# Patient Record
Sex: Female | Born: 1935 | Race: White | Hispanic: No | Marital: Married | Smoking: Never smoker
Health system: Southern US, Community
[De-identification: ages and names within clinical notes are randomized; demographics above are authoritative.]

## PROBLEM LIST (undated history)

## (undated) DIAGNOSIS — I340 Nonrheumatic mitral (valve) insufficiency: Secondary | ICD-10-CM

## (undated) DIAGNOSIS — I7300 Raynaud's syndrome without gangrene: Secondary | ICD-10-CM

## (undated) DIAGNOSIS — K219 Gastro-esophageal reflux disease without esophagitis: Secondary | ICD-10-CM

## (undated) DIAGNOSIS — R55 Syncope and collapse: Secondary | ICD-10-CM

## (undated) DIAGNOSIS — M8580 Other specified disorders of bone density and structure, unspecified site: Secondary | ICD-10-CM

## (undated) DIAGNOSIS — Z9289 Personal history of other medical treatment: Secondary | ICD-10-CM

## (undated) DIAGNOSIS — I4821 Permanent atrial fibrillation: Secondary | ICD-10-CM

## (undated) DIAGNOSIS — I509 Heart failure, unspecified: Secondary | ICD-10-CM

## (undated) DIAGNOSIS — F419 Anxiety disorder, unspecified: Secondary | ICD-10-CM

## (undated) DIAGNOSIS — K279 Peptic ulcer, site unspecified, unspecified as acute or chronic, without hemorrhage or perforation: Secondary | ICD-10-CM

## (undated) DIAGNOSIS — H905 Unspecified sensorineural hearing loss: Secondary | ICD-10-CM

## (undated) DIAGNOSIS — Z87440 Personal history of urinary (tract) infections: Secondary | ICD-10-CM

## (undated) DIAGNOSIS — K922 Gastrointestinal hemorrhage, unspecified: Secondary | ICD-10-CM

## (undated) DIAGNOSIS — I10 Essential (primary) hypertension: Secondary | ICD-10-CM

## (undated) DIAGNOSIS — N3941 Urge incontinence: Secondary | ICD-10-CM

## (undated) DIAGNOSIS — I2510 Atherosclerotic heart disease of native coronary artery without angina pectoris: Secondary | ICD-10-CM

## (undated) DIAGNOSIS — H9319 Tinnitus, unspecified ear: Secondary | ICD-10-CM

## (undated) DIAGNOSIS — H8109 Meniere's disease, unspecified ear: Secondary | ICD-10-CM

## (undated) DIAGNOSIS — K449 Diaphragmatic hernia without obstruction or gangrene: Secondary | ICD-10-CM

## (undated) DIAGNOSIS — E785 Hyperlipidemia, unspecified: Secondary | ICD-10-CM

## (undated) DIAGNOSIS — Z8719 Personal history of other diseases of the digestive system: Secondary | ICD-10-CM

## (undated) DIAGNOSIS — K5190 Ulcerative colitis, unspecified, without complications: Secondary | ICD-10-CM

## (undated) HISTORY — DX: Syncope and collapse: R55

## (undated) HISTORY — DX: Raynaud's syndrome without gangrene: I73.00

## (undated) HISTORY — DX: Ulcerative colitis, unspecified, without complications: K51.90

## (undated) HISTORY — DX: Gastro-esophageal reflux disease without esophagitis: K21.9

## (undated) HISTORY — DX: Tinnitus, unspecified ear: H93.19

## (undated) HISTORY — DX: Personal history of other medical treatment: Z92.89

## (undated) HISTORY — DX: Anxiety disorder, unspecified: F41.9

## (undated) HISTORY — DX: Urge incontinence: N39.41

## (undated) HISTORY — DX: Permanent atrial fibrillation: I48.21

## (undated) HISTORY — DX: Other specified disorders of bone density and structure, unspecified site: M85.80

## (undated) HISTORY — DX: Hyperlipidemia, unspecified: E78.5

## (undated) HISTORY — DX: Personal history of urinary (tract) infections: Z87.440

## (undated) HISTORY — DX: Personal history of other diseases of the digestive system: Z87.19

## (undated) HISTORY — PX: CARDIAC ELECTROPHYSIOLOGY STUDY AND ABLATION: SHX1294

## (undated) HISTORY — DX: Heart failure, unspecified: I50.9

## (undated) HISTORY — DX: Atherosclerotic heart disease of native coronary artery without angina pectoris: I25.10

## (undated) HISTORY — DX: Diaphragmatic hernia without obstruction or gangrene: K44.9

## (undated) HISTORY — DX: Unspecified sensorineural hearing loss: H90.5

## (undated) HISTORY — DX: Meniere's disease, unspecified ear: H81.09

## (undated) HISTORY — PX: MOHS SURGERY: SUR867

## (undated) HISTORY — DX: Peptic ulcer, site unspecified, unspecified as acute or chronic, without hemorrhage or perforation: K27.9

## (undated) HISTORY — DX: Essential (primary) hypertension: I10

## (undated) HISTORY — DX: Gastrointestinal hemorrhage, unspecified: K92.2

## (undated) HISTORY — PX: HEMORRHOID SURGERY: SHX153

## (undated) HISTORY — DX: Nonrheumatic mitral (valve) insufficiency: I34.0

---

## 1971-03-07 HISTORY — PX: APPENDECTOMY: SHX54

## 1971-03-07 HISTORY — PX: VAGINAL HYSTERECTOMY: SUR661

## 2008-03-06 HISTORY — PX: COLONOSCOPY: SHX174

## 2008-03-06 HISTORY — PX: CHOLECYSTECTOMY: SHX55

## 2010-03-06 DIAGNOSIS — K922 Gastrointestinal hemorrhage, unspecified: Secondary | ICD-10-CM

## 2010-03-06 HISTORY — DX: Gastrointestinal hemorrhage, unspecified: K92.2

## 2010-03-06 HISTORY — PX: UPPER GI ENDOSCOPY: SHX6162

## 2010-03-06 HISTORY — PX: LAPAROTOMY: SHX154

## 2012-03-06 HISTORY — PX: TRANSESOPHAGEAL ECHOCARDIOGRAM WITH CARDIOVERSION: SHX6140

## 2012-03-06 HISTORY — PX: CARDIAC CATHETERIZATION: SHX172

## 2012-09-28 ENCOUNTER — Emergency Department: Payer: Self-pay | Admitting: Emergency Medicine

## 2012-09-28 LAB — COMPREHENSIVE METABOLIC PANEL
Albumin: 3.8 g/dL (ref 3.4–5.0)
Alkaline Phosphatase: 71 U/L (ref 50–136)
Anion Gap: 7 (ref 7–16)
BUN: 24 mg/dL — ABNORMAL HIGH (ref 7–18)
Bilirubin,Total: 0.3 mg/dL (ref 0.2–1.0)
Calcium, Total: 8.8 mg/dL (ref 8.5–10.1)
Chloride: 109 mmol/L — ABNORMAL HIGH (ref 98–107)
Co2: 22 mmol/L (ref 21–32)
Creatinine: 1.55 mg/dL — ABNORMAL HIGH (ref 0.60–1.30)
EGFR (African American): 37 — ABNORMAL LOW
EGFR (Non-African Amer.): 32 — ABNORMAL LOW
Glucose: 132 mg/dL — ABNORMAL HIGH (ref 65–99)
Osmolality: 282 (ref 275–301)
Potassium: 4.4 mmol/L (ref 3.5–5.1)
SGOT(AST): 14 U/L — ABNORMAL LOW (ref 15–37)
SGPT (ALT): 22 U/L (ref 12–78)
Sodium: 138 mmol/L (ref 136–145)
Total Protein: 6.9 g/dL (ref 6.4–8.2)

## 2012-09-28 LAB — CBC
HCT: 33.3 % — ABNORMAL LOW (ref 35.0–47.0)
HGB: 10.6 g/dL — ABNORMAL LOW (ref 12.0–16.0)
MCH: 25.8 pg — ABNORMAL LOW (ref 26.0–34.0)
MCHC: 31.8 g/dL — ABNORMAL LOW (ref 32.0–36.0)
MCV: 81 fL (ref 80–100)
Platelet: 209 10*3/uL (ref 150–440)
RBC: 4.12 10*6/uL (ref 3.80–5.20)
RDW: 15.6 % — ABNORMAL HIGH (ref 11.5–14.5)
WBC: 7.1 10*3/uL (ref 3.6–11.0)

## 2012-09-28 LAB — PRO B NATRIURETIC PEPTIDE: B-Type Natriuretic Peptide: 4863 pg/mL — ABNORMAL HIGH (ref 0–450)

## 2012-09-28 LAB — URINALYSIS, COMPLETE
Bacteria: NONE SEEN
Bilirubin,UR: NEGATIVE
Blood: NEGATIVE
Glucose,UR: NEGATIVE mg/dL (ref 0–75)
Ketone: NEGATIVE
Leukocyte Esterase: NEGATIVE
Nitrite: NEGATIVE
Ph: 5 (ref 4.5–8.0)
Protein: NEGATIVE
RBC,UR: <1 /HPF (ref 0–5)
Specific Gravity: 1.003 (ref 1.003–1.030)
Squamous Epithelial: <1
WBC UR: NONE SEEN /HPF (ref 0–5)

## 2012-09-28 LAB — TROPONIN I: Troponin-I: <0.02 ng/mL

## 2013-07-25 ENCOUNTER — Encounter (INDEPENDENT_AMBULATORY_CARE_PROVIDER_SITE_OTHER): Payer: Self-pay

## 2013-07-25 ENCOUNTER — Encounter: Payer: Self-pay | Admitting: Cardiovascular Disease

## 2013-07-25 ENCOUNTER — Ambulatory Visit (INDEPENDENT_AMBULATORY_CARE_PROVIDER_SITE_OTHER): Payer: Medicare Other | Admitting: Cardiovascular Disease

## 2013-07-25 VITALS — BP 130/82 | HR 110 | Ht 68.5 in | Wt 148.0 lb

## 2013-07-25 DIAGNOSIS — Z9889 Other specified postprocedural states: Secondary | ICD-10-CM

## 2013-07-25 DIAGNOSIS — I509 Heart failure, unspecified: Secondary | ICD-10-CM

## 2013-07-25 DIAGNOSIS — I499 Cardiac arrhythmia, unspecified: Secondary | ICD-10-CM

## 2013-07-25 DIAGNOSIS — R0609 Other forms of dyspnea: Secondary | ICD-10-CM | POA: Insufficient documentation

## 2013-07-25 DIAGNOSIS — R0602 Shortness of breath: Secondary | ICD-10-CM

## 2013-07-25 DIAGNOSIS — Z8679 Personal history of other diseases of the circulatory system: Secondary | ICD-10-CM

## 2013-07-25 DIAGNOSIS — I498 Other specified cardiac arrhythmias: Secondary | ICD-10-CM | POA: Insufficient documentation

## 2013-07-25 DIAGNOSIS — I4891 Unspecified atrial fibrillation: Secondary | ICD-10-CM

## 2013-07-25 DIAGNOSIS — Z7901 Long term (current) use of anticoagulants: Secondary | ICD-10-CM

## 2013-07-25 DIAGNOSIS — I5032 Chronic diastolic (congestive) heart failure: Secondary | ICD-10-CM | POA: Insufficient documentation

## 2013-07-25 DIAGNOSIS — R0600 Dyspnea, unspecified: Secondary | ICD-10-CM

## 2013-07-25 MED ORDER — DILTIAZEM HCL 30 MG PO TABS: 30.0000 mg | ORAL_TABLET | Freq: Four times a day (QID) | ORAL | Status: DC | PRN

## 2013-07-25 MED ORDER — FUROSEMIDE 20 MG PO TABS: 20.0000 mg | ORAL_TABLET | Freq: Every day | ORAL | Status: DC | PRN

## 2013-07-25 NOTE — Assessment & Plan Note (Signed)
Prior ablation in October 2014

## 2013-07-25 NOTE — Progress Notes (Signed)
Patient ID: Sierra Kaiser, female    DOB: 27-Jan-1934, 80 y.o.   MRN: [id number]  HPI Comments: [name] is a 80 year old woman with a long history of atrial fibrillation, previously followed at [facility], presenting to establish care in the [place] office. She reports a long history of atrial fibrillation, trial on numerous medications. She has been on Tikosyn the past 3 years or so. Started 2012. Previous to that she was on Multaq.  She had atrial fibrillation ablation in October 2014.  Since that time, she has continued to have episodes of tachycardia arrhythmia, shortness of breath, palpitations. She takes metoprolol twice a day and her tikosyn. She was told to take extra metoprolol as needed for palpitations.  She does report remote history of syncope in 2013. Second episode March 2015 while she was out at dinner. She feels that these episodes were from low blood pressure. She has frequent episodes of shortness of breath particularly with exertion. She takes extra Lasix several days per week for shortness of breath symptoms  Lab work from [facility] 09/28/2012 showing creatinine 1.55, BUN 24 BNP 4800 EKG 09/28/2012 showing atrial fibrillation with RVR, rate 137 beats per minute followup EKG showing normal sinus rhythm with rate 82 beats per minute   Echocardiogram January 2011 was essentially normal. Repeat echocardiogram September 2014 again with normal LV function, normal study  Cardioversion may 2012 Cardioversion 12/06/2012 cardiac cath 05/17/2007 showing no significant CAD Stress test September 2014 showing no significant ischemia, normal ejection fraction EKG on today's visit showing normal sinus rhythm with frequent APCs, rare PVC    Outpatient Encounter Prescriptions as of 07/25/2013  Medication Sig  . Cyanocobalamin (VITAMIN B 12 PO) Take 1,000 mcg by mouth daily.  Marland Kitchen dofetilide (TIKOSYN) 250 MCG capsule Tae 250 mcg by mouth every 12 (twelve) hours.  . furosemide  (LASIX) 20 MG tablet Take 1 tablet (20 mg total) by mouth daily as needed.  . metoprolol succinate (TOPROL-XL) 25 MG 24 hr tablet Take 12.5 mg by mouth 2 (two) times daily.  Marland Kitchen omeprazole (PRILOSEC) 20 MG capsule Take 20 mg by mouth daily.  . Rivaroxaban (XARELTO) 15 MG TABS tablet Take 15 mg by mouth daily with supper.     Review of Systems  Constitutional: Negative.   HENT: Negative.   Eyes: Negative.   Respiratory: Positive for shortness of breath.   Cardiovascular: Positive for palpitations.  Gastrointestinal: Negative.   Endocrine: Negative.   Musculoskeletal: Negative.   Skin: Negative.   Allergic/Immunologic: Negative.   Neurological: Negative.   Hematological: Negative.   Psychiatric/Behavioral: Negative.   All other systems reviewed and are negative.   BP 130/82  Pulse 110  Ht 5' 8.5" (1.74 m)  Wt 148 lb (67.132 kg)  BMI 22.17 kg/m2  Physical Exam  Nursing note and vitals reviewed. Constitutional: She is oriented to person, place, and time. She appears well-developed and well-nourished.  HENT:  Head: Normocephalic.  Nose: Nose normal.  Mouth/Throat: Oropharynx is clear and moist.  Eyes: Conjunctivae are normal. Pupils are equal, round, and reactive to light.  Neck: Normal range of motion. Neck supple. No JVD present.  Cardiovascular: Normal rate, S1 normal, S2 normal, normal heart sounds and intact distal pulses.  An irregularly irregular rhythm present. Exam reveals no gallop and no friction rub.   No murmur heard. Pulmonary/Chest: Effort normal and breath sounds normal. No respiratory distress. She has no wheezes. She has no rales. She exhibits no tenderness.  Abdominal: Soft. Bowel sounds are  normal. She exhibits no distension. There is no tenderness.  Musculoskeletal: Normal range of motion. She exhibits no edema and no tenderness.  Lymphadenopathy:    She has no cervical adenopathy.  Neurological: She is alert and oriented to person, place, and time.  Coordination normal.  Skin: Skin is warm and dry. No rash noted. No erythema.  Psychiatric: She has a normal mood and affect. Her behavior is normal. Judgment and thought content normal.    Assessment and Plan

## 2013-07-25 NOTE — Assessment & Plan Note (Signed)
Tolerating anticoagulation with no side effects. No recent bleeding

## 2013-07-25 NOTE — Assessment & Plan Note (Signed)
Prior echocardiogram showing normal right ventricular systolic pressure. Normal ejection fraction. Suspect she could be overusing her Lasix for shortness of breath, we'll focus on heart rate control. Suggested she try to avoid Lasix for now

## 2013-07-25 NOTE — Assessment & Plan Note (Signed)
Long history of atrial fibrillation, ablation. We'll add diltiazem to her regimen. She reports that this worked well for her

## 2013-07-25 NOTE — Assessment & Plan Note (Signed)
Significant ectopy on today's visit. She is concerned about increasing her metoprolol higher given prior syncope episodes. We will start her on diltiazem 30 mg 3 times a day, continue low-dose metoprolol twice a day.

## 2013-07-25 NOTE — Assessment & Plan Note (Signed)
I suspect her shortness of breath could be from episodes of atrial fibrillation versus atrial tachycardia arrhythmia with rapid rate. Encouraged her to take extra diltiazem as needed for shortness of breath, avoid Lasix given prior syncope and renal dysfunction

## 2013-07-25 NOTE — Patient Instructions (Addendum)
Please start diltiazem three times a day COntinue metoprolol  Take extra diltiazem for tachycardia and shortness of breath  Monitor your heart rate and blood pressure  Try to avoid the lasix  Please call us if you have new issues that need to be addressed before your next appt.  Your physician wants you to follow-up in: 1 month We will set up an appt  with Dr. [name]

## 2013-07-26 ENCOUNTER — Telehealth: Payer: Self-pay | Admitting: Adult Health

## 2013-07-26 NOTE — Telephone Encounter (Signed)
Sierra Kaiser is a 80 year old female patient of Dr. Sherlean Foot seen in the [place] office. She was last seen in the office on 07/25/2013, with an elevated heart rate of 110 beats per minute. She has a history of atrial fibrillation, status post ablation of atrial fibrillation, on chronic anticoagulation, and diastolic heart failure.  On the office visit, the patient was started on diltiazem 30 mg every 6 hours as she did not wish to increase metoprolol as she had had episodes of syncope prior to this on higher doses. The patient has taken diltiazem as directed and has been experiencing itching of her hands on the palms and on the dorsal side. With what appears to be a rash. She took Benadryl, and also a anti-itch cream was applied for hands. She has continued complaints of this each time she takes diltiazem.  I asked her to stop taking the diltiazem. And increase her metoprolol to 25 mg twice a day (from 12.5 mg twice a day). She will take one dose this evening, and begin twice a day dosing tomorrow stopping diltiazem.  The patient has a followup with Dr. Sherlean Foot in one month and a previously scheduled appointment with Dr. [name] on 08/29/2013. She is to call if she has any recurrent symptoms, to include syncope which she states she experienced a beta blocker in the past. She appears to be tolerating the 12.5 mg twice a day now. She states her heart rate has been in the 90s to 100s on diltiazem .  She is to call back if she has any further problems. She verbalizes understanding.

## 2013-07-30 MED ORDER — VERAPAMIL HCL 40 MG PO TABS: 40.0000 mg | ORAL_TABLET | Freq: Three times a day (TID) | ORAL | Status: DC

## 2013-07-30 NOTE — Telephone Encounter (Signed)
Would try an alternate medication Stop the diltiazem Start verapamil 40 mg 3 times a day Continue low-dose metoprolol as she was taking previously

## 2013-07-30 NOTE — Addendum Note (Signed)
Addended by: Stana Bunting on: 07/30/2013 08:36 AM   Modules accepted: Orders, Medications

## 2013-07-30 NOTE — Telephone Encounter (Signed)
Spoke w/ pt.  Advised her of Dr. Gollan's recommendation. She is agreeable and will call w/ further questions or concerns.  

## 2013-08-29 ENCOUNTER — Encounter: Payer: Self-pay | Admitting: Cardiovascular Disease

## 2013-08-29 ENCOUNTER — Ambulatory Visit (INDEPENDENT_AMBULATORY_CARE_PROVIDER_SITE_OTHER): Payer: Medicare Other | Admitting: Cardiovascular Disease

## 2013-08-29 VITALS — BP 112/62 | HR 79 | Ht 68.0 in | Wt 148.5 lb

## 2013-08-29 DIAGNOSIS — I509 Heart failure, unspecified: Secondary | ICD-10-CM

## 2013-08-29 DIAGNOSIS — I4891 Unspecified atrial fibrillation: Secondary | ICD-10-CM

## 2013-08-29 DIAGNOSIS — I5032 Chronic diastolic (congestive) heart failure: Secondary | ICD-10-CM

## 2013-08-29 DIAGNOSIS — Z7901 Long term (current) use of anticoagulants: Secondary | ICD-10-CM

## 2013-08-29 NOTE — Assessment & Plan Note (Addendum)
Tolerating anticoagulation with no signs of bleeding

## 2013-08-29 NOTE — Assessment & Plan Note (Addendum)
Symptoms do not suggest recurrent atrial fibrillation. Long discussion about her medications. She would like to stop her Tikosyn. We have recommended if she does this that she continue on One-A-Day with slow wean down to none. Would continue on metoprolol and verapamil. Suggested she call us for recurrent atrial fibrillation

## 2013-08-29 NOTE — Progress Notes (Signed)
Patient ID: Sierra Kaiser, female    DOB: [birth year], 80 y.o.   MRN: [id number]  HPI Comments: Sierra Kaiser is a 80 year old woman with a long history of atrial fibrillation, previously followed at [facility],   long history of atrial fibrillation, trial on numerous medications. She has been on Tikosyn the past 3 years or so. Started 2012. Previous to that she was on Multaq.  She had atrial fibrillation ablation in October 2014. Previous episodes of syncope in 2013, March 2015. Possibly from low blood pressure  On her last clinic visit, verapamil 40 mg twice a day was started for tachycardia, palpitations, episodes of shortness of breath. Continued on metoprolol and tikosyn.  Since then, she reports her symptoms have significantly improved. She was unable to tolerate verapamil 3 pills per day as this dropped her blood pressure. In fact in review of her blood pressures, she has frequent systolic pressures in the 90s. Often feels lightheaded, fatigued. For the most part she is active, works in her garden. She does have poor sleep  She has frequent episodes of shortness of breath particularly with exertion. She takes extra Lasix several days per week for shortness of breath symptoms  Lab work from [facility] 09/28/2012 showing creatinine 1.55, BUN 24 BNP 4800 EKG 09/28/2012 showing atrial fibrillation with RVR, rate 137 beats per minute followup EKG showing normal sinus rhythm with rate 82 beats per minute   Echocardiogram January 2011 was essentially normal. Repeat echocardiogram September 2014 again with normal LV function, normal study  Cardioversion may 2012 Cardioversion 12/06/2012 cardiac cath 05/17/2007 showing no significant CAD Stress test September 2014 showing no significant ischemia, normal ejection fraction EKG on today's visit showing normal sinus rhythm with rate 79 beats per minute, with frequent APCs, rare PVC    Outpatient Encounter Prescriptions as of 08/29/2013  Medication  Sig  . Cyanocobalamin (VITAMIN B 12 PO) Take 1,000 mcg by mouth daily.  Marland Kitchen dofetilide (TIKOSYN) 250 MCG capsule Take 250 mcg by mouth every 12 (twelve) hours.  . furosemide (LASIX) 20 MG tablet Take 1 tablet (20 mg total) by mouth daily as needed.  . metoprolol succinate (TOPROL-XL) 25 MG 24 hr tablet Take 12.5 mg by mouth 2 (two) times daily.  Marland Kitchen omeprazole (PRILOSEC) 20 MG capsule Take 20 mg by mouth daily.  . Rivaroxaban (XARELTO) 15 MG TABS tablet Take 15 mg by mouth daily with supper.   . verapamil (CALAN) 40 MG tablet Take 40 mg by mouth 2 (two) times daily.  . [DISCONTINUED] verapamil (CALAN) 40 MG tablet Take 1 tablet (40 mg total) by mouth 3 (three) times daily.    Review of Systems  Constitutional: Negative.   HENT: Negative.   Eyes: Negative.   Respiratory: Negative.   Cardiovascular: Negative.   Gastrointestinal: Negative.   Endocrine: Negative.   Musculoskeletal: Negative.   Skin: Negative.   Allergic/Immunologic: Negative.   Neurological: Positive for light-headedness.  Hematological: Negative.   Psychiatric/Behavioral: Negative.   All other systems reviewed and are negative.   BP 112/62  Pulse 79  Ht 5\' 8"  (1.727 m)  Wt 148 lb 8 oz (67.359 kg)  BMI 22.58 kg/m2  Physical Exam  Nursing note and vitals reviewed. Constitutional: She is oriented to person, place, and time. She appears well-developed and well-nourished.  HENT:  Head: Normocephalic.  Nose: Nose normal.  Mouth/Throat: Oropharynx is clear and moist.  Eyes: Conjunctivae are normal. Pupils are equal, round, and reactive to light.  Neck: Normal range of motion.  Neck supple. No JVD present.  Cardiovascular: Normal rate, S1 normal, S2 normal, normal heart sounds and intact distal pulses.  An irregularly irregular rhythm present. Exam reveals no gallop and no friction rub.   No murmur heard. Pulmonary/Chest: Effort normal and breath sounds normal. No respiratory distress. She has no wheezes. She has no  rales. She exhibits no tenderness.  Abdominal: Soft. Bowel sounds are normal. She exhibits no distension. There is no tenderness.  Musculoskeletal: Normal range of motion. She exhibits no edema and no tenderness.  Lymphadenopathy:    She has no cervical adenopathy.  Neurological: She is alert and oriented to person, place, and time. Coordination normal.  Skin: Skin is warm and dry. No rash noted. No erythema.  Psychiatric: She has a normal mood and affect. Her behavior is normal. Judgment and thought content normal.    Assessment and Plan

## 2013-08-29 NOTE — Patient Instructions (Signed)
You are doing well.  Ok to hold tykosin if you would like (do a slow wean) Monitor your rhythm Call if you convert to atrial fibrillation  Please call us if you have new issues that need to be addressed before your next appt.  Your physician wants you to follow-up in: 3 months.  You will receive a reminder letter in the mail two months in advance. If you don't receive a letter, please call our office to schedule the follow-up appointment.

## 2013-08-29 NOTE — Assessment & Plan Note (Signed)
She appears to be relatively euvolemic, in fact with her low blood pressure encouraged her to increase her fluid intake. Frequent systolic pressures in the 90s

## 2013-09-04 ENCOUNTER — Other Ambulatory Visit: Payer: Self-pay

## 2013-09-04 MED ORDER — VERAPAMIL HCL 40 MG PO TABS: 40.0000 mg | ORAL_TABLET | Freq: Two times a day (BID) | ORAL | Status: DC

## 2013-09-12 ENCOUNTER — Telehealth: Payer: Self-pay | Admitting: *Deleted

## 2013-09-12 NOTE — Telephone Encounter (Signed)
Left message for pt to call back  °

## 2013-09-12 NOTE — Telephone Encounter (Signed)
Please call patient. Medication (derapamil)  was cut back and she is sob and out of rhythm. Does she need to add more of her medication?

## 2013-09-18 NOTE — Telephone Encounter (Signed)
Spoke w/ Sierra Kaiser.  She states that she did not get my message until Monday and has not had a chance to call me back.  She reports that she is out of rhythm all week, but her HR is wnl. She reports that she feels nervous, weak and unsure of herself.  Denies CP or SOB.  She would to know if she should have a referral to Dr. [name]. Advised her that she has an appt w/ Dr. [name] on 10/07/13.  She is agreeable and will keep this appt.  Sierra Kaiser to call w/ further questions of concerns before that time.

## 2013-10-07 ENCOUNTER — Encounter: Payer: Self-pay | Admitting: Internal Medicine

## 2013-10-07 ENCOUNTER — Ambulatory Visit (INDEPENDENT_AMBULATORY_CARE_PROVIDER_SITE_OTHER): Payer: Medicare Other | Admitting: Internal Medicine

## 2013-10-07 VITALS — BP 108/80 | HR 115 | Ht 68.0 in | Wt 147.5 lb

## 2013-10-07 DIAGNOSIS — I4891 Unspecified atrial fibrillation: Secondary | ICD-10-CM

## 2013-10-07 MED ORDER — DIGOXIN 125 MCG PO TABS: 0.1250 mg | ORAL_TABLET | Freq: Every day | ORAL | Status: DC

## 2013-10-07 NOTE — Progress Notes (Signed)
ELECTROPHYSIOLOGY CONSULT NOTE  Patient ID: Sierra Kaiser, MRN: [id number], DOB/AGE: 11-30-1933 80 y.o. Admit date: (Not on file) Date of Consult: 10/07/2013  Primary Physician: Bari Edward, MD Primary Cardiologist: *TG  Chief Complaint: ATRIAL FIBRILLATION   HPI Sierra Kaiser is a 80 y.o. female  Referred because of long-standing problem of atrial fibrillation previously followed at [facility]. She actually underwent catheter ablation October 2014. At that time LV function was mildly decreased at 50%. She had moderate MR. She had recurrent atrial fibrillation following the ablation. She is not been seen at [facility] since January 2015. She has been on dofetilide for some time both before and after ablation. She had previously failed dronaderone.  She does not recall any other antiarrhythmic therapy. Her are  She has continued to have problems with tachycardia palpitations; beta blockers were used in conjunction with her dofetilide; recent attempts to add verapamil more challenging because of hypotension. It should be noted that she's had a history of recurrent syncope attributed to low blood pressure.   She has significant problems with exercise intolerance which are variable. It is not clear to her that we associate with her episodes of detected rapid heart rate although she notes her heart rates just once a day and at least today for example she is in regular rhythm the morning in atrial fibrillation later.  She has not had peripheral edema; she does not have nocturnal dyspnea.  Thromboembolic risk factors are notable for age and gender. She is on Rivaroxaban  Apparently dosed for her renal function one year ago   ; catheterization 2009 demonstrated no significant coronary disease. Stress testing 2014 demonstrated no ischemia.  Past Medical History  Diagnosis Date  . A-fib   . Arrhythmia     A-Fib  . CHF (congestive heart failure)   . Hypertension   . Hyperlipidemia   . Raynaud's  disease   . Meniere's disease   . Diverticulitis large intestine   . GI bleeding   . Anxiety   . Urinary incontinence, urge   . Bradycardia   . Colitis   . CKD (chronic kidney disease)   . SNHL (sensorineural hearing loss)   . Tinnitus       Surgical History:  Past Surgical History  Procedure Laterality Date  . Cardiac electrophysiology study and ablation    . Transesophageal echocardiogram with cardioversion      [facility]  . Gallbladder surgery    . Vaginal hysterectomy    . Hemorrhoid surgery    . Upper gi endoscopy    . Appendectomy    . Cardiac catheterization  2014    [facility]     Home Meds: Prior to Admission medications   Medication Sig Start Date End Date Taking? Authorizing Provider  furosemide (LASIX) 20 MG tablet Take 1 tablet (20 mg total) by mouth daily as needed. 07/25/13  Yes Minna Merritts, MD  metoprolol succinate (TOPROL-XL) 25 MG 24 hr tablet Take 12.5 mg by mouth 2 (two) times daily.   Yes Historical Provider, MD  omeprazole (PRILOSEC) 20 MG capsule Take 20 mg by mouth as needed.    Yes Historical Provider, MD  Rivaroxaban (XARELTO) 15 MG TABS tablet Take 15 mg by mouth daily with supper.    Yes Historical Provider, MD  verapamil (CALAN) 40 MG tablet Take 1 tablet (40 mg total) by mouth 2 (two) times daily. 09/04/13  Yes Minna Merritts, MD  Cyanocobalamin (VITAMIN B 12 PO) Take 1,000 mcg by mouth  daily.    Historical Provider, MD    Inpatient Medications:    Allergies:  Allergies  Allergen Reactions  . Ciprofloxacin   . Ivp Dye [Iodinated Diagnostic Agents]     Hives    . Metronidazole     History   Social History  . Marital Status: Married    Spouse Name: N/A    Number of Children: N/A  . Years of Education: N/A   Occupational History  . Not on file.   Social History Main Topics  . Smoking status: Never Smoker   . Smokeless tobacco: Not on file  . Alcohol Use: 1.2 oz/week    2 Glasses of wine per week  . Drug Use: No  . Sexual Activity:  Not on file   Other Topics Concern  . Not on file   Social History Narrative  . No narrative on file     Family History  Problem Relation Age of Onset  . Arrhythmia Mother   . Hypertension Mother   . Heart failure Brother   . Heart failure Brother      ROS:  Please see the history of present illness.     All other systems reviewed and negative.    Physical Exam: Blood pressure 108/80, pulse 115, height 5\' 8"  (1.727 m), weight 147 lb 8 oz (66.906 kg). General: Well developed, well nourished female in no acute distress. Head: Normocephalic, atraumatic, sclera non-icteric, no xanthomas, nares are without discharge. EENT: normal Lymph Nodes:  none Back: without scoliosis/kyphosis , no CVA tendersness Neck: Negative for carotid bruits. JVD not elevated.  Lungs: Clear bilaterally to auscultation without wheezes, rales, or rhonchi. Breathing is unlabored. Heart:rapid and irregularly irregular rhythm without murmur , rubs, or gallops appreciated. Abdomen: Soft, non-tender, non-distended with normoactive bowel sounds. No hepatomegaly. No rebound/guarding. No obvious abdominal masses. Msk:  Strength and tone appear normal for age. Extremities: No clubbing or cyanosis. No  edema.  Distal pedal pulses are 2+ and equal bilaterally. Skin: Warm and Dry Neuro: Alert and oriented X 3. CN III-XII intact Grossly normal sensory and motor function . Psych:  Responds to questions appropriately with a normal affect.      Labs: Cardiac Enzymes No results found for this basename: CKTOTAL, CKMB, TROPONINI,  in the last 72 hours  Radiology/Studies:  No results found.  EKG: atrial fibrillation 114 Intervals-/09/32  Assessment and Plan: *  Atrial fibrillation-rapid ventricular response  HFpEF  Renal insufficiency grade 3  Mitral regurgitation-moderate  Cardiomyopathy-mild  Hypotension  The patient has persistent problems with rapid atrial fibrillation. She has some paroxysms of  sinus rhythm. She had, in the fall, evidence of some degree of cardiomyopathy; this may well have progressed and not dictate a more conclusive approach to her problem.  We spent a long time today discussing strategies of rate control versus rhythm control. We'll undertake an echo to look at left ventricular function and left atrial size. Thereafter we will gather to discuss the use of amiodarone as a rhythm controlling agent were to consider AV junction ablation and pacing or alternatively a repeat PVI  Her anticoagulant does appropriately dosed for renal function.  She was noted to have mitral regurgitation on her last echo; we will reassess this at this time   Virl Axe

## 2013-10-07 NOTE — Patient Instructions (Addendum)
Your physician has requested that you have an echocardiogram. Echocardiography is a painless test that uses sound waves to create images of your heart. It provides your doctor with information about the size and shape of your heart and how well your heart's chambers and valves are working. This procedure takes approximately one hour. There are no restrictions for this procedure.   Your physician recommends that you schedule a follow-up appointment in:  Dr. [name] on 10/21/13  Your physician has recommended you make the following change in your medication:  Start Digoxin 0.125 mg once daily  Do not adjust your Metoprolol dose   Your physician recommends that you have labs today: BMP

## 2013-10-08 LAB — BASIC METABOLIC PANEL
BUN/Creatinine Ratio: 19 (ref 11–26)
BUN: 25 mg/dL (ref 8–27)
CO2: 23 mmol/L (ref 18–29)
Calcium: 10.1 mg/dL (ref 8.7–10.3)
Chloride: 105 mmol/L (ref 97–108)
Creatinine, Ser: 1.29 mg/dL — ABNORMAL HIGH (ref 0.57–1.00)
GFR calc Af Amer: 46 mL/min/{1.73_m2} — ABNORMAL LOW (ref >59)
GFR calc non Af Amer: 40 mL/min/{1.73_m2} — ABNORMAL LOW (ref >59)
Glucose: 86 mg/dL (ref 65–99)
Potassium: 4.9 mmol/L (ref 3.5–5.2)
Sodium: 142 mmol/L (ref 134–144)

## 2013-10-14 ENCOUNTER — Other Ambulatory Visit (INDEPENDENT_AMBULATORY_CARE_PROVIDER_SITE_OTHER): Payer: Medicare Other

## 2013-10-14 ENCOUNTER — Other Ambulatory Visit: Payer: Self-pay

## 2013-10-14 DIAGNOSIS — I4891 Unspecified atrial fibrillation: Secondary | ICD-10-CM

## 2013-10-14 DIAGNOSIS — I059 Rheumatic mitral valve disease, unspecified: Secondary | ICD-10-CM

## 2013-10-17 ENCOUNTER — Telehealth: Payer: Self-pay | Admitting: *Deleted

## 2013-10-17 NOTE — Telephone Encounter (Signed)
pt notified lab results, verbal understanding. Pt then states c/o dizziness, presyncope, weakness, shakey, sob on days when she takes digoxin 0.125 mg. Pt was on digoxin daily, said Dr. [name] changed to QOD. Chest tight yesterday. Will have Triage cb pt.

## 2013-10-17 NOTE — Telephone Encounter (Signed)
pt states BP running low, 8/12 9:15 am 89/48 HR 62; 8/12 5 pm 83/53 HR 81 today 8/14 86/56 HR 56.

## 2013-10-17 NOTE — Telephone Encounter (Signed)
**Note De-Identified  Obfuscation** The pt is advised that due to her s/s, chest tightness and low BP she needs to call 911 or have someone drive her to the ER. The pt states that  she is feeling much better now since she sat down and has been resting. She has an appointment scheduled with Dr [name] on 8/18 and she wants to wait and see him. I again strongly encouraged her to call 911 but she states that if she starts to feel bad enough she will go to the ER but otherwise she will wait to see Dr [name] on Tuesday as she states she is feeling better. Will forward note to Dr [name] as Juluis Rainier.

## 2013-10-17 NOTE — Telephone Encounter (Signed)
lmom to advise pt to go to the ED due to BP too low along with other symptoms of dizziness, shakey, sob, weakness.

## 2013-10-21 ENCOUNTER — Ambulatory Visit (INDEPENDENT_AMBULATORY_CARE_PROVIDER_SITE_OTHER): Payer: Medicare Other | Admitting: Internal Medicine

## 2013-10-21 ENCOUNTER — Encounter: Payer: Self-pay | Admitting: Internal Medicine

## 2013-10-21 VITALS — BP 100/60 | HR 78 | Ht 68.0 in | Wt 149.2 lb

## 2013-10-21 DIAGNOSIS — I4891 Unspecified atrial fibrillation: Secondary | ICD-10-CM

## 2013-10-21 DIAGNOSIS — I4819 Other persistent atrial fibrillation: Secondary | ICD-10-CM

## 2013-10-21 MED ORDER — AMIODARONE HCL 200 MG PO TABS: ORAL_TABLET | ORAL | Status: DC

## 2013-10-21 NOTE — Patient Instructions (Addendum)
Your physician has recommended you make the following change in your medication:  Stop Metoprolol  Stop Verapamil  Start Amiodarone as ordered   Your physician has recommended that you have a Cardioversion (DCCV) we will call you with date and time. Electrical Cardioversion uses a jolt of electricity to your heart either through paddles or wired patches attached to your chest. This is a controlled, usually prescheduled, procedure. Defibrillation is done under light anesthesia in the hospital, and you usually go home the day of the procedure. This is done to get your heart back into a normal rhythm. You are not awake for the procedure. Please see the instruction sheet given to you today.  Your physician wants you to follow-up in: 10-12 weeks. You will receive a reminder letter in the mail two months in advance. If you don't receive a letter, please call our office to schedule the follow-up appointment.  Your physician recommends that you return for lab work and ekg in:  Week of 8/31 CMP TSH Digoxin Level  INR CBC

## 2013-10-21 NOTE — Progress Notes (Signed)
Patient Care Team: Bari Edward, MD as PCP - General (Internal Medicine)   HPI  Sierra Kaiser is a 80 y.o. female Seen in followup for atrial fibrillation. She undergone pulmonary vein ablation at [facility] 10/14. She's been treated with dofetilide. She underwent cardioversion 3/15   Rate control medications were challenge because of hypotension. She had in fact syncope attributed to low blood pressure.  We   started her on low-dose digoxin. She called with complaints Last week   with symptoms of hypotension and blood pressures in the mid 80s.   It is her impression that she feels better with higher heart rates and higher blood pressure.  Renal function GFR of about 40  She has variable exercise tolerance potentially not clearly associated with paroxysms of atrial fibrillation  An echocardiogram 8/15 demonstrated normal left ventricular functionand mild LAE  (43/2.4)  Past Medical History  Diagnosis Date  . A-fib   . Arrhythmia     A-Fib  . CHF (congestive heart failure)   . Hypertension   . Hyperlipidemia   . Raynaud's disease   . Meniere's disease   . Diverticulitis large intestine   . GI bleeding   . Anxiety   . Urinary incontinence, urge   . Bradycardia   . Colitis   . CKD (chronic kidney disease)   . SNHL (sensorineural hearing loss)   . Tinnitus     Past Surgical History  Procedure Laterality Date  . Cardiac electrophysiology study and ablation    . Transesophageal echocardiogram with cardioversion      [facility]  . Gallbladder surgery    . Vaginal hysterectomy    . Hemorrhoid surgery    . Upper gi endoscopy    . Appendectomy    . Cardiac catheterization  2014    [facility]    Current Outpatient Prescriptions  Medication Sig Dispense Refill  . Cyanocobalamin (VITAMIN B 12 PO) Take 1,000 mcg by mouth daily.      . digoxin (LANOXIN) 0.125 MG tablet Take by mouth every other day.       . furosemide (LASIX) 20 MG tablet Take 1 tablet (20 mg total) by  mouth daily as needed.  30 tablet  6  . metoprolol succinate (TOPROL-XL) 25 MG 24 hr tablet Take 12.5 mg by mouth 2 (two) times daily.      Marland Kitchen omeprazole (PRILOSEC) 20 MG capsule Take 20 mg by mouth as needed.       . Rivaroxaban (XARELTO) 15 MG TABS tablet Take 15 mg by mouth daily with supper.       . verapamil (CALAN) 40 MG tablet Take 1 tablet (40 mg total) by mouth 2 (two) times daily.  60 tablet  6   No current facility-administered medications for this visit.    Allergies  Allergen Reactions  . Ciprofloxacin   . Contrast Media [Iodinated Diagnostic Agents]     Hives    . Diltiazem Hcl   . Metronidazole     Review of Systems negative except from HPI and PMH  Physical Exam BP 100/60  Pulse 78  Ht 5\' 8"  (1.727 m)  Wt 149 lb 4 oz (67.699 kg)  BMI 22.70 kg/m2 Well developed and well nourished in no acute distress HENT normal E scleral and icterus clear Neck Supple JVP flat; carotids brisk and full Clear to ausculation Irreegular rate and rhythm, no murmurs gallops or rub Soft with active bowel sounds No clubbing cyanosis  Edema Alert and  oriented, grossly normal motor and sensory function Skin Warm and Dry  ECG demonstrates atrial fibrillation at 78   Assessment and  Plan  Atrial fibrillation-persistent  Hypotension  Iodinated contrast reaction  We will begin her on amiodarone. The literature suggests that there is no cross-reactivity between amiodarone and iodine contrast reactions. This will allow [place] to discontinue the beta blocker and verapamil as well as to address her hypotension. We will anticipate cardioversion in 2-3 weeks. Thereafter we will make a decision regarding repeat ablation for atrial fibrillation substrate.  We have reviewed amiodarone side effects. Will anticipate a digoxin level in 2 weeks time.

## 2013-10-27 ENCOUNTER — Telehealth: Payer: Self-pay | Admitting: *Deleted

## 2013-10-27 ENCOUNTER — Encounter: Payer: Self-pay | Admitting: *Deleted

## 2013-10-27 DIAGNOSIS — I4819 Other persistent atrial fibrillation: Secondary | ICD-10-CM

## 2013-10-27 DIAGNOSIS — Z01812 Encounter for preprocedural laboratory examination: Secondary | ICD-10-CM

## 2013-10-27 NOTE — Telephone Encounter (Signed)
Cardioversion scheduled for 11/12/13 at [place] instruction letter with patient  Patient verbalized understanding  Letter left at desk for patient to pick up on her lab day

## 2013-11-03 ENCOUNTER — Ambulatory Visit: Payer: Medicare Other | Admitting: *Deleted

## 2013-11-03 ENCOUNTER — Ambulatory Visit (INDEPENDENT_AMBULATORY_CARE_PROVIDER_SITE_OTHER): Payer: Medicare Other

## 2013-11-03 VITALS — BP 120/60 | HR 76 | Ht 68.0 in | Wt 148.8 lb

## 2013-11-03 DIAGNOSIS — I4891 Unspecified atrial fibrillation: Secondary | ICD-10-CM

## 2013-11-03 DIAGNOSIS — I4819 Other persistent atrial fibrillation: Secondary | ICD-10-CM

## 2013-11-03 DIAGNOSIS — Z01812 Encounter for preprocedural laboratory examination: Secondary | ICD-10-CM

## 2013-11-04 ENCOUNTER — Telehealth: Payer: Self-pay | Admitting: *Deleted

## 2013-11-04 ENCOUNTER — Emergency Department: Payer: Self-pay | Admitting: Emergency Medicine

## 2013-11-04 LAB — CBC WITH DIFFERENTIAL
Basophils Absolute: 0 10*3/uL (ref 0.0–0.2)
Basos: 1 %
Eos: 3 %
Eosinophils Absolute: 0.2 10*3/uL (ref 0.0–0.4)
HCT: 37.4 % (ref 34.0–46.6)
Hemoglobin: 12.7 g/dL (ref 11.1–15.9)
Immature Grans (Abs): 0 10*3/uL (ref 0.0–0.1)
Immature Granulocytes: 0 %
Lymphocytes Absolute: 1.8 10*3/uL (ref 0.7–3.1)
Lymphs: 30 %
MCH: 29.6 pg (ref 26.6–33.0)
MCHC: 34 g/dL (ref 31.5–35.7)
MCV: 87 fL (ref 79–97)
Monocytes Absolute: 0.4 10*3/uL (ref 0.1–0.9)
Monocytes: 7 %
Neutrophils Absolute: 3.5 10*3/uL (ref 1.4–7.0)
Neutrophils Relative %: 59 %
Platelets: 237 10*3/uL (ref 150–379)
RBC: 4.29 x10E6/uL (ref 3.77–5.28)
RDW: 14.7 % (ref 12.3–15.4)
WBC: 5.9 10*3/uL (ref 3.4–10.8)

## 2013-11-04 LAB — CBC
HCT: 39.3 % (ref 35.0–47.0)
HGB: 12.9 g/dL (ref 12.0–16.0)
MCH: 29.8 pg (ref 26.0–34.0)
MCHC: 32.9 g/dL (ref 32.0–36.0)
MCV: 91 fL (ref 80–100)
Platelet: 225 10*3/uL (ref 150–440)
RBC: 4.33 10*6/uL (ref 3.80–5.20)
RDW: 14.2 % (ref 11.5–14.5)
WBC: 7.1 10*3/uL (ref 3.6–11.0)

## 2013-11-04 LAB — BASIC METABOLIC PANEL
Anion Gap: 7 (ref 7–16)
BUN: 24 mg/dL — ABNORMAL HIGH (ref 7–18)
Calcium, Total: 9.5 mg/dL (ref 8.5–10.1)
Chloride: 112 mmol/L — ABNORMAL HIGH (ref 98–107)
Co2: 22 mmol/L (ref 21–32)
Creatinine: 1.61 mg/dL — ABNORMAL HIGH (ref 0.60–1.30)
EGFR (African American): 35 — ABNORMAL LOW
EGFR (Non-African Amer.): 30 — ABNORMAL LOW
Glucose: 114 mg/dL — ABNORMAL HIGH (ref 65–99)
Osmolality: 286 (ref 275–301)
Potassium: 4.3 mmol/L (ref 3.5–5.1)
Sodium: 141 mmol/L (ref 136–145)

## 2013-11-04 LAB — COMPREHENSIVE METABOLIC PANEL
ALT: 11 IU/L (ref 0–32)
AST: 16 IU/L (ref 0–40)
Albumin/Globulin Ratio: 2.1 (ref 1.1–2.5)
Albumin: 4.4 g/dL (ref 3.5–4.8)
Alkaline Phosphatase: 61 IU/L (ref 39–117)
BUN/Creatinine Ratio: 16 (ref 11–26)
BUN: 28 mg/dL — ABNORMAL HIGH (ref 8–27)
CO2: 20 mmol/L (ref 18–29)
Calcium: 9.5 mg/dL (ref 8.7–10.3)
Chloride: 106 mmol/L (ref 97–108)
Creatinine, Ser: 1.73 mg/dL — ABNORMAL HIGH (ref 0.57–1.00)
GFR calc Af Amer: 32 mL/min/{1.73_m2} — ABNORMAL LOW (ref >59)
GFR calc non Af Amer: 28 mL/min/{1.73_m2} — ABNORMAL LOW (ref >59)
Globulin, Total: 2.1 g/dL (ref 1.5–4.5)
Glucose: 111 mg/dL — ABNORMAL HIGH (ref 65–99)
Potassium: 6 mmol/L — ABNORMAL HIGH (ref 3.5–5.2)
Sodium: 144 mmol/L (ref 134–144)
Total Bilirubin: 0.3 mg/dL (ref 0.0–1.2)
Total Protein: 6.5 g/dL (ref 6.0–8.5)

## 2013-11-04 LAB — TSH
TSH: 7.26 u[IU]/mL — ABNORMAL HIGH (ref 0.450–4.500)
Thyroid Stimulating Horm: 4.98 u[IU]/mL — ABNORMAL HIGH

## 2013-11-04 LAB — PROTIME-INR
INR: 1.3 — ABNORMAL HIGH (ref 0.8–1.2)
Prothrombin Time: 13.9 s — ABNORMAL HIGH (ref 9.1–12.0)

## 2013-11-04 LAB — DIGOXIN LEVEL: Digoxin Level: 1.3 ng/mL (ref 0.9–2.0)

## 2013-11-04 NOTE — Telephone Encounter (Signed)
Error

## 2013-11-04 NOTE — Telephone Encounter (Signed)
Spoke with Dr. [name] via telephone  He reccommended patient go to the ED and have a BMP, free T3 and free T4   Spoke with patient she verbalized understanding and is heading to the ED now    Spoke with Marya Amsler [employer] crew in [facility] ED)  Informed him of the situation  He verbalized understanding

## 2013-11-04 NOTE — Telephone Encounter (Signed)
Message copied by Tracie Harrier on Tue Nov 04, 2013  4:58 PM ------      Message from: Stanton Kidney      Created: Tue Nov 04, 2013  1:43 PM       Per Dr. [name] - have lab redrawn.      Notified Elmyra Ricks, RN in [place] via phone ------

## 2013-11-04 NOTE — Telephone Encounter (Signed)
During lab visit yesterday patient stated she has not felt well after start Amiodarone  She stated she was nevous, itching and having sweating spells   Discussed these issues with Dr.Arida  Per Dr. Fletcher Anon symptoms could improve when Amiodarone is decreased Patient decreased dose 11/03/13   Instructed patient to call if symptoms continued or worsened Instructed her to contact EMS if she feels her situation becomes emergent  Patient verbalized understanding

## 2013-11-05 ENCOUNTER — Telehealth: Payer: Self-pay | Admitting: *Deleted

## 2013-11-05 NOTE — Telephone Encounter (Signed)
Attempted to call patient to schedule appt to follow up on labs and amiodarone side effects

## 2013-11-06 NOTE — Telephone Encounter (Signed)
Patient returned call  Appt scheduled with Dr. [name]

## 2013-11-07 ENCOUNTER — Ambulatory Visit (INDEPENDENT_AMBULATORY_CARE_PROVIDER_SITE_OTHER): Payer: Medicare Other | Admitting: Internal Medicine

## 2013-11-07 ENCOUNTER — Encounter: Payer: Self-pay | Admitting: Internal Medicine

## 2013-11-07 VITALS — BP 138/78 | HR 77 | Ht 68.0 in | Wt 149.8 lb

## 2013-11-07 DIAGNOSIS — I4891 Unspecified atrial fibrillation: Secondary | ICD-10-CM

## 2013-11-07 DIAGNOSIS — I4819 Other persistent atrial fibrillation: Secondary | ICD-10-CM

## 2013-11-07 DIAGNOSIS — T46901A Poisoning by unspecified agents primarily affecting the cardiovascular system, accidental (unintentional), initial encounter: Secondary | ICD-10-CM

## 2013-11-07 DIAGNOSIS — T462X1A Poisoning by other antidysrhythmic drugs, accidental (unintentional), initial encounter: Secondary | ICD-10-CM

## 2013-11-07 MED ORDER — AMIODARONE HCL 200 MG PO TABS: 200.0000 mg | ORAL_TABLET | Freq: Every day | ORAL | Status: DC

## 2013-11-07 NOTE — Progress Notes (Signed)
Patient Care Team: Bari Edward, MD as PCP - General (Internal Medicine)   HPI  Sierra Kaiser is a 80 y.o. female Seen in followup for atrial fibrillation. She underwent pulmonary vein ablation at [facility] 10/14. She's been treated with dofetilide. She underwent cardioversion 3/15  Rate control medications were challenge because of hypotension. She had in fact syncope attributed to low blood pressure.    At the last visit she was started on amio having previously been started on dig.  Level was 1.3  >BUNCr also >>28/1.7 GFR 28 <<40  She has been having problems with jitters and overall sense of malaise since starting the amiodarone,  This is somewhat better with the decreased dose 400 daily  DCCV  Scheduled next week  TSH just above ULN She has variable exercise tolerance potentially not clearly associated with paroxysms of atrial fibrillation  An echocardiogram 8/15 demonstrated normal left ventricular functionand mild LAE  (43/2.4)  Past Medical History  Diagnosis Date  . A-fib   . CHF (congestive heart failure)   . Hypotension   . Hyperlipidemia   . Raynaud's disease   . Meniere's disease   . Diverticulitis large intestine   . GI bleeding   . Anxiety   . Urinary incontinence, urge   . Bradycardia   . Colitis   . CKD (chronic kidney disease)   . SNHL (sensorineural hearing loss)   . Tinnitus     Past Surgical History  Procedure Laterality Date  . Cardiac electrophysiology study and ablation    . Transesophageal echocardiogram with cardioversion      [facility]  . Gallbladder surgery    . Vaginal hysterectomy    . Hemorrhoid surgery    . Upper gi endoscopy    . Appendectomy    . Cardiac catheterization  2014    [facility]    Current Outpatient Prescriptions  Medication Sig Dispense Refill  . amiodarone (PACERONE) 200 MG tablet Take 400 mg twice daily for 2 weeks. Then take 400 mg daily for 2 weeks. Then take 200 mg daily.  180 tablet  3  . BIOTIN PO  Take by mouth daily.      . Cyanocobalamin (VITAMIN B 12 PO) Take 1,000 mcg by mouth daily.      . digoxin (LANOXIN) 0.125 MG tablet Take by mouth every other day.       . furosemide (LASIX) 20 MG tablet Take 1 tablet (20 mg total) by mouth daily as needed.  30 tablet  6  . omeprazole (PRILOSEC) 20 MG capsule Take 20 mg by mouth as needed.       . Rivaroxaban (XARELTO) 15 MG TABS tablet Take 15 mg by mouth daily with supper.        No current facility-administered medications for this visit.    Allergies  Allergen Reactions  . Ciprofloxacin   . Contrast Media [Iodinated Diagnostic Agents]     Hives    . Diltiazem Hcl   . Metronidazole     Review of Systems negative except from HPI and PMH  Physical Exam Ht 5\' 8"  (1.727 m)  Wt 149 lb 12 oz (67.926 kg)  BMI 22.77 kg/m2 Well developed and well nourished in no acute distress HENT normal E scleral and icterus clear Neck Supple JVP flat; carotids brisk and full Clear to ausculation Irreegular rate and rhythm, no murmurs gallops or rub Soft with active bowel sounds No clubbing cyanosis  Edema Alert and oriented, grossly  normal motor and sensory function Skin Warm and Dry  ECG demonstrates atrial fibrillation at 78   Assessment and  Plan  Atrial fibrillation-persistent  Hypotension  Amiodarone neurotoxicity  Iodinated contrast reaction  She will undergo DCCV next week Dig levels >1 and decreased to qod rreviewed amio neuro toxicity assoc with dose and hopefully iwll improve at 200 and NSR  If not will consider repeat PVI v AV nodal ablation at next visit

## 2013-11-07 NOTE — Patient Instructions (Addendum)
Your physician has recommended you make the following change in your medication:  Decrease Amiodarone to 200 mg once daily   Your physician recommends that you schedule a follow-up appointment in:  3-4 weeks after your procedure on 11/12/13

## 2013-11-11 ENCOUNTER — Telehealth: Payer: Self-pay | Admitting: *Deleted

## 2013-11-11 NOTE — Telephone Encounter (Signed)
Cardioversion orders faxed  Juliann Pulse confirmed rect

## 2013-11-12 ENCOUNTER — Ambulatory Visit: Payer: Self-pay | Admitting: Cardiovascular Disease

## 2013-11-12 ENCOUNTER — Encounter: Payer: Self-pay | Admitting: Internal Medicine

## 2013-11-12 DIAGNOSIS — I4891 Unspecified atrial fibrillation: Secondary | ICD-10-CM

## 2013-11-28 ENCOUNTER — Encounter: Payer: Self-pay | Admitting: Cardiovascular Disease

## 2013-11-28 ENCOUNTER — Ambulatory Visit (INDEPENDENT_AMBULATORY_CARE_PROVIDER_SITE_OTHER): Payer: Medicare Other | Admitting: Cardiovascular Disease

## 2013-11-28 VITALS — BP 122/74 | HR 87 | Ht 68.0 in | Wt 149.2 lb

## 2013-11-28 DIAGNOSIS — Z7901 Long term (current) use of anticoagulants: Secondary | ICD-10-CM

## 2013-11-28 DIAGNOSIS — I4891 Unspecified atrial fibrillation: Secondary | ICD-10-CM

## 2013-11-28 DIAGNOSIS — R0602 Shortness of breath: Secondary | ICD-10-CM

## 2013-11-28 DIAGNOSIS — I5032 Chronic diastolic (congestive) heart failure: Secondary | ICD-10-CM

## 2013-11-28 DIAGNOSIS — I509 Heart failure, unspecified: Secondary | ICD-10-CM

## 2013-11-28 NOTE — Progress Notes (Signed)
Patient ID: Sierra Kaiser, female    DOB: Aug 12, 1933, 80 y.o.   MRN: [id number]  HPI Comments: [name] is a 80 year old woman with a long history of atrial fibrillation, previously followed at [facility],   long history of atrial fibrillation, trial on numerous medications. She has been on Tikosyn the past 3 years or so. Started 2012. Previous to that she was on Multaq.  She had atrial fibrillation ablation in October 2014. Previous episodes of syncope in 2013, March 2015. Possibly from low blood pressure She has had trouble tolerating verapamil in the past secondary to hypotension Chronic renal insufficiency  Previously on metoprolol and tikosyn. Changed to amiodarone in preparation for cardioversion She underwent cardioversion 11/12/2013 which was successful but she reports going back to atrial fibrillation the next day despite continuing on her amiodarone In followup today, she reports that she feels very jittery inside secondary to the amiodarone. She has never felt this way in the past 5 years on all of the other antiarrhythmic medications. At this time she's not interested in further ablation. She does have shortness of breath. She's not taking her metoprolol. She denies any significant lower extremity edema. She's not measuring her heart rate. She does continue to take Lasix periodically for leg edema  Lab work from [facility] 09/28/2012 showing creatinine 1.55, BUN 24 BNP 4800 EKG 09/28/2012 showing atrial fibrillation with RVR, rate 137 beats per minute followup EKG showing normal sinus rhythm with rate 82 beats per minute   Echocardiogram January 2011 was essentially normal. Repeat echocardiogram September 2014 again with normal LV function, normal study  Cardioversion may 2012 Cardioversion 12/06/2012 cardiac cath 05/17/2007 showing no significant CAD Stress test September 2014 showing no significant ischemia, normal ejection fraction  EKG on today's visit showing atrial  fibrillation with rate 87 beats per minute    Outpatient Encounter Prescriptions as of 11/28/2013  Medication Sig  . amiodarone (PACERONE) 200 MG tablet Take 1 tablet (200 mg total) by mouth daily.  Marland Kitchen BIOTIN PO Take by mouth daily.  . Cyanocobalamin (VITAMIN B 12 PO) Take 1,000 mcg by mouth daily.  . digoxin (LANOXIN) 0.125 MG tablet Take by mouth every other day.   . furosemide (LASIX) 20 MG tablet Take 1 tablet (20 mg total) by mouth daily as needed.  Marland Kitchen omeprazole (PRILOSEC) 20 MG capsule Take 20 mg by mouth as needed.   . Rivaroxaban (XARELTO) 15 MG TABS tablet Take 15 mg by mouth daily with supper.     Review of Systems  Constitutional: Negative.   HENT: Negative.   Eyes: Negative.   Respiratory: Positive for shortness of breath.   Cardiovascular: Negative.   Gastrointestinal: Negative.   Endocrine: Negative.   Musculoskeletal: Negative.   Skin: Negative.   Allergic/Immunologic: Negative.   Neurological: Negative.        Feels jittery inside  Hematological: Negative.   Psychiatric/Behavioral: Negative.   All other systems reviewed and are negative.   BP 122/74  Pulse 87  Ht 5\' 8"  (1.727 m)  Wt 149 lb 4 oz (67.699 kg)  BMI 22.70 kg/m2  Physical Exam  Nursing note and vitals reviewed. Constitutional: She is oriented to person, place, and time. She appears well-developed and well-nourished.  HENT:  Head: Normocephalic.  Nose: Nose normal.  Mouth/Throat: Oropharynx is clear and moist.  Eyes: Conjunctivae are normal. Pupils are equal, round, and reactive to light.  Neck: Normal range of motion. Neck supple. No JVD present.  Cardiovascular: Normal rate, S1  normal, S2 normal, normal heart sounds and intact distal pulses.  An irregularly irregular rhythm present. Exam reveals no gallop and no friction rub.   No murmur heard. Pulmonary/Chest: Effort normal and breath sounds normal. No respiratory distress. She has no wheezes. She has no rales. She exhibits no tenderness.   Abdominal: Soft. Bowel sounds are normal. She exhibits no distension. There is no tenderness.  Musculoskeletal: Normal range of motion. She exhibits no edema and no tenderness.  Lymphadenopathy:    She has no cervical adenopathy.  Neurological: She is alert and oriented to person, place, and time. Coordination normal.  Skin: Skin is warm and dry. No rash noted. No erythema.  Psychiatric: She has a normal mood and affect. Her behavior is normal. Judgment and thought content normal.    Assessment and Plan

## 2013-11-28 NOTE — Assessment & Plan Note (Signed)
We had a long discussion about her shortness of breath symptoms. Suspect this could come secondary to poor heart rate control. Recommended she restart her metoprolol twice a day

## 2013-11-28 NOTE — Assessment & Plan Note (Signed)
She is tolerating anticoagulation with no symptoms. No recent falls

## 2013-11-28 NOTE — Assessment & Plan Note (Signed)
She's not tolerating amiodarone. She did not hold normal sinus rhythm. She would like to stop the amiodarone and continue on metoprolol. We have suggested she take metoprolol one half up to one full pill twice a day, likely 25 mg. She will continue on digoxin every other day, takes Lasix as needed for shortness of breath or leg edema

## 2013-11-28 NOTE — Patient Instructions (Addendum)
Ok to hold the amiodarone Monitor your heart rate  Take metoprolol twice a day as needed for heart rate control  Please call us if you have new issues that need to be addressed before your next appt.  Your physician wants you to follow-up in: 6 months.  You will receive a reminder letter in the mail two months in advance. If you don't receive a letter, please call our office to schedule the follow-up appointment.

## 2013-11-28 NOTE — Assessment & Plan Note (Signed)
Recommended she take Lasix as needed for leg edema or shortness of breath.

## 2013-12-09 ENCOUNTER — Encounter: Payer: Self-pay | Admitting: Internal Medicine

## 2013-12-09 ENCOUNTER — Ambulatory Visit (INDEPENDENT_AMBULATORY_CARE_PROVIDER_SITE_OTHER): Payer: Medicare Other | Admitting: Internal Medicine

## 2013-12-09 VITALS — BP 100/60 | HR 64 | Ht 68.0 in | Wt 148.8 lb

## 2013-12-09 DIAGNOSIS — R000 Tachycardia, unspecified: Secondary | ICD-10-CM

## 2013-12-09 DIAGNOSIS — I481 Persistent atrial fibrillation: Secondary | ICD-10-CM

## 2013-12-09 DIAGNOSIS — I509 Heart failure, unspecified: Secondary | ICD-10-CM

## 2013-12-09 DIAGNOSIS — Z79899 Other long term (current) drug therapy: Secondary | ICD-10-CM

## 2013-12-09 DIAGNOSIS — T462X5A Adverse effect of other antidysrhythmic drugs, initial encounter: Secondary | ICD-10-CM

## 2013-12-09 DIAGNOSIS — I340 Nonrheumatic mitral (valve) insufficiency: Secondary | ICD-10-CM

## 2013-12-09 DIAGNOSIS — I4819 Other persistent atrial fibrillation: Secondary | ICD-10-CM

## 2013-12-09 DIAGNOSIS — G620 Drug-induced polyneuropathy: Secondary | ICD-10-CM

## 2013-12-09 DIAGNOSIS — I5030 Unspecified diastolic (congestive) heart failure: Secondary | ICD-10-CM

## 2013-12-09 NOTE — Progress Notes (Signed)
Patient Care Team: Bari Edward, MD as PCP - General (Internal Medicine)   HPI  Sierra Kaiser is a 80 y.o. female Seen in followup for atrial fibrillation. She underwent pulmonary vein ablation at [facility] 10/14. She's been treated with dofetilide. She underwent cardioversion 3/15  Rate control medications were challenge because of hypotension. She had in fact syncope attributed to low blood pressure.    At the last visit she was started on amio having previously been started on dig.  Level was 1.3  >BUNCr also >>28/1.7 GFR 28 <<40  She had problems with jitters and overall sense of malaise since starting the amiodarone;  This somewhat better with the decreased dose 400 which was to decrease further>>200   Stopped after below noted cardioversion and feels much better  She underwent DCCV in early September; did not hold sinus  st  TSH just above ULN    An echocardiogram 8/15 demonstrated normal left ventricular functionand mild LAE  (43/2.4)  Past Medical History  Diagnosis Date  . A-fib   . CHF (congestive heart failure)   . Hypotension   . Hyperlipidemia   . Raynaud's disease   . Meniere's disease   . Diverticulitis large intestine   . GI bleeding   . Anxiety   . Urinary incontinence, urge   . Bradycardia   . Colitis   . CKD (chronic kidney disease)   . SNHL (sensorineural hearing loss)   . Tinnitus   . 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 Cardiac electrophysiology study and ablation    . Transesophageal echocardiogram with cardioversion      [facility]  . Gallbladder surgery    . Vaginal hysterectomy    . Hemorrhoid surgery    . Upper gi endoscopy    . Appendectomy    . Cardiac catheterization  2014    [facility]    Current Outpatient Prescriptions  Medication Sig Dispense Refill  . BIOTIN PO Take by mouth daily.      . Cyanocobalamin (VITAMIN B 12 PO) Take 1,000 mcg by mouth daily.      . digoxin  (LANOXIN) 0.125 MG tablet Take by mouth every other day.       . furosemide (LASIX) 20 MG tablet Take 1 tablet (20 mg total) by mouth daily as needed.  30 tablet  6  . metoprolol tartrate (LOPRESSOR) 25 MG tablet Take 1 tablet (25 mg total) by mouth 2 (two) times daily.  180 tablet  3  . omeprazole (PRILOSEC) 20 MG capsule Take 20 mg by mouth as needed.       . Rivaroxaban (XARELTO) 15 MG TABS tablet Take 15 mg by mouth daily with supper.        No current facility-administered medications for this visit.    Allergies  Allergen Reactions  . Ciprofloxacin   . Contrast Media [Iodinated Diagnostic Agents]     Hives    . Diltiazem Hcl   . Metronidazole     Review of Systems negative except from HPI and PMH  Physical Exam BP 100/60  Pulse 64  Ht 5\' 8"  (1.727 m)  Wt 148 lb 12 oz (67.473 kg)  BMI 22.62 kg/m2 Well developed and well nourished in no acute distress HENT normal E scleral and icterus clear Neck Supple JVP flat; carotids brisk and full Clear to ausculation Irregular rate and rhythm, no murmurs gallops or rub Soft with active bowel  sounds No clubbing cyanosis  Edema Alert and oriented, grossly normal motor and sensory function Skin Warm and Dry  ECG demonstrates atrial fibrillation at 78   Assessment and  Plan  Atrial fibrillation-persistent  Hypotension  Amiodarone neurotoxicity  improved  MR  Iodinated contrast reaction  She did not sustain sinus  She is not interested in another ablation  Her HR today climbing stiars was only 75 so will not pursue AV ablation/PPM  Her TTE at [facility] suggested mild MR but TEE >> moderate MR  Will repeat echo  Check dig level and bmet

## 2013-12-09 NOTE — Patient Instructions (Addendum)
Your physician has requested that you have an echocardiogram. Echocardiography is a painless test that uses sound waves to create images of your heart. It provides your doctor with information about the size and shape of your heart and how well your heart's chambers and valves are working. This procedure takes approximately one hour. There are no restrictions for this procedure.  Your physician recommends that you have labs today:  Digoxin level   Your physician recommends that you schedule a follow-up appointment in:  3 months

## 2013-12-10 ENCOUNTER — Telehealth: Payer: Self-pay | Admitting: *Deleted

## 2013-12-10 DIAGNOSIS — Z79899 Other long term (current) drug therapy: Secondary | ICD-10-CM

## 2013-12-10 LAB — DIGOXIN LEVEL: Digoxin Level: 1.8 ng/mL (ref 0.9–2.0)

## 2013-12-10 MED ORDER — DIGOXIN 125 MCG PO TABS: 0.0625 mg | ORAL_TABLET | ORAL | Status: DC

## 2013-12-10 NOTE — Telephone Encounter (Signed)
Message copied by Tracie Harrier on Wed Dec 10, 2013  1:30 PM ------      Message from: [name]      Created: Wed Dec 10, 2013  8:43 AM       Dig level too high  Please clarify how she is taking it  And then cut in half and rechekc in two weeks plz ------

## 2013-12-23 ENCOUNTER — Other Ambulatory Visit: Payer: Medicare Other

## 2013-12-30 ENCOUNTER — Encounter: Payer: Medicare Other | Admitting: Internal Medicine

## 2014-01-06 ENCOUNTER — Encounter: Payer: Medicare Other | Admitting: Physician Assistant

## 2014-01-06 ENCOUNTER — Other Ambulatory Visit: Payer: Self-pay

## 2014-01-06 ENCOUNTER — Other Ambulatory Visit (INDEPENDENT_AMBULATORY_CARE_PROVIDER_SITE_OTHER): Payer: Medicare Other

## 2014-01-06 ENCOUNTER — Other Ambulatory Visit (HOSPITAL_COMMUNITY): Payer: Self-pay | Admitting: *Deleted

## 2014-01-06 ENCOUNTER — Ambulatory Visit (INDEPENDENT_AMBULATORY_CARE_PROVIDER_SITE_OTHER): Payer: Medicare Other | Admitting: Physician Assistant

## 2014-01-06 ENCOUNTER — Other Ambulatory Visit: Payer: Medicare Other

## 2014-01-06 DIAGNOSIS — M79604 Pain in right leg: Secondary | ICD-10-CM

## 2014-01-06 DIAGNOSIS — I481 Persistent atrial fibrillation: Secondary | ICD-10-CM

## 2014-01-06 DIAGNOSIS — I482 Chronic atrial fibrillation, unspecified: Secondary | ICD-10-CM

## 2014-01-06 DIAGNOSIS — Z79899 Other long term (current) drug therapy: Secondary | ICD-10-CM

## 2014-01-06 DIAGNOSIS — I340 Nonrheumatic mitral (valve) insufficiency: Secondary | ICD-10-CM

## 2014-01-06 MED ORDER — EDOXABAN TOSYLATE 30 MG PO TABS: 30.0000 mg | ORAL_TABLET | Freq: Every day | ORAL | Status: DC

## 2014-01-06 NOTE — Progress Notes (Signed)
See prior encounter.   This encounter was created in error - please disregard.

## 2014-01-06 NOTE — Patient Instructions (Signed)
Please stop Xarelto Please start Savaysa 30 mg once daily  Please return for BMET in 1 week  Please follow up as directed with Dr. Rockey Situ and Dr. [name]

## 2014-01-06 NOTE — Progress Notes (Signed)
Patient Name: Sierra Kaiser, Sierra Kaiser [birth year], MRN [id number]  Date of Encounter: 01/06/2014  Primary Care Provider:  Bari Edward, MD Primary Cardiologist:  Dr. Rockey Situ, MD Primary Electrophysiologist: Dr. [name], MD  Patient Profile:  80 y.o. female with history below who presented to the office today for an echo 2/2 her TTE at [facility] suggested mild MR bu TEE >> moderate MR. While in the echo room she mentioned the posterior aspect of her right knee has been swollen. No known injury or trauma. She is on Xarelto 15 mg daily for a-fib (CrCl 30.5). We have added on a lower extremity venous doppler to evaluate for DVT.    Problem List:   Past Medical History  Diagnosis Date  . A-fib   . CHF (congestive heart failure)   . Hypotension   . Hyperlipidemia   . Raynaud's disease   . Meniere's disease   . Diverticulitis large intestine   . GI bleeding   . Anxiety   . Urinary incontinence, urge   . Bradycardia   . Colitis   . CKD (chronic kidney disease)   . SNHL (sensorineural hearing loss)   . Tinnitus   . 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 Cardiac electrophysiology study and ablation    . Transesophageal echocardiogram with cardioversion      [facility]  . Gallbladder surgery    . Vaginal hysterectomy    . Hemorrhoid surgery    . Upper gi endoscopy    . Appendectomy    . Cardiac catheterization  2014    [facility]     Allergies:  Allergies  Allergen Reactions  . Ciprofloxacin   . Contrast Media [Iodinated Diagnostic Agents]     Hives    . Diltiazem Hcl   . Metronidazole      HPI:  80 y.o. female with the above problem list who presented to clinic this afternoon with the above. She requests further evaluation.   Upon reviewing her chart I saw that her CrCl was 30.5 on 11/04/2013 and 28.4 on 11/03/2013. She was previously on warfarin but was changed on Xarelto 2-3 years ago 2/2 INR checks and has remained on this  since. She has not missed any doses.    Home Medications:  Prior to Admission medications   Medication Sig Start Date End Date Taking? Authorizing Provider  BIOTIN PO Take by mouth daily.    Historical Provider, MD  Cyanocobalamin (VITAMIN B 12 PO) Take 1,000 mcg by mouth daily.    Historical Provider, MD  digoxin (LANOXIN) 0.125 MG tablet Take 0.5 tablets (0.0625 mg total) by mouth every other day. 12/10/13   [name], MD  furosemide (LASIX) 20 MG tablet Take 1 tablet (20 mg total) by mouth daily as needed. 07/25/13   Minna Merritts, MD  metoprolol tartrate (LOPRESSOR) 25 MG tablet Take 1 tablet (25 mg total) by mouth 2 (two) times daily. 11/28/13   Minna Merritts, MD  omeprazole (PRILOSEC) 20 MG capsule Take 20 mg by mouth as needed.     Historical Provider, MD  Rivaroxaban (XARELTO) 15 MG TABS tablet Take 15 mg by mouth daily with supper.     Historical Provider, MD     Weights: There were no vitals filed for this visit.   Review of Systems:  All other systems reviewed and are otherwise negative except as noted above.  Physical Exam:  There were no vitals taken for this  visit.  General: Pleasant, NAD Psych: Normal affect. Neuro: Alert and oriented X 3. Moves all extremities spontaneously. HEENT: Normal.  Neck: Supple. Lungs:  Resp regular and unlabored. Heart: irregular rate and rhythm. Extremities: Right lower extremity without swelling, erythema, TTP, or warmth. No cording.    Accessory Clinical Findings:   Assessment & Plan:  1. Right knee pain: -Negative right lower extremity doppler -Heat -Rest -Follow up with PCP  2. A-fib: -Previously on warfarin, was changed to Xarelto 2-3 years ago. Currently on Xarelto 15 mg 2/2 renal function. CrCl currently 30.5 as of BMP drawn on 11/04/2013. Xarelto is contraindicated in CrCl less than 30.  -Change to Savaysa 30 mg daily  -Follow up BMP 1 week   Christell Faith, PA-C [facility] [place],  [phone] [place] 01/06/2014, 2:09 PM

## 2014-01-07 ENCOUNTER — Other Ambulatory Visit: Payer: Self-pay

## 2014-01-07 MED ORDER — RIVAROXABAN 15 MG PO TABS: 15.0000 mg | ORAL_TABLET | Freq: Every day | ORAL | Status: DC

## 2014-01-15 ENCOUNTER — Other Ambulatory Visit: Payer: Self-pay

## 2014-01-16 ENCOUNTER — Telehealth: Payer: Self-pay | Admitting: *Deleted

## 2014-01-16 NOTE — Telephone Encounter (Signed)
A representative from [employer] called and would like a call back to discuss why this patient was prescribed savaysa and also to discuss this patients kidney function. They can be reached at [phone] and provide reference number [id number]. Thanks, MI

## 2014-01-16 NOTE — Telephone Encounter (Signed)
SPOKE WITH  PHARMACISTS  QUESTIONS ALL ANSWERED .Sierra Kaiser

## 2014-02-02 ENCOUNTER — Telehealth: Payer: Self-pay

## 2014-02-02 NOTE — Telephone Encounter (Signed)
Notified pharmacy [facility] is approved from 12/30/2013 to 01/29/2015.

## 2014-03-26 LAB — CBC AND DIFFERENTIAL
Hemoglobin: 12.9 g/dL (ref 12.0–16.0)
Platelets: 234 10*3/uL (ref 150–399)
WBC: 5800 10^3/mL

## 2014-03-26 LAB — LIPID PANEL
Cholesterol: 293 mg/dL — AB (ref 0–200)
HDL: 45 mg/dL (ref 35–70)
LDL Cholesterol: 206 mg/dL
Triglycerides: 210 mg/dL — AB (ref 40–160)

## 2014-03-26 LAB — HEPATIC FUNCTION PANEL
ALT: 11 U/L (ref 7–35)
AST: 14 U/L (ref 13–35)
Alkaline Phosphatase: 57 U/L (ref 25–125)
Bilirubin, Total: 0.3 mg/dL

## 2014-03-26 LAB — BASIC METABOLIC PANEL
Creatinine: 1.3 mg/dL — AB (ref <=1.1)
Glucose: 92 mg/dL
Potassium: 4.9 mmol/L (ref 3.4–5.3)

## 2014-04-07 ENCOUNTER — Encounter: Payer: Self-pay | Admitting: Internal Medicine

## 2014-04-07 ENCOUNTER — Ambulatory Visit (INDEPENDENT_AMBULATORY_CARE_PROVIDER_SITE_OTHER): Payer: Medicare Other | Admitting: Internal Medicine

## 2014-04-07 VITALS — BP 104/59 | HR 92 | Ht 68.0 in | Wt 153.2 lb

## 2014-04-07 DIAGNOSIS — I4891 Unspecified atrial fibrillation: Secondary | ICD-10-CM

## 2014-04-07 NOTE — Patient Instructions (Addendum)
Your physician has requested that you have an exercise tolerance test. For further information please visit [email or web address]. Please also follow instruction sheet, as given. -Wear comfortable cloths and walking shoes -eat a light breakfast  -Take all of your medications    Your physician has recommended that you wear a holter monitor. Holter monitors are medical devices that record the heart's electrical activity. Doctors most often use these monitors to diagnose arrhythmias. Arrhythmias are problems with the speed or rhythm of the heartbeat. The monitor is a small, portable device. You can wear one while you do your normal daily activities. This is usually used to diagnose what is causing palpitations/syncope (passing out).

## 2014-04-07 NOTE — Progress Notes (Signed)
Patient Care Team: Bari Edward, MD as PCP - General (Internal Medicine)   HPI  Sierra Kaiser is a 81 y.o. female Seen in followup for now permanent atrial fibrillation. She underwent pulmonary vein ablation at [facility] 10/14. She's been treated with dofetilide. She underwent cardioversion 3/15  Rate control medications were challenge because of hypotension. She had in fact syncope attributed to low blood pressure.    At the last visit she was started on amio having previously been started on dig.  Level was 1.3  >BUNCr also >>28/1.7 GFR 28 <<40  repeat blood work not in our system was reviewed. Renal function was much improved and 1.3 range and potassium was less than 5. \ Major complaint remains dyspnea on exertion. This is variable.   An echocardiogram 11/15 demonstrated normal left ventricular functionand mild LAE  (43/2.4) with mild mod MR  Past Medical History  Diagnosis Date  . A-fib   . CHF (congestive heart failure)   . Hypotension   . Hyperlipidemia   . Raynaud's disease   . Meniere's disease   . Diverticulitis large intestine   . GI bleeding   . Anxiety   . Urinary incontinence, urge   . Bradycardia   . Colitis   . CKD (chronic kidney disease)   . SNHL (sensorineural hearing loss)   . Tinnitus   . 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 Kidney infection     Past Surgical History  Procedure Laterality Date  . Cardiac electrophysiology study and ablation    . Transesophageal echocardiogram with cardioversion      [facility]  . Gallbladder surgery    . Vaginal hysterectomy    . Hemorrhoid surgery    . Upper gi endoscopy    . Appendectomy    . Cardiac catheterization  2014    [facility]    Current Outpatient Prescriptions  Medication Sig Dispense Refill  . BIOTIN PO Take by mouth daily.    . ciprofloxacin (CIPRO) 500 MG tablet Take 500 mg by mouth 2 (two) times daily.     . Cyanocobalamin (VITAMIN B 12 PO) Take 1,000 mcg by mouth daily.    . digoxin  (LANOXIN) 0.125 MG tablet Take 0.5 tablets (0.0625 mg total) by mouth every other day.    . furosemide (LASIX) 20 MG tablet Take 1 tablet (20 mg total) by mouth daily as needed. 30 tablet 6  . metoprolol tartrate (LOPRESSOR) 25 MG tablet Take 1 tablet (25 mg total) by mouth 2 (two) times daily. 180 tablet 3  . omeprazole (PRILOSEC) 20 MG capsule Take 20 mg by mouth as needed.     . Rivaroxaban (XARELTO) 15 MG TABS tablet Take 1 tablet (15 mg total) by mouth daily with supper. 90 tablet 3   No current facility-administered medications for this visit.    Allergies  Allergen Reactions  . Ciprofloxacin   . Contrast Media [Iodinated Diagnostic Agents]     Hives    . Diltiazem Hcl   . Metronidazole     Review of Systems negative except from HPI and PMH  Physical Exam BP 104/59 mmHg  Pulse 92  Ht 5\' 8"  (1.727 m)  Wt 153 lb 4 oz (69.514 kg)  BMI 23.31 kg/m2 Well developed and well nourished in no acute distress HENT normal E scleral and icterus clear Neck Supple JVP flat; carotids brisk and full Clear to ausculation Irregular rate and rhythm, no murmurs gallops or rub Soft with active bowel sounds  No clubbing cyanosis  Edema Alert and oriented, grossly normal motor and sensory function Skin Warm and Dry  ECG demonstrates atrial fibrillation at 78   Assessment and  Plan  Atrial fibrillation-persistent  Hypotension  DOE  Amiodarone neurotoxicity  improved  MR-- mild mod  Iodinated contrast reaction    Dyspnea may be related to Afib with Rapid rate suggested by the ECG  SHe has had persistent atrial fibrillation. She failed dofetilide and has amiodarone neurotoxicity so the alternative strategies would either be repeat ablation or rate control.  To this and we have discussed the role of Holter monitoring to assess bradycardic excursion; we will then undertake treadmill testing to look at tachycardia. Options may be that rate control with or without a pacing and with  or without AV junction ablatio Another potential issue with the exercise associated mitral regurgitation and if the aforementioned evaluation is un illuminating, we will undertake stress echo  We spent more than 50% of our >40  min visit in face to face counseling regarding the above

## 2014-04-14 ENCOUNTER — Encounter: Payer: Medicare Other | Admitting: Internal Medicine

## 2014-04-14 DIAGNOSIS — I4891 Unspecified atrial fibrillation: Secondary | ICD-10-CM

## 2014-04-15 ENCOUNTER — Encounter: Payer: Self-pay | Admitting: *Deleted

## 2014-04-15 ENCOUNTER — Telehealth: Payer: Self-pay | Admitting: *Deleted

## 2014-04-15 DIAGNOSIS — Z01812 Encounter for preprocedural laboratory examination: Secondary | ICD-10-CM

## 2014-04-15 NOTE — Telephone Encounter (Signed)
Informed patient per Dr. [name] after reviewing treadmill stress test:   Your physician has recommended that you have a pacemaker inserted. A pacemaker is a small device that is placed under the skin of your chest or abdomen to help control abnormal heart rhythms. This device uses electrical pulses to prompt the heart to beat at a normal rate. Pacemakers are used to treat heart rhythms that are too slow. Wire (leads) are attached to the pacemaker that goes into the chambers of you heart. This is done in the hospital and usually requires and overnight stay. Please see the instruction sheet given to you today for more information.  Scheduled for 04/22/14 at 1:30 pm  Patient to arrive at 11:30am   Pre procedure labs scheduled for 2/11  Instruction letter left at front desk and reviewed with patient  Patient verbalized understanding

## 2014-04-16 ENCOUNTER — Other Ambulatory Visit (INDEPENDENT_AMBULATORY_CARE_PROVIDER_SITE_OTHER): Payer: Medicare Other

## 2014-04-16 DIAGNOSIS — Z01812 Encounter for preprocedural laboratory examination: Secondary | ICD-10-CM

## 2014-04-17 ENCOUNTER — Telehealth: Payer: Self-pay | Admitting: *Deleted

## 2014-04-17 LAB — BASIC METABOLIC PANEL
BUN/Creatinine Ratio: 26 (ref 11–26)
BUN: 33 mg/dL — ABNORMAL HIGH (ref 8–27)
CO2: 20 mmol/L (ref 18–29)
Calcium: 9.5 mg/dL (ref 8.7–10.3)
Chloride: 105 mmol/L (ref 97–108)
Creatinine, Ser: 1.25 mg/dL — ABNORMAL HIGH (ref 0.57–1.00)
GFR calc Af Amer: 48 mL/min/{1.73_m2} — ABNORMAL LOW (ref >59)
GFR calc non Af Amer: 41 mL/min/{1.73_m2} — ABNORMAL LOW (ref >59)
Glucose: 94 mg/dL (ref 65–99)
Potassium: 5.3 mmol/L — ABNORMAL HIGH (ref 3.5–5.2)
Sodium: 141 mmol/L (ref 134–144)

## 2014-04-17 LAB — CBC WITH DIFFERENTIAL/PLATELET
Basophils Absolute: 0.1 10*3/uL (ref 0.0–0.2)
Basos: 1 %
Eos: 3 %
Eosinophils Absolute: 0.2 10*3/uL (ref 0.0–0.4)
HCT: 37 % (ref 34.0–46.6)
Hemoglobin: 12.1 g/dL (ref 11.1–15.9)
Immature Grans (Abs): 0 10*3/uL (ref 0.0–0.1)
Immature Granulocytes: 0 %
Lymphocytes Absolute: 1.9 10*3/uL (ref 0.7–3.1)
Lymphs: 32 %
MCH: 27.3 pg (ref 26.6–33.0)
MCHC: 32.7 g/dL (ref 31.5–35.7)
MCV: 83 fL (ref 79–97)
Monocytes Absolute: 0.4 10*3/uL (ref 0.1–0.9)
Monocytes: 6 %
Neutrophils Absolute: 3.5 10*3/uL (ref 1.4–7.0)
Neutrophils Relative %: 58 %
Platelets: 239 10*3/uL (ref 150–379)
RBC: 4.44 x10E6/uL (ref 3.77–5.28)
RDW: 15.7 % — ABNORMAL HIGH (ref 12.3–15.4)
WBC: 6.1 10*3/uL (ref 3.4–10.8)

## 2014-04-17 LAB — PROTIME-INR
INR: 1.3 — ABNORMAL HIGH (ref 0.8–1.2)
Prothrombin Time: 13.1 s — ABNORMAL HIGH (ref 9.1–12.0)

## 2014-04-17 NOTE — Telephone Encounter (Signed)
Patient is having a pacemaker put in on Wed. 04/22/14 and needs a medications (not sure what the medication) [facility] [address] [address]. Please call the patient as she is confused about the medication need prior to surgery.

## 2014-04-17 NOTE — Telephone Encounter (Signed)
I returned call to patient  Patients husband stated that they received a voicemail about patients medications but it was not from [place]  They called [place] on accident

## 2014-04-22 ENCOUNTER — Ambulatory Visit (HOSPITAL_COMMUNITY)
Admission: RE | Admit: 2014-04-22 | Discharge: 2014-04-23 | Disposition: A | Discharge status: Home / Self Care | Payer: Medicare Other | Source: Ambulatory Visit | Attending: Internal Medicine | Admitting: Internal Medicine

## 2014-04-22 ENCOUNTER — Encounter (HOSPITAL_COMMUNITY): Payer: Self-pay | Admitting: *Deleted

## 2014-04-22 ENCOUNTER — Encounter (HOSPITAL_COMMUNITY)
Admission: RE | Disposition: A | Discharge status: Home / Self Care | Payer: Self-pay | Source: Ambulatory Visit | Attending: Internal Medicine

## 2014-04-22 DIAGNOSIS — I340 Nonrheumatic mitral (valve) insufficiency: Secondary | ICD-10-CM | POA: Insufficient documentation

## 2014-04-22 DIAGNOSIS — Z79899 Other long term (current) drug therapy: Secondary | ICD-10-CM | POA: Insufficient documentation

## 2014-04-22 DIAGNOSIS — T462X5A Adverse effect of other antidysrhythmic drugs, initial encounter: Secondary | ICD-10-CM | POA: Diagnosis not present

## 2014-04-22 DIAGNOSIS — I509 Heart failure, unspecified: Secondary | ICD-10-CM | POA: Diagnosis not present

## 2014-04-22 DIAGNOSIS — Z7901 Long term (current) use of anticoagulants: Secondary | ICD-10-CM | POA: Diagnosis not present

## 2014-04-22 DIAGNOSIS — I442 Atrioventricular block, complete: Secondary | ICD-10-CM | POA: Insufficient documentation

## 2014-04-22 DIAGNOSIS — E785 Hyperlipidemia, unspecified: Secondary | ICD-10-CM | POA: Diagnosis not present

## 2014-04-22 DIAGNOSIS — N189 Chronic kidney disease, unspecified: Secondary | ICD-10-CM | POA: Insufficient documentation

## 2014-04-22 DIAGNOSIS — I4891 Unspecified atrial fibrillation: Secondary | ICD-10-CM | POA: Diagnosis present

## 2014-04-22 DIAGNOSIS — I4821 Permanent atrial fibrillation: Secondary | ICD-10-CM | POA: Diagnosis present

## 2014-04-22 DIAGNOSIS — I959 Hypotension, unspecified: Secondary | ICD-10-CM | POA: Diagnosis not present

## 2014-04-22 DIAGNOSIS — Z959 Presence of cardiac and vascular implant and graft, unspecified: Secondary | ICD-10-CM

## 2014-04-22 HISTORY — PX: PERMANENT PACEMAKER INSERTION: SHX5480

## 2014-04-22 LAB — BASIC METABOLIC PANEL
Anion gap: 6 (ref 5–15)
BUN: 26 mg/dL — ABNORMAL HIGH (ref 6–23)
CO2: 24 mmol/L (ref 19–32)
Calcium: 9.3 mg/dL (ref 8.4–10.5)
Chloride: 111 mmol/L (ref 96–112)
Creatinine, Ser: 1.37 mg/dL — ABNORMAL HIGH (ref 0.50–1.10)
GFR calc Af Amer: 42 mL/min — ABNORMAL LOW (ref >90)
GFR calc non Af Amer: 36 mL/min — ABNORMAL LOW (ref >90)
Glucose, Bld: 98 mg/dL (ref 70–99)
Potassium: 4.1 mmol/L (ref 3.5–5.1)
Sodium: 141 mmol/L (ref 135–145)

## 2014-04-22 LAB — SURGICAL PCR SCREEN
MRSA, PCR: NEGATIVE
Staphylococcus aureus: NEGATIVE

## 2014-04-22 SURGERY — PERMANENT PACEMAKER INSERTION
Anesthesia: LOCAL

## 2014-04-22 MED ORDER — ACETAMINOPHEN 325 MG PO TABS: 325.0000 mg | ORAL_TABLET | ORAL | Status: DC | PRN

## 2014-04-22 MED ORDER — HEPARIN (PORCINE) IN NACL 2-0.9 UNIT/ML-% IJ SOLN
INTRAMUSCULAR | Status: AC
  Filled 2014-04-22: qty 500

## 2014-04-22 MED ORDER — MUPIROCIN 2 % EX OINT
1.0000 "application " | TOPICAL_OINTMENT | Freq: Once | CUTANEOUS | Status: AC
  Administered 2014-04-22: 12:00:00 via TOPICAL

## 2014-04-22 MED ORDER — MUPIROCIN 2 % EX OINT
TOPICAL_OINTMENT | CUTANEOUS | Status: AC
  Filled 2014-04-22: qty 22

## 2014-04-22 MED ORDER — FENTANYL CITRATE 0.05 MG/ML IJ SOLN
INTRAMUSCULAR | Status: AC
  Filled 2014-04-22: qty 2

## 2014-04-22 MED ORDER — PANTOPRAZOLE SODIUM 40 MG PO TBEC
40.0000 mg | DELAYED_RELEASE_TABLET | Freq: Every day | ORAL | Status: DC
  Administered 2014-04-22: 40 mg via ORAL
  Filled 2014-04-22: qty 1

## 2014-04-22 MED ORDER — LIDOCAINE HCL (PF) 1 % IJ SOLN
INTRAMUSCULAR | Status: AC
  Filled 2014-04-22: qty 60

## 2014-04-22 MED ORDER — GENTAMICIN SULFATE 40 MG/ML IJ SOLN
80.0000 mg | INTRAMUSCULAR | Status: DC
  Filled 2014-04-22: qty 2

## 2014-04-22 MED ORDER — CEFAZOLIN SODIUM 1-5 GM-% IV SOLN
1.0000 g | Freq: Two times a day (BID) | INTRAVENOUS | Status: AC
  Administered 2014-04-22 – 2014-04-23 (×2): 1 g via INTRAVENOUS
  Filled 2014-04-22 (×2): qty 50

## 2014-04-22 MED ORDER — ONDANSETRON HCL 4 MG/2ML IJ SOLN: 4.0000 mg | Freq: Four times a day (QID) | INTRAMUSCULAR | Status: DC | PRN

## 2014-04-22 MED ORDER — METOPROLOL TARTRATE 12.5 MG HALF TABLET
12.5000 mg | ORAL_TABLET | Freq: Two times a day (BID) | ORAL | Status: DC
  Administered 2014-04-22 – 2014-04-23 (×2): 12.5 mg via ORAL
  Filled 2014-04-22 (×2): qty 1

## 2014-04-22 MED ORDER — IBUPROFEN 200 MG PO TABS
400.0000 mg | ORAL_TABLET | Freq: Every evening | ORAL | Status: DC | PRN
Start: 2014-04-22 — End: 2014-04-23
  Administered 2014-04-22: 400 mg via ORAL
  Filled 2014-04-22: qty 2

## 2014-04-22 MED ORDER — CEFAZOLIN SODIUM 1-5 GM-% IV SOLN
1.0000 g | Freq: Four times a day (QID) | INTRAVENOUS | Status: DC
  Filled 2014-04-22: qty 50

## 2014-04-22 MED ORDER — CEFAZOLIN SODIUM-DEXTROSE 2-3 GM-% IV SOLR: 2.0000 g | INTRAVENOUS | Status: DC

## 2014-04-22 MED ORDER — SODIUM CHLORIDE 0.9 % IV SOLN
INTRAVENOUS | Status: AC
  Administered 2014-04-22: 17:00:00 via INTRAVENOUS

## 2014-04-22 MED ORDER — DIGOXIN 125 MCG PO TABS
0.0625 mg | ORAL_TABLET | ORAL | Status: DC
  Administered 2014-04-23: 0.0625 mg via ORAL
  Filled 2014-04-22: qty 1

## 2014-04-22 MED ORDER — SODIUM CHLORIDE 0.9 % IV SOLN
INTRAVENOUS | Status: DC
  Administered 2014-04-22: 12:00:00 via INTRAVENOUS

## 2014-04-22 MED ORDER — CHLORHEXIDINE GLUCONATE 4 % EX LIQD
60.0000 mL | Freq: Once | CUTANEOUS | Status: DC
  Filled 2014-04-22: qty 60

## 2014-04-22 MED ORDER — RIVAROXABAN 15 MG PO TABS
15.0000 mg | ORAL_TABLET | Freq: Every day | ORAL | Status: DC
  Administered 2014-04-22: 15 mg via ORAL
  Filled 2014-04-22: qty 1

## 2014-04-22 MED ORDER — MIDAZOLAM HCL 5 MG/5ML IJ SOLN
INTRAMUSCULAR | Status: AC
  Filled 2014-04-22: qty 5

## 2014-04-22 MED ORDER — CEFAZOLIN SODIUM-DEXTROSE 2-3 GM-% IV SOLR
INTRAVENOUS | Status: AC
  Filled 2014-04-22: qty 50

## 2014-04-22 MED ORDER — VITAMIN B-12 1000 MCG PO TABS
1000.0000 ug | ORAL_TABLET | Freq: Every day | ORAL | Status: DC
  Administered 2014-04-23: 1000 ug via ORAL
  Filled 2014-04-22: qty 1

## 2014-04-22 NOTE — CV Procedure (Signed)
Sierra Kaiser [id number]  [id number]  Preop Dx: AV ablation anticipated with then irreversible complete heart block Postop Dx same/   Cx: none apparent    Procedure single pacemaker implantation  After routine prep and drape, lidocaine was infiltrated in the prepectoral subclavicular region on the left side an incision was made and carried down to later the prepectoral fascia using electrocautery and sharp dissection a pocket was formed similarly. Hemostasis was obtained.  After this, we turned our attention to gaining accessm to the extrathoracic,left subclavian vein. This was accomplished without difficulty and without the aspiration of air or puncture of the artery. A single  Venipuncture was accomplished; guidewire was placed and a 7 French sheath placed  through which was passed an Doctor, general practice ventricular lead serial [id number]  .  The ventricular lead was manipulated to the right ventricular apex with a bipolar R wave was 24,  the pacing impedance was 680, the threshold was 0.4 @ 0.5 msec     The lead was affixed to the prepectoral fascia and attached to a  St Jude  pulse generator  Serial number [id number] .  Hemostasis was obtained. The pocket was copiously irrigated with antibiotic containing saline solution. The leads and the pulse generator were placed in the pocket and affixed to the prepectoral fascia. The wound iwas then closed in 2 layers in normal fashion. The wound was washed dried and Dermabond dressing was applied   Needle count, sponge count  and instrument counts were correct at the end of the procedure     EBL  mininmal

## 2014-04-22 NOTE — Interval H&P Note (Signed)
History and Physical Interval Note:  04/22/2014 11:48 AM  Sierra Kaiser  has presented today for surgery, with the diagnosis of complete heart block  The various methods of treatment have been discussed with the patient and family. After consideration of risks, benefits and other options for treatment, the patient has consented to  Procedure(s): PERMANENT PACEMAKER INSERTION (N/A) as a surgical intervention .  The patient's history has been reviewed, patient examined, no change in status, stable for surgery.  I have reviewed the patient's chart and labs.  Questions were answered to the patient's satisfaction.     Virl Axe  Pt has uncontrolled atrial fibrillation with anticipated AV ablation which will render her with irreversible complete heart block Hence will proceed with pacing

## 2014-04-22 NOTE — H&P (View-Only) (Signed)
Patient Care Team: Bari Edward, MD as PCP - General (Internal Medicine)   HPI  Sierra Kaiser is a 81 y.o. female Seen in followup for now permanent atrial fibrillation. She underwent pulmonary vein ablation at [facility] 10/14. She's been treated with dofetilide. She underwent cardioversion 3/15  Rate control medications were challenge because of hypotension. She had in fact syncope attributed to low blood pressure.    At the last visit she was started on amio having previously been started on dig.  Level was 1.3  >BUNCr also >>28/1.7 GFR 28 <<40  repeat blood work not in our system was reviewed. Renal function was much improved and 1.3 range and potassium was less than 5. \ Major complaint remains dyspnea on exertion. This is variable.   An echocardiogram 11/15 demonstrated normal left ventricular functionand mild LAE  (43/2.4) with mild mod MR  Past Medical History  Diagnosis Date  . A-fib   . CHF (congestive heart failure)   . Hypotension   . Hyperlipidemia   . Raynaud's disease   . Meniere's disease   . Diverticulitis large intestine   . GI bleeding   . Anxiety   . Urinary incontinence, urge   . Bradycardia   . Colitis   . CKD (chronic kidney disease)   . SNHL (sensorineural hearing loss)   . Tinnitus   . 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 Kidney infection     Past Surgical History  Procedure Laterality Date  . Cardiac electrophysiology study and ablation    . Transesophageal echocardiogram with cardioversion      [facility]  . Gallbladder surgery    . Vaginal hysterectomy    . Hemorrhoid surgery    . Upper gi endoscopy    . Appendectomy    . Cardiac catheterization  2014    [facility]    Current Outpatient Prescriptions  Medication Sig Dispense Refill  . BIOTIN PO Take by mouth daily.    . ciprofloxacin (CIPRO) 500 MG tablet Take 500 mg by mouth 2 (two) times daily.     . Cyanocobalamin (VITAMIN B 12 PO) Take 1,000 mcg by mouth daily.    . digoxin  (LANOXIN) 0.125 MG tablet Take 0.5 tablets (0.0625 mg total) by mouth every other day.    . furosemide (LASIX) 20 MG tablet Take 1 tablet (20 mg total) by mouth daily as needed. 30 tablet 6  . metoprolol tartrate (LOPRESSOR) 25 MG tablet Take 1 tablet (25 mg total) by mouth 2 (two) times daily. 180 tablet 3  . omeprazole (PRILOSEC) 20 MG capsule Take 20 mg by mouth as needed.     . Rivaroxaban (XARELTO) 15 MG TABS tablet Take 1 tablet (15 mg total) by mouth daily with supper. 90 tablet 3   No current facility-administered medications for this visit.    Allergies  Allergen Reactions  . Ciprofloxacin   . Contrast Media [Iodinated Diagnostic Agents]     Hives    . Diltiazem Hcl   . Metronidazole     Review of Systems negative except from HPI and PMH  Physical Exam BP 104/59 mmHg  Pulse 92  Ht 5\' 8"  (1.727 m)  Wt 153 lb 4 oz (69.514 kg)  BMI 23.31 kg/m2 Well developed and well nourished in no acute distress HENT normal E scleral and icterus clear Neck Supple JVP flat; carotids brisk and full Clear to ausculation Irregular rate and rhythm, no murmurs gallops or rub Soft with active bowel sounds  No clubbing cyanosis  Edema Alert and oriented, grossly normal motor and sensory function Skin Warm and Dry  ECG demonstrates atrial fibrillation at 78   Assessment and  Plan  Atrial fibrillation-persistent  Hypotension  DOE  Amiodarone neurotoxicity  improved  MR-- mild mod  Iodinated contrast reaction    Dyspnea may be related to Afib with Rapid rate suggested by the ECG  SHe has had persistent atrial fibrillation. She failed dofetilide and has amiodarone neurotoxicity so the alternative strategies would either be repeat ablation or rate control.  To this and we have discussed the role of Holter monitoring to assess bradycardic excursion; we will then undertake treadmill testing to look at tachycardia. Options may be that rate control with or without a pacing and with  or without AV junction ablatio Another potential issue with the exercise associated mitral regurgitation and if the aforementioned evaluation is un illuminating, we will undertake stress echo  We spent more than 50% of our >40  min visit in face to face counseling regarding the above

## 2014-04-22 NOTE — Progress Notes (Signed)
Pt Leaves ha in stable condition. Lt shoulder site is clean, and unremarkable. Pt family is updated.

## 2014-04-22 NOTE — Interval H&P Note (Signed)
History and Physical Interval Note:  04/22/2014 11:50 AM  Sierra Kaiser  has presented today for surgery, with the diagnosis of complete heart block  The various methods of treatment have been discussed with the patient and family. After consideration of risks, benefits and other options for treatment, the patient has consented to  Procedure(s): PERMANENT PACEMAKER INSERTION (N/A) as a surgical intervention .  The patient's history has been reviewed, patient examined, no change in status, stable for surgery.  I have reviewed the patient's chart and labs.  Questions were answered to the patient's satisfaction.     Virl Axe  Last labs notable for elevated K and Bun/C ratio  Will repeat

## 2014-04-23 ENCOUNTER — Encounter (HOSPITAL_COMMUNITY): Payer: Self-pay | Admitting: *Deleted

## 2014-04-23 ENCOUNTER — Ambulatory Visit (HOSPITAL_COMMUNITY): Payer: Medicare Other

## 2014-04-23 DIAGNOSIS — I442 Atrioventricular block, complete: Secondary | ICD-10-CM | POA: Diagnosis not present

## 2014-04-23 DIAGNOSIS — I4891 Unspecified atrial fibrillation: Secondary | ICD-10-CM

## 2014-04-23 DIAGNOSIS — Z792 Long term (current) use of antibiotics: Secondary | ICD-10-CM | POA: Diagnosis not present

## 2014-04-23 DIAGNOSIS — I482 Chronic atrial fibrillation: Secondary | ICD-10-CM | POA: Diagnosis not present

## 2014-04-23 DIAGNOSIS — I340 Nonrheumatic mitral (valve) insufficiency: Secondary | ICD-10-CM | POA: Diagnosis not present

## 2014-04-23 DIAGNOSIS — F419 Anxiety disorder, unspecified: Secondary | ICD-10-CM | POA: Diagnosis not present

## 2014-04-23 DIAGNOSIS — E785 Hyperlipidemia, unspecified: Secondary | ICD-10-CM | POA: Diagnosis not present

## 2014-04-23 DIAGNOSIS — I509 Heart failure, unspecified: Secondary | ICD-10-CM | POA: Diagnosis not present

## 2014-04-23 DIAGNOSIS — R0609 Other forms of dyspnea: Secondary | ICD-10-CM | POA: Diagnosis not present

## 2014-04-23 DIAGNOSIS — I959 Hypotension, unspecified: Secondary | ICD-10-CM | POA: Diagnosis not present

## 2014-04-23 DIAGNOSIS — N189 Chronic kidney disease, unspecified: Secondary | ICD-10-CM | POA: Diagnosis not present

## 2014-04-23 DIAGNOSIS — Z950 Presence of cardiac pacemaker: Secondary | ICD-10-CM | POA: Diagnosis not present

## 2014-04-23 DIAGNOSIS — Z7901 Long term (current) use of anticoagulants: Secondary | ICD-10-CM | POA: Diagnosis not present

## 2014-04-23 LAB — CBC
HCT: 32.2 % — ABNORMAL LOW (ref 36.0–46.0)
Hemoglobin: 10.4 g/dL — ABNORMAL LOW (ref 12.0–15.0)
MCH: 27.7 pg (ref 26.0–34.0)
MCHC: 32.3 g/dL (ref 30.0–36.0)
MCV: 85.9 fL (ref 78.0–100.0)
Platelets: 175 10*3/uL (ref 150–400)
RBC: 3.75 MIL/uL — ABNORMAL LOW (ref 3.87–5.11)
RDW: 14.5 % (ref 11.5–15.5)
WBC: 4.7 10*3/uL (ref 4.0–10.5)

## 2014-04-23 MED ORDER — OFF THE BEAT BOOK
Freq: Once | Status: AC
  Administered 2014-04-23: 10:00:00
  Filled 2014-04-23: qty 1

## 2014-04-23 MED ORDER — YOU HAVE A PACEMAKER BOOK
Freq: Once | Status: AC
  Administered 2014-04-23: 10:00:00
  Filled 2014-04-23: qty 1

## 2014-04-23 NOTE — Discharge Summary (Signed)
ELECTROPHYSIOLOGY PROCEDURE DISCHARGE SUMMARY    Patient ID: Sierra Kaiser,  MRN: [id number], DOB/AGE: [id number] 79 y.o.  Admit date: 04/22/2014 Discharge date: 04/23/2014  Primary Care Physician: Bari Edward, MD Primary Cardiologist: Rockey Situ Electrophysiologist: [name]  Primary Discharge Diagnosis:  Atrial fibrillation with RVR and anticipated AV node ablation status post pacemaker implantation this admission  Secondary Discharge Diagnosis:  1.  Moderate MR 2.  Amiodarone neurotoxicity 3.  Hypotension 4.  CKD  Allergies  Allergen Reactions  . Ciprofloxacin Nausea And Vomiting  . Contrast Media [Iodinated Diagnostic Agents] Hives       . Metronidazole Other (See Comments)    Pt does not recall this reaction  . Diltiazem Hcl Itching and Rash     Procedures This Admission:  1.  Implantation of a STJ single chamber PPM on 04-22-14 by Dr [name].  The patient received a STJ PPM with model number 7622 right ventricular lead. There were no immediate post procedure complications. 2.  CXR on 04-23-14 demonstrated no pneumothorax status post device implantation.   Brief HPI: Sierra Kaiser is a 81 y.o. female with a past medical history as outlined above.  She has failed prior PVI done at [facility] and subsequently amiodarone and Tikosyn for rhythm control of her atrial fibrillation.  Rates during AF are fast and therapy has been limited by hypotension.  Treatment options discussed with patient with plan for PPM implantation followed by AV node ablation.  Risks, benefits, and alternatives to PPM implantation were reviewed with the patient who wished to proceed.   Hospital Course:  The patient was admitted and underwent implantation of a STJ single chamber pacemaker with details as outlined above.  She  was monitored on telemetry overnight which demonstrated atrial fibrillation.  Left chest was without hematoma or ecchymosis.  The device was interrogated and found to be functioning  normally.  CXR was obtained and demonstrated no pneumothorax status post device implantation.  Wound care, arm mobility, and restrictions were reviewed with the patient.  Dr [name] examined the patient and considered them stable for discharge to home.   AV node ablation is scheduled for 05/06/14 at 10:30Am.  Pt to arrive at short stay at 9AM day of procedure.  NPO after midnight.     Discharge Vitals: Blood pressure 118/71, pulse 94, temperature 98.2 F (36.8 C), temperature source Oral, resp. rate 12, height 5\' 8"  (1.727 m), weight 156 lb 12.8 oz (71.124 kg), SpO2 98 %.  Well developed and nourished in no acute distress HENT normal Neck supple cleara Device pocket without infection Regular rate and rhythm, no murmurs or gallops Abd-soft with active BS No Clubbing cyanosis edema Skin-warm and dry A & Oriented  Grossly normal sensory and motor function   Labs:   Lab Results  Component Value Date   WBC 4.7 04/23/2014   HGB 10.4* 04/23/2014   HCT 32.2* 04/23/2014   MCV 85.9 04/23/2014   PLT 175 04/23/2014     Recent Labs Lab 04/22/14 1154  NA 141  K 4.1  CL 111  CO2 24  BUN 26*  CREATININE 1.37*  CALCIUM 9.3  GLUCOSE 98    Discharge Medications:    Medication List    TAKE these medications        digoxin 0.125 MG tablet  Commonly known as:  LANOXIN  Take 0.5 tablets (0.0625 mg total) by mouth every other day.     diphenhydrAMINE-zinc acetate cream  Commonly known as:  BENADRYL  Apply 1 application topically 2 (two) times daily as needed for itching.     furosemide 20 MG tablet  Commonly known as:  LASIX  Take 1 tablet (20 mg total) by mouth daily as needed.     HAIR/SKIN/NAILS/BIOTIN Tabs  Take 1 tablet by mouth daily at 2 PM daily at 2 PM.     ibuprofen 200 MG tablet  Commonly known as:  ADVIL,MOTRIN  Take 400 mg by mouth at bedtime as needed (sleep).     metoprolol tartrate 25 MG tablet  Commonly known as:  LOPRESSOR  Take 12.5 mg by mouth 2 (two)  times daily.     omeprazole 20 MG capsule  Commonly known as:  PRILOSEC  Take 20 mg by mouth daily.     Rivaroxaban 15 MG Tabs tablet  Commonly known as:  XARELTO  Take 1 tablet (15 mg total) by mouth daily with supper.     VITAMIN B 12 PO  Take 1,000 mcg by mouth daily.     vitamin B-12 1000 MCG tablet  Commonly known as:  CYANOCOBALAMIN  Take 1,000 mcg by mouth daily.        Disposition:   Follow-up Information    Follow up with [facility] On 05/06/2014.   Specialty:  Cardiology   Why:  10:00am   Contact information:   [address] [place] [address] [phone]      Duration of Discharge Encounter: Greater than 30 minutes including physician time.  Signed,    Discharge with anticipated readmission for AV ablation in two weeks

## 2014-04-23 NOTE — Discharge Instructions (Signed)
° ° °  Supplemental Discharge Instructions for  Pacemaker/Defibrillator Patients  Activity No heavy lifting or vigorous activity with your left/right arm for 6 to 8 weeks.  Do not raise your left/right arm above your head for one week.  Gradually raise your affected arm as drawn below.           __2/20                       2/21                          2/22                      2/23  NO DRIVING for  48 hours   ; you may begin driving on  5/85/2778   .  WOUND CARE - Keep the wound area clean and dry.  Do not get this area wet for one week. No showers for one week; you may shower on 04/30/2014    . - The tape/steri-strips on your wound will fall off; do not pull them off.  No bandage is needed on the site.  DO  NOT apply any creams, oils, or ointments to the wound area. - If you notice any drainage or discharge from the wound, any swelling or bruising at the site, or you develop a fever > 101? F after you are discharged home, call the office at once.  Special Instructions - You are still able to use cellular telephones; use the ear opposite the side where you have your pacemaker/defibrillator.  Avoid carrying your cellular phone near your device. - When traveling through airports, show security personnel your identification card to avoid being screened in the metal detectors.  Ask the security personnel to use the hand wand. - Avoid arc welding equipment, MRI testing (magnetic resonance imaging), TENS units (transcutaneous nerve stimulators).  Call the office for questions about other devices. - Avoid electrical appliances that are in poor condition or are not properly grounded. - Microwave ovens are safe to be near or to operate.  Additional information for defibrillator patients should your device go off: - If your device goes off ONCE and you feel fine afterward, notify the device clinic nurses. - If your device goes off ONCE and you do not feel well afterward, call 911. - If your device  goes off TWICE, call 911. - If your device goes off THREE times in one day, call 911.  DO NOT DRIVE YOURSELF OR A FAMILY MEMBER WITH A DEFIBRILLATOR TO THE HOSPITAL--CALL 911.   ++Please come back to Short Stay on 3/2 at 9:30am for AV nodal ablation, nothing by mouth after midnight prior to procedure. You can stay on Xarelto

## 2014-05-05 MED ORDER — SODIUM CHLORIDE 0.9 % IV SOLN: INTRAVENOUS | Status: DC

## 2014-05-06 ENCOUNTER — Ambulatory Visit: Payer: Medicare Other

## 2014-05-06 ENCOUNTER — Encounter (HOSPITAL_COMMUNITY): Payer: Self-pay

## 2014-05-06 ENCOUNTER — Encounter (HOSPITAL_COMMUNITY)
Admission: RE | Disposition: A | Discharge status: Home / Self Care | Payer: Self-pay | Source: Ambulatory Visit | Attending: Internal Medicine

## 2014-05-06 ENCOUNTER — Ambulatory Visit (HOSPITAL_COMMUNITY)
Admission: RE | Admit: 2014-05-06 | Discharge: 2014-05-07 | Disposition: A | Discharge status: Home / Self Care | Payer: Medicare Other | Source: Ambulatory Visit | Attending: Internal Medicine | Admitting: Internal Medicine

## 2014-05-06 DIAGNOSIS — N189 Chronic kidney disease, unspecified: Secondary | ICD-10-CM | POA: Insufficient documentation

## 2014-05-06 DIAGNOSIS — I4821 Permanent atrial fibrillation: Secondary | ICD-10-CM | POA: Diagnosis present

## 2014-05-06 DIAGNOSIS — I509 Heart failure, unspecified: Secondary | ICD-10-CM | POA: Insufficient documentation

## 2014-05-06 DIAGNOSIS — Z950 Presence of cardiac pacemaker: Secondary | ICD-10-CM | POA: Insufficient documentation

## 2014-05-06 DIAGNOSIS — I340 Nonrheumatic mitral (valve) insufficiency: Secondary | ICD-10-CM | POA: Insufficient documentation

## 2014-05-06 DIAGNOSIS — I498 Other specified cardiac arrhythmias: Secondary | ICD-10-CM | POA: Diagnosis present

## 2014-05-06 DIAGNOSIS — I959 Hypotension, unspecified: Secondary | ICD-10-CM | POA: Insufficient documentation

## 2014-05-06 DIAGNOSIS — I4891 Unspecified atrial fibrillation: Secondary | ICD-10-CM

## 2014-05-06 DIAGNOSIS — I442 Atrioventricular block, complete: Secondary | ICD-10-CM

## 2014-05-06 DIAGNOSIS — Z792 Long term (current) use of antibiotics: Secondary | ICD-10-CM | POA: Insufficient documentation

## 2014-05-06 DIAGNOSIS — R0609 Other forms of dyspnea: Secondary | ICD-10-CM | POA: Insufficient documentation

## 2014-05-06 DIAGNOSIS — Z8679 Personal history of other diseases of the circulatory system: Secondary | ICD-10-CM

## 2014-05-06 DIAGNOSIS — I482 Chronic atrial fibrillation: Secondary | ICD-10-CM | POA: Insufficient documentation

## 2014-05-06 DIAGNOSIS — F419 Anxiety disorder, unspecified: Secondary | ICD-10-CM | POA: Insufficient documentation

## 2014-05-06 DIAGNOSIS — Z7901 Long term (current) use of anticoagulants: Secondary | ICD-10-CM | POA: Insufficient documentation

## 2014-05-06 DIAGNOSIS — E785 Hyperlipidemia, unspecified: Secondary | ICD-10-CM | POA: Insufficient documentation

## 2014-05-06 DIAGNOSIS — Z9889 Other specified postprocedural states: Secondary | ICD-10-CM

## 2014-05-06 HISTORY — PX: AV NODE ABLATION: SHX5458

## 2014-05-06 SURGERY — AV NODE ABLATION

## 2014-05-06 MED ORDER — ONDANSETRON HCL 4 MG/2ML IJ SOLN: 4.0000 mg | Freq: Four times a day (QID) | INTRAMUSCULAR | Status: DC | PRN

## 2014-05-06 MED ORDER — PANTOPRAZOLE SODIUM 40 MG PO TBEC
40.0000 mg | DELAYED_RELEASE_TABLET | Freq: Every day | ORAL | Status: DC
  Administered 2014-05-06 – 2014-05-07 (×2): 40 mg via ORAL
  Filled 2014-05-06 (×2): qty 1

## 2014-05-06 MED ORDER — SODIUM CHLORIDE 0.9 % IJ SOLN: 3.0000 mL | INTRAMUSCULAR | Status: DC | PRN

## 2014-05-06 MED ORDER — RIVAROXABAN 15 MG PO TABS
15.0000 mg | ORAL_TABLET | Freq: Every day | ORAL | Status: DC
  Administered 2014-05-06: 15 mg via ORAL
  Filled 2014-05-06: qty 1

## 2014-05-06 MED ORDER — BUPIVACAINE HCL (PF) 0.25 % IJ SOLN
INTRAMUSCULAR | Status: AC
  Filled 2014-05-06: qty 60

## 2014-05-06 MED ORDER — SODIUM CHLORIDE 0.9 % IV SOLN: 250.0000 mL | INTRAVENOUS | Status: AC | PRN

## 2014-05-06 MED ORDER — ACETAMINOPHEN 325 MG PO TABS
650.0000 mg | ORAL_TABLET | ORAL | Status: DC | PRN
  Administered 2014-05-06: 650 mg via ORAL
  Filled 2014-05-06: qty 2

## 2014-05-06 MED ORDER — SODIUM CHLORIDE 0.9 % IJ SOLN
3.0000 mL | Freq: Two times a day (BID) | INTRAMUSCULAR | Status: DC
  Administered 2014-05-06: 3 mL via INTRAVENOUS

## 2014-05-06 MED ORDER — MIDAZOLAM HCL 5 MG/5ML IJ SOLN
INTRAMUSCULAR | Status: AC
  Filled 2014-05-06: qty 5

## 2014-05-06 MED ORDER — FENTANYL CITRATE 0.05 MG/ML IJ SOLN
INTRAMUSCULAR | Status: AC
  Filled 2014-05-06: qty 2

## 2014-05-06 NOTE — H&P (View-Only) (Signed)
Patient Care Team: Bari Edward, MD as PCP - General (Internal Medicine)   HPI  Sierra Kaiser is a 81 y.o. female Seen in followup for now permanent atrial fibrillation. She underwent pulmonary vein ablation at [facility] 10/14. She's been treated with dofetilide. She underwent cardioversion 3/15  Rate control medications were challenge because of hypotension. She had in fact syncope attributed to low blood pressure.    At the last visit she was started on amio having previously been started on dig.  Level was 1.3  >BUNCr also >>28/1.7 GFR 28 <<40  repeat blood work not in our system was reviewed. Renal function was much improved and 1.3 range and potassium was less than 5. \ Major complaint remains dyspnea on exertion. This is variable.   An echocardiogram 11/15 demonstrated normal left ventricular functionand mild LAE  (43/2.4) with mild mod MR  Past Medical History  Diagnosis Date  . A-fib   . CHF (congestive heart failure)   . Hypotension   . Hyperlipidemia   . Raynaud's disease   . Meniere's disease   . Diverticulitis large intestine   . GI bleeding   . Anxiety   . Urinary incontinence, urge   . Bradycardia   . Colitis   . CKD (chronic kidney disease)   . SNHL (sensorineural hearing loss)   . Tinnitus   . 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 Kidney infection     Past Surgical History  Procedure Laterality Date  . Cardiac electrophysiology study and ablation    . Transesophageal echocardiogram with cardioversion      [facility]  . Gallbladder surgery    . Vaginal hysterectomy    . Hemorrhoid surgery    . Upper gi endoscopy    . Appendectomy    . Cardiac catheterization  2014    [facility]    Current Outpatient Prescriptions  Medication Sig Dispense Refill  . BIOTIN PO Take by mouth daily.    . ciprofloxacin (CIPRO) 500 MG tablet Take 500 mg by mouth 2 (two) times daily.     . Cyanocobalamin (VITAMIN B 12 PO) Take 1,000 mcg by mouth daily.    . digoxin  (LANOXIN) 0.125 MG tablet Take 0.5 tablets (0.0625 mg total) by mouth every other day.    . furosemide (LASIX) 20 MG tablet Take 1 tablet (20 mg total) by mouth daily as needed. 30 tablet 6  . metoprolol tartrate (LOPRESSOR) 25 MG tablet Take 1 tablet (25 mg total) by mouth 2 (two) times daily. 180 tablet 3  . omeprazole (PRILOSEC) 20 MG capsule Take 20 mg by mouth as needed.     . Rivaroxaban (XARELTO) 15 MG TABS tablet Take 1 tablet (15 mg total) by mouth daily with supper. 90 tablet 3   No current facility-administered medications for this visit.    Allergies  Allergen Reactions  . Ciprofloxacin   . Contrast Media [Iodinated Diagnostic Agents]     Hives    . Diltiazem Hcl   . Metronidazole     Review of Systems negative except from HPI and PMH  Physical Exam BP 104/59 mmHg  Pulse 92  Ht 5\' 8"  (1.727 m)  Wt 153 lb 4 oz (69.514 kg)  BMI 23.31 kg/m2 Well developed and well nourished in no acute distress HENT normal E scleral and icterus clear Neck Supple JVP flat; carotids brisk and full Clear to ausculation Irregular rate and rhythm, no murmurs gallops or rub Soft with active bowel sounds  No clubbing cyanosis  Edema Alert and oriented, grossly normal motor and sensory function Skin Warm and Dry  ECG demonstrates atrial fibrillation at 78   Assessment and  Plan  Atrial fibrillation-persistent  Hypotension  DOE  Amiodarone neurotoxicity  improved  MR-- mild mod  Iodinated contrast reaction    Dyspnea may be related to Afib with Rapid rate suggested by the ECG  SHe has had persistent atrial fibrillation. She failed dofetilide and has amiodarone neurotoxicity so the alternative strategies would either be repeat ablation or rate control.  To this and we have discussed the role of Holter monitoring to assess bradycardic excursion; we will then undertake treadmill testing to look at tachycardia. Options may be that rate control with or without a pacing and with  or without AV junction ablatio Another potential issue with the exercise associated mitral regurgitation and if the aforementioned evaluation is un illuminating, we will undertake stress echo  We spent more than 50% of our >40  min visit in face to face counseling regarding the above

## 2014-05-06 NOTE — Progress Notes (Signed)
UR Completed Trezure Cronk Graves-Bigelow, RN,BSN [phone]  

## 2014-05-06 NOTE — CV Procedure (Signed)
Sierra Kaiser [id number]  [id number]  Preop Dx: afib uncontrolled ventricular response and BP limitations of med Postop Dx same/   Procedure:His recording AV ablation  After routine prep and drape, catheterization viaRF vein with 72F sheath.  4 MM tip catheter to AV node HV 46 msec  2'12" RF applied with complete hearet block and junctional escape evident at 33 BPM  Long sheath removed and transferred to holding for sheath removal  Cx: None  EBL: Minimal     Virl Axe, MD 05/06/2014 11:42 AM

## 2014-05-06 NOTE — Discharge Summary (Signed)
ELECTROPHYSIOLOGY PROCEDURE DISCHARGE SUMMARY    Patient ID: Sierra Kaiser,  MRN: [id number], DOB/AGE: [birth year] 81 y.o.  Admit date: 05/06/2014 Discharge date: 05/07/2014  Primary Care Physician: Bari Edward, MD Primary Cardiologist: Rockey Situ Electrophysiologist: [name]  Primary Discharge Diagnosis:  Atrial fibrillation with RVR s/p AVN ablation this admission  Secondary Discharge Diagnosis:  1.  Moderate MR 2.  Amiodarone neurotoxicity 3.  Hypotension 4.  CKD  Allergies  Allergen Reactions  . Contrast Media [Iodinated Diagnostic Agents] Hives       . Metronidazole Itching and Other (See Comments)    Hands really red  . Diltiazem Hcl Itching and Rash     Procedures This Admission:  1.  AV node ablation on 05/06/14 by Dr [name].  This demonstrated successful AV node ablation with no early apparent complications.   Brief HPI: Sierra Kaiser is a 81 y.o. female with a past medical history as outlined above. She has failed prior PVI done at [facility] and subsequently amiodarone and Tikosyn for rhythm control of her atrial fibrillation. Rates during AF are fast and therapy has been limited by hypotension. Treatment options discussed with patient with plan for PPM implantation followed by AV node ablation. She underwent pacemaker implantation 04/22/14 and returned this admission for planned AV node ablation.  Risks, benefits, and alternatives were discussed with the patient who wished to proceed.    Hospital Course:  The patient was admitted and underwent AV node ablation with details as outlined above. She was monitored on telemetry overnight which demonstrated AF with V pacing at 90.  Her groin was without complication.  She was evaluated and considered stable for discharge to home.  Her pacemaker site was without hematoma/ecchymosis.  Device was interrogated and found to be functioning normally.  She will be seen in 2 weeks in the device clinic as part of AVN ablation  protocol.   Physical Exam: Filed Vitals:   05/06/14 1400 05/06/14 1430 05/06/14 2000 05/07/14 0500  BP: 95/67 111/64 113/69 113/76  Pulse: 90 90 90 90  Temp:   98 F (36.7 C) 97.4 F (36.3 C)  TempSrc:   Oral Oral  Resp: 17 16    Height:    5\' 8"  (1.727 m)  Weight:    153 lb 6.4 oz (69.582 kg)  SpO2: 99% 98% 96% 96%    GEN- The patient is well appearing, alert and oriented x 3 today.   HEENT: normocephalic, atraumatic; sclera clear, conjunctiva pink; hearing intact; oropharynx clear; neck supple, no JVP Lymph- no cervical lymphadenopathy Lungs- Clear to ausculation bilaterally, normal work of breathing.  No wheezes, rales, rhonchi Heart- Regular rate and rhythm, no murmurs, rubs or gallops  GI- soft, non-tender, non-distended, bowel sounds present  Extremities- no clubbing, cyanosis, or edema; DP/PT/radial pulses 2+ bilaterally, groin was without hematoma/bruit MS- no significant deformity or atrophy Skin- warm and dry, no rash or lesion Psych- euthymic mood, full affect Neuro- strength and sensation are intact    Labs:   Lab Results  Component Value Date   WBC 4.7 04/23/2014   HGB 10.4* 04/23/2014   HCT 32.2* 04/23/2014   MCV 85.9 04/23/2014   PLT 175 04/23/2014   No results for input(s): NA, K, CL, CO2, BUN, CREATININE, CALCIUM, PROT, BILITOT, ALKPHOS, ALT, AST, GLUCOSE in the last 168 hours.  Invalid input(s): LABALBU   Discharge Medications:    Medication List    STOP taking these medications        digoxin  0.125 MG tablet  Commonly known as:  LANOXIN      TAKE these medications        diphenhydrAMINE-zinc acetate cream  Commonly known as:  BENADRYL  Apply 1 application topically 2 (two) times daily as needed for itching.     furosemide 20 MG tablet  Commonly known as:  LASIX  Take 1 tablet (20 mg total) by mouth daily as needed.     HAIR/SKIN/NAILS/BIOTIN Tabs  Take 1 tablet by mouth daily at 2 PM daily at 2 PM.     ibuprofen 200 MG tablet   Commonly known as:  ADVIL,MOTRIN  Take 400 mg by mouth 2 (two) times daily as needed (pain).     metoprolol tartrate 25 MG tablet  Commonly known as:  LOPRESSOR  Take 12.5 mg by mouth 2 (two) times daily.     miconazole 2 % vaginal cream  Commonly known as:  MONISTAT 7  Place 1 Applicatorful vaginally at bedtime.     omeprazole 20 MG capsule  Commonly known as:  PRILOSEC  Take 20 mg by mouth every morning.     Rivaroxaban 15 MG Tabs tablet  Commonly known as:  XARELTO  Take 1 tablet (15 mg total) by mouth daily with supper.     SYSTANE OP  Place 1 drop into both eyes daily as needed (dry eyes).     VITAMIN B 12 PO  Take 1,000 mcg by mouth daily.     vitamin B-12 1000 MCG tablet  Commonly known as:  CYANOCOBALAMIN  Take 1,000 mcg by mouth every morning.        Disposition:   Follow-up Information    Follow up with [facility] On 05/19/2014.   Why:  at [place] with the device clinic   Contact information:   [place] [id number]       Follow up with Virl Axe, MD On 08/11/2014.   Specialty:  Cardiology   Why:  at [facility] information:   [place] [id number] [phone]       Follow up with [facility] On 06/02/2014.   Why:  at 9:15 with device clinic   Contact information:   [place] [address] [place] [address]       Duration of Discharge Encounter: Greater than 30 minutes including physician time.  Signed, Chanetta Marshall, NP 05/07/2014 7:37 AM   REVIEWED   DISCHARGE

## 2014-05-06 NOTE — Interval H&P Note (Signed)
History and Physical Interval Note:  05/06/2014 10:11 AM  Sierra Kaiser  has presented today for surgery, with the diagnosis of afib  The various methods of treatment have been discussed with the patient and family. After consideration of risks, benefits and other options for treatment, the patient has consented to  Procedure(s): AV NODE ABLATION (N/A) as a surgical intervention .  The patient's history has been reviewed, patient examined, no change in status, stable for surgery.  I have reviewed the patient's chart and labs.  Questions were answered to the patient's satisfaction.     Virl Axe

## 2014-05-07 DIAGNOSIS — I442 Atrioventricular block, complete: Secondary | ICD-10-CM

## 2014-05-07 DIAGNOSIS — I482 Chronic atrial fibrillation: Secondary | ICD-10-CM | POA: Diagnosis not present

## 2014-05-12 ENCOUNTER — Telehealth: Payer: Self-pay | Admitting: *Deleted

## 2014-05-12 NOTE — Telephone Encounter (Signed)
Discussed patient complaint with Dr. [name]  Informed Dr. [name] that patient stated her SOB and flutter feeling in her chest have "wosened a lot since the ablation  Patient states she did not feel she is in an emergent situation   Dr. [name] advised patient be scheduled to see him tomorrow  Patient verbalized understanding  Appt scheduled for tomorrow   Patient instructed to contact EMS if her symptoms worsen

## 2014-05-12 NOTE — Telephone Encounter (Signed)
Pt c/o Shortness Of Breath: STAT if SOB developed within the last 24 hours or pt is noticeably SOB on the phone  1. Are you currently SOB (can you hear that pt is SOB on the phone)? Can't really hear it but you can tell she is   2. How long have you been experiencing SOB? Since the ablation   3. Are you SOB when sitting or when up moving around? When moving around only. Slept well. But   4. Are you currently experiencing any other symptoms? Yesterday metoprolol thinking it might help.

## 2014-05-13 ENCOUNTER — Ambulatory Visit (INDEPENDENT_AMBULATORY_CARE_PROVIDER_SITE_OTHER): Payer: Medicare Other | Admitting: Internal Medicine

## 2014-05-13 VITALS — BP 122/78 | HR 98 | Ht 68.0 in | Wt 153.0 lb

## 2014-05-13 DIAGNOSIS — I481 Persistent atrial fibrillation: Secondary | ICD-10-CM

## 2014-05-13 DIAGNOSIS — Z45018 Encounter for adjustment and management of other part of cardiac pacemaker: Secondary | ICD-10-CM | POA: Diagnosis not present

## 2014-05-13 DIAGNOSIS — I4819 Other persistent atrial fibrillation: Secondary | ICD-10-CM

## 2014-05-13 DIAGNOSIS — I495 Sick sinus syndrome: Secondary | ICD-10-CM

## 2014-05-13 LAB — MDC_IDC_ENUM_SESS_TYPE_INCLINIC
Brady Statistic RV Percent Paced: 99 %
Implantable Pulse Generator Model: 2240
Implantable Pulse Generator Serial Number: 3050195
Lead Channel Impedance Value: 630 Ohm
Lead Channel Pacing Threshold Amplitude: 0.625 V
Lead Channel Pacing Threshold Pulse Width: 0.4 ms
Lead Channel Sensing Intrinsic Amplitude: 12 mV
Lead Channel Setting Pacing Amplitude: 0.875
Lead Channel Setting Pacing Pulse Width: 0.4 ms

## 2014-05-13 MED ORDER — FUROSEMIDE 20 MG PO TABS: 20.0000 mg | ORAL_TABLET | Freq: Every day | ORAL | Status: DC

## 2014-05-13 MED ORDER — FUROSEMIDE 20 MG PO TABS: 20.0000 mg | ORAL_TABLET | Freq: Every day | ORAL | Status: DC | PRN

## 2014-05-13 NOTE — Progress Notes (Signed)
Patient Care Team: Bari Edward, MD as PCP - General (Internal Medicine)   HPI  Sierra Kaiser is a 81 y.o. female Is seen following AV junction ablation was accomplished a couple of weeks ago because of poorly controlled atrial fibrillation. She had been feeling some better and we went after some deliberation forward.  Since she went home she has been feeling terrible with increasing dyspnea on exertion and some worsening of peripheral edema.  She comes in today with as an add-on with the aforementioned complaints  Echocardiogram 11/15 demonstrated normal LV function and mild-moderate MR  Past Medical History  Diagnosis Date  . A-fib     a. permanent b. s/p PVI at [facility] 10/14 c. failed amio (neuro toxicity) and Tikosyn d. 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 Hypotension   . Hyperlipidemia   . Raynaud's disease   . Meniere's disease   . Diverticulitis large intestine   . GI bleeding   . Anxiety   . Urinary incontinence, urge   . Colitis   . CKD (chronic kidney disease)   . SNHL (sensorineural hearing loss)   . Tinnitus   . Kidney infection     Past Surgical History  Procedure Laterality Date  . Cardiac electrophysiology study and ablation    . Transesophageal echocardiogram with cardioversion      [facility]  . Gallbladder surgery    . Vaginal hysterectomy    . Hemorrhoid surgery    . Upper gi endoscopy    . Appendectomy    . Cardiac catheterization  2014    [facility]  . Permanent pacemaker insertion N/A 04/22/2014    STJ single chamber pacemaker implanted by Dr [name]  . Av node ablation N/A 05/06/2014    Procedure: AV NODE ABLATION;  Surgeon: [name], MD;  Location: [facility] CATH LAB;  Service: Cardiovascular;  Laterality: N/A;    Current Outpatient Prescriptions  Medication Sig Dispense Refill  . Cyanocobalamin (VITAMIN B 12 PO) Take 1,000 mcg by mouth daily.    . furosemide (LASIX) 20 MG tablet Take 1 tablet (20 mg  total) by mouth daily as needed. (Patient taking differently: Take 20 mg by mouth daily as needed for fluid or edema. ) 30 tablet 6  . metoprolol tartrate (LOPRESSOR) 25 MG tablet Take 12.5 mg by mouth 2 (two) times daily.  180 tablet 3  . Multiple Vitamins-Minerals (HAIR/SKIN/NAILS/BIOTIN) TABS Take 1 tablet by mouth daily at 2 PM daily at 2 PM.    . omeprazole (PRILOSEC) 20 MG capsule Take 20 mg by mouth every morning.     [name] Glycol-Propyl Glycol (SYSTANE OP) Place 1 drop into both eyes daily as needed (dry eyes).    . Rivaroxaban (XARELTO) 15 MG TABS tablet Take 1 tablet (15 mg total) by mouth daily with supper. 90 tablet 3  . vitamin B-12 (CYANOCOBALAMIN) 1000 MCG tablet Take 1,000 mcg by mouth every morning.     . diphenhydrAMINE-zinc acetate (BENADRYL) cream Apply 1 application topically 2 (two) times daily as needed for itching.    Marland Kitchen ibuprofen (ADVIL,MOTRIN) 200 MG tablet Take 400 mg by mouth 2 (two) times daily as needed (pain).     . miconazole (MONISTAT 7) 2 % vaginal cream Place 1 Applicatorful vaginally at bedtime.     No current facility-administered medications for this visit.    Allergies  Allergen Reactions  . Contrast Media [Iodinated Diagnostic Agents] Hives       .  Metronidazole Itching and Other (See Comments)    Hands really red  . Diltiazem Hcl Itching and Rash    Review of Systems negative except from HPI and PMH  Physical Exam BP 122/78 mmHg  Pulse 98  Ht 5\' 8"  (1.727 m)  Wt 153 lb (69.4 kg)  BMI 23.27 kg/m2 Well developed and well nourished in no acute distress HENT normal E scleral and icterus clear Neck Supple JVP 8-9 carotids brisk and full Clear to ausculation  Regular rate and rhythm, no murmurs gallops or rub Soft with active bowel sounds No clubbing cyanosis 1+ Edema Alert and oriented, grossly normal motor and sensory function Skin Warm and Dry    Assessment and  Plan  Atrial fibrillation-permanent  Complete heart block  status post AV junction ablation  Pacemaker-[employer] Jude  Dyspnea on exertion/HFpEF  It is not clear why it is that her heart failure is worse; she is modestly volume overloaded. We will resume her diuretics meter take Lasix 5 days in a row at 20 mg and then start every other day.  We will repeat the ultrasound to look at mitral regurgitation as now asynchronous contraction could have aggravated her MR. In the event that this is so we will need to consider CRT upgrade to see if we can't improve valvular function  We will decrease her pacer rate today from 90-80.

## 2014-05-13 NOTE — Patient Instructions (Addendum)
Your physician has recommended you make the following change in your medication:  1) Furosemide - Take 20 mg daily for 5 days -- then take 20 mg every other day until follow up with Dr. [name] and end of the month  Keep follow up with Dr. [name] on March 29th in [place].  Your physician has requested that you have an echocardiogram in [facility] before you see Dr. [name] on 06/02/14. Echocardiography is a painless test that uses sound waves to create images of your heart. It provides your doctor with information about the size and shape of your heart and how well your heart's chambers and valves are working. This procedure takes approximately one hour. There are no restrictions for this procedure.

## 2014-05-19 ENCOUNTER — Encounter: Payer: Self-pay | Admitting: Internal Medicine

## 2014-05-19 ENCOUNTER — Ambulatory Visit (INDEPENDENT_AMBULATORY_CARE_PROVIDER_SITE_OTHER): Payer: Medicare Other | Admitting: Internal Medicine

## 2014-05-19 VITALS — BP 112/74 | HR 81 | Ht 68.0 in | Wt 152.5 lb

## 2014-05-19 DIAGNOSIS — I482 Chronic atrial fibrillation: Secondary | ICD-10-CM

## 2014-05-19 DIAGNOSIS — I4821 Permanent atrial fibrillation: Secondary | ICD-10-CM

## 2014-05-19 LAB — MDC_IDC_ENUM_SESS_TYPE_INCLINIC
Battery Remaining Longevity: 124 mo
Implantable Pulse Generator Model: 2240
Implantable Pulse Generator Serial Number: 3050195
Lead Channel Impedance Value: 610 Ohm
Lead Channel Pacing Threshold Amplitude: 0.625 V
Lead Channel Pacing Threshold Pulse Width: 0.4 ms
Lead Channel Sensing Intrinsic Amplitude: 12 mV
Lead Channel Setting Pacing Amplitude: 0.875
Lead Channel Setting Pacing Pulse Width: 0.4 ms

## 2014-05-25 ENCOUNTER — Other Ambulatory Visit: Payer: Self-pay

## 2014-05-25 ENCOUNTER — Other Ambulatory Visit (INDEPENDENT_AMBULATORY_CARE_PROVIDER_SITE_OTHER): Payer: Medicare Other

## 2014-05-25 DIAGNOSIS — I481 Persistent atrial fibrillation: Secondary | ICD-10-CM

## 2014-05-25 DIAGNOSIS — I495 Sick sinus syndrome: Secondary | ICD-10-CM

## 2014-05-25 DIAGNOSIS — I4819 Other persistent atrial fibrillation: Secondary | ICD-10-CM

## 2014-05-25 NOTE — Progress Notes (Signed)
Patient Care Team: Bari Edward, MD as PCP - General (Internal Medicine)   HPI  Sierra Kaiser is a 81 y.o. female Seen in follow-up for AV ablation for permanent atrial fibrillation with poorly controlled ventricular response. Initially she was very short of breath and the device was reprogrammed last week. She returns today with less shortness of breath and considerably more energy.  Past Medical History  Diagnosis Date  . A-fib     a. permanent b. s/p PVI at [facility] 10/14 c. failed amio (neuro toxicity) and Tikosyn d. 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 Hypotension   . Hyperlipidemia   . Raynaud's disease   . Meniere's disease   . Diverticulitis large intestine   . GI bleeding   . Anxiety   . Urinary incontinence, urge   . Colitis   . CKD (chronic kidney disease)   . SNHL (sensorineural hearing loss)   . Tinnitus   . Kidney infection     Past Surgical History  Procedure Laterality Date  . Cardiac electrophysiology study and ablation    . Transesophageal echocardiogram with cardioversion      [facility]  . Gallbladder surgery    . Vaginal hysterectomy    . Hemorrhoid surgery    . Upper gi endoscopy    . Appendectomy    . Cardiac catheterization  2014    [facility]  . Permanent pacemaker insertion N/A 04/22/2014    STJ single chamber pacemaker implanted by Dr [name]  . Av node ablation N/A 05/06/2014    Procedure: AV NODE ABLATION;  Surgeon: [name], MD;  Location: [facility] CATH LAB;  Service: Cardiovascular;  Laterality: N/A;    Current Outpatient Prescriptions  Medication Sig Dispense Refill  . Cyanocobalamin (VITAMIN B 12 PO) Take 1,000 mcg by mouth daily.    . diphenhydrAMINE-zinc acetate (BENADRYL) cream Apply 1 application topically 2 (two) times daily as needed for itching.    . furosemide (LASIX) 20 MG tablet Take 1 tablet (20 mg total) by mouth daily as needed for fluid or edema. 30 tablet 6  . ibuprofen  (ADVIL,MOTRIN) 200 MG tablet Take 400 mg by mouth 2 (two) times daily as needed (pain).     . Multiple Vitamins-Minerals (HAIR/SKIN/NAILS/BIOTIN) TABS Take 1 tablet by mouth daily at 2 PM daily at 2 PM.    . omeprazole (PRILOSEC) 20 MG capsule Take 20 mg by mouth every morning.     [name] Glycol-Propyl Glycol (SYSTANE OP) Place 1 drop into both eyes daily as needed (dry eyes).    . Rivaroxaban (XARELTO) 15 MG TABS tablet Take 1 tablet (15 mg total) by mouth daily with supper. 90 tablet 3  . vitamin B-12 (CYANOCOBALAMIN) 1000 MCG tablet Take 1,000 mcg by mouth every morning.      No current facility-administered medications for this visit.    Allergies  Allergen Reactions  . Contrast Media [Iodinated Diagnostic Agents] Hives       . Metronidazole Itching and Other (See Comments)    Hands really red  . Diltiazem Hcl Itching and Rash    Review of Systems negative except from HPI and PMH  Physical Exam BP 112/74 mmHg  Pulse 81  Ht 5\' 8"  (1.727 m)  Wt 152 lb 8 oz (69.174 kg)  BMI 23.19 kg/m2 Well developed and well nourished in no acute distress HENT normal E scleral and icterus clear Neck Supple JVP flat; carotids  brisk and full Clear to ausculation  Regular rate and rhythm, no murmurs gallops or rub Soft with active bowel sounds No clubbing cyanosis  Edema Alert and oriented, grossly normal motor and sensory function Skin Warm and Dry    Assessment and  Plan  Atrial fibrillation  Complete heart block status post AV ablation  Dyspnea on exertion  She's feeling somewhat improved with the decreased heart rate to 80. We will reassess in a couple of weeks

## 2014-05-27 ENCOUNTER — Ambulatory Visit: Payer: Medicare Other | Admitting: Cardiovascular Disease

## 2014-06-02 ENCOUNTER — Ambulatory Visit (INDEPENDENT_AMBULATORY_CARE_PROVIDER_SITE_OTHER): Payer: Medicare Other | Admitting: Internal Medicine

## 2014-06-02 ENCOUNTER — Encounter: Payer: Self-pay | Admitting: Internal Medicine

## 2014-06-02 VITALS — BP 130/88 | HR 72 | Ht 68.0 in | Wt 156.0 lb

## 2014-06-02 DIAGNOSIS — I4821 Permanent atrial fibrillation: Secondary | ICD-10-CM

## 2014-06-02 DIAGNOSIS — I482 Chronic atrial fibrillation: Secondary | ICD-10-CM

## 2014-06-02 LAB — MDC_IDC_ENUM_SESS_TYPE_INCLINIC
Battery Remaining Longevity: 128.4 mo
Battery Voltage: 2.98 V
Brady Statistic RA Percent Paced: 0 %
Brady Statistic RV Percent Paced: 99.62 %
Date Time Interrogation Session: 20160329114429
Implantable Pulse Generator Model: 2240
Implantable Pulse Generator Serial Number: 3050195
Lead Channel Impedance Value: 625 Ohm
Lead Channel Pacing Threshold Amplitude: 0.75 V
Lead Channel Pacing Threshold Amplitude: 0.75 V
Lead Channel Pacing Threshold Pulse Width: 0.4 ms
Lead Channel Pacing Threshold Pulse Width: 0.4 ms
Lead Channel Sensing Intrinsic Amplitude: 12 mV
Lead Channel Setting Pacing Amplitude: 0.875
Lead Channel Setting Pacing Pulse Width: 0.4 ms
Lead Channel Setting Sensing Sensitivity: 4 mV

## 2014-06-02 MED ORDER — FUROSEMIDE 40 MG PO TABS: 40.0000 mg | ORAL_TABLET | ORAL | Status: DC

## 2014-06-02 NOTE — Patient Instructions (Signed)
Your physician has recommended you make the following change in your medication:  Change Lasix to 40 mg every other day   Your physician recommends that you schedule a follow-up appointment in:  8 weeks

## 2014-06-02 NOTE — Progress Notes (Signed)
Patient Care Team: Bari Edward, MD as PCP - General (Internal Medicine)   HPI  Sierra Kaiser is a 81 y.o. female Seen in follow-up for AV ablation for permanent atrial fibrillation with poorly controlled ventricular response. Initially she was very short of breath and the device was reprogrammed last week. She returns today with less shortness of breath and considerably more energy.  Echo 3/16 demonstrated normal LV function  with moderate MR. Echocardiogram 8/15 described Mild-moderate MR  She is also has complaints of orthopnea and scant edema. Her response to furosemide 20 is modest    Past Medical History  Diagnosis Date  . A-fib     a. permanent b. s/p PVI at [facility] 10/14 c. failed amio (neuro toxicity) and Tikosyn d. 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 Hypotension   . Hyperlipidemia   . Raynaud's disease   . Meniere's disease   . Diverticulitis large intestine   . GI bleeding   . Anxiety   . Urinary incontinence, urge   . Colitis   . CKD (chronic kidney disease)   . SNHL (sensorineural hearing loss)   . Tinnitus   . Kidney infection     Past Surgical History  Procedure Laterality Date  . Cardiac electrophysiology study and ablation    . Transesophageal echocardiogram with cardioversion      [facility]  . Gallbladder surgery    . Vaginal hysterectomy    . Hemorrhoid surgery    . Upper gi endoscopy    . Appendectomy    . Cardiac catheterization  2014    [facility]  . Permanent pacemaker insertion N/A 04/22/2014    STJ single chamber pacemaker implanted by Dr [name]  . Av node ablation N/A 05/06/2014    Procedure: AV NODE ABLATION;  Surgeon: [name], MD;  Location: [facility] CATH LAB;  Service: Cardiovascular;  Laterality: N/A;    Current Outpatient Prescriptions  Medication Sig Dispense Refill  . diphenhydrAMINE-zinc acetate (BENADRYL) cream Apply 1 application topically 2 (two) times daily as needed for itching.    .  furosemide (LASIX) 20 MG tablet Take 1 tablet (20 mg total) by mouth daily as needed for fluid or edema. 30 tablet 6  . ibuprofen (ADVIL,MOTRIN) 200 MG tablet Take 400 mg by mouth 2 (two) times daily as needed (pain).     . Multiple Vitamins-Minerals (HAIR/SKIN/NAILS/BIOTIN) TABS Take 1 tablet by mouth daily at 2 PM daily at 2 PM.    . omeprazole (PRILOSEC) 20 MG capsule Take 20 mg by mouth every morning.     [name] Glycol-Propyl Glycol (SYSTANE OP) Place 1 drop into both eyes daily as needed (dry eyes).    . Rivaroxaban (XARELTO) 15 MG TABS tablet Take 1 tablet (15 mg total) by mouth daily with supper. 90 tablet 3  . vitamin B-12 (CYANOCOBALAMIN) 1000 MCG tablet Take 1,000 mcg by mouth every morning.      No current facility-administered medications for this visit.    Allergies  Allergen Reactions  . Contrast Media [Iodinated Diagnostic Agents] Hives       . Metronidazole Itching and Other (See Comments)    Hands really red  . Diltiazem Hcl Itching and Rash    Review of Systems negative except from HPI and PMH  Physical Exam BP 130/88 mmHg  Pulse 72  Ht 5\' 8"  (1.727 m)  Wt 156 lb (70.761 kg)  BMI 23.73 kg/m2 Well developed  and well nourished in no acute distress HENT normal E scleral and icterus clear Neck Supple JVP flat; carotids brisk and full Clear to ausculation  Regular rate and rhythm, no murmurs gallops or rub Soft with active bowel sounds No clubbing cyanosis  Edema Alert and oriented, grossly normal motor and sensory function Skin Warm and Dry   ECG  oorered with afib  V pacing  Assessment and  Plan  Atrial fibrillation  Complete heart block status post AV ablation  Dyspnea on exertion and orthopnea  HFpEF stable but still problematic  she is somewhat volume overloaded  Normal LV function   MR mod  Will begin every other day diuretics for HFpEF. The issue of her MR is something we will have to keep track of.  Overall she is  improved.  Blood pressure reasonably controlled

## 2014-06-05 ENCOUNTER — Telehealth: Payer: Self-pay | Admitting: Internal Medicine

## 2014-06-05 NOTE — Telephone Encounter (Signed)
Notified of echo results.

## 2014-06-05 NOTE — Telephone Encounter (Signed)
Follow Up  Pt returning call. States that they are going out of town. Please call cell phone if it is an emergency.

## 2014-07-28 ENCOUNTER — Ambulatory Visit (INDEPENDENT_AMBULATORY_CARE_PROVIDER_SITE_OTHER): Payer: Medicare Other | Admitting: Internal Medicine

## 2014-07-28 ENCOUNTER — Encounter: Payer: Self-pay | Admitting: Internal Medicine

## 2014-07-28 VITALS — BP 110/60 | HR 70 | Ht 68.0 in | Wt 152.0 lb

## 2014-07-28 DIAGNOSIS — I482 Chronic atrial fibrillation: Secondary | ICD-10-CM

## 2014-07-28 DIAGNOSIS — I4821 Permanent atrial fibrillation: Secondary | ICD-10-CM

## 2014-07-28 LAB — CUP PACEART INCLINIC DEVICE CHECK
Battery Remaining Longevity: 129.6 mo
Battery Voltage: 3.01 V
Brady Statistic RA Percent Paced: 0 %
Brady Statistic RV Percent Paced: 99.92 %
Date Time Interrogation Session: 20160524162013
Lead Channel Impedance Value: 662.5 Ohm
Lead Channel Pacing Threshold Amplitude: 0.625 V
Lead Channel Pacing Threshold Pulse Width: 0.4 ms
Lead Channel Sensing Intrinsic Amplitude: 12 mV
Lead Channel Setting Pacing Amplitude: 0.875
Lead Channel Setting Pacing Pulse Width: 0.4 ms
Lead Channel Setting Sensing Sensitivity: 4 mV
Pulse Gen Model: 2240
Pulse Gen Serial Number: 3050195

## 2014-07-28 NOTE — Progress Notes (Signed)
Patient Care Team: Bari Edward, MD as PCP - General (Internal Medicine)   HPI  Sierra Kaiser is a 81 y.o. female Seen in follow-up for AV ablation for permanent atrial fibrillation with poorly controlled ventricular response. Initially she was very short of breath and the device was reprogrammed last week. She returns today with less shortness of breath and considerably more energy.  Echo 3/16 demonstrated normal LV function  with moderate MR. Echocardiogram 8/15 described Mild-moderate MR  Her dyspnea is significantly improved. She still has some shortness of breath when she is able to do most of her activities of daily living as well as working in the yard  She is tolerating her Rivaroxaban without questions are without bleeding  Past Medical History  Diagnosis Date  . A-fib     a. permanent b. s/p PVI at [facility] 10/14 c. failed amio (neuro toxicity) and Tikosyn d. 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 Hypotension   . Hyperlipidemia   . Raynaud's disease   . Meniere's disease   . Diverticulitis large intestine   . GI bleeding   . Anxiety   . Urinary incontinence, urge   . Colitis   . CKD (chronic kidney disease)   . SNHL (sensorineural hearing loss)   . Tinnitus   . Kidney infection     Past Surgical History  Procedure Laterality Date  . Cardiac electrophysiology study and ablation    . Transesophageal echocardiogram with cardioversion      [facility]  . Gallbladder surgery    . Vaginal hysterectomy    . Hemorrhoid surgery    . Upper gi endoscopy    . Appendectomy    . Cardiac catheterization  2014    [facility]  . Permanent pacemaker insertion N/A 04/22/2014    STJ single chamber pacemaker implanted by Dr [name]  . Av node ablation N/A 05/06/2014    Procedure: AV NODE ABLATION;  Surgeon: [name], MD;  Location: [facility] CATH LAB;  Service: Cardiovascular;  Laterality: N/A;    Current Outpatient Prescriptions  Medication  Sig Dispense Refill  . diphenhydrAMINE-zinc acetate (BENADRYL) cream Apply 1 application topically 2 (two) times daily as needed for itching.    . furosemide (LASIX) 40 MG tablet Take 1 tablet (40 mg total) by mouth every other day. 30 tablet 6  . ibuprofen (ADVIL,MOTRIN) 200 MG tablet Take 400 mg by mouth 2 (two) times daily as needed (pain).     Marland Kitchen omeprazole (PRILOSEC) 20 MG capsule Take 20 mg by mouth as needed.     [name] Glycol-Propyl Glycol (SYSTANE OP) Place 1 drop into both eyes daily as needed (dry eyes).    . Rivaroxaban (XARELTO) 15 MG TABS tablet Take 1 tablet (15 mg total) by mouth daily with supper. 90 tablet 3   No current facility-administered medications for this visit.    Allergies  Allergen Reactions  . Contrast Media [Iodinated Diagnostic Agents] Hives       . Metronidazole Itching and Other (See Comments)    Hands really red  . Diltiazem Hcl Itching and Rash    Review of Systems negative except from HPI and PMH  Physical Exam BP 110/60 mmHg  Pulse 70  Ht 5\' 8"  (1.727 m)  Wt 152 lb (68.947 kg)  BMI 23.12 kg/m2 Well developed and well nourished in no acute distress HENT normal E scleral and icterus clear Neck Supple JVP flat; carotids  brisk and full Clear to ausculation  Regular rate and rhythm, no murmurs gallops or rub Soft with active bowel sounds No clubbing cyanosis  Edema Alert and oriented, grossly normal motor and sensory function Skin Warm and Dry   ECG  oorered with afib  V pacing  Assessment and  Plan  Atrial fibrillation  Complete heart block status post AV ablation  Dyspnea on exertion and orthopnea  HFpEF stable but still problematic  she is somewhat volume overloaded  Normal LV function   MR mod  Will continue every other day diuretics for HFpEF. The issue of her MR is something we will have to keep track of.  Overall she is much improved  Blood pressure well controlled

## 2014-07-28 NOTE — Patient Instructions (Signed)
Medication Instructions:  Your physician recommends that you continue on your current medications as directed. Please refer to the Current Medication list given to you today.   Labwork: None  Testing/Procedures: None  Follow-Up: Your physician recommends that you schedule a follow-up appointment in: FOUR MONTHS with Dr. [name]   Any Other Special Instructions Will Be Listed Below (If Applicable).

## 2014-08-04 ENCOUNTER — Encounter: Payer: Medicare Other | Admitting: Internal Medicine

## 2014-08-11 ENCOUNTER — Encounter: Payer: Medicare Other | Admitting: Internal Medicine

## 2014-08-12 ENCOUNTER — Encounter: Payer: Medicare Other | Admitting: Internal Medicine

## 2014-08-24 ENCOUNTER — Encounter: Payer: Self-pay | Admitting: Internal Medicine

## 2014-10-06 ENCOUNTER — Telehealth: Payer: Self-pay

## 2014-10-06 NOTE — Telephone Encounter (Signed)
They would likely not touch her moderate MR with a mitral valve clip Not severe enough This can be treated medically Last echo March 2016 it was moderate

## 2014-10-06 NOTE — Telephone Encounter (Signed)
Pt daughter in law, called, states she would like Dr. Rockey Situ to check into seeing if pt is a good candidate for Mitro Clip Procedure. Please call. She is not on pt DPR, but we can call pt.

## 2014-10-07 NOTE — Telephone Encounter (Signed)
Spoke w/ pt.  Advised her of Dr. Donivan Scull recommendation.  She states that her aunt had a friend who had this procedure and she was interested.  She will keep her upcoming appt w/ Dr. Rockey Situ at the end of the month.

## 2014-10-27 ENCOUNTER — Encounter: Payer: Self-pay | Admitting: Cardiovascular Disease

## 2014-10-27 ENCOUNTER — Other Ambulatory Visit
Admission: RE | Admit: 2014-10-27 | Discharge: 2014-10-27 | Disposition: A | Discharge status: Home / Self Care | Payer: Medicare Other | Source: Ambulatory Visit | Attending: Cardiovascular Disease | Admitting: Cardiovascular Disease

## 2014-10-27 ENCOUNTER — Ambulatory Visit (INDEPENDENT_AMBULATORY_CARE_PROVIDER_SITE_OTHER): Payer: Medicare Other | Admitting: Cardiovascular Disease

## 2014-10-27 VITALS — BP 116/64 | HR 70 | Ht 68.0 in | Wt 150.5 lb

## 2014-10-27 DIAGNOSIS — I5032 Chronic diastolic (congestive) heart failure: Secondary | ICD-10-CM | POA: Diagnosis not present

## 2014-10-27 DIAGNOSIS — I482 Chronic atrial fibrillation: Secondary | ICD-10-CM | POA: Diagnosis not present

## 2014-10-27 DIAGNOSIS — R0602 Shortness of breath: Secondary | ICD-10-CM | POA: Diagnosis not present

## 2014-10-27 DIAGNOSIS — I4821 Permanent atrial fibrillation: Secondary | ICD-10-CM

## 2014-10-27 LAB — BASIC METABOLIC PANEL
Anion gap: 8 (ref 5–15)
BUN: 26 mg/dL — ABNORMAL HIGH (ref 6–20)
CO2: 25 mmol/L (ref 22–32)
Calcium: 9.6 mg/dL (ref 8.9–10.3)
Chloride: 109 mmol/L (ref 101–111)
Creatinine, Ser: 1.23 mg/dL — ABNORMAL HIGH (ref 0.44–1.00)
GFR calc Af Amer: 47 mL/min — ABNORMAL LOW (ref >60)
GFR calc non Af Amer: 41 mL/min — ABNORMAL LOW (ref >60)
Glucose, Bld: 94 mg/dL (ref 65–99)
Potassium: 4.1 mmol/L (ref 3.5–5.1)
Sodium: 142 mmol/L (ref 135–145)

## 2014-10-27 LAB — BRAIN NATRIURETIC PEPTIDE: B Natriuretic Peptide: 361 pg/mL — ABNORMAL HIGH (ref 0.0–100.0)

## 2014-10-27 MED ORDER — FUROSEMIDE 40 MG PO TABS: 40.0000 mg | ORAL_TABLET | Freq: Every day | ORAL | Status: DC | PRN

## 2014-10-27 NOTE — Assessment & Plan Note (Signed)
Likely multifactorial including underlying atrial fibrillation, RV paced rhythm, Mildly elevated right heart pressures on prior echocardiogram Also with significant stress recently, selling their house, moving in the heat. Recommended we check a BMP, BNP,  Take extra Lasix for shortness of breath in addition to every other day dosing

## 2014-10-27 NOTE — Patient Instructions (Signed)
You are doing well. No medication changes were made.  We will check lab work today, BMP and BNP  Please take extra lasix as needed for shortness of breath  Please call us if you have new issues that need to be addressed before your next appt.  Your physician wants you to follow-up in: 6 months.  You will receive a reminder letter in the mail two months in advance. If you don't receive a letter, please call our office to schedule the follow-up appointment.

## 2014-10-27 NOTE — Progress Notes (Signed)
Patient ID: Sierra Kaiser, female    DOB: 21-Nov-1933, 81 y.o.   MRN: [id number]  HPI Comments: [name] is a 81 year old woman with a long history of atrial fibrillation, previously followed at [facility],   long history of atrial fibrillation, trial on numerous medications. She has been on Tikosyn  Started 2012. Previous to that she was on Multaq.  She had atrial fibrillation ablation in October 2014. Previous episodes of syncope in 2013, March 2015. Possibly from low blood pressure Chronic renal insufficiency AV node ablation, now with pacemaker February 2016  In follow-up today, she reports that her breathing has improved. It is not back to her baseline as she would like bed much better. She wonders if there is anything she can do to make her breathing even better She has been taking her Lasix every other day, denies any leg edema, PND or orthopnea  Echocardiogram March 2016 showing moderate MR, mildly elevated right heart pressures estimated 40 mmHg  EKG shows paced rhythm with rate 70 bpm.  Other past medical history Previously on metoprolol and tikosyn. Changed to amiodarone in preparation for cardioversion She underwent cardioversion 11/12/2013 which was successful but she reports going back to atrial fibrillation the next day   Lab work from [facility] 09/28/2012 showing creatinine 1.55, BUN 24 BNP 4800 EKG 09/28/2012 showing atrial fibrillation with RVR, rate 137 beats per minute followup EKG showing normal sinus rhythm with rate 82 beats per minute   Echocardiogram January 2011 was essentially normal. Repeat echocardiogram September 2014 again with normal LV function, normal study  Cardioversion may 2012 Cardioversion 12/06/2012 cardiac cath 05/17/2007 showing no significant CAD Stress test September 2014 showing no significant ischemia, normal ejection fraction    Allergies  Allergen Reactions  . Contrast Media [Iodinated Diagnostic Agents] Hives       .  Metronidazole Itching and Other (See Comments)    Hands really red  . Diltiazem Hcl Itching and Rash    Current Outpatient Prescriptions on File Prior to Visit  Medication Sig Dispense Refill  . diphenhydrAMINE-zinc acetate (BENADRYL) cream Apply 1 application topically 2 (two) times daily as needed for itching.    Marland Kitchen ibuprofen (ADVIL,MOTRIN) 200 MG tablet Take 400 mg by mouth 2 (two) times daily as needed (pain).     Marland Kitchen omeprazole (PRILOSEC) 20 MG capsule Take 20 mg by mouth as needed.     [name] Glycol-Propyl Glycol (SYSTANE OP) Place 1 drop into both eyes daily as needed (dry eyes).    . Rivaroxaban (XARELTO) 15 MG TABS tablet Take 1 tablet (15 mg total) by mouth daily with supper. 90 tablet 3   No current facility-administered medications on file prior to visit.    Past Medical History  Diagnosis Date  . A-fib     a. permanent b. s/p PVI at [facility] 10/14 c. failed amio (neuro toxicity) and Tikosyn d. 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 Hypotension   . Hyperlipidemia   . Raynaud's disease   . Meniere's disease   . Diverticulitis large intestine   . GI bleeding   . Anxiety   . Urinary incontinence, urge   . Colitis   . CKD (chronic kidney disease)   . SNHL (sensorineural hearing loss)   . Tinnitus   . Kidney infection     Past Surgical History  Procedure Laterality Date  . Cardiac electrophysiology study and ablation    . Transesophageal echocardiogram with cardioversion  [facility]  . Gallbladder surgery    . Vaginal hysterectomy    . Hemorrhoid surgery    . Upper gi endoscopy    . Appendectomy    . Cardiac catheterization  2014    [facility]  . Permanent pacemaker insertion N/A 04/22/2014    STJ single chamber pacemaker implanted by Dr [name]  . Av node ablation N/A 05/06/2014    Procedure: AV NODE ABLATION;  Surgeon: [name], MD;  Location: [facility] CATH LAB;  Service: Cardiovascular;  Laterality: N/A;    Social History  reports  that she has never smoked. She has never used smokeless tobacco. She reports that she drinks about 1.2 oz of alcohol per week. She reports that she does not use illicit drugs.  Family History family history includes Arrhythmia in her mother; Heart failure in her brother and brother; Hypertension in her mother.     Review of Systems  Constitutional: Negative.   Respiratory: Positive for shortness of breath.   Cardiovascular: Negative.   Gastrointestinal: Negative.   Musculoskeletal: Negative.   Skin: Negative.   Neurological: Negative.        Feels jittery inside  Hematological: Negative.   Psychiatric/Behavioral: Negative.   All other systems reviewed and are negative.   BP 116/64 mmHg  Pulse 70  Ht 5\' 8"  (1.727 m)  Wt 150 lb 8 oz (68.266 kg)  BMI 22.89 kg/m2  Physical Exam  Constitutional: She is oriented to person, place, and time. She appears well-developed and well-nourished.  HENT:  Head: Normocephalic.  Nose: Nose normal.  Mouth/Throat: Oropharynx is clear and moist.  Eyes: Conjunctivae are normal. Pupils are equal, round, and reactive to light.  Neck: Normal range of motion. Neck supple. No JVD present.  Cardiovascular: Normal rate, S1 normal, S2 normal, normal heart sounds and intact distal pulses.  An irregularly irregular rhythm present. Exam reveals no gallop and no friction rub.   No murmur heard. Pulmonary/Chest: Effort normal and breath sounds normal. No respiratory distress. She has no wheezes. She has no rales. She exhibits no tenderness.  Abdominal: Soft. Bowel sounds are normal. She exhibits no distension. There is no tenderness.  Musculoskeletal: Normal range of motion. She exhibits no edema or tenderness.  Lymphadenopathy:    She has no cervical adenopathy.  Neurological: She is alert and oriented to person, place, and time. Coordination normal.  Skin: Skin is warm and dry. No rash noted. No erythema.  Psychiatric: She has a normal mood and affect. Her  behavior is normal. Judgment and thought content normal.    Assessment and Plan  Nursing note and vitals reviewed.

## 2014-10-27 NOTE — Assessment & Plan Note (Signed)
Recommended Lasix every other day, extra Lasix for shortness of breath symptoms

## 2014-10-27 NOTE — Assessment & Plan Note (Signed)
Tolerating anticoagulation Underlying atrial fibrillation likely contributing to her shortness of breath

## 2014-11-08 NOTE — Progress Notes (Signed)
Patient Care Team: No Pcp Per Patient as PCP - General (General Practice)   HPI  Sierra Kaiser is a 81 y.o. female Seen in follow-up for AV ablation for permanent atrial fibrillation with poorly controlled ventricular response. Initially she was very short of breath and the device was reprogrammed  There had been gradual improvement  Echo 3/16 demonstrated normal LV function  with moderate MR.  Echocardiogram 8/15 described Mild-moderate MR  Currently,  She is continuing to do better. She worked in the chart all day yesterday with a few breaks. She asks about whether we should do anything more about her mitral valve. She had TEE at [facility] from 10/14. This report was read no description of eccentric MR was noted.  She is tolerating her Rivaroxaban without questions are without bleeding  Past Medical History  Diagnosis Date  . A-fib     a. permanent b. s/p PVI at [facility] 10/14 c. failed amio (neuro toxicity) and Tikosyn d. 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 Hypotension   . Hyperlipidemia   . Raynaud's disease   . Meniere's disease   . Diverticulitis large intestine   . GI bleeding   . Anxiety   . Urinary incontinence, urge   . Colitis   . CKD (chronic kidney disease)   . SNHL (sensorineural hearing loss)   . Tinnitus   . Kidney infection     Past Surgical History  Procedure Laterality Date  . Cardiac electrophysiology study and ablation    . Transesophageal echocardiogram with cardioversion      [facility]  . Gallbladder surgery    . Vaginal hysterectomy    . Hemorrhoid surgery    . Upper gi endoscopy    . Appendectomy    . Cardiac catheterization  2014    [facility]  . Permanent pacemaker insertion N/A 04/22/2014    STJ single chamber pacemaker implanted by Dr [name]  . Av node ablation N/A 05/06/2014    Procedure: AV NODE ABLATION;  Surgeon: [name], MD;  Location: [facility] CATH LAB;  Service: Cardiovascular;  Laterality: N/A;     Current Outpatient Prescriptions  Medication Sig Dispense Refill  . diphenhydrAMINE-zinc acetate (BENADRYL) cream Apply 1 application topically 2 (two) times daily as needed for itching.    . furosemide (LASIX) 40 MG tablet Take 1 tablet (40 mg total) by mouth daily as needed. 30 tablet 11  . ibuprofen (ADVIL,MOTRIN) 200 MG tablet Take 400 mg by mouth 2 (two) times daily as needed (pain).     Marland Kitchen omeprazole (PRILOSEC) 20 MG capsule Take 20 mg by mouth as needed.     [name] Glycol-Propyl Glycol (SYSTANE OP) Place 1 drop into both eyes daily as needed (dry eyes).    . Rivaroxaban (XARELTO) 15 MG TABS tablet Take 1 tablet (15 mg total) by mouth daily with supper. 90 tablet 3   No current facility-administered medications for this visit.    Allergies  Allergen Reactions  . Contrast Media [Iodinated Diagnostic Agents] Hives       . Metronidazole Itching and Other (See Comments)    Hands really red  . Diltiazem Hcl Itching and Rash    Review of Systems negative except from HPI and PMH  Physical Exam There were no vitals taken for this visit. Well developed and well nourished in no acute distress HENT normal E scleral and icterus clear Neck Supple JVP flat; carotids brisk and  full Clear to ausculation  Regular rate and rhythm, no murmurs gallops or rub Soft with active bowel sounds No clubbing cyanosis  Edema Alert and oriented, grossly normal motor and sensory function Skin Warm and Dry   ECG atrial fibrillation with ventricular pacing  Assessment and  Plan  Atrial fibrillation  Complete heart block status post AV ablation  HFpEF   Normal LV function   MR mod  Will continue every other day diuretics for HFpEF. The issue of her MR is something we will have to keep track of.   with her functional status continuing to improve, we will hold off on further evaluation of her mitral regurgitation.  Euvolemic continue current meds

## 2014-11-10 ENCOUNTER — Ambulatory Visit (INDEPENDENT_AMBULATORY_CARE_PROVIDER_SITE_OTHER): Payer: Medicare Other | Admitting: Internal Medicine

## 2014-11-10 ENCOUNTER — Encounter: Payer: Self-pay | Admitting: Internal Medicine

## 2014-11-10 VITALS — BP 124/62 | HR 76 | Ht 68.0 in | Wt 148.2 lb

## 2014-11-10 DIAGNOSIS — I442 Atrioventricular block, complete: Secondary | ICD-10-CM | POA: Diagnosis not present

## 2014-11-10 DIAGNOSIS — I5032 Chronic diastolic (congestive) heart failure: Secondary | ICD-10-CM | POA: Diagnosis not present

## 2014-11-10 DIAGNOSIS — I340 Nonrheumatic mitral (valve) insufficiency: Secondary | ICD-10-CM

## 2014-11-10 DIAGNOSIS — I4821 Permanent atrial fibrillation: Secondary | ICD-10-CM

## 2014-11-10 DIAGNOSIS — I159 Secondary hypertension, unspecified: Secondary | ICD-10-CM | POA: Diagnosis not present

## 2014-11-10 DIAGNOSIS — I482 Chronic atrial fibrillation: Secondary | ICD-10-CM | POA: Diagnosis not present

## 2014-11-10 NOTE — Patient Instructions (Signed)
Medication Instructions:  Your physician recommends that you continue on your current medications as directed. Please refer to the Current Medication list given to you today.   Labwork: Your physician recommends that you have labs today: BMET   Testing/Procedures: none  Follow-Up: Your physician wants you to follow-up in: six months with Dr. [name].  You will receive a reminder letter in the mail two months in advance. If you don't receive a letter, please call our office to schedule the follow-up appointment.   Any Other Special Instructions Will Be Listed Below (If Applicable).

## 2014-11-11 ENCOUNTER — Encounter: Payer: Self-pay | Admitting: Internal Medicine

## 2014-11-11 LAB — BASIC METABOLIC PANEL: CO2: 15 mmol/L — ABNORMAL LOW (ref 18–29)

## 2014-11-17 LAB — CUP PACEART INCLINIC DEVICE CHECK
Date Time Interrogation Session: 20160913164941
Pulse Gen Model: 2240
Pulse Gen Serial Number: 3050195

## 2014-11-19 NOTE — Patient Instructions (Signed)
BMET scanned from [facility] Dr. [name] notified. Awaiting response from MD

## 2014-11-22 NOTE — Progress Notes (Signed)
Labs are pretty stable.

## 2014-11-23 ENCOUNTER — Telehealth: Payer: Self-pay

## 2014-11-23 NOTE — Telephone Encounter (Signed)
S/w pt regarding lab results. Pt verbalized understanding.

## 2014-11-27 ENCOUNTER — Encounter: Payer: Self-pay | Admitting: Internal Medicine

## 2014-12-25 ENCOUNTER — Other Ambulatory Visit: Payer: Self-pay | Admitting: *Deleted

## 2014-12-25 MED ORDER — FUROSEMIDE 40 MG PO TABS: 40.0000 mg | ORAL_TABLET | Freq: Every day | ORAL | Status: DC | PRN

## 2015-04-20 ENCOUNTER — Ambulatory Visit (INDEPENDENT_AMBULATORY_CARE_PROVIDER_SITE_OTHER): Payer: Medicare Other | Admitting: Family Medicine

## 2015-04-20 ENCOUNTER — Encounter: Payer: Self-pay | Admitting: Family Medicine

## 2015-04-20 VITALS — BP 122/70 | HR 84 | Temp 97.8°F | Ht 66.5 in | Wt 150.5 lb

## 2015-04-20 DIAGNOSIS — I4821 Permanent atrial fibrillation: Secondary | ICD-10-CM

## 2015-04-20 DIAGNOSIS — I482 Chronic atrial fibrillation: Secondary | ICD-10-CM | POA: Diagnosis not present

## 2015-04-20 DIAGNOSIS — I5032 Chronic diastolic (congestive) heart failure: Secondary | ICD-10-CM

## 2015-04-20 DIAGNOSIS — N1831 Chronic kidney disease, stage 3a: Secondary | ICD-10-CM | POA: Insufficient documentation

## 2015-04-20 DIAGNOSIS — N183 Chronic kidney disease, stage 3 unspecified: Secondary | ICD-10-CM | POA: Insufficient documentation

## 2015-04-20 DIAGNOSIS — E1122 Type 2 diabetes mellitus with diabetic chronic kidney disease: Secondary | ICD-10-CM

## 2015-04-20 DIAGNOSIS — R0600 Dyspnea, unspecified: Secondary | ICD-10-CM

## 2015-04-20 DIAGNOSIS — H8109 Meniere's disease, unspecified ear: Secondary | ICD-10-CM | POA: Insufficient documentation

## 2015-04-20 DIAGNOSIS — E785 Hyperlipidemia, unspecified: Secondary | ICD-10-CM

## 2015-04-20 DIAGNOSIS — I340 Nonrheumatic mitral (valve) insufficiency: Secondary | ICD-10-CM | POA: Insufficient documentation

## 2015-04-20 DIAGNOSIS — I442 Atrioventricular block, complete: Secondary | ICD-10-CM

## 2015-04-20 NOTE — Assessment & Plan Note (Signed)
Check FLP next fasting labs. 

## 2015-04-20 NOTE — Progress Notes (Signed)
BP 122/70 mmHg  Pulse 84  Temp(Src) 97.8 F (36.6 C) (Oral)  Ht 5' 6.5" (1.689 m)  Wt 150 lb 8 oz (68.266 kg)  BMI 23.93 kg/m2   CC: new pt to establish care  Subjective:    Patient ID: Sierra Kaiser, female    DOB: October 10, 1933, 82 y.o.   MRN: [id number]  HPI: Sierra Kaiser is a 82 y.o. female presenting on 04/20/2015 for Establish Care   Prior PCP in [place]. Also saw [place] doctor.   H/o permanent afib s/p AV ablation last year and complete heart block s/p pacemaker. Known moderate mitral regurgitation and diastolic CHF. On rivaroxaban daily. Stays short winded worse with any exertion, some days does well, some days struggles. Worried about MR. Last echo 05/2014 - mild AR, mod MR, mod dilated LA, mildly increased systolic pulm artery pressures. Takes lasix 40mg  daily.   Mild chronic renal insufficiency.  Lab Results  Component Value Date   CREATININE 1.23* 10/27/2014    Intermittent skin itching. GERD (clear phlegm in throat) - controlled with omeprazole 20mg  daily.  Preventative: Due for CPE.   Relevant past medical, surgical, family and social history reviewed and updated as indicated. Interim medical history since our last visit reviewed. Allergies and medications reviewed and updated. Current Outpatient Prescriptions on File Prior to Visit  Medication Sig  . diphenhydrAMINE-zinc acetate (BENADRYL) cream Apply 1 application topically 2 (two) times daily as needed for itching.  . furosemide (LASIX) 40 MG tablet Take 1 tablet (40 mg total) by mouth daily as needed.  Marland Kitchen omeprazole (PRILOSEC) 20 MG capsule Take 20 mg by mouth as needed.   [name] Glycol-Propyl Glycol (SYSTANE OP) Place 1 drop into both eyes daily as needed (dry eyes).  . Rivaroxaban (XARELTO) 15 MG TABS tablet Take 1 tablet (15 mg total) by mouth daily with supper.   No current facility-administered medications on file prior to visit.    Review of Systems Per HPI unless specifically indicated in ROS  section     Objective:    BP 122/70 mmHg  Pulse 84  Temp(Src) 97.8 F (36.6 C) (Oral)  Ht 5' 6.5" (1.689 m)  Wt 150 lb 8 oz (68.266 kg)  BMI 23.93 kg/m2  Wt Readings from Last 3 Encounters:  04/20/15 150 lb 8 oz (68.266 kg)  11/10/14 148 lb 4 oz (67.246 kg)  10/27/14 150 lb 8 oz (68.266 kg)    Physical Exam  Constitutional: She appears well-developed and well-nourished. No distress.  HENT:  Head: Normocephalic and atraumatic.  Mouth/Throat: Oropharynx is clear and moist. No oropharyngeal exudate.  Eyes: Conjunctivae and EOM are normal. Pupils are equal, round, and reactive to light.  Neck: Normal range of motion. Neck supple. No thyromegaly present.  Cardiovascular: Normal rate, regular rhythm, normal heart sounds and intact distal pulses.   No murmur heard. Pulmonary/Chest: Effort normal and breath sounds normal. No respiratory distress. She has no wheezes. She has no rales.  Musculoskeletal: She exhibits no edema.  Lymphadenopathy:    She has no cervical adenopathy.  Skin: Skin is warm and dry. No rash noted.  Psychiatric: She has a normal mood and affect.  Nursing note and vitals reviewed.      Assessment & Plan:   Problem List Items Addressed This Visit    Mitral regurgitation    Did not appreciate murmur on exam today. Continue to monitor.      Meniere's disease   Hyperlipidemia    Check FLP next  fasting labs      Dyspnea    Longstanding issue of unclear etiology. Exam WNL today. Previously thought multifactorial. Reviewed stable CXR from 04/2014. Consider pulm eval if persists.       Complete heart block (HCC)    S/p pacer. Followed by EP      CKD stage 3 secondary to diabetes ([place])    Check Cr next labs.      Chronic diastolic CHF (congestive heart failure) (HCC)    Seems euvolemic today. Continue lasix 40mg  daily.      Atrial fibrillation, permanent ([place]) - Primary    Great control since ablation. Continue xarelto. Sounds regular today.            Follow up plan: Return in about 6 months (around 10/18/2015), or as needed, for medicare wellness visit.

## 2015-04-20 NOTE — Patient Instructions (Addendum)
Schedule physical at your convenience over next 4-6 months with fasting labs. I will review records when they arrive.  Good to see you today, call us with questions. Return as needed or for medicare wellness visit.

## 2015-04-20 NOTE — Assessment & Plan Note (Signed)
S/p pacer. Followed by EP

## 2015-04-20 NOTE — Assessment & Plan Note (Signed)
Longstanding issue of unclear etiology. Exam WNL today. Previously thought multifactorial. Reviewed stable CXR from 04/2014. Consider pulm eval if persists.

## 2015-04-20 NOTE — Progress Notes (Signed)
Pre visit review using our clinic review tool, if applicable. No additional management support is needed unless otherwise documented below in the visit note. 

## 2015-04-20 NOTE — Assessment & Plan Note (Signed)
Great control since ablation. Continue xarelto. Sounds regular today.

## 2015-04-20 NOTE — Assessment & Plan Note (Addendum)
Check Cr next labs.

## 2015-04-20 NOTE — Assessment & Plan Note (Signed)
Did not appreciate murmur on exam today. Continue to monitor.

## 2015-04-20 NOTE — Assessment & Plan Note (Signed)
Seems euvolemic today. Continue lasix 40mg  daily.

## 2015-04-23 ENCOUNTER — Telehealth: Payer: Self-pay | Admitting: Cardiovascular Disease

## 2015-04-23 NOTE — Telephone Encounter (Signed)
Dr. Rockey Situ is her "plumber" while Dr. [name] is her "electrician". At her last ov w/ Dr. Rockey Situ in August, he advised to see her again in 6 months.

## 2015-04-23 NOTE — Telephone Encounter (Signed)
Attempted to call to discuss this but patient did not answer.  Will attempt at a later date to schedule fu.

## 2015-04-23 NOTE — Telephone Encounter (Signed)
Patient wants to know if they need Gollan fu bc she is already seeing klein.

## 2015-05-13 ENCOUNTER — Telehealth: Payer: Self-pay | Admitting: Cardiovascular Disease

## 2015-05-13 NOTE — Telephone Encounter (Signed)
Pt takes Xarelto 15 mg once daily.  She would like to know if she needs to hole prior to crown removal. We have not received correspondence from dentist.

## 2015-05-13 NOTE — Telephone Encounter (Signed)
Should be fine to stay on the anticoagulation Would only stop the xarelto if she is having tooth extracted

## 2015-05-13 NOTE — Telephone Encounter (Signed)
Spoke w/ pt.  Advised her of Dr. Donivan Scull recommendation. She verbalizes understanding and will make sure that her dentist is agreeable.

## 2015-05-13 NOTE — Telephone Encounter (Signed)
Pt states she is going to the dentist next week to have a crown removed. Asks if she should stop her blood thinner. Please advise.

## 2015-05-15 ENCOUNTER — Encounter: Payer: Self-pay | Admitting: Family Medicine

## 2015-05-16 ENCOUNTER — Encounter: Payer: Self-pay | Admitting: Family Medicine

## 2015-05-25 ENCOUNTER — Other Ambulatory Visit
Admission: RE | Admit: 2015-05-25 | Discharge: 2015-05-25 | Disposition: A | Discharge status: Home / Self Care | Payer: Medicare Other | Source: Ambulatory Visit | Attending: Internal Medicine | Admitting: Internal Medicine

## 2015-05-25 ENCOUNTER — Ambulatory Visit (INDEPENDENT_AMBULATORY_CARE_PROVIDER_SITE_OTHER): Payer: Medicare Other | Admitting: Internal Medicine

## 2015-05-25 ENCOUNTER — Encounter: Payer: Self-pay | Admitting: Internal Medicine

## 2015-05-25 VITALS — BP 100/62 | HR 70 | Ht 68.0 in | Wt 150.5 lb

## 2015-05-25 DIAGNOSIS — R232 Flushing: Secondary | ICD-10-CM

## 2015-05-25 DIAGNOSIS — R6883 Chills (without fever): Secondary | ICD-10-CM | POA: Diagnosis not present

## 2015-05-25 DIAGNOSIS — N951 Menopausal and female climacteric states: Secondary | ICD-10-CM | POA: Diagnosis not present

## 2015-05-25 DIAGNOSIS — I340 Nonrheumatic mitral (valve) insufficiency: Secondary | ICD-10-CM | POA: Diagnosis not present

## 2015-05-25 DIAGNOSIS — I4821 Permanent atrial fibrillation: Secondary | ICD-10-CM

## 2015-05-25 DIAGNOSIS — I482 Chronic atrial fibrillation: Secondary | ICD-10-CM | POA: Diagnosis not present

## 2015-05-25 LAB — CBC WITH DIFFERENTIAL/PLATELET
Basophils Absolute: 0.1 10*3/uL (ref 0–0.1)
Basophils Relative: 2 %
Eosinophils Absolute: 0.1 10*3/uL (ref 0–0.7)
Eosinophils Relative: 2 %
HCT: 36.5 % (ref 35.0–47.0)
Hemoglobin: 11.7 g/dL — ABNORMAL LOW (ref 12.0–16.0)
Lymphocytes Relative: 23 %
Lymphs Abs: 1.2 10*3/uL (ref 1.0–3.6)
MCH: 25.4 pg — ABNORMAL LOW (ref 26.0–34.0)
MCHC: 32.1 g/dL (ref 32.0–36.0)
MCV: 79.1 fL — ABNORMAL LOW (ref 80.0–100.0)
Monocytes Absolute: 0.5 10*3/uL (ref 0.2–0.9)
Monocytes Relative: 9 %
Neutro Abs: 3.3 10*3/uL (ref 1.4–6.5)
Neutrophils Relative %: 64 %
Platelets: 225 10*3/uL (ref 150–440)
RBC: 4.61 MIL/uL (ref 3.80–5.20)
RDW: 17.1 % — ABNORMAL HIGH (ref 11.5–14.5)
WBC: 5.2 10*3/uL (ref 3.6–11.0)

## 2015-05-25 LAB — BASIC METABOLIC PANEL
Anion gap: 7 (ref 5–15)
BUN: 28 mg/dL — ABNORMAL HIGH (ref 6–20)
CO2: 23 mmol/L (ref 22–32)
Calcium: 9.4 mg/dL (ref 8.9–10.3)
Chloride: 106 mmol/L (ref 101–111)
Creatinine, Ser: 1.39 mg/dL — ABNORMAL HIGH (ref 0.44–1.00)
GFR calc Af Amer: 40 mL/min — ABNORMAL LOW (ref >60)
GFR calc non Af Amer: 35 mL/min — ABNORMAL LOW (ref >60)
Glucose, Bld: 89 mg/dL (ref 65–99)
Potassium: 4 mmol/L (ref 3.5–5.1)
Sodium: 136 mmol/L (ref 135–145)

## 2015-05-25 NOTE — Progress Notes (Signed)
Patient Care Team: [name], MD as PCP - General (Family Medicine)   HPI  Sierra Kaiser is a 82 y.o. female Seen in follow-up for AV ablation for permanent atrial fibrillation with poorly controlled ventricular response. Initially she was very short of breath and the device was reprogrammed  There had been gradual improvement  Echo 3/16 demonstrated normal LV function  with moderate MR.  Echocardiogram 8/15 described Mild-moderate MR  She had TEE at [facility] from 10/14. This report was read no description of eccentric MR was noted.  She has episodic shorthenss of breath not necessarily related to exertion.  She is also having episodic hot flashes. There has been no erythema or tenderness at her device site.  She is tolerating her Rivaroxaban without questions are without bleeding  Past Medical History  Diagnosis Date  . Permanent atrial fibrillation ([place])     a. permanent b. s/p PVI RFA at [facility] 10/14 c. failed amio (neuro toxicity) and Tikosyn d. 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 Hyperlipidemia   . Raynaud's disease   . Meniere's disease   . History of diverticulitis   . GI bleeding 2012    during EGD necessitating open surgery  . Anxiety   . Urinary incontinence, urge   . CKD stage 3 secondary to diabetes ([place])   . SNHL (sensorineural hearing loss)     right ear  . Tinnitus   . CAD (coronary artery disease)   . Mitral regurgitation   . HTN (hypertension)   . Benign colon polyp   . History of UTI   . History of blood transfusion   . Syncopal episodes   . Osteoporosis   . PUD (peptic ulcer disease)   . Ulcerative colitis ([place])     per prior records  . Hiatal hernia   . Esophageal reflux     Past Surgical History  Procedure Laterality Date  . Cardiac electrophysiology study and ablation    . Transesophageal echocardiogram with cardioversion  2014    [facility]  . Cholecystectomy  2010  . Vaginal hysterectomy   1973    elective; ovaries remained  . Hemorrhoid surgery    . Upper gi endoscopy  2012    with polypectomy  . Appendectomy  1973  . Cardiac catheterization  2014    [facility]  . Permanent pacemaker insertion N/A 04/22/2014    STJ single chamber pacemaker implanted by Dr [name]  . Av node ablation N/A 05/06/2014    Procedure: AV NODE ABLATION;  Surgeon: [name], MD;  Location: [facility] CATH LAB;  Service: Cardiovascular;  Laterality: N/A;  . Laparotomy  2012    EGD biopsy led to bleeding - needed laparotomy to stop bleed (Dr Jimmye Norman at [facility])  . Colonoscopy  2010    polyps    Current Outpatient Prescriptions  Medication Sig Dispense Refill  . Biotin 5000 MCG TABS Take 5,000 mcg by mouth daily.    . diphenhydrAMINE-zinc acetate (BENADRYL) cream Apply 1 application topically 2 (two) times daily as needed for itching.    . furosemide (LASIX) 40 MG tablet Take 1 tablet (40 mg total) by mouth daily as needed. 90 tablet 2  . omeprazole (PRILOSEC) 20 MG capsule Take 20 mg by mouth as needed.     [name] Glycol-Propyl Glycol (SYSTANE OP) Place 1 drop into both eyes daily as needed (dry eyes).    . Rivaroxaban (XARELTO)  15 MG TABS tablet Take 1 tablet (15 mg total) by mouth daily with supper. 90 tablet 3   No current facility-administered medications for this visit.    Allergies  Allergen Reactions  . Contrast Media [Iodinated Diagnostic Agents] Hives       . Metronidazole Itching and Other (See Comments)    Hands really red  . Diltiazem Hcl Itching and Rash    Review of Systems negative except from HPI and PMH  Physical Exam BP 100/62 mmHg  Pulse 70  Ht 5\' 8"  (1.727 m)  Wt 150 lb 8 oz (68.266 kg)  BMI 22.89 kg/m2 Well developed and well nourished in no acute distress HENT normal E scleral and icterus clear Neck Supple JVP flat; carotids brisk and full Clear to ausculation  Regular rate and rhythm, no murmurs gallops or rub Soft with active bowel sounds No  clubbing cyanosis  Edema Alert and oriented, grossly normal motor and sensory function Skin Warm and Dry   ECG atrial fibrillation with ventricular pacing  Assessment and  Plan  Atrial fibrillation  Complete heart block status post AV ablation  HFpEF   Normal LV function   MR mod  Anticoagulation   Possible devcie infection   Will continue every other day diuretics for HFpEF.   We will reassess her MR by echo  With exertional tachypalpitations and exercise intolerance I have decreased her slope from 10--8 intermaxillary sensory from 120--110. 6.7% of her beats were faster than 100 bpm  I'm concerned about her "hot flashes". There is no evidence of pocket infection but will obtain 2 blood cultures  Her last creatinine was 1.23. We will reassess to make sure her Rivaroxaban Dose is appropriate; there has been interval decrease in her hemoglobin last year from 12.9-to-10.4 we will reassess it.   No labs in careeverywhere

## 2015-05-25 NOTE — Patient Instructions (Signed)
Medication Instructions: - Your physician recommends that you continue on your current medications as directed. Please refer to the Current Medication list given to you today.  Labwork: - Your physician recommends that you have lab work: 1 blood culture drawn today and 1 blood culture drawn tomorrow.  Procedures/Testing: - Your physician has requested that you have an echocardiogram. Echocardiography is a painless test that uses sound waves to create images of your heart. It provides your doctor with information about the size and shape of your heart and how well your heart's chambers and valves are working. This procedure takes approximately one hour. There are no restrictions for this procedure.  Follow-Up: - Remote monitoring is used to monitor your Pacemaker of ICD from home. This monitoring reduces the number of office visits required to check your device to one time per year. It allows [place] to keep an eye on the functioning of your device to ensure it is working properly. You are scheduled for a device check from home on 08/24/15. You may send your transmission at any time that day. If you have a wireless device, the transmission will be sent automatically. After your physician reviews your transmission, you will receive a postcard with your next transmission date.  - Your physician wants you to follow-up in: 1 year with Dr. [name]. You will receive a reminder letter in the mail two months in advance. If you don't receive a letter, please call our office to schedule the follow-up appointment.  Any Additional Special Instructions Will Be Listed Below (If Applicable).     If you need a refill on your cardiac medications before your next appointment, please call your pharmacy.

## 2015-05-26 ENCOUNTER — Other Ambulatory Visit
Admission: RE | Admit: 2015-05-26 | Discharge: 2015-05-26 | Disposition: A | Discharge status: Home / Self Care | Payer: Medicare Other | Source: Ambulatory Visit | Attending: Internal Medicine | Admitting: Internal Medicine

## 2015-05-26 ENCOUNTER — Other Ambulatory Visit: Payer: Self-pay | Admitting: Family Medicine

## 2015-05-26 ENCOUNTER — Encounter: Payer: Self-pay | Admitting: Family Medicine

## 2015-05-26 DIAGNOSIS — D509 Iron deficiency anemia, unspecified: Secondary | ICD-10-CM

## 2015-05-26 DIAGNOSIS — R6883 Chills (without fever): Secondary | ICD-10-CM | POA: Diagnosis present

## 2015-05-26 DIAGNOSIS — E611 Iron deficiency: Secondary | ICD-10-CM | POA: Insufficient documentation

## 2015-05-26 DIAGNOSIS — T462X1D Poisoning by other antidysrhythmic drugs, accidental (unintentional), subsequent encounter: Secondary | ICD-10-CM

## 2015-05-27 ENCOUNTER — Telehealth: Payer: Self-pay | Admitting: Internal Medicine

## 2015-05-27 ENCOUNTER — Other Ambulatory Visit: Payer: Medicare Other

## 2015-05-27 NOTE — Telephone Encounter (Signed)
Notes Recorded by Ignacia Marvel, CMA on 05/27/2015 at 9:18 AM Patient notified and lab appt scheduled. She said she got her pacemaker adjusted 2 days ago and hasn't "felt right" since then. I asked if she had called her cardiologist and she said no. I advised her to call them today and see what they advised. She verbalized understanding  I had left a message for the patient earlier today to call back for lab results. The above message had been resulted on her labs when I went back to review. Will discuss changes with Device clinic and call her back.

## 2015-05-27 NOTE — Telephone Encounter (Signed)
The patient is aware of her results. She states she did not feel well the last 2 days since her pacemaker was adjusted.  She is feeling some better today. I advised her I am uncertain of the changes that were made, but will forward a message to the device clinic to see what was done and if they can check on her tomorrow to see if she is still feeling ok. She is agreeable.

## 2015-05-27 NOTE — Telephone Encounter (Signed)
Pt called, states she went to see her PCP, and states he told her her lab results and states she needs more labs. States she is confused. Please call

## 2015-05-28 LAB — CUP PACEART INCLINIC DEVICE CHECK
Date Time Interrogation Session: 20170324143033
Implantable Lead Implant Date: 20160217
Implantable Lead Location: 753860
Implantable Lead Model: 1948
Pulse Gen Model: 2240
Pulse Gen Serial Number: 3050195

## 2015-05-28 NOTE — Telephone Encounter (Signed)
Called patient.  She reports that she has felt "trembly" since last Tuesday when her PPM was adjusted.  She has noted increased SOB with exertion and she reports that she is easily fatigued.  Her vital signs are at baseline.  Patient was unable to have her root canal on Wednesday because the dentist was not comfortable with her symptoms.  Patient states she has not been back to her PCP for additional labs yet.  Advised patient to schedule lab appointment for additional blood work at [facility] office as soon as possible.  Patient states that today she has mostly been resting and she feels okay, but still not at her baseline.  She sent manual Merlin transmission for review.  No abnormalities noted.  Tentatively scheduled patient for appointment with Dr. [name] in [place] on Tuesday, 06/01/15 at 11:00.  Patient is aware that she may end up only needing a device clinic appointment and that she may not need to see Dr. [name].  Advised that I will review her symptoms and her remote transmission with Dr. [name] on Monday and call her if we need to make any changes to this plan.  Advised patient to seek medical attention over the weekend if her symptoms worsen.  Patient verbalizes understanding of all instructions and denies additional questions or concerns at this time.

## 2015-05-31 ENCOUNTER — Other Ambulatory Visit (INDEPENDENT_AMBULATORY_CARE_PROVIDER_SITE_OTHER): Payer: Medicare Other

## 2015-05-31 DIAGNOSIS — I4821 Permanent atrial fibrillation: Secondary | ICD-10-CM

## 2015-05-31 DIAGNOSIS — T462X1D Poisoning by other antidysrhythmic drugs, accidental (unintentional), subsequent encounter: Secondary | ICD-10-CM | POA: Diagnosis not present

## 2015-05-31 DIAGNOSIS — D509 Iron deficiency anemia, unspecified: Secondary | ICD-10-CM | POA: Diagnosis not present

## 2015-05-31 DIAGNOSIS — I482 Chronic atrial fibrillation: Secondary | ICD-10-CM

## 2015-05-31 LAB — LACTATE DEHYDROGENASE: LDH: 152 U/L (ref 94–250)

## 2015-05-31 LAB — IBC PANEL
Iron: 45 ug/dL (ref 42–145)
Saturation Ratios: 9.1 % — ABNORMAL LOW (ref 20.0–50.0)
Transferrin: 354 mg/dL (ref 212.0–360.0)

## 2015-05-31 LAB — FERRITIN: Ferritin: 6.7 ng/mL — ABNORMAL LOW (ref 10.0–291.0)

## 2015-05-31 LAB — T3: T3, Total: 85 ng/dL (ref 76–181)

## 2015-05-31 LAB — CULTURE, BLOOD (SINGLE): Culture: NO GROWTH

## 2015-05-31 LAB — T4, FREE: Free T4: 0.81 ng/dL (ref 0.60–1.60)

## 2015-05-31 LAB — TSH: TSH: 4.09 u[IU]/mL (ref 0.35–4.50)

## 2015-05-31 NOTE — Addendum Note (Signed)
Addended by: Daralene Milch C on: 05/31/2015 11:27 AM   Modules accepted: Orders

## 2015-06-01 ENCOUNTER — Encounter: Payer: Self-pay | Admitting: Internal Medicine

## 2015-06-01 ENCOUNTER — Ambulatory Visit (INDEPENDENT_AMBULATORY_CARE_PROVIDER_SITE_OTHER): Payer: Medicare Other | Admitting: *Deleted

## 2015-06-01 ENCOUNTER — Encounter: Payer: Medicare Other | Admitting: Internal Medicine

## 2015-06-01 DIAGNOSIS — I442 Atrioventricular block, complete: Secondary | ICD-10-CM

## 2015-06-01 LAB — CUP PACEART INCLINIC DEVICE CHECK
Battery Remaining Longevity: 133.2
Battery Voltage: 3.01 V
Brady Statistic RA Percent Paced: 0 %
Brady Statistic RV Percent Paced: 99.89 %
Date Time Interrogation Session: 20170328133029
Implantable Lead Implant Date: 20160217
Implantable Lead Location: 753860
Implantable Lead Model: 1948
Lead Channel Impedance Value: 612.5 Ohm
Lead Channel Pacing Threshold Amplitude: 0.75 V
Lead Channel Pacing Threshold Pulse Width: 0.4 ms
Lead Channel Sensing Intrinsic Amplitude: 12 mV
Lead Channel Setting Pacing Amplitude: 1 V
Lead Channel Setting Pacing Pulse Width: 0.4 ms
Lead Channel Setting Sensing Sensitivity: 2.5 mV
Pulse Gen Model: 2240
Pulse Gen Serial Number: 3050195

## 2015-06-01 NOTE — Progress Notes (Signed)
Device check in clinic for possible reprogramming per SK (see phone note from 05/27/15). Interrogation only, no reprogramming/changes made. See scanned report for details. Patient reports feeling better since max sensor rate and slope adjusted at last visit. Merlin on 08/24/15 and ROV with SK/B in 12 months.

## 2015-06-02 ENCOUNTER — Other Ambulatory Visit: Payer: Self-pay | Admitting: Family Medicine

## 2015-06-02 MED ORDER — FERROUS SULFATE 325 (65 FE) MG PO TABS: 325.0000 mg | ORAL_TABLET | Freq: Every day | ORAL | Status: DC

## 2015-06-08 ENCOUNTER — Ambulatory Visit (INDEPENDENT_AMBULATORY_CARE_PROVIDER_SITE_OTHER): Payer: Medicare Other

## 2015-06-08 ENCOUNTER — Other Ambulatory Visit: Payer: Self-pay

## 2015-06-08 DIAGNOSIS — I340 Nonrheumatic mitral (valve) insufficiency: Secondary | ICD-10-CM

## 2015-06-14 ENCOUNTER — Other Ambulatory Visit: Payer: Medicare Other

## 2015-06-14 DIAGNOSIS — D509 Iron deficiency anemia, unspecified: Secondary | ICD-10-CM

## 2015-06-14 LAB — FECAL OCCULT BLOOD, GUAIAC: Fecal Occult Blood: NEGATIVE

## 2015-06-14 LAB — FECAL OCCULT BLOOD, IMMUNOCHEMICAL: Fecal Occult Bld: NEGATIVE

## 2015-06-15 ENCOUNTER — Encounter: Payer: Self-pay | Admitting: *Deleted

## 2015-06-17 LAB — CULTURE, BLOOD (SINGLE): Culture: NO GROWTH

## 2015-08-24 ENCOUNTER — Ambulatory Visit (INDEPENDENT_AMBULATORY_CARE_PROVIDER_SITE_OTHER): Payer: Medicare Other | Admitting: *Deleted

## 2015-08-24 DIAGNOSIS — I442 Atrioventricular block, complete: Secondary | ICD-10-CM

## 2015-08-24 NOTE — Progress Notes (Signed)
Remote pacemaker transmission.   

## 2015-08-25 LAB — CUP PACEART REMOTE DEVICE CHECK
Battery Remaining Longevity: 139 mo
Battery Remaining Percentage: 95.5 %
Battery Voltage: 3.01 V
Brady Statistic RV Percent Paced: 99 %
Date Time Interrogation Session: 20170620060023
Implantable Lead Implant Date: 20160217
Implantable Lead Location: 753860
Implantable Lead Model: 1948
Lead Channel Impedance Value: 630 Ohm
Lead Channel Pacing Threshold Amplitude: 0.75 V
Lead Channel Pacing Threshold Pulse Width: 0.4 ms
Lead Channel Setting Pacing Amplitude: 1 V
Lead Channel Setting Pacing Pulse Width: 0.4 ms
Lead Channel Setting Sensing Sensitivity: 2.5 mV
Pulse Gen Model: 2240
Pulse Gen Serial Number: 3050195

## 2015-08-27 ENCOUNTER — Encounter: Payer: Self-pay | Admitting: Cardiology

## 2015-09-14 ENCOUNTER — Telehealth: Payer: Self-pay | Admitting: Cardiovascular Disease

## 2015-09-14 NOTE — Telephone Encounter (Signed)
3 attempts to schedule fu from recall list.  LMOV to schedule with Dr. Rockey Situ.  Deleting recall.

## 2015-09-29 ENCOUNTER — Telehealth: Payer: Self-pay | Admitting: Family Medicine

## 2015-09-29 ENCOUNTER — Ambulatory Visit
Admission: RE | Admit: 2015-09-29 | Discharge: 2015-09-29 | Disposition: A | Discharge status: Home / Self Care | Payer: Medicare Other | Source: Ambulatory Visit | Attending: Family Medicine | Admitting: Family Medicine

## 2015-09-29 ENCOUNTER — Ambulatory Visit (INDEPENDENT_AMBULATORY_CARE_PROVIDER_SITE_OTHER): Payer: Medicare Other | Admitting: Family Medicine

## 2015-09-29 VITALS — BP 126/60 | HR 101 | Temp 98.2°F | Wt 145.1 lb

## 2015-09-29 DIAGNOSIS — K5792 Diverticulitis of intestine, part unspecified, without perforation or abscess without bleeding: Secondary | ICD-10-CM

## 2015-09-29 DIAGNOSIS — R1032 Left lower quadrant pain: Secondary | ICD-10-CM | POA: Insufficient documentation

## 2015-09-29 DIAGNOSIS — R109 Unspecified abdominal pain: Secondary | ICD-10-CM | POA: Diagnosis not present

## 2015-09-29 LAB — POCT URINALYSIS DIPSTICK
Glucose, UA: NEGATIVE
Ketones, UA: 15
Nitrite, UA: NEGATIVE
Protein, UA: 100
Spec Grav, UA: 1.02
Urobilinogen, UA: 1
pH, UA: 5.5

## 2015-09-29 MED ORDER — AMOXICILLIN-POT CLAVULANATE 875-125 MG PO TABS: 1.0000 | ORAL_TABLET | Freq: Two times a day (BID) | ORAL | 0 refills | Status: DC

## 2015-09-29 NOTE — Assessment & Plan Note (Signed)
New problem. Patient presents with Left sided abdominal pain/flank pain. It was difficult to discern etiology based on history and exam. Labs obtained today as well as CT scan. CT revealed findings consistent with acute diverticulitis of the descending colon.  Patient started on Augmentin.

## 2015-09-29 NOTE — Telephone Encounter (Signed)
Pt has appt 09/29/15 at 4:15 with Dr Lacinda Axon.

## 2015-09-29 NOTE — Progress Notes (Signed)
Pre visit review using our clinic review tool, if applicable. No additional management support is needed unless otherwise documented below in the visit note. 

## 2015-09-29 NOTE — Progress Notes (Signed)
Subjective:  Patient ID: Sierra Kaiser, female    DOB: December 17, 1933  Age: 82 y.o. MRN: [id number]  CC: L flank pain  HPI:  82 year old female with CKD III, CHF, A fib, HLD presents with the above complaint.  Patient states yesterday she developed left flank/left sided abdominal pain. Patient states that the pain is sharp and has persisted since yesterday. 8/10 in severity. Improves with lying down. No associated nausea, vomiting, changes in BM's. She states that she has not had fever but had "sweats" and chills last night. Also, patient notes that she got up several times last night to urinate. No other complaints at this time.  Social Hx   Social History   Social History  . Marital status: Married    Spouse name: N/A  . Number of children: N/A  . Years of education: N/A   Social History Main Topics  . Smoking status: Never Smoker  . Smokeless tobacco: Never Used  . Alcohol use 1.2 oz/week    2 Glasses of wine per week     Comment: infrequent mixed drinks  . Drug use: No  . Sexual activity: No   Other Topics Concern  . Not on file   Social History Narrative   Lives with husband.    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  Gastrointestinal: Positive for abdominal pain.  Genitourinary: Positive for flank pain and frequency.   Objective:  BP 126/60 (BP Location: Left Arm, Patient Position: Sitting, Cuff Size: Normal)   Pulse (!) 101   Temp 98.2 F (36.8 C) (Oral)   Wt 145 lb 2 oz (65.8 kg)   SpO2 95%   BMI 22.07 kg/m   BP/Weight 09/29/2015 05/25/2015 7/90/2409  Systolic BP 735 329 924  Diastolic BP 60 62 70  Wt. (Lbs) 145.13 150.5 150.5  BMI 22.07 22.89 23.93   Physical Exam  Constitutional: She is oriented to person, place, and time. She appears well-developed. No distress.  Eyes: Conjunctivae are normal. No scleral icterus.  Pulmonary/Chest:  Effort normal.  Abdominal: Soft.  Tenderness to palpation in the left periumbilical region and LUQ. Questionable L CVA tenderness.  Neurological: She is alert and oriented to person, place, and time.  Psychiatric: She has a normal mood and affect.  Vitals reviewed.  Lab Results  Component Value Date   WBC 5.2 05/25/2015   HGB 11.7 (L) 05/25/2015   HCT 36.5 05/25/2015   PLT 225 05/25/2015   GLUCOSE 89 05/25/2015   CHOL 293 (A) 03/26/2014   TRIG 210 (A) 03/26/2014   HDL 45 03/26/2014   LDLCALC 206 03/26/2014   ALT 11 03/26/2014   AST 14 03/26/2014   NA 136 05/25/2015   K 4.0 05/25/2015   CL 106 05/25/2015   CREATININE 1.39 (H) 05/25/2015   BUN 28 (H) 05/25/2015   CO2 23 05/25/2015   TSH 4.09 05/31/2015   INR 1.3 (H) 04/16/2014   Ct Abdomen Pelvis Wo Contrast  Result Date: 09/29/2015 CLINICAL DATA:  Left upper quadrant/left flank abdominal pain, history of diverticulitis and colitis, prior cholecystectomy, appendectomy, and hysterectomy EXAM: CT ABDOMEN AND PELVIS WITHOUT CONTRAST TECHNIQUE: Multidetector CT imaging of the abdomen and pelvis was performed following the standard protocol without IV contrast. COMPARISON:  None. FINDINGS: Motion degraded images. Lower chest:  Lung bases are clear. Hepatobiliary: Scattered hepatic cysts measuring up to 1.7  cm in segment 6 (series 2/ image 23). Status post cholecystectomy. No intrahepatic or extrahepatic ductal dilatation. Pancreas: Within normal limits. Spleen: Within normal limits. Adrenals/Urinary Tract: Adrenal glands are within normal limits. Kidneys are within normal limits. No renal, ureteral, or bladder calculi. No hydronephrosis. Bladder is underdistended appear Stomach/Bowel: Stomach is within normal limits. No evidence of bowel obstruction. Prior appendectomy. Diverticulitis involving the mid descending colon in the left mid abdomen (series 2/ image 15). Associated pericolonic wall thickening/ inflammatory changes. No drainable  fluid collection/ abscess.  No free air. Vascular/Lymphatic: Atherosclerotic calcifications of the abdominal aorta and branch vessels. No evidence of abdominal aortic aneurysm. No suspicious abdominopelvic lymphadenopathy. Reproductive: Status post hysterectomy. Left ovary is within normal limits.  No right adnexal mass. Other: Small volume pelvic ascites. Musculoskeletal: Visualized osseous structures are within normal limits. IMPRESSION: Diverticulitis involving the mid descending colon in the left mid abdomen. No drainable fluid collection/ abscess.  No free air. These results will be called to the ordering clinician or representative by the Radiology Department at the imaging location. Electronically Signed   By: Julian Hy M.D.   On: 09/29/2015 18:06  Assessment & Plan:   Problem List Items Addressed This Visit    Acute diverticulitis - Primary    New problem. Patient presents with Left sided abdominal pain/flank pain. It was difficult to discern etiology based on history and exam. Labs obtained today as well as CT scan. CT revealed findings consistent with acute diverticulitis of the descending colon.  Patient started on Augmentin.      Relevant Orders   POCT Urinalysis Dipstick (Completed)   CBC   Comp Met (CMET)   Sed Rate (ESR)   Urine Culture   CT Abdomen Pelvis Wo Contrast (Completed)    Other Visit Diagnoses   None.     Meds ordered this encounter  Medications  . amoxicillin-clavulanate (AUGMENTIN) 875-125 MG tablet    Sig: Take 1 tablet by mouth 2 (two) times daily.    Dispense:  20 tablet    Refill:  0    Follow-up: PRN  [place]

## 2015-09-29 NOTE — Telephone Encounter (Signed)
Patient Name: Larysa Bonner DOB: 01-26-1934 Initial Comment Caller states been having pain on left side Nurse Assessment Nurse: Vallery Sa, RN, Cathy Date/Time (Eastern Time): 09/29/2015 10:32:57 AM Confirm and document reason for call. If symptomatic, describe symptoms. You must click the next button to save text entered. ---[name] states she left flank pain yesterday (rated as a 6 on the 1 to 10 scale). No fever today. No injury in the past 3 days. No blood in her urine. Has the patient traveled out of the country within the last 30 days? ---No Does the patient have any new or worsening symptoms? ---Yes Will a triage be completed? ---Yes Related visit to physician within the last 2 weeks? ---Yes Does the PT have any chronic conditions? (i.e. diabetes, asthma, etc.) ---Yes List chronic conditions. ---Pacemaker, A-fib Is this a behavioral health or substance abuse call? ---No Guidelines Guideline Title Affirmed Question Affirmed Notes Flank Pain MODERATE pain (e.g., interferes with normal activities or awakens from sleep) Final Disposition User See Physician within [place], RN, Tye [place] Comments Scheduled 4:15pm appointment today with Dr. Lacinda Axon at the [facility] office. Referrals REFERRED TO PCP OFFICE Disagree/Comply: Comply

## 2015-09-30 LAB — SEDIMENTATION RATE: Sed Rate: 19 mm/hr (ref 0–30)

## 2015-09-30 LAB — CBC
HCT: 39.4 % (ref 36.0–46.0)
Hemoglobin: 13 g/dL (ref 12.0–15.0)
MCHC: 32.9 g/dL (ref 30.0–36.0)
MCV: 85.8 fl (ref 78.0–100.0)
Platelets: 216 10*3/uL (ref 150.0–400.0)
RBC: 4.59 Mil/uL (ref 3.87–5.11)
RDW: 15.7 % — ABNORMAL HIGH (ref 11.5–15.5)
WBC: 13.6 10*3/uL — ABNORMAL HIGH (ref 4.0–10.5)

## 2015-09-30 LAB — COMPREHENSIVE METABOLIC PANEL
ALT: 13 U/L (ref 0–35)
AST: 23 U/L (ref 0–37)
Albumin: 4.4 g/dL (ref 3.5–5.2)
Alkaline Phosphatase: 81 U/L (ref 39–117)
BUN: 21 mg/dL (ref 6–23)
CO2: 25 mEq/L (ref 19–32)
Calcium: 9.8 mg/dL (ref 8.4–10.5)
Chloride: 104 mEq/L (ref 96–112)
Creatinine, Ser: 1.22 mg/dL — ABNORMAL HIGH (ref 0.40–1.20)
GFR: 45.02 mL/min — ABNORMAL LOW (ref >60.00)
Glucose, Bld: 100 mg/dL — ABNORMAL HIGH (ref 70–99)
Potassium: 4.4 mEq/L (ref 3.5–5.1)
Sodium: 138 mEq/L (ref 135–145)
Total Bilirubin: 0.8 mg/dL (ref 0.2–1.2)
Total Protein: 7.6 g/dL (ref 6.0–8.3)

## 2015-10-01 LAB — URINE CULTURE: Organism ID, Bacteria: 10000

## 2015-10-06 ENCOUNTER — Other Ambulatory Visit: Payer: Self-pay | Admitting: Family Medicine

## 2015-10-06 ENCOUNTER — Telehealth: Payer: Self-pay

## 2015-10-06 DIAGNOSIS — N183 Chronic kidney disease, stage 3 unspecified: Secondary | ICD-10-CM

## 2015-10-06 DIAGNOSIS — E785 Hyperlipidemia, unspecified: Secondary | ICD-10-CM

## 2015-10-06 DIAGNOSIS — D509 Iron deficiency anemia, unspecified: Secondary | ICD-10-CM

## 2015-10-06 NOTE — Telephone Encounter (Signed)
Ok to come in for labs despite antibiotic. Thanks.

## 2015-10-06 NOTE — Telephone Encounter (Signed)
Message left advising patient.  

## 2015-10-06 NOTE — Telephone Encounter (Signed)
Pt left /vm requesting cb; pt is scheduled for [facility] wellness labs on 10/07/15 at 10 AM; pt is presently taking abx for diverticulitis and pt wants to know if should wait for labs when finish abx. Please advise.

## 2015-10-07 ENCOUNTER — Other Ambulatory Visit (INDEPENDENT_AMBULATORY_CARE_PROVIDER_SITE_OTHER): Payer: Medicare Other

## 2015-10-07 ENCOUNTER — Ambulatory Visit (INDEPENDENT_AMBULATORY_CARE_PROVIDER_SITE_OTHER): Payer: Medicare Other

## 2015-10-07 VITALS — BP 98/60 | HR 72 | Temp 97.6°F | Ht 66.0 in | Wt 147.2 lb

## 2015-10-07 DIAGNOSIS — N183 Chronic kidney disease, stage 3 unspecified: Secondary | ICD-10-CM

## 2015-10-07 DIAGNOSIS — D509 Iron deficiency anemia, unspecified: Secondary | ICD-10-CM | POA: Diagnosis not present

## 2015-10-07 DIAGNOSIS — E2839 Other primary ovarian failure: Secondary | ICD-10-CM

## 2015-10-07 DIAGNOSIS — Z1239 Encounter for other screening for malignant neoplasm of breast: Secondary | ICD-10-CM

## 2015-10-07 DIAGNOSIS — E785 Hyperlipidemia, unspecified: Secondary | ICD-10-CM

## 2015-10-07 DIAGNOSIS — Z0000 Encounter for general adult medical examination without abnormal findings: Secondary | ICD-10-CM

## 2015-10-07 LAB — LIPID PANEL
Cholesterol: 203 mg/dL — ABNORMAL HIGH (ref 0–200)
HDL: 42.5 mg/dL (ref >39.00)
LDL Cholesterol: 136 mg/dL — ABNORMAL HIGH (ref 0–99)
NonHDL: 160.93
Total CHOL/HDL Ratio: 5
Triglycerides: 125 mg/dL (ref 0.0–149.0)
VLDL: 25 mg/dL (ref 0.0–40.0)

## 2015-10-07 LAB — CREATININE, SERUM: Creatinine, Ser: 1.11 mg/dL (ref 0.40–1.20)

## 2015-10-07 LAB — IBC PANEL
Iron: 46 ug/dL (ref 42–145)
Saturation Ratios: 11.9 % — ABNORMAL LOW (ref 20.0–50.0)
Transferrin: 277 mg/dL (ref 212.0–360.0)

## 2015-10-07 LAB — FERRITIN: Ferritin: 16.3 ng/mL (ref 10.0–291.0)

## 2015-10-07 NOTE — Progress Notes (Signed)
Pre visit review using our clinic review tool, if applicable. No additional management support is needed unless otherwise documented below in the visit note. 

## 2015-10-07 NOTE — Patient Instructions (Addendum)
Sierra Kaiser , Thank you for taking time to come for your Medicare Wellness Visit. I appreciate your ongoing commitment to your health goals. Please review the following plan we discussed and let me know if I can assist you in the future.   These are the goals we discussed: Goals    After vacation, I plan to see a vein specialist to discuss leaky valve and shortness of breath.        This is a list of the screening recommended for you and due dates:  Health Maintenance  Topic Date Due  . Hemoglobin A1C  03/05/2016  . Complete foot exam   10/18/2015  . Eye exam for diabetics  03/05/2016  . Urine Protein Check  04/[birth year]/1945  . DTaP/Tdap/Td vaccine (1 - Tdap) 10/06/2016  . Tetanus Vaccine  10/06/2016  . Shingles Vaccine  10/06/2016  . DEXA scan (bone density measurement)  03/05/2016  . Flu Shot  03/05/2016  . Pneumonia vaccines  Completed   Preventive Care for Adults  A healthy lifestyle and preventive care can promote health and wellness. Preventive health guidelines for adults include the following key practices.  . A routine yearly physical is a good way to check with your health care provider about your health and preventive screening. It is a chance to share any concerns and updates on your health and to receive a thorough exam.  . Visit your dentist for a routine exam and preventive care every 6 months. Brush your teeth twice a day and floss once a day. Good oral hygiene prevents tooth decay and gum disease.  . The frequency of eye exams is based on your age, health, family medical history, use  of contact lenses, and other factors. Follow your health care provider's ecommendations for frequency of eye exams.  . Eat a healthy diet. Foods like vegetables, fruits, whole grains, low-fat dairy products, and lean protein foods contain the nutrients you need without too many calories. Decrease your intake of foods high in solid fats, added sugars, and salt. Eat the right amount of  calories for you. Get information about a proper diet from your health care provider, if necessary.  . Regular physical exercise is one of the most important things you can do for your health. Most adults should get at least 150 minutes of moderate-intensity exercise (any activity that increases your heart rate and causes you to sweat) each week. In addition, most adults need muscle-strengthening exercises on 2 or more days a week.  Silver Sneakers may be a benefit available to you. To determine eligibility, you may visit the website: www.silversneakers.com or contact program at 507-875-5070 Mon-Fri between 8AM-8PM.   . Maintain a healthy weight. The body mass index (BMI) is a screening tool to identify possible weight problems. It provides an estimate of body fat based on height and weight. Your health care provider can find your BMI and can help you achieve or maintain a healthy weight.   For adults 20 years and older: ? A BMI below 18.5 is considered underweight. ? A BMI of 18.5 to 24.9 is normal. ? A BMI of 25 to 29.9 is considered overweight. ? A BMI of 30 and above is considered obese.   . Maintain normal blood lipids and cholesterol levels by exercising and minimizing your intake of saturated fat. Eat a balanced diet with plenty of fruit and vegetables. Blood tests for lipids and cholesterol should begin at age 36 and be repeated every 5 years. If your  lipid or cholesterol levels are high, you are over 50, or you are at high risk for heart disease, you may need your cholesterol levels checked more frequently. Ongoing high lipid and cholesterol levels should be treated with medicines if diet and exercise are not working.  . If you smoke, find out from your health care provider how to quit. If you do not use tobacco, please do not start.  . If you choose to drink alcohol, please do not consume more than 2 drinks per day. One drink is considered to be 12 ounces (355 mL) of beer, 5 ounces  (148 mL) of wine, or 1.5 ounces (44 mL) of liquor.  . If you are 30-18 years old, ask your health care provider if you should take aspirin to prevent strokes.  . Use sunscreen. Apply sunscreen liberally and repeatedly throughout the day. You should seek shade when your shadow is shorter than you. Protect yourself by wearing long sleeves, pants, a wide-brimmed hat, and sunglasses year round, whenever you are outdoors.  . Once a month, do a whole body skin exam, using a mirror to look at the skin on your back. Tell your health care provider of new moles, moles that have irregular borders, moles that are larger than a pencil eraser, or moles that have changed in shape or color.

## 2015-10-07 NOTE — Progress Notes (Signed)
PCP notes:   Health maintenance:  Flu vaccine - addressed Dexa scan - will schedule Mammogram - will schedule Shingles - postponed Tetanus - postponed   Abnormal screenings:   Hearing - failed  Patient concerns:   A. After vacation, pt wants to see a vein specialist about leaky valve. Pt encouraged to speak with PCP about concerns at CPE.  Nurse concerns:  None  Next PCP appt:   10/18/15 @ 1500

## 2015-10-07 NOTE — Progress Notes (Signed)
Subjective:   Sierra Kaiser is a 82 y.o. female who presents for an Initial Medicare Annual Wellness Visit.  Review of Systems    N/A  Cardiac Risk Factors include: advanced age (>57men, >83 women)     Objective:    Today's Vitals   10/07/15 1010  BP: 98/60  Pulse: 72  Temp: 97.6 F (36.4 C)  TempSrc: Oral  SpO2: 96%  Weight: 147 lb 4 oz (66.8 kg)  Height: 5\' 6"  (1.676 m)  PainSc: 0-No pain   Body mass index is 23.77 kg/m.   Current Medications (verified) Outpatient Encounter Prescriptions as of 10/07/2015  Medication Sig  . amoxicillin-clavulanate (AUGMENTIN) 875-125 MG tablet Take 1 tablet by mouth 2 (two) times daily.  . Biotin 5000 MCG TABS Take 5,000 mcg by mouth daily.  . diphenhydrAMINE-zinc acetate (BENADRYL) cream Apply 1 application topically 2 (two) times daily as needed for itching.  . furosemide (LASIX) 40 MG tablet Take 1 tablet (40 mg total) by mouth daily as needed.  Marland Kitchen omeprazole (PRILOSEC) 20 MG capsule Take 20 mg by mouth as needed.   [name] Glycol-Propyl Glycol (SYSTANE OP) Place 1 drop into both eyes daily as needed (dry eyes).  . Rivaroxaban (XARELTO) 15 MG TABS tablet Take 1 tablet (15 mg total) by mouth daily with supper.  . vitamin B-12 (CYANOCOBALAMIN) 1000 MCG tablet Take 1,000 mcg by mouth daily.  . [DISCONTINUED] ferrous sulfate 325 (65 FE) MG tablet Take 1 tablet (325 mg total) by mouth daily with breakfast.   No facility-administered encounter medications on file as of 10/07/2015.     Allergies (verified) Contrast media [iodinated diagnostic agents]; Metronidazole; and Diltiazem hcl   History: Past Medical History:  Diagnosis Date  . Anxiety   . Benign colon polyp   . CAD (coronary artery disease)   . CKD stage 3 secondary to diabetes ([place])   . Esophageal reflux   . GI bleeding 2012   during EGD necessitating open surgery  . Hiatal hernia   . History of blood transfusion   . History of diverticulitis   . History of UTI     . HTN (hypertension)   . Hyperlipidemia   . Meniere's disease   . Mitral regurgitation   . Osteoporosis   . Permanent atrial fibrillation ([place])    a. permanent b. s/p PVI RFA at [facility] 10/14 c. failed amio (neuro toxicity) and Tikosyn d. 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 PUD (peptic ulcer disease)   . Raynaud's disease   . SNHL (sensorineural hearing loss)    right ear  . Syncopal episodes   . Tinnitus   . Ulcerative colitis ([place])    per prior records  . Urinary incontinence, urge    Past Surgical History:  Procedure Laterality Date  . APPENDECTOMY  1973  . AV NODE ABLATION N/A 05/06/2014   Procedure: AV NODE ABLATION;  Surgeon: [name], MD;  Location: [facility] CATH LAB;  Service: Cardiovascular;  Laterality: N/A;  . CARDIAC CATHETERIZATION  2014   [facility]  . CARDIAC ELECTROPHYSIOLOGY STUDY AND ABLATION    . CHOLECYSTECTOMY  2010  . COLONOSCOPY  2010   polyps  . HEMORRHOID SURGERY    . LAPAROTOMY  2012   EGD biopsy led to bleeding - needed laparotomy to stop bleed (Dr Jimmye Norman at [facility])  . PERMANENT PACEMAKER INSERTION N/A 04/22/2014   STJ single chamber pacemaker implanted by Dr [name]  .  TRANSESOPHAGEAL ECHOCARDIOGRAM WITH CARDIOVERSION  2014   [facility]  . UPPER GI ENDOSCOPY  2012   with polypectomy  . VAGINAL HYSTERECTOMY  1973   elective; ovaries remained   Family History  Problem Relation Age of Onset  . Arrhythmia Mother   . Hypertension Mother   . Diabetes Mother   . Heart failure Brother   . Heart failure Brother   . Cancer Brother     colon (with colostomy)  . Cancer Sister     breast  . Alcohol abuse Brother   . Stroke Brother   . Diabetes Son    Social History   Occupational History  . Not on file.   Social History Main Topics  . Smoking status: Never Smoker  . Smokeless tobacco: Never Used  . Alcohol use 1.2 oz/week    2 Glasses of wine per week     Comment: infrequent mixed drinks  . Drug  use: No  . Sexual activity: No    Tobacco Counseling Counseling given: No   Activities of Daily Living In your present state of health, do you have any difficulty performing the following activities: 10/07/2015  Hearing? Y  Vision? N  Difficulty concentrating or making decisions? Y  Walking or climbing stairs? N  Dressing or bathing? N  Doing errands, shopping? N  Preparing Food and eating ? N  Using the Toilet? N  In the past six months, have you accidently leaked urine? Y  Do you have problems with loss of bowel control? N  Managing your Medications? N  Managing your Finances? N  Housekeeping or managing your Housekeeping? N  Some recent data might be hidden    Immunizations and Health Maintenance Immunization History  Administered Date(s) Administered  . Influenza-Unspecified 12/28/2014  . Pneumococcal Conjugate-13 04/03/2014  . Pneumococcal Polysaccharide-23 03/16/2010   There are no preventive care reminders to display for this patient.  Patient Care Team: [name], MD as PCP - General (Family Medicine)     Assessment:   This is a routine wellness examination for Sierra Kaiser.   Hearing/Vision screen  Hearing Screening   125Hz  250Hz  500Hz  1000Hz  2000Hz  3000Hz  4000Hz  6000Hz  8000Hz   Right ear:   0 0 0  0    Left ear:   40 40 40  0      Visual Acuity Screening   Right eye Left eye Both eyes  Without correction: 20/50 20/40 20/30   With correction:       Dietary issues and exercise activities discussed: Current Exercise Habits: The patient does not participate in regular exercise at present, Exercise limited by: None identified  Goals    . other          After vacation, I plan to see a vein specialist to discuss leaky valve and shortness of breath.      Depression Screen PHQ 2/9 Scores 10/07/2015  PHQ - 2 Score 0    Fall Risk Fall Risk  10/07/2015  Falls in the past year? No    Cognitive Function: MMSE - Mini Mental State Exam 10/07/2015    Orientation to time 5  Orientation to Place 5  Registration 3  Attention/ Calculation 0  Recall 3  Language- name 2 objects 0  Language- repeat 1  Language- follow 3 step command 3  Language- read & follow direction 0  Write a sentence 0  Copy design 0  Total score 20   PLEASE NOTE: A Mini-Cog screen was completed. Maximum score is 20.  A value of 0 denotes this part of [place] MMSE was not completed or the patient failed this part of the Mini-Cog screening.   Mini-Cog Screening Orientation to Time - Max 5 pts Orientation to Place - Max 5 pts Registration - Max 3 pts Recall - Max 3 pts Language Repeat - Max 1 pts Language Follow 3 Step Command - Max 3 pts  Screening Tests Health Maintenance  Topic Date Due  . INFLUENZA VACCINE  03/05/2016 (Originally 10/05/2015)  . DEXA SCAN  03/05/2016 (Originally 04/23/2000)  . DTaP/Tdap/Td (1 - Tdap) 10/06/2016 (Originally 04/23/1954)  . ZOSTAVAX  10/06/2016 (Originally 04/24/1995)  . TETANUS/TDAP  10/06/2016 (Originally 04/23/1954)  . PNA vac Low Risk Adult  Completed  . FOOT EXAM  Excluded  . HEMOGLOBIN A1C  Excluded  . OPHTHALMOLOGY EXAM  Excluded  . URINE MICROALBUMIN  Excluded      Plan:     I have personally reviewed and addressed the Medicare Annual Wellness questionnaire and have noted the following in the patient's chart:  A. Medical and social history B. Use of alcohol, tobacco or illicit drugs  C. Current medications and supplements D. Functional ability and status E.  Nutritional status F.  Physical activity G. Advance directives H. List of other physicians I.  Hospitalizations, surgeries, and ER visits in previous 12 months J.  [place] to include hearing, vision, cognitive, depression L. Referrals and appointments - none  In addition, I have reviewed and discussed with patient certain preventive protocols, quality metrics, and best practice recommendations. A written personalized care plan for preventive  services as well as general preventive health recommendations were provided to patient.  See attached scanned questionnaire for additional information.   Signed,   Lindell Noe, MHA, BS, LPN Health Advisor

## 2015-10-09 NOTE — Progress Notes (Signed)
I reviewed health advisor's note, was available for consultation, and agree with documentation and plan.  

## 2015-10-11 ENCOUNTER — Other Ambulatory Visit: Payer: Medicare Other

## 2015-10-18 ENCOUNTER — Encounter: Payer: Self-pay | Admitting: Family Medicine

## 2015-10-18 ENCOUNTER — Ambulatory Visit (INDEPENDENT_AMBULATORY_CARE_PROVIDER_SITE_OTHER): Payer: Medicare Other | Admitting: Family Medicine

## 2015-10-18 VITALS — BP 140/76 | HR 76 | Temp 98.0°F | Ht 66.5 in | Wt 149.0 lb

## 2015-10-18 DIAGNOSIS — B349 Viral infection, unspecified: Secondary | ICD-10-CM | POA: Insufficient documentation

## 2015-10-18 DIAGNOSIS — R0600 Dyspnea, unspecified: Secondary | ICD-10-CM

## 2015-10-18 DIAGNOSIS — H6502 Acute serous otitis media, left ear: Secondary | ICD-10-CM | POA: Diagnosis not present

## 2015-10-18 DIAGNOSIS — K219 Gastro-esophageal reflux disease without esophagitis: Secondary | ICD-10-CM | POA: Insufficient documentation

## 2015-10-18 DIAGNOSIS — K5792 Diverticulitis of intestine, part unspecified, without perforation or abscess without bleeding: Secondary | ICD-10-CM

## 2015-10-18 MED ORDER — FLUTICASONE PROPIONATE 50 MCG/ACT NA SUSP: 2.0000 | Freq: Every day | NASAL | 3 refills | Status: DC

## 2015-10-18 MED ORDER — OMEPRAZOLE 40 MG PO CPDR: 40.0000 mg | DELAYED_RELEASE_CAPSULE | Freq: Every day | ORAL | 3 refills | Status: DC

## 2015-10-18 NOTE — Assessment & Plan Note (Signed)
Chronic, ongoing. Consider pulm referral.

## 2015-10-18 NOTE — Assessment & Plan Note (Signed)
Left serous otitis after bariatric changes - anticipate ETD. Suggested start flonase (sent to pharmacy) and nasal saline.

## 2015-10-18 NOTE — Progress Notes (Signed)
BP 140/76   Pulse 76   Temp 98 F (36.7 C)   Ht 5' 6.5" (1.689 m)   Wt 149 lb (67.6 kg)   SpO2 97%   BMI 23.69 kg/m    CC: CPE - converted to acute visit Subjective:    Patient ID: Sierra Kaiser, female    DOB: 05-20-1933, 82 y.o.   MRN: [id number]  HPI: Sierra Kaiser is a 82 y.o. female presenting on 10/18/2015 for Annual Exam; Ear Fullness (left); Vomiting; Shaking; Nasal Congestion; and Cough   Patient established with me 04/2015. Seen by Katha Cabal last week for medicare wellness visit, note reviewed. Failed R ear hearing screen (chronic).   Recent trip to [facility] with sisters, arrived back from plane trip - on way back got sick with nausea with vomiting episode x3, worsened cough productive of thick clear phlegm occasionally blood tinged mucous, L ear feels completely stopped up and was painful on plane trip yesterday. Very dry heat in [facility] [phone] F). Denies tooth pain, ST, PNdrainage, headaches, abd pain or vertigo. Normal stools. Sisters didn't get sick.   Chronic dyspnea - ongoing. Followed by Dr [name] and Dr Rockey Situ.   1+ month ago had root canal s/p antibiotic.  Had episode of acute diverticulitis confirmed by CT 09/2015, treated with augmentin antibiotic course.  Never smoker. No h/o asthma.   Relevant past medical, surgical, family and social history reviewed and updated as indicated. Interim medical history since our last visit reviewed. Allergies and medications reviewed and updated. Current Outpatient Prescriptions on File Prior to Visit  Medication Sig  . Biotin 5000 MCG TABS Take 5,000 mcg by mouth daily.  . diphenhydrAMINE-zinc acetate (BENADRYL) cream Apply 1 application topically 2 (two) times daily as needed for itching.  . furosemide (LASIX) 40 MG tablet Take 1 tablet (40 mg total) by mouth daily as needed.  [name] Glycol-Propyl Glycol (SYSTANE OP) Place 1 drop into both eyes daily as needed (dry eyes).  . Rivaroxaban (XARELTO) 15 MG TABS tablet Take 1  tablet (15 mg total) by mouth daily with supper.  . vitamin B-12 (CYANOCOBALAMIN) 1000 MCG tablet Take 1,000 mcg by mouth daily.   No current facility-administered medications on file prior to visit.     Review of Systems Per HPI unless specifically indicated in ROS section     Objective:    BP 140/76   Pulse 76   Temp 98 F (36.7 C)   Ht 5' 6.5" (1.689 m)   Wt 149 lb (67.6 kg)   SpO2 97%   BMI 23.69 kg/m   Wt Readings from Last 3 Encounters:  10/18/15 149 lb (67.6 kg)  10/07/15 147 lb 4 oz (66.8 kg)  09/29/15 145 lb 2 oz (65.8 kg)    Physical Exam  Constitutional: She appears well-developed and well-nourished. No distress.  HENT:  Head: Normocephalic and atraumatic.  Right Ear: Tympanic membrane, external ear and ear canal normal. Decreased hearing (chronic) is noted.  Left Ear: External ear and ear canal normal. Decreased hearing is noted.  Nose: No mucosal edema or rhinorrhea. Right sinus exhibits no maxillary sinus tenderness and no frontal sinus tenderness. Left sinus exhibits no maxillary sinus tenderness and no frontal sinus tenderness.  Mouth/Throat: Uvula is midline, oropharynx is clear and moist and mucous membranes are normal. No oropharyngeal exudate, posterior oropharyngeal edema, posterior oropharyngeal erythema or tonsillar abscesses.  Fluid behind L TM L lateral lip with fever blister Faint vesicles posterior oropharynx  Eyes:  Conjunctivae and EOM are normal. Pupils are equal, round, and reactive to light. No scleral icterus.  Neck: Normal range of motion. Neck supple.  Cardiovascular: Normal rate, regular rhythm, normal heart sounds and intact distal pulses.   No murmur heard. Pulmonary/Chest: Effort normal and breath sounds normal. No respiratory distress. She has no wheezes. She has no rales.  Abdominal: Soft. Bowel sounds are normal. She exhibits no distension and no mass. There is no tenderness. There is no rebound and no guarding.  Musculoskeletal: She  exhibits no edema.  Lymphadenopathy:    She has no cervical adenopathy.  Skin: Skin is warm and dry. No rash noted.  Psychiatric: She has a normal mood and affect.  Nursing note and vitals reviewed.  Results for orders placed or performed in visit on 10/07/15  Lipid panel  Result Value Ref Range   Cholesterol 203 (H) 0 - 200 mg/dL   Triglycerides 125.0 0.0 - 149.0 mg/dL   HDL 42.50 >39.00 mg/dL   VLDL 25.0 0.0 - 40.0 mg/dL   LDL Cholesterol 136 (H) 0 - 99 mg/dL   Total CHOL/HDL Ratio 5    NonHDL 160.93   Creatinine, serum  Result Value Ref Range   Creatinine, Ser 1.11 0.40 - 1.20 mg/dL  Ferritin  Result Value Ref Range   Ferritin 16.3 10.0 - 291.0 ng/mL  IBC panel  Result Value Ref Range   Iron 46 42 - 145 ug/dL   Transferrin 277.0 212.0 - 360.0 mg/dL   Saturation Ratios 11.9 (L) 20.0 - 50.0 %      Assessment & Plan:   Problem List Items Addressed This Visit    Acute diverticulitis    Completed augmentin course and abd symptoms have largely resolved.      Dyspnea    Chronic, ongoing. Consider pulm referral.       GERD (gastroesophageal reflux disease)    Worsening - increase omeprazole to 40mg  daily.      Relevant Medications   omeprazole (PRILOSEC) 40 MG capsule   Left acute serous otitis media    Left serous otitis after bariatric changes - anticipate ETD. Suggested start flonase (sent to pharmacy) and nasal saline.       Viral illness - Primary    URI with enteritis - anticipate viral illness after recent trip.  Supportive care discussed.        Other Visit Diagnoses   None.      Follow up plan: Return in about 4 weeks (around 11/15/2015), or as needed, for follow up visit.  [name], MD

## 2015-10-18 NOTE — Progress Notes (Signed)
Pre visit review using our clinic review tool, if applicable. No additional management support is needed unless otherwise documented below in the visit note. 

## 2015-10-18 NOTE — Assessment & Plan Note (Signed)
Completed augmentin course and abd symptoms have largely resolved.

## 2015-10-18 NOTE — Assessment & Plan Note (Signed)
URI with enteritis - anticipate viral illness after recent trip.  Supportive care discussed.

## 2015-10-18 NOTE — Assessment & Plan Note (Signed)
Worsening - increase omeprazole to 40mg  daily.

## 2015-10-18 NOTE — Patient Instructions (Addendum)
I think you have muffled hearing of left ear from pressure changes of recent air travel. You can use flonase nasal steroid to help with this.  For worsening heartburn - increase omeprazole to 40mg  daily.  I think other symptoms are viral illness related - supportive care for now.  Push fluids and rest. Nasal saline irrigation as well.  Let me know if not improving with above.

## 2015-10-20 ENCOUNTER — Telehealth: Payer: Self-pay | Admitting: *Deleted

## 2015-10-20 NOTE — Telephone Encounter (Signed)
Mr. Dunkleberger brought in a handicap renewal form to be filled out for Mrs. Connel. Please call them when it is ready to be picked up [phone]. Form placed in prescription tower.

## 2015-10-20 NOTE — Telephone Encounter (Signed)
In your IN box for completion.  

## 2015-10-21 NOTE — Telephone Encounter (Signed)
Let's discuss this at next visit next month. I've held on to application.

## 2015-10-25 NOTE — Telephone Encounter (Signed)
Patient notified as instructed by telephone and verbalized understanding. 

## 2015-11-11 ENCOUNTER — Other Ambulatory Visit: Payer: Self-pay

## 2015-11-11 NOTE — Telephone Encounter (Signed)
Pt left v/m requesting refill xarelto to [facility] [address]. Dr Darnell Level has not prescribed before but pt is out of med. Last refilled # 90 x 3 on 01/07/14 by Christell Faith PA.

## 2015-11-11 NOTE — Telephone Encounter (Signed)
Pt called back and advised Morey Hummingbird to disregard previous message. Does not need refill.

## 2015-11-15 ENCOUNTER — Ambulatory Visit (INDEPENDENT_AMBULATORY_CARE_PROVIDER_SITE_OTHER): Payer: Medicare Other | Admitting: Family Medicine

## 2015-11-15 ENCOUNTER — Encounter: Payer: Self-pay | Admitting: Family Medicine

## 2015-11-15 VITALS — BP 130/76 | HR 76 | Temp 97.8°F | Wt 147.0 lb

## 2015-11-15 DIAGNOSIS — I482 Chronic atrial fibrillation: Secondary | ICD-10-CM | POA: Diagnosis not present

## 2015-11-15 DIAGNOSIS — K219 Gastro-esophageal reflux disease without esophagitis: Secondary | ICD-10-CM

## 2015-11-15 DIAGNOSIS — Z23 Encounter for immunization: Secondary | ICD-10-CM | POA: Diagnosis not present

## 2015-11-15 DIAGNOSIS — R0600 Dyspnea, unspecified: Secondary | ICD-10-CM | POA: Diagnosis not present

## 2015-11-15 DIAGNOSIS — I4821 Permanent atrial fibrillation: Secondary | ICD-10-CM

## 2015-11-15 NOTE — Assessment & Plan Note (Addendum)
Appreciate cards/EP care. Sounds regular. Continue xarelto.

## 2015-11-15 NOTE — Progress Notes (Signed)
BP 130/76   Pulse 76   Temp 97.8 F (36.6 C) (Oral)   Wt 147 lb (66.7 kg)   BMI 23.37 kg/m    CC: f/u visit Subjective:    Patient ID: Sierra Kaiser, female    DOB: May 21, 1933, 82 y.o.   MRN: [id number]  HPI: Sierra Kaiser is a 82 y.o. female presenting on 11/15/2015 for Follow-up   See prior note for details. Feeling better. R ear improved after 4 days.   Ongoing chronic dyspnea - followed by Dr [name] and Dr Rockey Situ. H/o atrial fibrillation and complete heart block with pacemaker in place. Actually better over last few weeks - has even been able to work in the yard. Denies productive cough, dizziness, palpitations. No wheezing. Has not needed lasix 40mg  PRN. Not around smokers. No h/o smoking. No h/o asthma.   GERD - worse after supper meals. Improved on higher omeprazole dose 40mg . Occasional esophageal dysphagia a few times a week. Mild early satiety. Denies nausea/vomiting, weight changes.   Requests handicap placard due to increased unsteadiness on feet especially across uneven ground.   Upcoming bone density/mammogram Oct 7th.   Echocardiogram 06/2015 Study Conclusions - Left ventricle: The cavity size was normal. Systolic function was   normal. The estimated ejection fraction was in the range of 50%   to 55%. Hypokinesis of the apical and apical septal myocardium.   Left ventricular diastolic function parameters were normal. - Aortic valve: There was mild regurgitation. - Mitral valve: There was mild to moderate regurgitation. - Left atrium: The atrium was moderately dilated. - Tricuspid valve: There was mild-moderate regurgitation. - Pulmonary arteries: Systolic pressure was mildly elevated. PA   peak pressure: 40 mm Hg (S).  Relevant past medical, surgical, family and social history reviewed and updated as indicated. Interim medical history since our last visit reviewed. Allergies and medications reviewed and updated. Current Outpatient Prescriptions on File Prior  to Visit  Medication Sig  . Biotin 5000 MCG TABS Take 5,000 mcg by mouth daily.  . diphenhydrAMINE-zinc acetate (BENADRYL) cream Apply 1 application topically 2 (two) times daily as needed for itching.  . furosemide (LASIX) 40 MG tablet Take 1 tablet (40 mg total) by mouth daily as needed.  Marland Kitchen omeprazole (PRILOSEC) 40 MG capsule Take 1 capsule (40 mg total) by mouth daily.  [name] Glycol-Propyl Glycol (SYSTANE OP) Place 1 drop into both eyes daily as needed (dry eyes).  . Rivaroxaban (XARELTO) 15 MG TABS tablet Take 1 tablet (15 mg total) by mouth daily with supper.  . fluticasone (FLONASE) 50 MCG/ACT nasal spray Place 2 sprays into both nostrils daily. (Patient not taking: Reported on 11/15/2015)  . vitamin B-12 (CYANOCOBALAMIN) 1000 MCG tablet Take 1,000 mcg by mouth daily.   No current facility-administered medications on file prior to visit.     Review of Systems Per HPI unless specifically indicated in ROS section     Objective:    BP 130/76   Pulse 76   Temp 97.8 F (36.6 C) (Oral)   Wt 147 lb (66.7 kg)   BMI 23.37 kg/m   Wt Readings from Last 3 Encounters:  11/15/15 147 lb (66.7 kg)  10/18/15 149 lb (67.6 kg)  10/07/15 147 lb 4 oz (66.8 kg)    Physical Exam  Constitutional: She appears well-developed and well-nourished. No distress.  HENT:  Mouth/Throat: Oropharynx is clear and moist. No oropharyngeal exudate.  Cardiovascular: Normal rate, regular rhythm, normal heart sounds and intact distal pulses.  No murmur heard. Pulmonary/Chest: Effort normal and breath sounds normal. No respiratory distress. She has no wheezes. She has no rales.  Musculoskeletal: She exhibits no edema.  Skin: Skin is warm and dry. No rash noted.  Psychiatric: She has a normal mood and affect.  Nursing note and vitals reviewed.  Results for orders placed or performed in visit on 10/07/15  Lipid panel  Result Value Ref Range   Cholesterol 203 (H) 0 - 200 mg/dL   Triglycerides 125.0 0.0  - 149.0 mg/dL   HDL 42.50 >39.00 mg/dL   VLDL 25.0 0.0 - 40.0 mg/dL   LDL Cholesterol 136 (H) 0 - 99 mg/dL   Total CHOL/HDL Ratio 5    NonHDL 160.93   Creatinine, serum  Result Value Ref Range   Creatinine, Ser 1.11 0.40 - 1.20 mg/dL  Ferritin  Result Value Ref Range   Ferritin 16.3 10.0 - 291.0 ng/mL  IBC panel  Result Value Ref Range   Iron 46 42 - 145 ug/dL   Transferrin 277.0 212.0 - 360.0 mg/dL   Saturation Ratios 11.9 (L) 20.0 - 50.0 %  No results found for: VITAMINB12     Assessment & Plan:  Discussed she currently does not qualify for handicap placard criteria.  Problem List Items Addressed This Visit    Atrial fibrillation, permanent ([place])    Appreciate cards/EP care. Sounds regular. Continue xarelto.      Dyspnea - Primary    Improved this week. Will continue to monitor. Per patient prior workup unrevealing. Ambulatory pulse ox today.       GERD (gastroesophageal reflux disease)    Doing well on omeprazole 40mg  daily.       Other Visit Diagnoses    Need for influenza vaccination       Relevant Orders   Flu Vaccine QUAD 36+ mos PF IM (Fluarix & Fluzone Quad PF)       Follow up plan: Return in about 4 months (around 03/16/2016) for follow up visit.  [name], MD

## 2015-11-15 NOTE — Assessment & Plan Note (Signed)
Doing well on omeprazole 40mg  daily.

## 2015-11-15 NOTE — Progress Notes (Signed)
Pre visit review using our clinic review tool, if applicable. No additional management support is needed unless otherwise documented below in the visit note. 

## 2015-11-15 NOTE — Assessment & Plan Note (Signed)
Improved this week. Will continue to monitor. Per patient prior workup unrevealing. Ambulatory pulse ox today.

## 2015-11-15 NOTE — Patient Instructions (Addendum)
Flu shot today. Ambulatory pulse ox today.  I'm glad you're feeling better. Return as needed or in 4-6 months for spirometry.

## 2015-11-23 ENCOUNTER — Ambulatory Visit (INDEPENDENT_AMBULATORY_CARE_PROVIDER_SITE_OTHER): Payer: Medicare Other | Admitting: *Deleted

## 2015-11-23 ENCOUNTER — Ambulatory Visit (INDEPENDENT_AMBULATORY_CARE_PROVIDER_SITE_OTHER): Payer: Medicare Other | Admitting: Cardiovascular Disease

## 2015-11-23 ENCOUNTER — Encounter: Payer: Self-pay | Admitting: Cardiovascular Disease

## 2015-11-23 VITALS — BP 132/68 | HR 71 | Ht 67.0 in | Wt 141.8 lb

## 2015-11-23 DIAGNOSIS — I482 Chronic atrial fibrillation: Secondary | ICD-10-CM | POA: Diagnosis not present

## 2015-11-23 DIAGNOSIS — I700 Atherosclerosis of aorta: Secondary | ICD-10-CM | POA: Diagnosis not present

## 2015-11-23 DIAGNOSIS — I739 Peripheral vascular disease, unspecified: Secondary | ICD-10-CM | POA: Diagnosis not present

## 2015-11-23 DIAGNOSIS — E7800 Pure hypercholesterolemia, unspecified: Secondary | ICD-10-CM | POA: Diagnosis not present

## 2015-11-23 DIAGNOSIS — I4821 Permanent atrial fibrillation: Secondary | ICD-10-CM

## 2015-11-23 DIAGNOSIS — I5032 Chronic diastolic (congestive) heart failure: Secondary | ICD-10-CM

## 2015-11-23 DIAGNOSIS — N183 Chronic kidney disease, stage 3 unspecified: Secondary | ICD-10-CM

## 2015-11-23 MED ORDER — ROSUVASTATIN CALCIUM 20 MG PO TABS
20.0000 mg | ORAL_TABLET | Freq: Every day | ORAL | 11 refills | Status: DC
Start: 2015-11-23 — End: 2015-11-23

## 2015-11-23 MED ORDER — ROSUVASTATIN CALCIUM 20 MG PO TABS: 20.0000 mg | ORAL_TABLET | Freq: Every day | ORAL | 3 refills | Status: DC

## 2015-11-23 MED ORDER — RIVAROXABAN 15 MG PO TABS: 15.0000 mg | ORAL_TABLET | Freq: Every day | ORAL | 3 refills | Status: DC

## 2015-11-23 NOTE — Patient Instructions (Addendum)
Medication Instructions:   Please start crestor 1/2 pill daily Recheck cholesterol in 3 months  Labwork:  No new labs needed  Testing/Procedures:  No further testing at this time   Follow-Up: It was a pleasure seeing you in the office today. Please call us if you have new issues that need to be addressed before your next appt.  [phone]  Your physician wants you to follow-up in: 6 months.  You will receive a reminder letter in the mail two months in advance. If you don't receive a letter, please call our office to schedule the follow-up appointment.  If you need a refill on your cardiac medications before your next appointment, please call your pharmacy.

## 2015-11-23 NOTE — Progress Notes (Signed)
Remote pacemaker transmission.   

## 2015-11-23 NOTE — Progress Notes (Signed)
Cardiology Office Note  Date:  11/23/2015   ID:  Sierra, Kaiser [birth year], MRN [id number]  PCP:  [name], MD   Chief Complaint  Patient presents with  . other    6 month follow up. Meds reviewed by the patient. Pt. c/o shortness of breath.     HPI:  [name] is a 82 year old woman with a long history of atrial fibrillation, previously followed at [facility],   long history of atrial fibrillation, trial on numerous medications. She has been on Tikosyn  Started 2012. Previous to that she was on Multaq.  She had atrial fibrillation ablation in October 2014. Previous episodes of syncope in 2013, March 2015. Possibly from low blood pressure Chronic renal insufficiency AV node ablation, now with pacemaker February 2016 She presents for routine follow-up of her atrial fibrillation, paced rhythm  Echo 06/2015 normal EF, 50 to 55% Mild to mod MR, RVSP 40  She reports having recent abdominal pain Several weeks of left upper quadrant, left lower quadrant pain Was treated with antibiotics for diverticulitis with improvement of her symptoms Recurrence of her pain 2 days ago, took Tylenol, pain better yesterday, mild pain today on the left mid  Quadrant  CT scan reviewed with her in detail 09/29/2015 There is moderate diffuse aortic and iliac artery  Atherosclerosis Unable to exclude distal RCA coronary calcification  Creatinine clearance estimated with her in the 40s despite creatinine of 1.1 She is on anticoagulation, xarelto 15 mg daily  EKG on today's visit shows paced rhythm rate 71 bpm,  Other past medical history reviewed Echocardiogram March 2016 showing moderate MR, mildly elevated right heart pressures estimated 40 mmHg  Previously on metoprolol and tikosyn. Changed to amiodarone in preparation for cardioversion She underwent cardioversion 11/12/2013 which was successful but she reports going back to atrial fibrillation the next day   Lab work from [facility]  09/28/2012 showing creatinine 1.55, BUN 24 BNP 4800 EKG 09/28/2012 showing atrial fibrillation with RVR, rate 137 beats per minute followup EKG showing normal sinus rhythm with rate 82 beats per minute   Echocardiogram January 2011 was essentially normal. Repeat echocardiogram September 2014 again with normal LV function, normal study  Cardioversion may 2012 Cardioversion 12/06/2012 cardiac cath 05/17/2007 showing no significant CAD Stress test September 2014 showing no significant ischemia, normal ejection fraction   PMH:   has a past medical history of Anxiety; Benign colon polyp; CAD (coronary artery disease); CKD stage 3 secondary to diabetes ([place]); Esophageal reflux; GI bleeding (2012); Hiatal hernia; History of blood transfusion; History of diverticulitis; History of UTI; HTN (hypertension); Hyperlipidemia; Meniere's disease; Mitral regurgitation; Osteoporosis; Permanent atrial fibrillation ([place]); PUD (peptic ulcer disease); Raynaud's disease; SNHL (sensorineural hearing loss); Syncopal episodes; Tinnitus; Ulcerative colitis ([place]); and Urinary incontinence, urge.  PSH:    Past Surgical History:  Procedure Laterality Date  . APPENDECTOMY  1973  . AV NODE ABLATION N/A 05/06/2014   Procedure: AV NODE ABLATION;  Surgeon: [name], MD;  Location: [facility] CATH LAB;  Service: Cardiovascular;  Laterality: N/A;  . CARDIAC CATHETERIZATION  2014   [facility]  . CARDIAC ELECTROPHYSIOLOGY STUDY AND ABLATION    . CHOLECYSTECTOMY  2010  . COLONOSCOPY  2010   polyps  . HEMORRHOID SURGERY    . LAPAROTOMY  2012   EGD biopsy led to bleeding - needed laparotomy to stop bleed (Dr Jimmye Norman at [facility])  . PERMANENT PACEMAKER INSERTION N/A 04/22/2014   STJ single chamber pacemaker implanted by Dr  [name]  . TRANSESOPHAGEAL ECHOCARDIOGRAM WITH CARDIOVERSION  2014   [facility]  . UPPER GI ENDOSCOPY  2012   with polypectomy  . VAGINAL HYSTERECTOMY  1973   elective; ovaries remained    Current  Outpatient Prescriptions  Medication Sig Dispense Refill  . Biotin 5000 MCG TABS Take 5,000 mcg by mouth daily.    . diphenhydrAMINE-zinc acetate (BENADRYL) cream Apply 1 application topically 2 (two) times daily as needed for itching.    . fluticasone (FLONASE) 50 MCG/ACT nasal spray Place 2 sprays into both nostrils daily. 16 g 3  . furosemide (LASIX) 40 MG tablet Take 1 tablet (40 mg total) by mouth daily as needed. 90 tablet 2  . omeprazole (PRILOSEC) 40 MG capsule Take 1 capsule (40 mg total) by mouth daily. 30 capsule 3  . Polyethyl Glycol-Propyl Glycol (SYSTANE OP) Place 1 drop into both eyes daily as needed (dry eyes).    . Rivaroxaban (XARELTO) 15 MG TABS tablet Take 1 tablet (15 mg total) by mouth daily with supper. 90 tablet 3  . vitamin B-12 (CYANOCOBALAMIN) 1000 MCG tablet Take 1,000 mcg by mouth daily.    . rosuvastatin (CRESTOR) 20 MG tablet Take 1 tablet (20 mg total) by mouth daily. 90 tablet 3   No current facility-administered medications for this visit.      Allergies:   Contrast media [iodinated diagnostic agents]; Metronidazole; and Diltiazem hcl   Social History:  The patient  reports that she has never smoked. She has never used smokeless tobacco. She reports that she drinks about 1.2 oz of alcohol per week . She reports that she does not use drugs.   Family History:   family history includes Alcohol abuse in her brother; Arrhythmia in her mother; Cancer in her brother and sister; Diabetes in her mother and son; Heart failure in her brother and brother; Hypertension in her mother; Stroke in her brother.    Review of Systems: Review of Systems  Constitutional: Negative.   Respiratory: Negative.   Cardiovascular: Negative.   Gastrointestinal: Positive for abdominal pain.  Musculoskeletal: Negative.   Neurological: Negative.   Psychiatric/Behavioral: Negative.   All other systems reviewed and are negative.    PHYSICAL EXAM: VS:  BP 132/68 (BP Location: Left  Arm, Patient Position: Sitting, Cuff Size: Normal)   Pulse 71   Ht 5\' 7"  (1.702 m)   Wt 141 lb 12 oz (64.3 kg)   BMI 22.20 kg/m  , BMI Body mass index is 22.2 kg/m. GEN: Well nourished, well developed, in no acute distress  HEENT: normal  Neck: no JVD, carotid bruits, or masses Cardiac: RRR; no murmurs, rubs, or gallops,no edema , palpable but diminished pulses in the lower extremities dorsalis pedis Respiratory:  clear to auscultation bilaterally, normal work of breathing GI: soft, nontender, nondistended, + BS MS: no deformity or atrophy  Skin: warm and dry, no rash Neuro:  Strength and sensation are intact Psych: euthymic mood, full affect    Recent Labs: 05/31/2015: TSH 4.09 09/29/2015: ALT 13; BUN 21; Hemoglobin 13.0; Platelets 216.0; Potassium 4.4; Sodium 138 10/07/2015: Creatinine, Ser 1.11    Lipid Panel Lab Results  Component Value Date   CHOL 203 (H) 10/07/2015   HDL 42.50 10/07/2015   LDLCALC 136 (H) 10/07/2015   TRIG 125.0 10/07/2015      Wt Readings from Last 3 Encounters:  11/23/15 141 lb 12 oz (64.3 kg)  11/15/15 147 lb (66.7 kg)  10/18/15 149 lb (67.6 kg)  ASSESSMENT AND PLAN:  Atrial fibrillation, permanent ([place]) - Plan: EKG 12-Lead Paced rhythm, on anticoagulation  Hypercholesteremia - Plan: Lipid Profile She will start Crestor 10 mg daily Recommended recheck lipid panel 3 months Goal LDL less than 70 given extensive aortic atherosclerosis  Aortic atherosclerosis (HCC) -   long discussion concerning her aortic atherosclerosis and PAD seen on CT scan, moderate in nature . We have recommended more aggressive lipid treatment Suggested she start Crestor 10 mg daily (cut 20 mill grams pill in half). Recheck cholesterol in 3 months time  PAD (peripheral artery disease) ([place]) - Plan: EKG 12-Lead Significant aortic, iliac, femoral artery atherosclerosis, moderate She does have palpable but diminished lower extremity arterial pulses We have  offered lower extremity arterial ultrasound. She has declined at this time  CKD (chronic kidney disease) stage 3, GFR 30-59 ml/min Creatinine down to 1.1, taking less Lasix on average  Chronic diastolic CHF (congestive heart failure) ([place]) Appears relatively euvolemic, recommended she continue to hold her Lasix, take only as needed for weight gain, ankle edema, shortness of breath By holding Lasix, her creatinine has improved now down to 1.1   Total encounter time more than 25 minutes  Greater than 50% was spent in counseling and coordination of care with the patient  Disposition:   F/U  6 months   Orders Placed This Encounter  Procedures  . Lipid Profile  . EKG 12-Lead     Signed, Esmond Plants, M.D., Ph.D. 11/23/2015  [place], [place]

## 2015-11-24 ENCOUNTER — Encounter: Payer: Self-pay | Admitting: Cardiology

## 2015-11-28 ENCOUNTER — Telehealth: Payer: Self-pay | Admitting: Family Medicine

## 2015-11-28 NOTE — Telephone Encounter (Signed)
plz call - I received notice from cards that pt had recurrent LLQ abd pain. plz ensure feeling better - if not, recommend eval in office (let me know to work in on Monday or or Thursday).

## 2015-11-29 NOTE — Telephone Encounter (Signed)
Message left for patient to return my call.  

## 2015-11-30 ENCOUNTER — Telehealth: Payer: Self-pay | Admitting: Cardiovascular Disease

## 2015-11-30 NOTE — Telephone Encounter (Signed)
Pt c/o medication issue:  1. Name of Medication: Chelestrol generic   2. How are you currently taking this medication (dosage and times per day)? Marland Kitchen Was told in office to cut the 40 mg in half so she can get more in the mail.  Was to take 20 mg a day   3. Are you having a reaction (difficulty breathing--STAT)? No   4. What is your medication issue?  She got the refill from [facility]  It came 30 pills 20 mg and that is only one month supply  Is a bit confused  Please advise.   If we can just call in a 45m supply.

## 2015-11-30 NOTE — Telephone Encounter (Signed)
Spoke with the patient and told her that Dr. Rockey Situ sent in a new Rx for Crestor 20 mg one tablet daily #90 with 3 refills to [facility] at her office visit on 11/2015.

## 2015-12-01 NOTE — Telephone Encounter (Signed)
Could place tomorrow 9:30am if needed. Thanks.

## 2015-12-06 ENCOUNTER — Ambulatory Visit
Admission: RE | Admit: 2015-12-06 | Discharge: 2015-12-06 | Disposition: A | Discharge status: Home / Self Care | Payer: Medicare Other | Source: Ambulatory Visit | Attending: Family Medicine | Admitting: Family Medicine

## 2015-12-06 DIAGNOSIS — Z1239 Encounter for other screening for malignant neoplasm of breast: Secondary | ICD-10-CM

## 2015-12-06 DIAGNOSIS — Z1231 Encounter for screening mammogram for malignant neoplasm of breast: Secondary | ICD-10-CM | POA: Diagnosis not present

## 2015-12-06 DIAGNOSIS — M8589 Other specified disorders of bone density and structure, multiple sites: Secondary | ICD-10-CM | POA: Diagnosis not present

## 2015-12-06 DIAGNOSIS — E2839 Other primary ovarian failure: Secondary | ICD-10-CM | POA: Insufficient documentation

## 2015-12-11 ENCOUNTER — Encounter: Payer: Self-pay | Admitting: Family Medicine

## 2015-12-11 DIAGNOSIS — M8580 Other specified disorders of bone density and structure, unspecified site: Secondary | ICD-10-CM

## 2015-12-11 DIAGNOSIS — M810 Age-related osteoporosis without current pathological fracture: Secondary | ICD-10-CM | POA: Insufficient documentation

## 2015-12-11 HISTORY — DX: Other specified disorders of bone density and structure, unspecified site: M85.80

## 2015-12-13 ENCOUNTER — Encounter: Payer: Self-pay | Admitting: *Deleted

## 2015-12-16 ENCOUNTER — Inpatient Hospital Stay
Admission: RE | Admit: 2015-12-16 | Discharge: 2015-12-16 | Disposition: A | Discharge status: Home / Self Care | Payer: Self-pay | Source: Ambulatory Visit | Attending: *Deleted | Admitting: *Deleted

## 2015-12-16 ENCOUNTER — Other Ambulatory Visit: Payer: Self-pay | Admitting: *Deleted

## 2015-12-16 DIAGNOSIS — Z9289 Personal history of other medical treatment: Secondary | ICD-10-CM

## 2015-12-17 LAB — HM MAMMOGRAPHY

## 2015-12-21 ENCOUNTER — Encounter: Payer: Self-pay | Admitting: *Deleted

## 2015-12-21 LAB — CUP PACEART REMOTE DEVICE CHECK
Battery Remaining Longevity: 138 mo
Battery Remaining Percentage: 95.5 %
Battery Voltage: 2.99 V
Brady Statistic RV Percent Paced: 99 %
Date Time Interrogation Session: 20170919083900
Implantable Lead Implant Date: 20160217
Implantable Lead Location: 753860
Implantable Lead Model: 1948
Lead Channel Impedance Value: 600 Ohm
Lead Channel Pacing Threshold Amplitude: 0.75 V
Lead Channel Pacing Threshold Pulse Width: 0.4 ms
Lead Channel Sensing Intrinsic Amplitude: 12 mV
Lead Channel Setting Pacing Amplitude: 1 V
Lead Channel Setting Pacing Pulse Width: 0.4 ms
Lead Channel Setting Sensing Sensitivity: 2.5 mV
Pulse Gen Model: 2240
Pulse Gen Serial Number: 3050195

## 2016-01-30 IMAGING — DX DG CHEST 2V
2 series · 2 of 2 positions shown · non-contrast
Comparison: None.

CLINICAL DATA: Postop pacer placement

EXAM:
CHEST  2 VIEW

[chest pa]
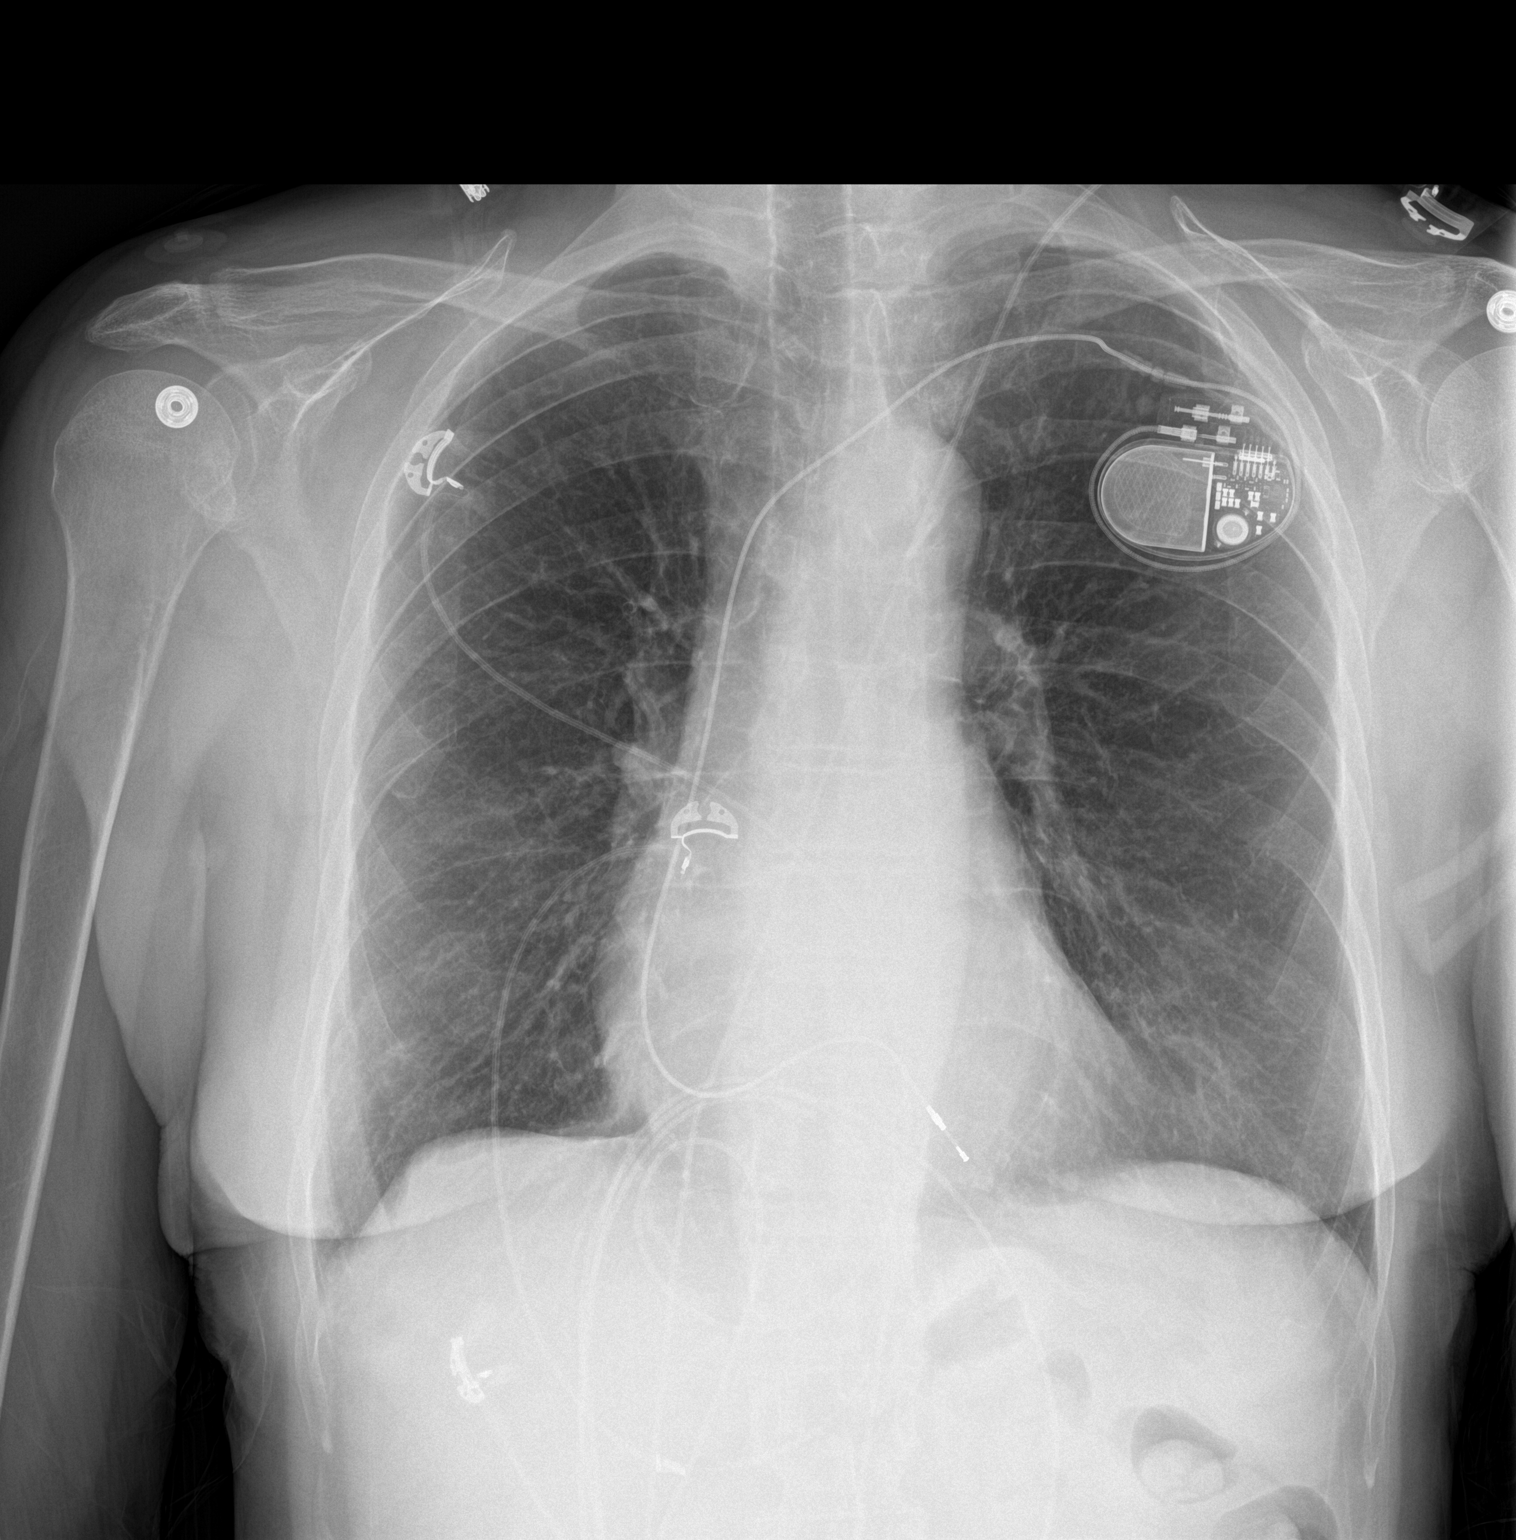

[chest lat]
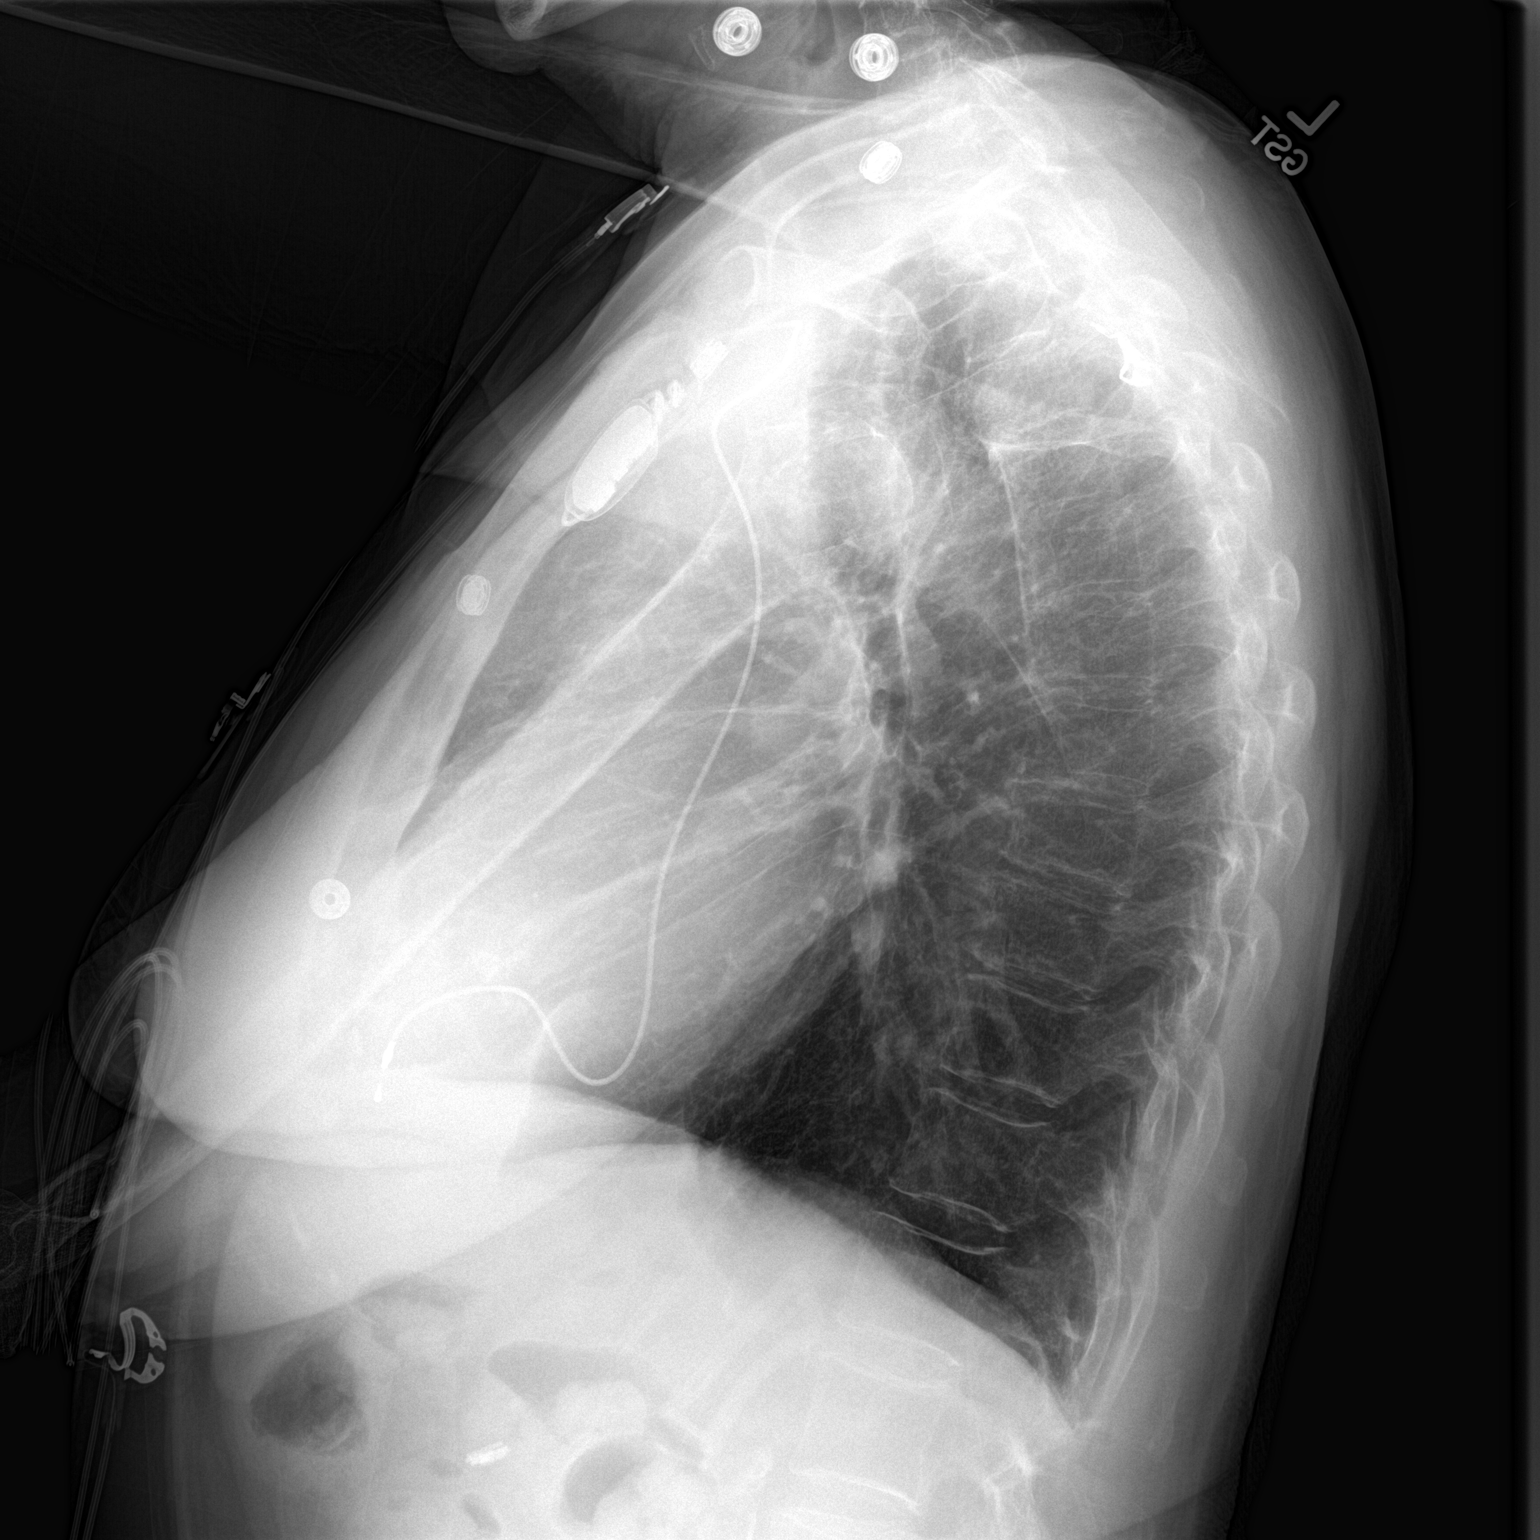

[2 of 2 positions shown; findings below may reference images not displayed]

FINDINGS: There is a transvenous pacer with lead in the expected location of
the right ventricular apex. There is no pneumothorax. The lungs are
clear. Pulmonary vasculature is normal. There are no effusions.
IMPRESSION: Transvenous pacer.  No pneumothorax.

## 2016-02-22 ENCOUNTER — Ambulatory Visit (INDEPENDENT_AMBULATORY_CARE_PROVIDER_SITE_OTHER): Payer: Medicare Other | Admitting: *Deleted

## 2016-02-22 DIAGNOSIS — I495 Sick sinus syndrome: Secondary | ICD-10-CM

## 2016-02-22 NOTE — Progress Notes (Signed)
Remote pacemaker transmission.   

## 2016-02-23 ENCOUNTER — Encounter: Payer: Self-pay | Admitting: Cardiology

## 2016-02-25 LAB — CUP PACEART REMOTE DEVICE CHECK
Battery Remaining Longevity: 138 mo
Battery Remaining Percentage: 95.5 %
Battery Voltage: 2.99 V
Brady Statistic RV Percent Paced: 99 %
Date Time Interrogation Session: 20171219111930
Implantable Lead Implant Date: 20160217
Implantable Lead Location: 753860
Implantable Lead Model: 1948
Implantable Pulse Generator Implant Date: 20160217
Lead Channel Impedance Value: 590 Ohm
Lead Channel Pacing Threshold Amplitude: 0.625 V
Lead Channel Pacing Threshold Pulse Width: 0.4 ms
Lead Channel Sensing Intrinsic Amplitude: 12 mV
Lead Channel Setting Pacing Amplitude: 0.875
Lead Channel Setting Pacing Pulse Width: 0.4 ms
Lead Channel Setting Sensing Sensitivity: 2.5 mV
Pulse Gen Model: 2240
Pulse Gen Serial Number: 3050195

## 2016-04-03 ENCOUNTER — Ambulatory Visit (INDEPENDENT_AMBULATORY_CARE_PROVIDER_SITE_OTHER): Payer: Medicare Other | Admitting: Family Medicine

## 2016-04-03 ENCOUNTER — Encounter: Payer: Self-pay | Admitting: Family Medicine

## 2016-04-03 VITALS — BP 112/74 | HR 76 | Temp 97.9°F | Wt 147.0 lb

## 2016-04-03 DIAGNOSIS — I5032 Chronic diastolic (congestive) heart failure: Secondary | ICD-10-CM

## 2016-04-03 DIAGNOSIS — I482 Chronic atrial fibrillation: Secondary | ICD-10-CM

## 2016-04-03 DIAGNOSIS — R0602 Shortness of breath: Secondary | ICD-10-CM | POA: Diagnosis not present

## 2016-04-03 DIAGNOSIS — Z639 Problem related to primary support group, unspecified: Secondary | ICD-10-CM

## 2016-04-03 DIAGNOSIS — I4821 Permanent atrial fibrillation: Secondary | ICD-10-CM

## 2016-04-03 MED ORDER — ALBUTEROL SULFATE (2.5 MG/3ML) 0.083% IN NEBU
2.5000 mg | INHALATION_SOLUTION | Freq: Once | RESPIRATORY_TRACT | Status: AC
Start: 2016-04-03 — End: 2016-04-03
  Administered 2016-04-03: 2.5 mg via RESPIRATORY_TRACT

## 2016-04-03 NOTE — Assessment & Plan Note (Signed)
Support provided - stressful few months.

## 2016-04-03 NOTE — Addendum Note (Signed)
Addended by: Royann Shivers A on: 04/03/2016 03:30 PM   Modules accepted: Orders

## 2016-04-03 NOTE — Assessment & Plan Note (Addendum)
Spirometry today - WNL. No better with bronchodilator.  Lungs reassuring. Check ambulatory pulse ox today as well.  ?deconditioning related dyspnea - suggested slowly increasing regular exercise in routine.  RTC 2 mo CPE.

## 2016-04-03 NOTE — Assessment & Plan Note (Signed)
Continue xarelto. Sounds regular today.

## 2016-04-03 NOTE — Patient Instructions (Addendum)
Spirometry today was looking normal.  Ambulatory pulse ox today.  Return in 2-3 months for physical.  ?shortness of breath from deconditioning - slowly increase activity as tolerated

## 2016-04-03 NOTE — Assessment & Plan Note (Addendum)
Seems euvolemic. Continue lasix 40mg  QD PRN

## 2016-04-03 NOTE — Progress Notes (Signed)
Pre visit review using our clinic review tool, if applicable. No additional management support is needed unless otherwise documented below in the visit note. 

## 2016-04-03 NOTE — Progress Notes (Signed)
BP 112/74   Pulse 76   Temp 97.9 F (36.6 C) (Oral)   Wt 147 lb (66.7 kg)   BMI 23.02 kg/m    CC: f/u visit Subjective:    Patient ID: Sierra Kaiser, female    DOB: [birth year], 83 y.o.   MRN: [id number]  HPI: Sierra Kaiser is a 83 y.o. female presenting on 04/03/2016 for Follow-up   Tough last few months - aunt with recent lung cancer dx, step grandson in prison, step daughter just started rehab - h/o drug use.   Chronic atrial fibrillation failed multiple medication, s/p AV ablation 12/2012 and pacemaker placement 04/2014.   Ongoing dyspnea with exertion over last 10 yrs, not at rest, notices marked fatigue with use of arms. Sitting and resting improves dyspnea. No orthopnea, sleeps on 1 pillow, no PNdyspnea. Some dizziness/lightheadedness.   Non smoker, no h/o asthma or COPD, no significant 2nd hand smoke exposure.   Pt remains frustrated at her ongoing exertional dyspnea.   Relevant past medical, surgical, family and social history reviewed and updated as indicated. Interim medical history since our last visit reviewed. Allergies and medications reviewed and updated. Current Outpatient Prescriptions on File Prior to Visit  Medication Sig  . diphenhydrAMINE-zinc acetate (BENADRYL) cream Apply 1 application topically 2 (two) times daily as needed for itching.  . fluticasone (FLONASE) 50 MCG/ACT nasal spray Place 2 sprays into both nostrils daily.  Marland Kitchen omeprazole (PRILOSEC) 40 MG capsule Take 1 capsule (40 mg total) by mouth daily.  [name] Glycol-Propyl Glycol (SYSTANE OP) Place 1 drop into both eyes daily as needed (dry eyes).  . Rivaroxaban (XARELTO) 15 MG TABS tablet Take 1 tablet (15 mg total) by mouth daily with supper.  . rosuvastatin (CRESTOR) 20 MG tablet Take 1 tablet (20 mg total) by mouth daily.  . Biotin 5000 MCG TABS Take 5,000 mcg by mouth daily.  . vitamin B-12 (CYANOCOBALAMIN) 1000 MCG tablet Take 1,000 mcg by mouth daily.   No current  facility-administered medications on file prior to visit.     Review of Systems Per HPI unless specifically indicated in ROS section     Objective:    BP 112/74   Pulse 76   Temp 97.9 F (36.6 C) (Oral)   Wt 147 lb (66.7 kg)   BMI 23.02 kg/m   Wt Readings from Last 3 Encounters:  04/03/16 147 lb (66.7 kg)  11/23/15 141 lb 12 oz (64.3 kg)  11/15/15 147 lb (66.7 kg)    Physical Exam  Constitutional: She appears well-developed and well-nourished. No distress.  HENT:  Mouth/Throat: Oropharynx is clear and moist. No oropharyngeal exudate.  Eyes: Conjunctivae are normal. Pupils are equal, round, and reactive to light.  Neck: Normal range of motion. Neck supple. No thyromegaly present.  Cardiovascular: Normal rate, regular rhythm, normal heart sounds and intact distal pulses.   No murmur heard. Pulmonary/Chest: Effort normal and breath sounds normal. No respiratory distress. She has no wheezes. She has no rales.  Musculoskeletal: She exhibits no edema.  Skin: Skin is warm and dry. No rash noted.  Psychiatric: She has a normal mood and affect.  Nursing note and vitals reviewed.     Assessment & Plan:   Problem List Items Addressed This Visit    Atrial fibrillation, permanent ([place])    Continue xarelto. Sounds regular today.       Relevant Medications   furosemide (LASIX) 40 MG tablet   Chronic diastolic CHF (congestive heart failure) ([place])  Seems euvolemic. Continue lasix 40mg  QD PRN      Relevant Medications   furosemide (LASIX) 40 MG tablet   Dyspnea - Primary    Spirometry today - WNL. No better with bronchodilator.  Lungs reassuring. Check ambulatory pulse ox today as well.  ?deconditioning related dyspnea - suggested slowly increasing regular exercise in routine.  RTC 2 mo CPE.       Relevant Orders   Spirometry with graph (Completed)       Follow up plan: Return in about 3 months (around 07/02/2016) for annual exam, prior fasting for blood work.  [name], MD

## 2016-04-14 ENCOUNTER — Telehealth: Payer: Self-pay | Admitting: Family Medicine

## 2016-04-14 MED ORDER — OMEPRAZOLE 40 MG PO CPDR
40.0000 mg | DELAYED_RELEASE_CAPSULE | Freq: Every day | ORAL | 3 refills | Status: DC
Start: 2016-04-14 — End: 2017-05-09

## 2016-04-14 NOTE — Telephone Encounter (Signed)
Pt called regarding her rx refill for a  3 month supply she would like it called into the mail order pharmacy.  Its the omeprazole 40 mg

## 2016-04-14 NOTE — Telephone Encounter (Signed)
HIPAA compliant message left with husband.

## 2016-04-14 NOTE — Telephone Encounter (Signed)
plz notify this was sent in. 

## 2016-05-04 ENCOUNTER — Telehealth: Payer: Self-pay | Admitting: Cardiovascular Disease

## 2016-05-04 NOTE — Telephone Encounter (Signed)
PA started on pt's Xarelto 15 mg through [email or web address].

## 2016-05-05 ENCOUNTER — Telehealth: Payer: Self-pay

## 2016-05-05 NOTE — Telephone Encounter (Signed)
Received denial of coverage for pt's Xarelto. Although pt has been on Xarelto 15 mg since 2012, [facility] has denied because:  "Xarelto 15 mg is denied as a benefit exclusion.  Drug coverage is limited only to those drugs covered under your medical benefit. "   We may appeal the decision by writing an appeal letter w/in 30 days to [facility] and mail to:  [facility] [employer] [facility] [place] [address] [place] [id number] [place] [address] Fax # [phone]  MD only may call for fast appeal [phone].

## 2016-05-05 NOTE — Telephone Encounter (Signed)
Placed Xarelto 15 mg tablet samples at front desk for pick up.

## 2016-05-05 NOTE — Telephone Encounter (Signed)
Pt would like some Xarleto samples. States she only has 3 left. Pt states she is going out of town today. Please call and let know if any available, if not send rx to [address]. She states her insurance has denied, and Dr. Rockey Situ is reviewing this. Please call.

## 2016-05-07 NOTE — Telephone Encounter (Signed)
Sounds like an error on their part patient can call and find which meds are covered for atrial fib eliquis?

## 2016-05-08 NOTE — Telephone Encounter (Signed)
Spoke w/ pt.  She reports that she was advised by her ins co that they would cover Xarelto, but it would cost $145 instead of $105. Advised her to call her ins co and see what other thinner they will cover and if this is more affordable.  She will call them today and call me back w/ their recommendation.

## 2016-05-08 NOTE — Telephone Encounter (Signed)
Spoke w/ pt.   She reports that she spoke w/ [facility] and they told her that Dr. Rockey Situ will need to call personally for fast MD appeal [phone]. Only MD may call w/in 15 days of denial; otherwise pt and MD will need to submit request in writing.  She states that they did not give her the name of any other meds that they will cover, just that Xarelto will be covered after they receive call from Dr. Rockey Situ.  She asks that he do this for her, as she has been on Xarelto for years and would prefer to not have to change meds.

## 2016-05-09 ENCOUNTER — Telehealth: Payer: Self-pay | Admitting: Cardiovascular Disease

## 2016-05-09 NOTE — Telephone Encounter (Signed)
Pharmacy calling stating he has patient on line and she is asking about the PA on xaretlo 15 mg  She is just asking if we and when can submit one for that  Please advise.

## 2016-05-09 NOTE — Telephone Encounter (Signed)
I spent a long time on the telephone getting bounced around from one number to another They now want somebody to call the preauthorization department [phone] Can someone call and find out why we need to preauthorize this medication She is on this for atrial fibrillation

## 2016-05-09 NOTE — Telephone Encounter (Signed)
Please see previous phone note.  We are waiting to if Dr. Rockey Situ can call MD fast appeal.

## 2016-05-10 MED ORDER — RIVAROXABAN 15 MG PO TABS: 15.0000 mg | ORAL_TABLET | Freq: Every day | ORAL | 3 refills | Status: DC

## 2016-05-10 NOTE — Telephone Encounter (Signed)
Spoke with Anderson Malta from [employer] and they said this medication was denied due to questions that were not answered. Reviewed questions with her and patient received approval. Approval/case number is [id number] and is dated from 04/10/2016 to 05/10/2017. She states that letter will be sent to the patient and another faxed to [place]. Will now call patient to make her aware.

## 2016-05-10 NOTE — Telephone Encounter (Signed)
Pt called back states her insurance HAS to talk with Dr. Rockey Situ. Pt also askls if DR. Gollan can change medication to something that is covered. Please call and advise

## 2016-05-10 NOTE — Telephone Encounter (Signed)
Spoke with patient and let her know that I spoke with [facility] and received approval for medication and sent in refills for her. Let her know if she should run low before getting medication to please give [place] a call and I will be happy to provide her with samples until she receives that medication. She verbalized understanding and had no further questions at this time. She was very appreciative for [place] calling and getting this medication approved.

## 2016-05-10 NOTE — Telephone Encounter (Signed)
See other telephone note. Spoke with patient and medication has been approved and sent in refill. She will call us if she needs samples until she receives her medications from [facility].

## 2016-05-10 NOTE — Telephone Encounter (Signed)
Pt has been approved for Xarelto 15 mg tablet 04/10/16-05/10/17.

## 2016-08-12 NOTE — Progress Notes (Signed)
Cardiology Office Note  Date:  08/15/2016   ID:  Sierra, Kaiser 1933/06/08, MRN [id number]  PCP:  Sierra Bush, MD   Chief Complaint  Patient presents with  . other    6 month f/u c/o sob, fatigue, dizziness and tingling/numbness fingers and toes. Meds reviewed verbally with pt.    HPI:  Sierra Kaiser is a 83 year old Kaiser with a long history of  atrial fibrillation, previously followed at [facility],  long history of atrial fibrillation, trial on numerous medications.  Tikosyn  Started 2012.Multaq.    atrial fibrillation ablation in October 2014. syncope in 2013, March 2015. Possibly from low blood pressure Chronic renal insufficiency AV node ablation, now with pacemaker February 2016 Aortic atherosclerosis, moderate on CT scan 09/2015 Anxiety/stress Mild to moderate mitral valve regurgitation She presents for routine follow-up of her atrial fibrillation, paced rhythm  Echo 06/2015 normal EF, 50 to 55% Mild to mod MR, RVSP 40  Increasing SOB, Went back on her lasix, took 4 days No change in breathing with Lasix Trying to walk more,  Golf cart is broken It is her impression that she should not have shortness of breath on exertion as she does currently  Tolerating crestor, started for moderate aortic 30 Lots of stress, anxiety. Lost her best friend Poor sleep. Does not have any energy   previous abdominal pain, better now Symptoms of occasional dizziness, fatigue, numbness in her hands, fingers Orthostatics done in the office today are normal despite symptoms of dizziness, feeling swimmy headed  CT scan reviewed with her in detail 09/29/2015 There is moderate diffuse aortic and iliac artery  Atherosclerosis Unable to exclude distal RCA coronary calcification  Creatinine clearance estimated with her in the 40s despite creatinine of 1.1 She is on anticoagulation, xarelto 15 mg daily  EKG on today's visit shows paced rhythm rate 71 bpm,  Other past medical  history reviewed Echocardiogram March 2016 showing moderate MR, mildly elevated right heart pressures estimated 40 mmHg  Previously on metoprolol and tikosyn. Changed to amiodarone in preparation for cardioversion She underwent cardioversion 11/12/2013 which was successful but she reports going back to atrial fibrillation the next day   Lab work from [facility] 09/28/2012 showing creatinine 1.55, BUN 24 BNP 4800 EKG 09/28/2012 showing atrial fibrillation with RVR, rate 137 beats per minute followup EKG showing normal sinus rhythm with rate 82 beats per minute   Echocardiogram January 2011 was essentially normal. Repeat echocardiogram September 2014 again with normal LV function, normal study  Cardioversion may 2012 Cardioversion 12/06/2012 cardiac cath 05/17/2007 showing no significant CAD Stress test September 2014 showing no significant ischemia, normal ejection fraction   PMH:   has a past medical history of Anxiety; CAD (coronary artery disease); CKD stage 3 secondary to diabetes ([place]); Esophageal reflux; GI bleeding (2012); Hiatal hernia; History of blood transfusion; History of diverticulitis; History of UTI; HTN (hypertension); Hyperlipidemia; Meniere's disease; Mitral regurgitation; Osteopenia (12/11/2015); Permanent atrial fibrillation ([place]); PUD (peptic ulcer disease); Raynaud's disease; SNHL (sensorineural hearing loss); Syncopal episodes; Tinnitus; Ulcerative colitis ([place]); and Urinary incontinence, urge.  PSH:    Past Surgical History:  Procedure Laterality Date  . APPENDECTOMY  1973  . AV NODE ABLATION N/A 05/06/2014   Procedure: AV NODE ABLATION;  Surgeon: [name], MD;  Location: [facility] CATH LAB;  Service: Cardiovascular;  Laterality: N/A;  . CARDIAC CATHETERIZATION  2014   [facility]  . CARDIAC ELECTROPHYSIOLOGY STUDY AND ABLATION    . CHOLECYSTECTOMY  2010  . COLONOSCOPY  2010   polyps  . HEMORRHOID SURGERY    . LAPAROTOMY  2012   EGD biopsy led to bleeding - needed  laparotomy to stop bleed (Dr [facility]e [facility] at [facility])  . PERMANENT PACEMAKER INSERTION N/A 04/22/2014   STJ single chamber pacemaker implanted by Dr [name]  . TRANSESOPHAGEAL ECHOCARDIOGRAM WITH CARDIOVERSION  2014   [facility]  . UPPER GI ENDOSCOPY  2012   with polypectomy  . VAGINAL HYSTERECTOMY  1973   elective; ovaries remained    Current Outpatient Prescriptions  Medication Sig Dispense Refill  . Biotin 5000 MCG TABS Take 5,000 mcg by mouth daily.    . diphenhydrAMINE-zinc acetate (BENADRYL) cream Apply 1 application topically 2 (two) times daily as needed for itching.    . fluticasone (FLONASE) 50 MCG/ACT nasal spray Place 2 sprays into both nostrils daily. 16 g 3  . furosemide (LASIX) 40 MG tablet Take 1 tablet (40 mg total) by mouth daily as needed.    Marland Kitchen omeprazole (PRILOSEC) 40 MG capsule Take 1 capsule (40 mg total) by mouth daily. 90 capsule 3  . Polyethyl Glycol-Propyl Glycol (SYSTANE OP) Place 1 drop into both eyes daily as needed (dry eyes).    . Rivaroxaban (XARELTO) 15 MG TABS tablet Take 1 tablet (15 mg total) by mouth daily with supper. 90 tablet 3  . rosuvastatin (CRESTOR) 20 MG tablet Take 1 tablet (20 mg total) by mouth daily. 90 tablet 3  . vitamin B-12 (CYANOCOBALAMIN) 1000 MCG tablet Take 1,000 mcg by mouth daily.     No current facility-administered medications for this visit.      Allergies:   Contrast media [iodinated diagnostic agents]; Metronidazole; and Diltiazem hcl   Social History:  The patient  reports that she has never smoked. She has never used smokeless tobacco. She reports that she drinks about 1.2 oz of alcohol per week . She reports that she does not use drugs.   Family History:   family history includes Alcohol abuse in her brother; Arrhythmia in her mother; Breast cancer (age of onset: 58) in her sister; Cancer in her brother and sister; Diabetes in her mother and son; Heart failure in her brother and brother; Hypertension in her mother;  Stroke in her brother.    Review of Systems: Review of Systems  Constitutional: Negative.   Respiratory: Positive for shortness of breath.   Cardiovascular: Negative.   Gastrointestinal: Negative.   Musculoskeletal: Negative.   Neurological: Positive for dizziness.  Psychiatric/Behavioral: Negative.   All other systems reviewed and are negative.    PHYSICAL EXAM: VS:  BP 124/70 (BP Location: Left Arm, Patient Position: Sitting, Cuff Size: Normal)   Pulse 71   Ht 5\' 8"  (1.727 m)   Wt 147 lb 4 oz (66.8 kg)   BMI 22.39 kg/m  , BMI Body mass index is 22.39 kg/m.  GEN: Well nourished, well developed, in no acute distress  HEENT: normal  Neck: no JVD, carotid bruits, or masses Cardiac: RRR; no murmurs, rubs, or gallops,no edema , palpable but diminished pulses in the lower extremities dorsalis pedis Respiratory:  clear to auscultation bilaterally, normal work of breathing GI: soft, nontender, nondistended, + BS Sierra: no deformity or atrophy  Skin: warm and dry, no rash Neuro:  Strength and sensation are intact Psych: euthymic mood, full affect    Recent Labs: 09/29/2015: ALT 13; BUN 21; Hemoglobin 13.0; Platelets 216.0; Potassium 4.4; Sodium 138 10/07/2015: Creatinine, Ser 1.11    Lipid Panel Lab Results  Component  Value Date   CHOL 203 (H) 10/07/2015   HDL 42.50 10/07/2015   LDLCALC 136 (H) 10/07/2015   TRIG 125.0 10/07/2015      Wt Readings from Last 3 Encounters:  08/15/16 147 lb 4 oz (66.8 kg)  04/03/16 147 lb (66.7 kg)  11/23/15 141 lb 12 oz (64.3 kg)       ASSESSMENT AND PLAN:  Atrial fibrillation, permanent ([place]) - Plan: EKG 12-Lead Paced rhythm, on anticoagulation Managed by Dr. [name]  Hypercholesteremia - Plan: Lipid Profile On Crestor 10 mg daily She was late for her lab work, we will check liver and lipid today  Aortic atherosclerosis ([place]) -  aortic atherosclerosis and PAD seen on CT scan, moderate in nature .  Goal LDL less than 70 Denies  claudication symptoms  PAD (peripheral artery disease) ([place]) - Plan: EKG 12-Lead Significant aortic, iliac, femoral artery atherosclerosis, moderate She does have palpable but diminished lower extremity arterial pulses Wepreviously  offered lower extremity arterial ultrasound. She has declined   CKD (chronic kidney disease) stage 3, GFR 30-59 ml/min Takes Lasix as needed  Chronic diastolic CHF (congestive heart failure) (HCC) Worsening shortness of breath on today's visit, appears relatively euvolemic No ankle swelling, no abdominal bloating weight is stable compared to January  We have offered stress testing for her shortness of breath, last 4 years ago. She has declined at this time   Total encounter time more than 25 minutes  Greater than 50% was spent in counseling and coordination of care with the patient  Disposition:   F/U  6 months   Orders Placed This Encounter  Procedures  . EKG 12-Lead     Signed, Esmond Plants, M.D., Ph.D. 08/15/2016  [place], [place]

## 2016-08-15 ENCOUNTER — Encounter: Payer: Self-pay | Admitting: Cardiovascular Disease

## 2016-08-15 ENCOUNTER — Encounter: Payer: Self-pay | Admitting: Internal Medicine

## 2016-08-15 ENCOUNTER — Ambulatory Visit (INDEPENDENT_AMBULATORY_CARE_PROVIDER_SITE_OTHER): Payer: Medicare Other | Admitting: Cardiovascular Disease

## 2016-08-15 ENCOUNTER — Ambulatory Visit (INDEPENDENT_AMBULATORY_CARE_PROVIDER_SITE_OTHER): Payer: Medicare Other | Admitting: Internal Medicine

## 2016-08-15 VITALS — BP 124/70 | HR 71 | Ht 68.0 in | Wt 147.2 lb

## 2016-08-15 DIAGNOSIS — N183 Chronic kidney disease, stage 3 unspecified: Secondary | ICD-10-CM

## 2016-08-15 DIAGNOSIS — Z950 Presence of cardiac pacemaker: Secondary | ICD-10-CM | POA: Diagnosis not present

## 2016-08-15 DIAGNOSIS — Z8679 Personal history of other diseases of the circulatory system: Secondary | ICD-10-CM

## 2016-08-15 DIAGNOSIS — I495 Sick sinus syndrome: Secondary | ICD-10-CM

## 2016-08-15 DIAGNOSIS — Z9889 Other specified postprocedural states: Secondary | ICD-10-CM | POA: Diagnosis not present

## 2016-08-15 DIAGNOSIS — I4821 Permanent atrial fibrillation: Secondary | ICD-10-CM

## 2016-08-15 DIAGNOSIS — I482 Chronic atrial fibrillation: Secondary | ICD-10-CM

## 2016-08-15 DIAGNOSIS — I442 Atrioventricular block, complete: Secondary | ICD-10-CM

## 2016-08-15 DIAGNOSIS — I5032 Chronic diastolic (congestive) heart failure: Secondary | ICD-10-CM

## 2016-08-15 NOTE — Patient Instructions (Signed)
Medication Instructions: - Your physician recommends that you continue on your current medications as directed. Please refer to the Current Medication list given to you today.  Labwork: - Your physician recommends that you have lab work today: BMP/CBC  Procedures/Testing: - Your physician has requested that you have an echocardiogram. Echocardiography is a painless test that uses sound waves to create images of your heart. It provides your doctor with information about the size and shape of your heart and how well your heart's chambers and valves are working. This procedure takes approximately one hour. There are no restrictions for this procedure.  Follow-Up: - Remote monitoring is used to monitor your Pacemaker of ICD from home. This monitoring reduces the number of office visits required to check your device to one time per year. It allows [place] to keep an eye on the functioning of your device to ensure it is working properly. You are scheduled for a device check from home on 11/14/16. You may send your transmission at any time that day. If you have a wireless device, the transmission will be sent automatically. After your physician reviews your transmission, you will receive a postcard with your next transmission date.  - Your physician wants you to follow-up in: 1 year with Dr. [name]. You will receive a reminder letter in the mail two months in advance. If you don't receive a letter, please call our office to schedule the follow-up appointment.   Any Additional Special Instructions Will Be Listed Below (If Applicable).     If you need a refill on your cardiac medications before your next appointment, please call your pharmacy.

## 2016-08-15 NOTE — Patient Instructions (Signed)
Medication Instructions:   No medication changes made  Labwork:  Liver and lipid today  Testing/Procedures:  No further testing at this time   I recommend watching educational videos on topics of interest to you at:       [email or web address].goemmi.com  Enter code: [email or web address]    Follow-Up: It was a pleasure seeing you in the office today. Please call us if you have new issues that need to be addressed before your next appt.  [phone]  Your physician wants you to follow-up in: 6 months.  You will receive a reminder letter in the mail two months in advance. If you don't receive a letter, please call our office to schedule the follow-up appointment.  If you need a refill on your cardiac medications before your next appointment, please call your pharmacy.

## 2016-08-15 NOTE — Progress Notes (Signed)
Patient Care Team: [name], MD as PCP - General (Family Medicine) Rockey Situ Kathlene November, MD as Consulting Physician (Cardiology)   HPI  Sierra Kaiser is a 83 y.o. female Seen in follow-up for AV ablation for permanent atrial fibrillation with poorly controlled ventricular response. Initially she was very short of breath and the device was reprogrammed  There had been gradual improvement She had undergone previous atrial fibrillation ablation at [facility].  Echo 3/16 demonstrated normal LV function  with moderate MR. Echocardiogram 8/15 described Mild-moderate MRShe [facility]. This report was read>> no description of eccentric MR was noted.  Echo 4/17 demonstrated normal LV function again with mild-moderate MR and mild-moderate TR.   She continues to struggle with worsening exercise tolerance and fatigue. It is relieved by rest. It is unassociated with chest discomfort. She sleeps poorly but denies nocturnal dyspnea or orthopnea.    There great deal of psychosocial stressors. Her family is thought that she needed to address this. She does not agree. Her family has gotten her appointment to see Dr. [name] at [facility]     Past Medical History:  Diagnosis Date  . Anxiety   . CAD (coronary artery disease)   . CKD stage 3 secondary to diabetes ([place])   . Esophageal reflux   . GI bleeding 2012   during EGD necessitating open surgery  . Hiatal hernia   . History of blood transfusion   . History of diverticulitis   . History of UTI   . HTN (hypertension)   . Hyperlipidemia   . Meniere's disease   . Mitral regurgitation   . Osteopenia 12/11/2015   T score -1.2 femur, -2.0 spine (12/2015)  . Permanent atrial fibrillation ([place])    a. permanent b. s/p PVI RFA at [facility] 10/14 c. failed amio (neuro toxicity) and Tikosyn d. 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 PUD (peptic ulcer disease)   . Raynaud's disease   . SNHL  (sensorineural hearing loss)    right ear  . Syncopal episodes   . Tinnitus   . Ulcerative colitis ([place])    per prior records  . Urinary incontinence, urge     Past Surgical History:  Procedure Laterality Date  . APPENDECTOMY  1973  . AV NODE ABLATION N/A 05/06/2014   Procedure: AV NODE ABLATION;  Surgeon: [name], MD;  Location: [facility] CATH LAB;  Service: Cardiovascular;  Laterality: N/A;  . CARDIAC CATHETERIZATION  2014   [facility]  . CARDIAC ELECTROPHYSIOLOGY STUDY AND ABLATION    . CHOLECYSTECTOMY  2010  . COLONOSCOPY  2010   polyps  . HEMORRHOID SURGERY    . LAPAROTOMY  2012   EGD biopsy led to bleeding - needed laparotomy to stop bleed (Dr Jimmye Norman at [facility])  . PERMANENT PACEMAKER INSERTION N/A 04/22/2014   STJ single chamber pacemaker implanted by Dr [name]  . TRANSESOPHAGEAL ECHOCARDIOGRAM WITH CARDIOVERSION  2014   [facility]  . UPPER GI ENDOSCOPY  2012   with polypectomy  . VAGINAL HYSTERECTOMY  1973   elective; ovaries remained    Current Outpatient Prescriptions  Medication Sig Dispense Refill  . Biotin 5000 MCG TABS Take 5,000 mcg by mouth daily.    . diphenhydrAMINE-zinc acetate (BENADRYL) cream Apply 1 application topically 2 (two) times daily as needed for itching.    . fluticasone (FLONASE) 50 MCG/ACT nasal spray Place 2 sprays into both nostrils daily.  16 g 3  . furosemide (LASIX) 40 MG tablet Take 1 tablet (40 mg total) by mouth daily as needed.    Marland Kitchen omeprazole (PRILOSEC) 40 MG capsule Take 1 capsule (40 mg total) by mouth daily. 90 capsule 3  . Polyethyl Glycol-Propyl Glycol (SYSTANE OP) Place 1 drop into both eyes daily as needed (dry eyes).    . Rivaroxaban (XARELTO) 15 MG TABS tablet Take 1 tablet (15 mg total) by mouth daily with supper. 90 tablet 3  . rosuvastatin (CRESTOR) 20 MG tablet Take 1 tablet (20 mg total) by mouth daily. 90 tablet 3  . vitamin B-12 (CYANOCOBALAMIN) 1000 MCG tablet Take 1,000 mcg by mouth daily.     No current  facility-administered medications for this visit.     Allergies  Allergen Reactions  . Contrast Media [Iodinated Diagnostic Agents] Hives       . Metronidazole Itching and Other (See Comments)    Hands really red  . Diltiazem Hcl Itching and Rash    Review of Systems negative except from HPI and PMH  Physical Exam BP 124/70 (BP Location: Left Arm, Patient Position: Sitting, Cuff Size: Normal)   Pulse 71   Ht 5\' 8"  (1.727 m)   Wt 147 lb 4 oz (66.8 kg)   BMI 22.39 kg/m  Well developed and nourished in no acute distress HENT normal Neck supple with JVP 8 Carotids brisk and full without bruits Clear Regular rate and rhythm, 2/6 apex Abd-soft with active BS without hepatomegaly No Clubbing cyanosis edema Skin-warm and dry A & Oriented  Grossly normal sensory and motor function    ECG atrial fibrillation with ventricular pacing rate of 71   Assessment and  Plan  Atrial fibrillation  Complete heart block status post AV ablation  HFpEF   Normal LV function   MR mod  Anticoagulation    We will reassess her MR by echo as well as look at LV function. 3% of patients per year at pacemaker associated cardiomyopathy  She is scheduled to go see Dr. [name] at [facility] for further assessment of her MR. I think this is a great idea. We have talked today about stress echo to see if there is worsening of MR with exertion or at TEE to see if there is a significant central component causing a transthoracic to underestimate the MR.  Her heart rate excursion is relatively brisk since that 10% of her beats are faster than 90.  An interval strategy would be to decrease her rate response slope and/or increase her threshold. We will wait until he sees Dr. [name]. On Anticoagulation;  No bleeding issues   We'll check her anticoagulation laboratories today.  I have discussed the above issues with Dr. Rockey Situ.  Marland Kitchen

## 2016-08-16 LAB — BASIC METABOLIC PANEL
BUN/Creatinine Ratio: 25 (ref 12–28)
BUN: 31 mg/dL — ABNORMAL HIGH (ref 8–27)
CO2: 21 mmol/L (ref 20–29)
Calcium: 9.8 mg/dL (ref 8.7–10.3)
Chloride: 102 mmol/L (ref 96–106)
Creatinine, Ser: 1.23 mg/dL — ABNORMAL HIGH (ref 0.57–1.00)
GFR calc Af Amer: 48 mL/min/{1.73_m2} — ABNORMAL LOW (ref >59)
GFR calc non Af Amer: 41 mL/min/{1.73_m2} — ABNORMAL LOW (ref >59)
Glucose: 97 mg/dL (ref 65–99)
Potassium: 4.5 mmol/L (ref 3.5–5.2)
Sodium: 140 mmol/L (ref 134–144)

## 2016-08-16 LAB — CBC WITH DIFFERENTIAL/PLATELET
Basophils Absolute: 0.1 10*3/uL (ref 0.0–0.2)
Basos: 1 %
EOS (ABSOLUTE): 0.2 10*3/uL (ref 0.0–0.4)
Eos: 3 %
Hematocrit: 39.3 % (ref 34.0–46.6)
Hemoglobin: 12.7 g/dL (ref 11.1–15.9)
Immature Grans (Abs): 0 10*3/uL (ref 0.0–0.1)
Immature Granulocytes: 0 %
Lymphocytes Absolute: 2.4 10*3/uL (ref 0.7–3.1)
Lymphs: 34 %
MCH: 27.5 pg (ref 26.6–33.0)
MCHC: 32.3 g/dL (ref 31.5–35.7)
MCV: 85 fL (ref 79–97)
Monocytes Absolute: 0.4 10*3/uL (ref 0.1–0.9)
Monocytes: 6 %
Neutrophils Absolute: 3.9 10*3/uL (ref 1.4–7.0)
Neutrophils: 56 %
Platelets: 216 10*3/uL (ref 150–379)
RBC: 4.62 x10E6/uL (ref 3.77–5.28)
RDW: 16.3 % — ABNORMAL HIGH (ref 12.3–15.4)
WBC: 7 10*3/uL (ref 3.4–10.8)

## 2016-08-16 LAB — LIPID PANEL
Chol/HDL Ratio: 2.8 ratio (ref 0.0–4.4)
Cholesterol, Total: 159 mg/dL (ref 100–199)
HDL: 57 mg/dL (ref >39)
LDL Calculated: 73 mg/dL (ref 0–99)
Triglycerides: 143 mg/dL (ref 0–149)
VLDL Cholesterol Cal: 29 mg/dL (ref 5–40)

## 2016-08-16 LAB — HEPATIC FUNCTION PANEL
ALT: 11 IU/L (ref 0–32)
AST: 12 IU/L (ref 0–40)
Albumin: 4.5 g/dL (ref 3.5–4.7)
Alkaline Phosphatase: 65 IU/L (ref 39–117)
Bilirubin Total: 0.4 mg/dL (ref 0.0–1.2)
Bilirubin, Direct: 0.09 mg/dL (ref 0.00–0.40)
Total Protein: 7.1 g/dL (ref 6.0–8.5)

## 2016-08-18 ENCOUNTER — Other Ambulatory Visit: Payer: Self-pay | Admitting: Internal Medicine

## 2016-08-23 ENCOUNTER — Ambulatory Visit (INDEPENDENT_AMBULATORY_CARE_PROVIDER_SITE_OTHER): Payer: Medicare Other

## 2016-08-23 ENCOUNTER — Other Ambulatory Visit: Payer: Self-pay

## 2016-08-23 DIAGNOSIS — I482 Chronic atrial fibrillation: Secondary | ICD-10-CM | POA: Diagnosis not present

## 2016-08-23 DIAGNOSIS — I4821 Permanent atrial fibrillation: Secondary | ICD-10-CM

## 2016-08-27 LAB — CUP PACEART INCLINIC DEVICE CHECK
Battery Remaining Longevity: 135 mo
Battery Voltage: 2.99 V
Brady Statistic RA Percent Paced: 0 %
Brady Statistic RV Percent Paced: 99.66 %
Date Time Interrogation Session: 20180612145055
Implantable Lead Implant Date: 20160217
Implantable Lead Location: 753860
Implantable Lead Model: 1948
Implantable Pulse Generator Implant Date: 20160217
Lead Channel Impedance Value: 662.5 Ohm
Lead Channel Pacing Threshold Amplitude: 0.625 V
Lead Channel Pacing Threshold Pulse Width: 0.4 ms
Lead Channel Setting Pacing Amplitude: 0.875
Lead Channel Setting Pacing Pulse Width: 0.4 ms
Lead Channel Setting Sensing Sensitivity: 2.5 mV
Pulse Gen Model: 2240
Pulse Gen Serial Number: 3050195

## 2016-08-30 ENCOUNTER — Telehealth: Payer: Self-pay | Admitting: *Deleted

## 2016-08-30 NOTE — Telephone Encounter (Signed)
Calling to troubleshoot PPM home monitor. Monitor plug was not connected properly. Transmission in process. Sierra Kaiser is appreciative of help. Direct # given to [facility] in case of further issues.

## 2016-10-06 ENCOUNTER — Other Ambulatory Visit: Payer: Self-pay | Admitting: Otolaryngology

## 2016-10-06 DIAGNOSIS — R42 Dizziness and giddiness: Secondary | ICD-10-CM

## 2016-10-09 ENCOUNTER — Encounter: Payer: Medicare Other | Attending: Internal Medicine

## 2016-10-09 VITALS — Ht 67.5 in | Wt 146.8 lb

## 2016-10-09 DIAGNOSIS — I5032 Chronic diastolic (congestive) heart failure: Secondary | ICD-10-CM

## 2016-10-09 DIAGNOSIS — I509 Heart failure, unspecified: Secondary | ICD-10-CM | POA: Insufficient documentation

## 2016-10-09 NOTE — Progress Notes (Signed)
Daily Session Note  Patient Details  Name: Sierra Kaiser MRN: [id number] Date of Birth: [birth year] Referring Provider:     Pulmonary Rehab from 10/09/2016 in [facility] Cardiac [facility] Pulmonary Rehab  Referring Provider  [name] MD      Encounter Date: 10/09/2016  Check In:     Session Check In - 10/09/16 1554      Check-In   Location [facility]-Cardiac & Pulmonary Rehab   Staff Present Alberteen Sam, MA, ACSM RCEP, Exercise Physiologist;[place] Flavia Shipper   Supervising physician immediately available to respond to emergencies [facility] immediately available ER MD   Physician(s) Dr. Kerman Passey and Jimmye Norman   Medication changes reported     No   Fall or balance concerns reported    No   Tobacco Cessation --  never used   Warm-up and Cool-down Not performed (comment)  Medical Evaluation   Resistance Training Performed No   VAD Patient? No     Pain Assessment   Currently in Pain? No/denies   Multiple Pain Sites No           Exercise Prescription Changes - 10/09/16 1500      Response to Exercise   Blood Pressure (Admit) 146/64   Blood Pressure (Exercise) 166/64   Blood Pressure (Exit) 132/66   Heart Rate (Admit) 73 bpm   Heart Rate (Exercise) 96 bpm   Heart Rate (Exit) 74 bpm   Oxygen Saturation (Admit) 96 %   Oxygen Saturation (Exercise) 96 %   Oxygen Saturation (Exit) 99 %   Rating of Perceived Exertion (Exercise) 12   Perceived Dyspnea (Exercise) 2   Symptoms SOB, Right sided chest pain 2/10   Comments walk test results      History  Smoking Status  . Never Smoker  Smokeless Tobacco  . Never Used    Goals Met:  Exercise tolerated well Personal goals reviewed Queuing for purse lip breathing  Goals Unmet:  Not Applicable  Comments: Medical evaluation Initial ITP created and sent to Dr. Emily Filbert Director of [place] for review and changes.   Dr. Emily Filbert is Medical Director for [place] and [facility]monary  [facility].

## 2016-10-09 NOTE — Progress Notes (Signed)
Pulmonary Individual Treatment Plan  Patient Details  Name: Sierra Kaiser MRN: [id number] Date of Birth: 1933-12-12 Referring Provider:     Pulmonary Rehab from 10/09/2016 in [facility] Cardiac and Pulmonary Rehab  Referring Provider  [name] MD      Initial Encounter Date:    Pulmonary Rehab from 10/09/2016 in [facility] Cardiac and Pulmonary Rehab  Date  10/09/16  Referring Provider  [name] MD      Visit Diagnosis: Chronic diastolic congestive heart failure ([place])  Patient's Home Medications on Admission:  Current Outpatient Prescriptions:  .  Biotin 5000 MCG TABS, Take 5,000 mcg by mouth daily., Disp: , Rfl:  .  diphenhydrAMINE-zinc acetate (BENADRYL) cream, Apply 1 application topically 2 (two) times daily as needed for itching., Disp: , Rfl:  .  fluticasone (FLONASE) 50 MCG/ACT nasal spray, Place 2 sprays into both nostrils daily., Disp: 16 g, Rfl: 3 .  furosemide (LASIX) 40 MG tablet, Take 1 tablet (40 mg total) by mouth daily as needed., Disp: , Rfl:  .  omeprazole (PRILOSEC) 40 MG capsule, Take 1 capsule (40 mg total) by mouth daily., Disp: 90 capsule, Rfl: 3 .  Polyethyl Glycol-Propyl Glycol (SYSTANE OP), Place 1 drop into both eyes daily as needed (dry eyes)., Disp: , Rfl:  .  Rivaroxaban (XARELTO) 15 MG TABS tablet, Take 1 tablet (15 mg total) by mouth daily with supper., Disp: 90 tablet, Rfl: 3 .  rosuvastatin (CRESTOR) 20 MG tablet, Take 1 tablet (20 mg total) by mouth daily., Disp: 90 tablet, Rfl: 3 .  vitamin B-12 (CYANOCOBALAMIN) 1000 MCG tablet, Take 1,000 mcg by mouth daily., Disp: , Rfl:   Past Medical History: Past Medical History:  Diagnosis Date  . Anxiety   . CAD (coronary artery disease)   . CKD stage 3 secondary to diabetes ([place])   . Esophageal reflux   . GI bleeding 2012   during EGD necessitating open surgery  . Hiatal hernia   . History of blood transfusion   . History of diverticulitis   . History of UTI   . HTN (hypertension)   . Hyperlipidemia    . Meniere's disease   . Mitral regurgitation   . Osteopenia 12/11/2015   T score -1.2 femur, -2.0 spine (12/2015)  . Permanent atrial fibrillation ([place])    a. permanent b. s/p PVI RFA at [facility] 10/14 c. failed amio (neuro toxicity) and Tikosyn d. 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 PUD (peptic ulcer disease)   . Raynaud's disease   . SNHL (sensorineural hearing loss)    right ear  . Syncopal episodes   . Tinnitus   . Ulcerative colitis ([place])    per prior records  . Urinary incontinence, urge     Tobacco Use: History  Smoking Status  . Never Smoker  Smokeless Tobacco  . Never Used    Labs: Recent Review Flowsheet Data    Labs for ITP Cardiac and Pulmonary Rehab Latest Ref Rng & Units 03/26/2014 10/07/2015 08/15/2016   Cholestrol 100 - 199 mg/dL 293(A) 203(H) 159   LDLCALC 0 - 99 mg/dL 206 136(H) 73   HDL >39 mg/dL 45 42.50 57   Trlycerides 0 - 149 mg/dL 210(A) 125.0 143       ADL UCSD:     Pulmonary Assessment Scores    Row Name 10/09/16 1446         ADL UCSD   ADL Phase Entry  SOB Score total 63     Rest 0     Walk 2     Stairs 4     Bath 4     Dress 3     Shop 3       CAT Score   CAT Score 22       mMRC Score   mMRC Score 2  Simultaneous filing. User may not have seen previous data.        Pulmonary Function Assessment:     Pulmonary Function Assessment - 10/09/16 1559      Breath   Bilateral Breath Sounds Clear   Shortness of Breath Fear of Shortness of Breath      Exercise Target Goals: Date: 10/09/16  Exercise Program Goal: Individual exercise prescription set with THRR, safety & activity barriers. Participant demonstrates ability to understand and report RPE using BORG scale, to self-measure pulse accurately, and to acknowledge the importance of the exercise prescription.  Exercise Prescription Goal: Starting with aerobic activity 30 plus minutes a day, 3 days per week for initial exercise  prescription. Provide home exercise prescription and guidelines that participant acknowledges understanding prior to discharge.  Activity Barriers & Risk Stratification:     Activity Barriers & Cardiac Risk Stratification - 10/09/16 1553      Activity Barriers & Cardiac Risk Stratification   Activity Barriers Deconditioning;Muscular Weakness;Shortness of Breath      6 Minute Walk:     6 Minute Walk    Row Name 10/09/16 1551         6 Minute Walk   Phase Initial     Distance 1250 feet     Walk Time 6 minutes     # of Rest Breaks 0     MPH 2.37     METS 2.72     RPE 12     Perceived Dyspnea  2     VO2 Peak 9.5     Symptoms Yes (comment)     Comments SOB,  Right sided chest pain 2/10     Resting HR 73 bpm     Resting BP 146/64     Max Ex. HR 96 bpm     Max Ex. BP 166/64     2 Minute Post BP 132/66       Interval HR   Baseline HR 73     1 Minute HR 94     2 Minute HR 96     3 Minute HR 95     4 Minute HR 94     5 Minute HR 94     6 Minute HR 94     2 Minute Post HR 74     Interval Heart Rate? Yes       Interval Oxygen   Interval Oxygen? Yes     Baseline Oxygen Saturation % 96 %     Baseline Liters of Oxygen 0 L  Room Air     1 Minute Oxygen Saturation % 97 %     1 Minute Liters of Oxygen 0 L     2 Minute Oxygen Saturation % 96 %     2 Minute Liters of Oxygen 0 L     3 Minute Oxygen Saturation % 97 %     3 Minute Liters of Oxygen 0 L     4 Minute Oxygen Saturation % 97 %     4 Minute Liters of Oxygen 0 L     5  Minute Oxygen Saturation % 97 %     5 Minute Liters of Oxygen 0 L     6 Minute Oxygen Saturation % 97 %     6 Minute Liters of Oxygen 0 L     2 Minute Post Oxygen Saturation % 99 %     2 Minute Post Liters of Oxygen 0 L       Oxygen Initial Assessment:     Oxygen Initial Assessment - 10/09/16 1450      Home Oxygen   Home Oxygen Device None   Sleep Oxygen Prescription None   Home Exercise Oxygen Prescription None   Home at Rest Exercise  Oxygen Prescription None     Intervention   Short Term Goals To learn and understand importance of monitoring SPO2 with pulse oximeter and demonstrate accurate use of the pulse oximeter.;To Learn and understand importance of maintaining oxygen saturations>88%;To learn and demonstrate proper purse lipped breathing techniques or other breathing techniques.  does not have any respiratory medications   Long  Term Goals Maintenance of O2 saturations>88%;Exhibits proper breathing techniques, such as purse lipped breathing or other method taught during program session;Verbalizes importance of monitoring SPO2 with pulse oximeter and return demonstration      Oxygen Re-Evaluation:   Oxygen Discharge (Final Oxygen Re-Evaluation):   Initial Exercise Prescription:     Initial Exercise Prescription - 10/09/16 1500      Date of Initial Exercise RX and Referring Provider   Date 10/09/16   Referring Provider [name] MD     Treadmill   MPH 2.2   Grade 0   Minutes 15   METs 2.68     NuStep   Level 2   SPM 80   Minutes 15   METs 2     REL-XR   Level 1   Speed 50   Minutes 15   METs 2     Prescription Details   Frequency (times per week) 3   Duration Progress to 45 minutes of aerobic exercise without signs/symptoms of physical distress     Intensity   THRR 40-80% of Max Heartrate 99-126   Ratings of Perceived Exertion 11-13   Perceived Dyspnea 0-4     Progression   Progression Continue to progress workloads to maintain intensity without signs/symptoms of physical distress.     Resistance Training   Training Prescription Yes   Weight 3 lbs   Reps 10-15      Perform Capillary Blood Glucose checks as needed.  Exercise Prescription Changes:     Exercise Prescription Changes    Row Name 10/09/16 1500             Response to Exercise   Blood Pressure (Admit) 146/64       Blood Pressure (Exercise) 166/64       Blood Pressure (Exit) 132/66       Heart Rate  (Admit) 73 bpm       Heart Rate (Exercise) 96 bpm       Heart Rate (Exit) 74 bpm       Oxygen Saturation (Admit) 96 %       Oxygen Saturation (Exercise) 96 %       Oxygen Saturation (Exit) 99 %       Rating of Perceived Exertion (Exercise) 12       Perceived Dyspnea (Exercise) 2       Symptoms SOB, Right sided chest pain 2/10       Comments walk test  results          Exercise Comments:   Exercise Goals and Review:     Exercise Goals    Row Name 10/09/16 1555             Exercise Goals   Increase Physical Activity Yes       Intervention Provide advice, education, support and counseling about physical activity/exercise needs.;Develop an individualized exercise prescription for aerobic and resistive training based on initial evaluation findings, risk stratification, comorbidities and participant's personal goals.       Expected Outcomes Achievement of increased cardiorespiratory fitness and enhanced flexibility, muscular endurance and strength shown through measurements of functional capacity and personal statement of participant.       Increase Strength and Stamina Yes       Intervention Provide advice, education, support and counseling about physical activity/exercise needs.;Develop an individualized exercise prescription for aerobic and resistive training based on initial evaluation findings, risk stratification, comorbidities and participant's personal goals.       Expected Outcomes Achievement of increased cardiorespiratory fitness and enhanced flexibility, muscular endurance and strength shown through measurements of functional capacity and personal statement of participant.          Exercise Goals Re-Evaluation :   Discharge Exercise Prescription (Final Exercise Prescription Changes):     Exercise Prescription Changes - 10/09/16 1500      Response to Exercise   Blood Pressure (Admit) 146/64   Blood Pressure (Exercise) 166/64   Blood Pressure (Exit) 132/66   Heart  Rate (Admit) 73 bpm   Heart Rate (Exercise) 96 bpm   Heart Rate (Exit) 74 bpm   Oxygen Saturation (Admit) 96 %   Oxygen Saturation (Exercise) 96 %   Oxygen Saturation (Exit) 99 %   Rating of Perceived Exertion (Exercise) 12   Perceived Dyspnea (Exercise) 2   Symptoms SOB, Right sided chest pain 2/10   Comments walk test results      Nutrition:  Target Goals: Understanding of nutrition guidelines, daily intake of sodium 1500mg , cholesterol 200mg , calories 30% from fat and 7% or less from saturated fats, daily to have 5 or more servings of fruits and vegetables.  Biometrics:     Pre Biometrics - 10/09/16 1556      Pre Biometrics   Height 5' 7.5" (1.715 m)   Weight 146 lb 12.8 oz (66.6 kg)   Waist Circumference 31 inches   Hip Circumference 36.5 inches   Waist to Hip Ratio 0.85 %   BMI (Calculated) 22.7       Nutrition Therapy Plan and Nutrition Goals:     Nutrition Therapy & Goals - 10/09/16 1445      Nutrition Therapy   RD appointment defered Yes     Intervention Plan   Intervention Prescribe, educate and counsel regarding individualized specific dietary modifications aiming towards targeted core components such as weight, hypertension, lipid management, diabetes, heart failure and other comorbidities.;Nutrition handout(s) given to patient.   Expected Outcomes Short Term Goal: Understand basic principles of dietary content, such as calories, fat, sodium, cholesterol and nutrients.;Short Term Goal: A plan has been developed with personal nutrition goals set during dietitian appointment.;Long Term Goal: Adherence to prescribed nutrition plan.      Nutrition Discharge: Rate Your Plate Scores:     Nutrition Assessments - 10/09/16 1547      MEDFICTS Scores   Pre Score 103      Nutrition Goals Re-Evaluation:   Nutrition Goals Discharge (Final Nutrition Goals Re-Evaluation):  Psychosocial: Target Goals: Acknowledge presence or absence of significant  depression and/or stress, maximize coping skills, provide positive support system. Participant is able to verbalize types and ability to use techniques and skills needed for reducing stress and depression.   Initial Review & Psychosocial Screening:   Quality of Life Scores:   PHQ-9: Recent Review Flowsheet Data    Depression screen [facility] 2/9 10/09/2016 10/07/2015   Decreased Interest 3 0   Down, Depressed, Hopeless 1 0   PHQ - 2 Score 4 0   Altered sleeping 1 -   Tired, decreased energy 3 -   Change in appetite 0 -   Feeling bad or failure about yourself  2 -   Trouble concentrating 1 -   Moving slowly or fidgety/restless 2 -   Suicidal thoughts 0 -   PHQ-9 Score 13 -   Difficult doing work/chores Extremely dIfficult -     Interpretation of Total Score  Total Score Depression Severity:  1-4 = Minimal depression, 5-9 = Mild depression, 10-14 = Moderate depression, 15-19 = Moderately severe depression, 20-27 = Severe depression   Psychosocial Evaluation and Intervention:   Psychosocial Re-Evaluation:   Psychosocial Discharge (Final Psychosocial Re-Evaluation):   Education: Education Goals: Education classes will be provided on a weekly basis, covering required topics. Participant will state understanding/return demonstration of topics presented.  Learning Barriers/Preferences:     Learning Barriers/Preferences - 10/09/16 1503      Learning Barriers/Preferences   Learning Barriers None   Learning Preferences None      Education Topics: Initial Evaluation Education: - Verbal, written and demonstration of respiratory meds, RPE/PD scales, oximetry and breathing techniques. Instruction on use of nebulizers and MDIs: cleaning and proper use, rinsing mouth with steroid doses and importance of monitoring MDI activations.   Pulmonary Rehab from 10/09/2016 in [facility] Cardiac and Pulmonary Rehab  Date  10/09/16  Educator  [facility]  Instruction Review Code  2- meets goals/outcomes       General Nutrition Guidelines/Fats and Fiber: -Group instruction provided by verbal, written material, models and posters to present the general guidelines for heart healthy nutrition. Gives an explanation and review of dietary fats and fiber.   Pulmonary Rehab from 10/09/2016 in [facility] Cardiac and Pulmonary Rehab  Date  10/09/16  Educator  [facility]  Instruction Review Code  2- meets goals/outcomes      Controlling Sodium/Reading Food Labels: -Group verbal and written material supporting the discussion of sodium use in heart healthy nutrition. Review and explanation with models, verbal and written materials for utilization of the food label.   Exercise Physiology & Risk Factors: - Group verbal and written instruction with models to review the exercise physiology of the cardiovascular system and associated critical values. Details cardiovascular disease risk factors and the goals associated with each risk factor.   Aerobic Exercise & Resistance Training: - Gives group verbal and written discussion on the health impact of inactivity. On the components of aerobic and resistive training programs and the benefits of this training and how to safely progress through these programs.   Flexibility, Balance, General Exercise Guidelines: - Provides group verbal and written instruction on the benefits of flexibility and balance training programs. Provides general exercise guidelines with specific guidelines to those with heart or lung disease. Demonstration and skill practice provided.   Stress Management: - Provides group verbal and written instruction about the health risks of elevated stress, cause of high stress, and healthy ways to reduce stress.   Depression: - Provides  group verbal and written instruction on the correlation between heart/lung disease and depressed mood, treatment options, and the stigmas associated with seeking treatment.   Exercise & Equipment Safety: - Individual verbal  instruction and demonstration of equipment use and safety with use of the equipment.   Pulmonary Rehab from 10/09/2016 in [facility] Cardiac and Pulmonary Rehab  Date  10/09/16  Educator  [facility]  Instruction Review Code  2- meets goals/outcomes      Infection Prevention: - Provides verbal and written material to individual with discussion of infection control including proper hand washing and proper equipment cleaning during exercise session.   Pulmonary Rehab from 10/09/2016 in [facility] Cardiac and Pulmonary Rehab  Date  10/09/16  Educator  [facility]  Instruction Review Code  2- meets goals/outcomes      Falls Prevention: - Provides verbal and written material to individual with discussion of falls prevention and safety.   Pulmonary Rehab from 10/09/2016 in [facility] Cardiac and Pulmonary Rehab  Date  10/09/16  Educator  [facility]  Instruction Review Code  2- meets goals/outcomes      Diabetes: - Individual verbal and written instruction to review signs/symptoms of diabetes, desired ranges of glucose level fasting, after meals and with exercise. Advice that pre and post exercise glucose checks will be done for 3 sessions at entry of program.   Chronic Lung Diseases: - Group verbal and written instruction to review new updates, new respiratory medications, new advancements in procedures and treatments. Provide informative websites and "800" numbers of self-education.   Lung Procedures: - Group verbal and written instruction to describe testing methods done to diagnose lung disease. Review the outcome of test results. Describe the treatment choices: Pulmonary Function Tests, ABGs and oximetry.   Energy Conservation: - Provide group verbal and written instruction for methods to conserve energy, plan and organize activities. Instruct on pacing techniques, use of adaptive equipment and posture/positioning to relieve shortness of breath.   Triggers: - Group verbal and written instruction to review types of  environmental controls: home humidity, furnaces, filters, dust mite/pet prevention, HEPA vacuums. To discuss weather changes, air quality and the benefits of nasal washing.   Exacerbations: - Group verbal and written instruction to provide: warning signs, infection symptoms, calling MD promptly, preventive modes, and value of vaccinations. Review: effective airway clearance, coughing and/or vibration techniques. Create an Sports administrator.   Oxygen: - Individual and group verbal and written instruction on oxygen therapy. Includes supplement oxygen, available portable oxygen systems, continuous and intermittent flow rates, oxygen safety, concentrators, and Medicare reimbursement for oxygen.   Respiratory Medications: - Group verbal and written instruction to review medications for lung disease. Drug class, frequency, complications, importance of spacers, rinsing mouth after steroid MDI's, and proper cleaning methods for nebulizers.   AED/CPR: - Group verbal and written instruction with the use of models to demonstrate the basic use of the AED with the basic ABC's of resuscitation.   Breathing Retraining: - Provides individuals verbal and written instruction on purpose, frequency, and proper technique of diaphragmatic breathing and pursed-lipped breathing. Applies individual practice skills.   Anatomy and Physiology of the Lungs: - Group verbal and written instruction with the use of models to provide basic lung anatomy and physiology related to function, structure and complications of lung disease.   Heart Failure: - Group verbal and written instruction on the basics of heart failure: signs/symptoms, treatments, explanation of ejection fraction, enlarged heart and cardiomyopathy.   Sleep Apnea: - Individual verbal and written instruction to review  Obstructive Sleep Apnea. Review of risk factors, methods for diagnosing and types of masks and machines for OSA.   Anxiety: - Provides group,  verbal and written instruction on the correlation between heart/lung disease and anxiety, treatment options, and management of anxiety.   Relaxation: - Provides group, verbal and written instruction about the benefits of relaxation for patients with heart/lung disease. Also provides patients with examples of relaxation techniques.   Knowledge Questionnaire Score:     Knowledge Questionnaire Score - 10/09/16 1445      Knowledge Questionnaire Score   Pre Score 5/10       Core Components/Risk Factors/Patient Goals at Admission:     Personal Goals and Risk Factors at Admission - 10/09/16 1453      Core Components/Risk Factors/Patient Goals on Admission   Improve shortness of breath with ADL's Yes   Intervention Provide education, individualized exercise plan and daily activity instruction to help decrease symptoms of SOB with activities of daily living.   Expected Outcomes Short Term: Achieves a reduction of symptoms when performing activities of daily living.   Develop more efficient breathing techniques such as purse lipped breathing and diaphragmatic breathing; and practicing self-pacing with activity Yes   Intervention Provide education, demonstration and support about specific breathing techniuqes utilized for more efficient breathing. Include techniques such as pursed lipped breathing, diaphragmatic breathing and self-pacing activity.   Expected Outcomes Short Term: Participant will be able to demonstrate and use breathing techniques as needed throughout daily activities.   Increase knowledge of respiratory medications and ability to use respiratory devices properly  Yes   Intervention Provide education and demonstration as needed of appropriate use of medications, inhalers, and oxygen therapy.   Expected Outcomes Short Term: Achieves understanding of medications use. Understands that oxygen is a medication prescribed by physician. Demonstrates appropriate use of inhaler and oxygen  therapy.   Heart Failure Yes   Intervention Provide a combined exercise and nutrition program that is supplemented with education, support and counseling about heart failure. Directed toward relieving symptoms such as shortness of breath, decreased exercise tolerance, and extremity edema.   Expected Outcomes Improve functional capacity of life;Short term: Attendance in program 2-3 days a week with increased exercise capacity. Reported lower sodium intake. Reported increased fruit and vegetable intake. Reports medication compliance.;Short term: Daily weights obtained and reported for increase. Utilizing diuretic protocols set by physician.;Long term: Adoption of self-care skills and reduction of barriers for early signs and symptoms recognition and intervention leading to self-care maintenance.   Lipids Yes   Intervention Provide education and support for participant on nutrition & aerobic/resistive exercise along with prescribed medications to achieve LDL 70mg , HDL >40mg .   Expected Outcomes Short Term: Participant states understanding of desired cholesterol values and is compliant with medications prescribed. Participant is following exercise prescription and nutrition guidelines.;Long Term: Cholesterol controlled with medications as prescribed, with individualized exercise RX and with personalized nutrition plan. Value goals: LDL < 70mg , HDL > 40 mg.   Stress Yes   Intervention Offer individual and/or small group education and counseling on adjustment to heart disease, stress management and health-related lifestyle change. Teach and support self-help strategies.;Refer participants experiencing significant psychosocial distress to appropriate mental health specialists for further evaluation and treatment. When possible, include family members and significant others in education/counseling sessions.   Expected Outcomes Short Term: Participant demonstrates changes in health-related behavior, relaxation and  other stress management skills, ability to obtain effective social support, and compliance with psychotropic medications if prescribed.;Long Term: Emotional  wellbeing is indicated by absence of clinically significant psychosocial distress or social isolation.      Core Components/Risk Factors/Patient Goals Review:    Core Components/Risk Factors/Patient Goals at Discharge (Final Review):    ITP Comments:     ITP Comments    Row Name 10/09/16 1555           ITP Comments Medical evaluation completed. Chart sent to Dr. Charm Barges director of [place] for review and changes.          Comments: Initial ITP

## 2016-10-09 NOTE — Patient Instructions (Signed)
Patient Instructions  Patient Details  Name: Sierra Kaiser MRN: [id number] Date of Birth: 12/24/1933 Referring Provider:  [name], MD  Below are the personal goals you chose as well as exercise and nutrition goals. Our goal is to help you keep on track towards obtaining and maintaining your goals. We will be discussing your progress on these goals with you throughout the program.  Initial Exercise Prescription:     Initial Exercise Prescription - 10/09/16 1500      Date of Initial Exercise RX and Referring Provider   Date 10/09/16   Referring Provider [name] MD     Treadmill   MPH 2.2   Grade 0   Minutes 15   METs 2.68     NuStep   Level 2   SPM 80   Minutes 15   METs 2     REL-XR   Level 1   Speed 50   Minutes 15   METs 2     Prescription Details   Frequency (times per week) 3   Duration Progress to 45 minutes of aerobic exercise without signs/symptoms of physical distress     Intensity   THRR 40-80% of Max Heartrate 99-126   Ratings of Perceived Exertion 11-13   Perceived Dyspnea 0-4     Progression   Progression Continue to progress workloads to maintain intensity without signs/symptoms of physical distress.     Resistance Training   Training Prescription Yes   Weight 3 lbs   Reps 10-15      Exercise Goals: Frequency: Be able to perform aerobic exercise three times per week working toward 3-5 days per week.  Intensity: Work with a perceived exertion of 11 (fairly light) - 15 (hard) as tolerated. Follow your new exercise prescription and watch for changes in prescription as you progress with the program. Changes will be reviewed with you when they are made.  Duration: You should be able to do 30 minutes of continuous aerobic exercise in addition to a 5 minute warm-up and a 5 minute cool-down routine.  Nutrition Goals: Your personal nutrition goals will be established when you do your nutrition analysis with the dietician.  The following  are nutrition guidelines to follow: Cholesterol < 200mg /day Sodium < 1500mg /day Fiber: Women over 50 yrs - 21 grams per day  Personal Goals:     Personal Goals and Risk Factors at Admission - 10/09/16 1453      Core Components/Risk Factors/Patient Goals on Admission   Improve shortness of breath with ADL's Yes   Intervention Provide education, individualized exercise plan and daily activity instruction to help decrease symptoms of SOB with activities of daily living.   Expected Outcomes Short Term: Achieves a reduction of symptoms when performing activities of daily living.   Develop more efficient breathing techniques such as purse lipped breathing and diaphragmatic breathing; and practicing self-pacing with activity Yes   Intervention Provide education, demonstration and support about specific breathing techniuqes utilized for more efficient breathing. Include techniques such as pursed lipped breathing, diaphragmatic breathing and self-pacing activity.   Expected Outcomes Short Term: Participant will be able to demonstrate and use breathing techniques as needed throughout daily activities.   Increase knowledge of respiratory medications and ability to use respiratory devices properly  Yes   Intervention Provide education and demonstration as needed of appropriate use of medications, inhalers, and oxygen therapy.   Expected Outcomes Short Term: Achieves understanding of medications use. Understands that oxygen is a medication prescribed by physician.  Demonstrates appropriate use of inhaler and oxygen therapy.   Heart Failure Yes   Intervention Provide a combined exercise and nutrition program that is supplemented with education, support and counseling about heart failure. Directed toward relieving symptoms such as shortness of breath, decreased exercise tolerance, and extremity edema.   Expected Outcomes Improve functional capacity of life;Short term: Attendance in program 2-3 days a week with  increased exercise capacity. Reported lower sodium intake. Reported increased fruit and vegetable intake. Reports medication compliance.;Short term: Daily weights obtained and reported for increase. Utilizing diuretic protocols set by physician.;Long term: Adoption of self-care skills and reduction of barriers for early signs and symptoms recognition and intervention leading to self-care maintenance.   Lipids Yes   Intervention Provide education and support for participant on nutrition & aerobic/resistive exercise along with prescribed medications to achieve LDL 70mg , HDL >40mg .   Expected Outcomes Short Term: Participant states understanding of desired cholesterol values and is compliant with medications prescribed. Participant is following exercise prescription and nutrition guidelines.;Long Term: Cholesterol controlled with medications as prescribed, with individualized exercise RX and with personalized nutrition plan. Value goals: LDL < 70mg , HDL > 40 mg.   Stress Yes   Intervention Offer individual and/or small group education and counseling on adjustment to heart disease, stress management and health-related lifestyle change. Teach and support self-help strategies.;Refer participants experiencing significant psychosocial distress to appropriate mental health specialists for further evaluation and treatment. When possible, include family members and significant others in education/counseling sessions.   Expected Outcomes Short Term: Participant demonstrates changes in health-related behavior, relaxation and other stress management skills, ability to obtain effective social support, and compliance with psychotropic medications if prescribed.;Long Term: Emotional wellbeing is indicated by absence of clinically significant psychosocial distress or social isolation.      Tobacco Use Initial Evaluation: History  Smoking Status  . Never Smoker  Smokeless Tobacco  . Never Used    Copy of goals given  to participant.

## 2016-10-11 ENCOUNTER — Encounter: Payer: Medicare Other | Admitting: *Deleted

## 2016-10-11 DIAGNOSIS — I509 Heart failure, unspecified: Secondary | ICD-10-CM | POA: Diagnosis not present

## 2016-10-11 DIAGNOSIS — I5032 Chronic diastolic (congestive) heart failure: Secondary | ICD-10-CM

## 2016-10-11 NOTE — Progress Notes (Signed)
Daily Session Note  Patient Details  Name: Sierra Kaiser MRN: [id number] Date of Birth: 1933/06/28 Referring Provider:     Pulmonary Rehab from 10/09/2016 in [facility] Cardiac and Pulmonary Rehab  Referring Provider  [name] MD      Encounter Date: 10/11/2016  Check In:     Session Check In - 10/11/16 1140      Check-In   Location [facility]-Cardiac & Pulmonary Rehab   Staff Present Heath Lark, RN, BSN, CCRP;Meredith Sherryll Burger, RN BSN;Joseph Flavia Shipper   Supervising physician immediately available to respond to emergencies [facility] immediately available ER MD   Physician(s) Dr. Jimmye Norman and Archie Balboa   Medication changes reported     No   Fall or balance concerns reported    No   Warm-up and Cool-down Performed as group-led instruction   Resistance Training Performed Yes   VAD Patient? No     Pain Assessment   Currently in Pain? No/denies         History  Smoking Status  . Never Smoker  Smokeless Tobacco  . Never Used    Goals Met:  Proper associated with RPD/PD & O2 Sat Independence with exercise equipment Using PLB without cueing & demonstrates good technique Exercise tolerated well Strength training completed today  Goals Unmet:  Not Applicable  Comments: First full day of exercise!  Patient was oriented to gym and equipment including functions, settings, policies, and procedures.  Patient's individual exercise prescription and treatment plan were reviewed.  All starting workloads were established based on the results of the 6 minute walk test done at initial orientation visit.  The plan for exercise progression was also introduced and progression will be customized based on patient's performance and goals.    Dr. Emily Filbert is Medical Director for [facility] and [facility].

## 2016-10-12 ENCOUNTER — Ambulatory Visit
Admission: RE | Admit: 2016-10-12 | Discharge: 2016-10-12 | Disposition: A | Discharge status: Home / Self Care | Payer: Medicare Other | Source: Ambulatory Visit | Attending: Otolaryngology | Admitting: Otolaryngology

## 2016-10-12 ENCOUNTER — Telehealth: Payer: Self-pay | Admitting: *Deleted

## 2016-10-12 DIAGNOSIS — R42 Dizziness and giddiness: Secondary | ICD-10-CM | POA: Diagnosis present

## 2016-10-12 DIAGNOSIS — H748X3 Other specified disorders of middle ear and mastoid, bilateral: Secondary | ICD-10-CM | POA: Diagnosis not present

## 2016-10-12 NOTE — Telephone Encounter (Signed)
-----   Message from Sierra Filbert, RN sent at 08/18/2016  9:32 AM EDT ----- Patient needs a repeat BMP in 2 months. Call to schedule

## 2016-10-12 NOTE — Telephone Encounter (Signed)
I left a message for the patient to call. 

## 2016-10-13 DIAGNOSIS — I509 Heart failure, unspecified: Secondary | ICD-10-CM | POA: Diagnosis not present

## 2016-10-13 DIAGNOSIS — I5032 Chronic diastolic (congestive) heart failure: Secondary | ICD-10-CM

## 2016-10-13 NOTE — Telephone Encounter (Signed)
I called and spoke with the patient's spouse- she is not home at this time, but will be back soon.  I advised I will call back later.

## 2016-10-13 NOTE — Telephone Encounter (Signed)
Pt called and asks can she have labwork before 2 months. Please call and advise.

## 2016-10-13 NOTE — Progress Notes (Signed)
Daily Session Note  Patient Details  Name: AMAYRA KIEDROWSKI MRN: [id number] Date of Birth: June 22, 1933 Referring Provider:     Pulmonary Rehab from 10/09/2016 in [facility] Cardiac and Pulmonary Rehab  Referring Provider  [name] MD      Encounter Date: 10/13/2016  Check In:     Session Check In - 10/13/16 1124      Check-In   Location [facility]-Cardiac & Pulmonary Rehab   Staff Present Alberteen Sam, MA, ACSM RCEP, Exercise Physiologist;Meredith Sherryll Burger, RN BSN;Zayin Valadez Flavia Shipper   Supervising physician immediately available to respond to emergencies [facility] immediately available ER MD   Physician(s) Drs. Quale and Kinner   Medication changes reported     No   Fall or balance concerns reported    No   Warm-up and Cool-down Performed as group-led Location manager Performed Yes   VAD Patient? No     Pain Assessment   Currently in Pain? No/denies   Multiple Pain Sites No         History  Smoking Status  . Never Smoker  Smokeless Tobacco  . Never Used    Goals Met:  Proper associated with RPD/PD & O2 Sat Exercise tolerated well No report of cardiac concerns or symptoms Strength training completed today  Goals Unmet:  Not Applicable  Comments: Pt able to follow exercise prescription today without complaint.  Will continue to monitor for progression.   Dr. Emily Filbert is Medical Director for [facility] and [facility].

## 2016-10-16 DIAGNOSIS — I5032 Chronic diastolic (congestive) heart failure: Secondary | ICD-10-CM

## 2016-10-16 DIAGNOSIS — I509 Heart failure, unspecified: Secondary | ICD-10-CM | POA: Diagnosis not present

## 2016-10-16 NOTE — Progress Notes (Signed)
Daily Session Note  Patient Details  Name: Sierra Kaiser MRN: [id number] Date of Birth: 04/25/1933 Referring Provider:     Pulmonary Rehab from 10/09/2016 in [facility] Cardiac and Pulmonary Rehab  Referring Provider  Wang, Andrew MD      Encounter Date: 10/16/2016  Check In:     Session Check In - 10/16/16 1207      Check-In   Location [facility]-Cardiac & Pulmonary Rehab   Staff Present Amanda Sommer, BA, ACSM CEP, Exercise Physiologist;Kelly Hayes, BS, ACSM CEP, Exercise Physiologist;  RCP,RRT,BSRT   Supervising physician immediately available to respond to emergencies [facility] immediately available ER MD   Physician(s) Dr. saideeki and Veronese   Medication changes reported     No   Fall or balance concerns reported    No   Warm-up and Cool-down Performed as group-led instruction   Resistance Training Performed Yes   VAD Patient? No     Pain Assessment   Currently in Pain? No/denies   Multiple Pain Sites No         History  Smoking Status  . Never Smoker  Smokeless Tobacco  . Never Used    Goals Met:  Proper associated with RPD/PD & O2 Sat Exercise tolerated well No report of cardiac concerns or symptoms Strength training completed today  Goals Unmet:  Not Applicable  Comments: Pt able to follow exercise prescription today without complaint.  Will continue to monitor for progression. Patient has slight episode of dizziness and has already talked to her ear doctor.   Dr. Mark Miller is Medical Director for [facility] and [facility]. 

## 2016-10-17 ENCOUNTER — Other Ambulatory Visit: Payer: Self-pay

## 2016-10-17 DIAGNOSIS — Z79899 Other long term (current) drug therapy: Secondary | ICD-10-CM

## 2016-10-17 NOTE — Telephone Encounter (Signed)
Late entry- Tay, CMA called and spoke with the patient this morning about her repeat lab. She will have this done- on 10/19/16 in the [place] office.

## 2016-10-18 ENCOUNTER — Encounter: Payer: Medicare Other | Admitting: *Deleted

## 2016-10-18 DIAGNOSIS — I509 Heart failure, unspecified: Secondary | ICD-10-CM | POA: Diagnosis not present

## 2016-10-18 DIAGNOSIS — I5032 Chronic diastolic (congestive) heart failure: Secondary | ICD-10-CM

## 2016-10-18 NOTE — Progress Notes (Signed)
Daily Session Note  Patient Details  Name: Sierra Kaiser MRN: [id number] Date of Birth: [birth year] Referring Provider:     Pulmonary Rehab from 10/09/2016 in [facility] Cardiac and Pulmonary Rehab  Referring Provider  [name] MD      Encounter Date: 10/18/2016  Check In:     Session Check In - 10/18/16 1127      Check-In   Location [facility]-Cardiac & Pulmonary Rehab   Staff Present Renita Papa, RN BSN;Joseph Darrin Nipper, [place], ACSM RCEP, Exercise Physiologist   Supervising physician immediately available to respond to emergencies [facility] immediately available ER MD   Physician(s) Short: Become more profiecient at using PLB.   Long: Become independent at using PLB.   Medication changes reported     No   Fall or balance concerns reported    No   Resistance Training Performed Yes   VAD Patient? No     Pain Assessment   Currently in Pain? No/denies           Exercise Prescription Changes - 10/17/16 1500      Response to Exercise   Blood Pressure (Admit) 102/62   Blood Pressure (Exercise) 146/50   Blood Pressure (Exit) 130/62   Heart Rate (Admit) 76 bpm   Heart Rate (Exercise) 77 bpm   Heart Rate (Exit) 74 bpm   Oxygen Saturation (Admit) 96 %   Oxygen Saturation (Exercise) 99 %   Oxygen Saturation (Exit) 99 %   Rating of Perceived Exertion (Exercise) 15   Perceived Dyspnea (Exercise) 3   Duration Continue with 45 min of aerobic exercise without signs/symptoms of physical distress.   Intensity THRR unchanged     Progression   Progression Continue to progress workloads to maintain intensity without signs/symptoms of physical distress.   Average METs 2.16     Resistance Training   Training Prescription Yes   Weight 3 lbs   Reps 10-15     Interval Training   Interval Training No     Treadmill   MPH 2.2   Grade 0   Minutes 15   METs 2.68     NuStep   Level 2   SPM 76   Minutes 15   METs 2.1     REL-XR   Level 1   Speed 50   Minutes 15   METs 1.7      History  Smoking Status  . Never Smoker  Smokeless Tobacco  . Never Used    Goals Met:  Proper associated with RPD/PD & O2 Sat Independence with exercise equipment Exercise tolerated well Strength training completed today  Goals Unmet:  Not Applicable  Comments: Pt able to follow exercise prescription today without complaint.  Will continue to monitor for progression.    Dr. Emily Filbert is Medical Director for [facility] and [facility].

## 2016-10-19 ENCOUNTER — Other Ambulatory Visit (INDEPENDENT_AMBULATORY_CARE_PROVIDER_SITE_OTHER): Payer: Medicare Other

## 2016-10-19 DIAGNOSIS — Z79899 Other long term (current) drug therapy: Secondary | ICD-10-CM | POA: Diagnosis not present

## 2016-10-20 ENCOUNTER — Encounter: Payer: Medicare Other | Admitting: *Deleted

## 2016-10-20 DIAGNOSIS — I5032 Chronic diastolic (congestive) heart failure: Secondary | ICD-10-CM

## 2016-10-20 DIAGNOSIS — I509 Heart failure, unspecified: Secondary | ICD-10-CM | POA: Diagnosis not present

## 2016-10-20 LAB — BASIC METABOLIC PANEL
BUN/Creatinine Ratio: 19 (ref 12–28)
BUN: 23 mg/dL (ref 8–27)
CO2: 24 mmol/L (ref 20–29)
Calcium: 9.7 mg/dL (ref 8.7–10.3)
Chloride: 107 mmol/L — ABNORMAL HIGH (ref 96–106)
Creatinine, Ser: 1.24 mg/dL — ABNORMAL HIGH (ref 0.57–1.00)
GFR calc Af Amer: 47 mL/min/{1.73_m2} — ABNORMAL LOW (ref >59)
GFR calc non Af Amer: 41 mL/min/{1.73_m2} — ABNORMAL LOW (ref >59)
Glucose: 107 mg/dL — ABNORMAL HIGH (ref 65–99)
Potassium: 5.4 mmol/L — ABNORMAL HIGH (ref 3.5–5.2)
Sodium: 144 mmol/L (ref 134–144)

## 2016-10-20 NOTE — Progress Notes (Signed)
Daily Session Note  Patient Details  Name: Sierra Kaiser MRN: [id number] Date of Birth: 10-08-1933 Referring Provider:     Pulmonary Rehab from 10/09/2016 in [facility] Cardiac and Pulmonary Rehab  Referring Provider  [name] MD      Encounter Date: 10/20/2016  Check In:     Session Check In - 10/20/16 1147      Check-In   Location [facility]-Cardiac & Pulmonary Rehab   Staff Present Justin Mend Naida Sleight, RN BSN;Tywanda Rice Sherryll Burger, RN BSN   Supervising physician immediately available to respond to emergencies [facility] immediately available ER MD   Physician(s) Dr. Jimmye Norman and Quentin Cornwall   Medication changes reported     No   Fall or balance concerns reported    No   Warm-up and Cool-down Performed as group-led instruction   Resistance Training Performed Yes   VAD Patient? No     Pain Assessment   Currently in Pain? No/denies         History  Smoking Status  . Never Smoker  Smokeless Tobacco  . Never Used    Goals Met:  Proper associated with RPD/PD & O2 Sat Independence with exercise equipment Using PLB without cueing & demonstrates good technique Exercise tolerated well Strength training completed today  Goals Unmet:  Not Applicable  Comments: Pt able to follow exercise prescription today without complaint.  Will continue to monitor for progression.    Dr. Emily Filbert is Medical Director for [facility] and [facility].

## 2016-10-23 DIAGNOSIS — I509 Heart failure, unspecified: Secondary | ICD-10-CM | POA: Diagnosis not present

## 2016-10-23 DIAGNOSIS — I5032 Chronic diastolic (congestive) heart failure: Secondary | ICD-10-CM

## 2016-10-23 NOTE — Progress Notes (Signed)
Daily Session Note  Patient Details  Name: EARLY ORD MRN: [id number] Date of Birth: 1933/12/08 Referring Provider:     Pulmonary Rehab from 10/09/2016 in [facility] Cardiac and Pulmonary Rehab  Referring Provider  [name] MD      Encounter Date: 10/23/2016  Check In:     Session Check In - 10/23/16 1136      Check-In   Location [facility]-Cardiac & Pulmonary Rehab   Staff Present Earlean Shawl, BS, ACSM CEP, Exercise Physiologist;Blannie Shedlock Clayborn Bigness, BS, RRT, Respiratory Therapist   Supervising physician immediately available to respond to emergencies [facility] immediately available ER MD   Physician(s) Dr. Mable Paris and Jimmye Norman   Medication changes reported     No   Fall or balance concerns reported    No   Warm-up and Cool-down Performed as group-led instruction   Resistance Training Performed Yes   VAD Patient? No     Pain Assessment   Currently in Pain? No/denies   Multiple Pain Sites No         History  Smoking Status  . Never Smoker  Smokeless Tobacco  . Never Used    Goals Met:  Proper associated with RPD/PD & O2 Sat Exercise tolerated well No report of cardiac concerns or symptoms Strength training completed today  Goals Unmet:  Not Applicable  Comments: Pt able to follow exercise prescription today without complaint.  Will continue to monitor for progression.   Dr. Emily Filbert is Medical Director for [facility] and [facility].

## 2016-10-24 ENCOUNTER — Other Ambulatory Visit: Payer: Medicare Other

## 2016-10-24 ENCOUNTER — Telehealth: Payer: Self-pay | Admitting: *Deleted

## 2016-10-24 DIAGNOSIS — I5032 Chronic diastolic (congestive) heart failure: Secondary | ICD-10-CM

## 2016-10-24 DIAGNOSIS — I4821 Permanent atrial fibrillation: Secondary | ICD-10-CM

## 2016-10-24 NOTE — Telephone Encounter (Signed)
Left voicemail message for patient to call back.

## 2016-10-24 NOTE — Telephone Encounter (Signed)
-----   Message from [name], MD sent at 10/21/2016 11:38 AM EDT ----- Please Inform Patient that labs are stable, ie renal function K is elevated not sure why  Will need recheck next week  Thanks

## 2016-10-24 NOTE — Telephone Encounter (Signed)
Please call patient to discuss if medications should be changed

## 2016-10-24 NOTE — Telephone Encounter (Signed)
Patient informed and verbalized understanding of plan. Patient was placed on spironolactone 12.5 mg daily by Dr. [name] ([place]) on 09/07/16.  Scheduled to have repeat BMET today @2 :00 pm.

## 2016-10-25 NOTE — Telephone Encounter (Signed)
Patient states that she has had numerous appointments and was confused about results. She wants to clear up a few things. She states that Dr. [name] at [facility] placed her on new medication and was going to reach out and talk with Dr. [name]. She reports that there was some discussion regarding a new lead placement and she has not heard anything regarding this and just wanted to reach out. Confirmed her upcoming repeat lab work and will route message to [facility] Dr. Olin Pia nurse regarding lead placement. She verbalized understanding of our conversation, agreement with plan, and had no further questions at this time.

## 2016-10-25 NOTE — Telephone Encounter (Signed)
I called and spoke with the patient- I advised her I was unaware of her needing a new lead, however, I do see this in Dr. Leland Her note. Per the patient, Dr. [name] and Dr. [name] were to have spoken but she was unsure if they did. I advised her I was also unsure, but will follow up with Dr. [name] regarding the lead issue when he is back in the office on 11/03/16. She is agreeable.

## 2016-10-27 DIAGNOSIS — I509 Heart failure, unspecified: Secondary | ICD-10-CM | POA: Diagnosis not present

## 2016-10-27 DIAGNOSIS — I5032 Chronic diastolic (congestive) heart failure: Secondary | ICD-10-CM

## 2016-10-27 NOTE — Progress Notes (Signed)
Daily Session Note  Patient Details  Name: LATRISE BOWLAND MRN: [id number] Date of Birth: 02-18-1934 Referring Provider:     Pulmonary Rehab from 10/09/2016 in [facility] Cardiac and Pulmonary Rehab  Referring Provider  [name] MD      Encounter Date: 10/27/2016  Check In:     Session Check In - 10/27/16 1148      Check-In   Location [facility]-Cardiac & Pulmonary Rehab   Staff Present Renita Papa, RN BSN;Tao Satz Darrin Nipper, [place], ACSM RCEP, Exercise Physiologist   Supervising physician immediately available to respond to emergencies [facility] immediately available ER MD   Physician(s) Dr. Burlene Arnt and Alfred Levins   Medication changes reported     No   Fall or balance concerns reported    No   Warm-up and Cool-down Performed as group-led instruction   Resistance Training Performed Yes   VAD Patient? No     Pain Assessment   Currently in Pain? No/denies   Multiple Pain Sites No         History  Smoking Status  . Never Smoker  Smokeless Tobacco  . Never Used    Goals Met:  Proper associated with RPD/PD & O2 Sat Independence with exercise equipment Exercise tolerated well No report of cardiac concerns or symptoms Strength training completed today  Goals Unmet:  Not Applicable  Comments: Pt able to follow exercise prescription today without complaint.  Will continue to monitor for progression. Reviewed home exercise with pt today.  Pt plans to by walking at home for exercise.  Reviewed THR, pulse, RPE, sign and symptoms, NTG use, and when to call 911 or MD.  Also discussed weather considerations and indoor options.  Pt voiced understanding.   Dr. Emily Filbert is Medical Director for [facility] and [facility].

## 2016-10-30 ENCOUNTER — Other Ambulatory Visit: Payer: Medicare Other

## 2016-10-30 DIAGNOSIS — I509 Heart failure, unspecified: Secondary | ICD-10-CM | POA: Diagnosis not present

## 2016-10-30 DIAGNOSIS — I5032 Chronic diastolic (congestive) heart failure: Secondary | ICD-10-CM

## 2016-10-30 DIAGNOSIS — I4821 Permanent atrial fibrillation: Secondary | ICD-10-CM

## 2016-10-30 NOTE — Progress Notes (Signed)
Pulmonary Individual Treatment Plan  Patient Details  Name: Sierra Kaiser MRN: [id number] Date of Birth: 06-Aug-1933 Referring Provider:     Pulmonary Rehab from 10/09/2016 in [facility] Cardiac and Pulmonary Rehab  Referring Provider  [name] MD      Initial Encounter Date:    Pulmonary Rehab from 10/09/2016 in [facility] Cardiac and Pulmonary Rehab  Date  10/09/16  Referring Provider  [name] MD      Visit Diagnosis: Chronic diastolic congestive heart failure ([place])  Patient's Home Medications on Admission:  Current Outpatient Prescriptions:  .  Biotin 5000 MCG TABS, Take 5,000 mcg by mouth daily., Disp: , Rfl:  .  diphenhydrAMINE-zinc acetate (BENADRYL) cream, Apply 1 application topically 2 (two) times daily as needed for itching., Disp: , Rfl:  .  fluticasone (FLONASE) 50 MCG/ACT nasal spray, Place 2 sprays into both nostrils daily., Disp: 16 g, Rfl: 3 .  furosemide (LASIX) 40 MG tablet, Take 1 tablet (40 mg total) by mouth daily as needed., Disp: , Rfl:  .  omeprazole (PRILOSEC) 40 MG capsule, Take 1 capsule (40 mg total) by mouth daily., Disp: 90 capsule, Rfl: 3 .  Polyethyl Glycol-Propyl Glycol (SYSTANE OP), Place 1 drop into both eyes daily as needed (dry eyes)., Disp: , Rfl:  .  Rivaroxaban (XARELTO) 15 MG TABS tablet, Take 1 tablet (15 mg total) by mouth daily with supper., Disp: 90 tablet, Rfl: 3 .  rosuvastatin (CRESTOR) 20 MG tablet, Take 1 tablet (20 mg total) by mouth daily., Disp: 90 tablet, Rfl: 3 .  vitamin B-12 (CYANOCOBALAMIN) 1000 MCG tablet, Take 1,000 mcg by mouth daily., Disp: , Rfl:   Past Medical History: Past Medical History:  Diagnosis Date  . Anxiety   . CAD (coronary artery disease)   . CKD stage 3 secondary to diabetes ([place])   . Esophageal reflux   . GI bleeding 2012   during EGD necessitating open surgery  . Hiatal hernia   . History of blood transfusion   . History of diverticulitis   . History of UTI   . HTN (hypertension)   . Hyperlipidemia    . Meniere's disease   . Mitral regurgitation   . Osteopenia 12/11/2015   T score -1.2 femur, -2.0 spine (12/2015)  . Permanent atrial fibrillation ([place])    a. permanent b. s/p PVI RFA at [facility] 10/14 c. failed amio (neuro toxicity) and Tikosyn d. 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 PUD (peptic ulcer disease)   . Raynaud's disease   . SNHL (sensorineural hearing loss)    right ear  . Syncopal episodes   . Tinnitus   . Ulcerative colitis ([place])    per prior records  . Urinary incontinence, urge     Tobacco Use: History  Smoking Status  . Never Smoker  Smokeless Tobacco  . Never Used    Labs: Recent Review Flowsheet Data    Labs for ITP Cardiac and Pulmonary Rehab Latest Ref Rng & Units 03/26/2014 10/07/2015 08/15/2016   Cholestrol 100 - 199 mg/dL 293(A) 203(H) 159   LDLCALC 0 - 99 mg/dL 206 136(H) 73   HDL >39 mg/dL 45 42.50 57   Trlycerides 0 - 149 mg/dL 210(A) 125.0 143       Pulmonary Assessment Scores:     Pulmonary Assessment Scores    Row Name 10/09/16 1446         ADL UCSD   ADL Phase Entry  SOB Score total 63     Rest 0     Walk 2     Stairs 4     Bath 4     Dress 3     Shop 3       CAT Score   CAT Score 22       mMRC Score   mMRC Score 2  Simultaneous filing. User may not have seen previous data.        Pulmonary Function Assessment:     Pulmonary Function Assessment - 10/09/16 1559      Breath   Bilateral Breath Sounds Clear   Shortness of Breath Fear of Shortness of Breath      Exercise Target Goals:    Exercise Program Goal: Individual exercise prescription set with THRR, safety & activity barriers. Participant demonstrates ability to understand and report RPE using BORG scale, to self-measure pulse accurately, and to acknowledge the importance of the exercise prescription.  Exercise Prescription Goal: Starting with aerobic activity 30 plus minutes a day, 3 days per week for initial  exercise prescription. Provide home exercise prescription and guidelines that participant acknowledges understanding prior to discharge.  Activity Barriers & Risk Stratification:     Activity Barriers & Cardiac Risk Stratification - 10/09/16 1553      Activity Barriers & Cardiac Risk Stratification   Activity Barriers Deconditioning;Muscular Weakness;Shortness of Breath      6 Minute Walk:     6 Minute Walk    Row Name 10/09/16 1551         6 Minute Walk   Phase Initial     Distance 1250 feet     Walk Time 6 minutes     # of Rest Breaks 0     MPH 2.37     METS 2.72     RPE 12     Perceived Dyspnea  2     VO2 Peak 9.5     Symptoms Yes (comment)     Comments SOB,  Right sided chest pain 2/10     Resting HR 73 bpm     Resting BP 146/64     Max Ex. HR 96 bpm     Max Ex. BP 166/64     2 Minute Post BP 132/66       Interval HR   Baseline HR (retired) 73     1 Minute HR 94     2 Minute HR 96     3 Minute HR 95     4 Minute HR 94     5 Minute HR 94     6 Minute HR 94     2 Minute Post HR 74     Interval Heart Rate? Yes       Interval Oxygen   Interval Oxygen? Yes     Baseline Oxygen Saturation % 96 %     Resting Liters of Oxygen 0 L  Room Air     1 Minute Oxygen Saturation % 97 %     1 Minute Liters of Oxygen 0 L     2 Minute Oxygen Saturation % 96 %     2 Minute Liters of Oxygen 0 L     3 Minute Oxygen Saturation % 97 %     3 Minute Liters of Oxygen 0 L     4 Minute Oxygen Saturation % 97 %     4 Minute Liters of Oxygen 0 L  5 Minute Oxygen Saturation % 97 %     5 Minute Liters of Oxygen 0 L     6 Minute Oxygen Saturation % 97 %     6 Minute Liters of Oxygen 0 L     2 Minute Post Oxygen Saturation % 99 %     2 Minute Post Liters of Oxygen 0 L       Oxygen Initial Assessment:     Oxygen Initial Assessment - 10/09/16 1450      Home Oxygen   Home Oxygen Device None   Sleep Oxygen Prescription None   Home Exercise Oxygen Prescription None   Home  at Rest Exercise Oxygen Prescription None     Intervention   Short Term Goals To learn and understand importance of monitoring SPO2 with pulse oximeter and demonstrate accurate use of the pulse oximeter.;To Learn and understand importance of maintaining oxygen saturations>88%;To learn and demonstrate proper purse lipped breathing techniques or other breathing techniques.  does not have any respiratory medications   Long  Term Goals Maintenance of O2 saturations>88%;Exhibits proper breathing techniques, such as purse lipped breathing or other method taught during program session;Verbalizes importance of monitoring SPO2 with pulse oximeter and return demonstration      Oxygen Re-Evaluation:   Oxygen Discharge (Final Oxygen Re-Evaluation):   Initial Exercise Prescription:     Initial Exercise Prescription - 10/09/16 1500      Date of Initial Exercise RX and Referring Provider   Date 10/09/16   Referring Provider [name] MD     Treadmill   MPH 2.2   Grade 0   Minutes 15   METs 2.68     NuStep   Level 2   SPM 80   Minutes 15   METs 2     REL-XR   Level 1   Speed 50   Minutes 15   METs 2     Prescription Details   Frequency (times per week) 3   Duration Progress to 45 minutes of aerobic exercise without signs/symptoms of physical distress     Intensity   THRR 40-80% of Max Heartrate 99-126   Ratings of Perceived Exertion 11-13   Perceived Dyspnea 0-4     Progression   Progression Continue to progress workloads to maintain intensity without signs/symptoms of physical distress.     Resistance Training   Training Prescription Yes   Weight 3 lbs   Reps 10-15      Perform Capillary Blood Glucose checks as needed.  Exercise Prescription Changes:     Exercise Prescription Changes    Row Name 10/09/16 1500 10/17/16 1500 10/27/16 1300         Response to Exercise   Blood Pressure (Admit) 146/64 102/62  -     Blood Pressure (Exercise) 166/64 146/50  -      Blood Pressure (Exit) 132/66 130/62  -     Heart Rate (Admit) 73 bpm 76 bpm  -     Heart Rate (Exercise) 96 bpm 77 bpm  -     Heart Rate (Exit) 74 bpm 74 bpm  -     Oxygen Saturation (Admit) 96 % 96 %  -     Oxygen Saturation (Exercise) 96 % 99 %  -     Oxygen Saturation (Exit) 99 % 99 %  -     Rating of Perceived Exertion (Exercise) 12 15  -     Perceived Dyspnea (Exercise) 2 3  -  Symptoms SOB, Right sided chest pain 2/10  -  -     Comments walk test results  -  -     Duration  - Continue with 45 min of aerobic exercise without signs/symptoms of physical distress.  -     Intensity  - THRR unchanged  -       Progression   Progression  - Continue to progress workloads to maintain intensity without signs/symptoms of physical distress.  -     Average METs  - 2.16  -       Resistance Training   Training Prescription  - Yes  -     Weight  - 3 lbs  -     Reps  - 10-15  -       Interval Training   Interval Training  - No  -       Treadmill   MPH  - 2.2  -     Grade  - 0  -     Minutes  - 15  -     METs  - 2.68  -       NuStep   Level  - 2  -     SPM  - 76  -     Minutes  - 15  -     METs  - 2.1  -       REL-XR   Level  - 1  -     Speed  - 50  -     Minutes  - 15  -     METs  - 1.7  -       Home Exercise Plan   Plans to continue exercise at  -  - Home (comment)  walking at home     Frequency  -  - Add 1 additional day to program exercise sessions.     Initial Home Exercises Provided  -  - 10/27/16        Exercise Comments:     Exercise Comments    Row Name 10/11/16 1244           Exercise Comments First full day of exercise!  Patient was oriented to gym and equipment including functions, settings, policies, and procedures.  Patient's individual exercise prescription and treatment plan were reviewed.  All starting workloads were established based on the results of the 6 minute walk test done at initial orientation visit.  The plan for exercise progression  was also introduced and progression will be customized based on patient's performance and goals.          Exercise Goals and Review:     Exercise Goals    Row Name 10/09/16 1555             Exercise Goals   Increase Physical Activity Yes       Intervention Provide advice, education, support and counseling about physical activity/exercise needs.;Develop an individualized exercise prescription for aerobic and resistive training based on initial evaluation findings, risk stratification, comorbidities and participant's personal goals.       Expected Outcomes Achievement of increased cardiorespiratory fitness and enhanced flexibility, muscular endurance and strength shown through measurements of functional capacity and personal statement of participant.       Increase Strength and Stamina Yes       Intervention Provide advice, education, support and counseling about physical activity/exercise needs.;Develop an individualized exercise prescription for aerobic and resistive training based on initial evaluation findings,  risk stratification, comorbidities and participant's personal goals.       Expected Outcomes Achievement of increased cardiorespiratory fitness and enhanced flexibility, muscular endurance and strength shown through measurements of functional capacity and personal statement of participant.          Exercise Goals Re-Evaluation :     Exercise Goals Re-Evaluation    Row Name 10/17/16 1511 10/27/16 1346           Exercise Goal Re-Evaluation   Exercise Goals Review Increase Physical Activity;Increase Strenth and Stamina Increase Physical Activity;Increase Strenth and Stamina      Comments Cleone is off to a good start in rehab.  She has already completed 3 full days of exercise!!  We will continue to monitor her progression.  Reviewed home exercise with pt today.  Pt plans to by walking at home for exercise.  Reviewed THR, pulse, RPE, sign and symptoms, NTG use, and when to  call 911 or MD.  Also discussed weather considerations and indoor options.  Pt voiced understanding.      Expected Outcomes Short: Continue to attend rehab classes.  Long: Make exercise part of daily routine.  Short: add walking to home exercise and continue exercising in class. Long: Continue walking and adding home exercise post [facility]         Discharge Exercise Prescription (Final Exercise Prescription Changes):     Exercise Prescription Changes - 10/27/16 1300      Home Exercise Plan   Plans to continue exercise at Home (comment)  walking at home   Frequency Add 1 additional day to program exercise sessions.   Initial Home Exercises Provided 10/27/16      Nutrition:  Target Goals: Understanding of nutrition guidelines, daily intake of sodium <1534m, cholesterol <2070m calories 30% from fat and 7% or less from saturated fats, daily to have 5 or more servings of fruits and vegetables.  Biometrics:     Pre Biometrics - 10/09/16 1556      Pre Biometrics   Height 5' 7.5" (1.715 m)   Weight 146 lb 12.8 oz (66.6 kg)   Waist Circumference 31 inches   Hip Circumference 36.5 inches   Waist to Hip Ratio 0.85 %   BMI (Calculated) 22.7       Nutrition Therapy Plan and Nutrition Goals:     Nutrition Therapy & Goals - 10/09/16 1445      Nutrition Therapy   RD appointment defered Yes     Intervention Plan   Intervention Prescribe, educate and counsel regarding individualized specific dietary modifications aiming towards targeted core components such as weight, hypertension, lipid management, diabetes, heart failure and other comorbidities.;Nutrition handout(s) given to patient.   Expected Outcomes Short Term Goal: Understand basic principles of dietary content, such as calories, fat, sodium, cholesterol and nutrients.;Short Term Goal: A plan has been developed with personal nutrition goals set during dietitian appointment.;Long Term Goal: Adherence to prescribed nutrition  plan.      Nutrition Discharge: Rate Your Plate Scores:     Nutrition Assessments - 10/09/16 1547      MEDFICTS Scores   Pre Score 103      Nutrition Goals Re-Evaluation:   Nutrition Goals Discharge (Final Nutrition Goals Re-Evaluation):   Psychosocial: Target Goals: Acknowledge presence or absence of significant depression and/or stress, maximize coping skills, provide positive support system. Participant is able to verbalize types and ability to use techniques and skills needed for reducing stress and depression.   Initial Review & Psychosocial Screening:  Quality of Life Scores:   PHQ-9: Recent Review Flowsheet Data    Depression screen [facility] 2/9 10/09/2016 10/07/2015   Decreased Interest 3 0   Down, Depressed, Hopeless 1 0   PHQ - 2 Score 4 0   Altered sleeping 1 -   Tired, decreased energy 3 -   Change in appetite 0 -   Feeling bad or failure about yourself  2 -   Trouble concentrating 1 -   Moving slowly or fidgety/restless 2 -   Suicidal thoughts 0 -   PHQ-9 Score 13 -   Difficult doing work/chores Extremely dIfficult -     Interpretation of Total Score  Total Score Depression Severity:  1-4 = Minimal depression, 5-9 = Mild depression, 10-14 = Moderate depression, 15-19 = Moderately severe depression, 20-27 = Severe depression   Psychosocial Evaluation and Intervention:     Psychosocial Evaluation - 10/11/16 1230      Psychosocial Evaluation & Interventions   Interventions Encouraged to exercise with the program and follow exercise prescription;Relaxation education;Stress management education   Comments Counselor met with Ms. Wasser [facility]) today for initial psychosocial evaluation.  She is an 83 year old who struggles with CHF and shortness of breath.  Amaira has several other health issues that she struggles with including more recently Meniere's Disease - for which she is seeing an ENT specialist; and she also has had heart problems with a pacemaker  inserted in February of 2017.  Talitha has a strong support system with a spouse of 60 years; a son and sister who live locally and she is also actively involved in her local church.  Erikah states she sleeps well and her appetite has "improved" recently.  She admits to a history of anxiety and taking "pills for stress" approximately 6 years ago, and realizes she has a great deal of current stress as well.  Areebah has several family members who struggle with addictions; some marital conflict; and she is the oldest of 77 children that presents some stress for her as well.  Darlyne has goals to breathe better; return to her normal activities and hopefully become better educated on her disease and how to cope/manage it.  Counselor shared with Janine that her PHQ-9 scores indicate "moderate depression" with a score of "13" and will be following with her to see if consistency in exercise improves this over the next few weeks.  Counselor mentioned Aayushi possibly seeing a therapist in the near future for help with stress and anxiety; as well as a possible medication evaluation if things don't improve soon.     Expected Outcomes Dayna will benefit from consistent exercise to achieve her stated goals.  The educational and psychoeducational components of this program will be helpful for Enrica to learn how to cope and manage her disease better.  She may benefit from a therapist to process stress and anxiety; as well as a possible medication evaluation.  Counselor will follow.   Continue Psychosocial Services  Follow up required by counselor      Psychosocial Re-Evaluation:   Psychosocial Discharge (Final Psychosocial Re-Evaluation):   Education: Education Goals: Education classes will be provided on a weekly basis, covering required topics. Participant will state understanding/return demonstration of topics presented.  Learning Barriers/Preferences:     Learning Barriers/Preferences - 10/09/16 1503       Learning Barriers/Preferences   Learning Barriers None   Learning Preferences None      Education Topics: Initial Evaluation Education: - Verbal, written and  demonstration of respiratory meds, RPE/PD scales, oximetry and breathing techniques. Instruction on use of nebulizers and MDIs: cleaning and proper use, rinsing mouth with steroid doses and importance of monitoring MDI activations.   Pulmonary Rehab from 10/23/2016 in [facility] Cardiac and Pulmonary Rehab  Date  10/09/16  Educator  [facility]  Instruction Review Code (retired)  2- meets goals/outcomes      General Nutrition Guidelines/Fats and Fiber: -Group instruction provided by verbal, written material, models and posters to present the general guidelines for heart healthy nutrition. Gives an explanation and review of dietary fats and fiber.   Pulmonary Rehab from 10/23/2016 in [facility] Cardiac and Pulmonary Rehab  Date  10/23/16  Educator  CR  Instruction Review Code (retired)  2- meets goals/outcomes      Controlling Sodium/Reading Food Labels: -Group verbal and written material supporting the discussion of sodium use in heart healthy nutrition. Review and explanation with models, verbal and written materials for utilization of the food label.   Exercise Physiology & Risk Factors: - Group verbal and written instruction with models to review the exercise physiology of the cardiovascular system and associated critical values. Details cardiovascular disease risk factors and the goals associated with each risk factor.   Aerobic Exercise & Resistance Training: - Gives group verbal and written discussion on the health impact of inactivity. On the components of aerobic and resistive training programs and the benefits of this training and how to safely progress through these programs.   Flexibility, Balance, General Exercise Guidelines: - Provides group verbal and written instruction on the benefits of flexibility and balance training programs.  Provides general exercise guidelines with specific guidelines to those with heart or lung disease. Demonstration and skill practice provided.   Pulmonary Rehab from 10/23/2016 in [facility] Cardiac and Pulmonary Rehab  Date  10/18/16  Educator  AS  Instruction Review Code (retired)  2- meets goals/outcomes      Stress Management: - Provides group verbal and written instruction about the health risks of elevated stress, cause of high stress, and healthy ways to reduce stress.   Depression: - Provides group verbal and written instruction on the correlation between heart/lung disease and depressed mood, treatment options, and the stigmas associated with seeking treatment.   Exercise & Equipment Safety: - Individual verbal instruction and demonstration of equipment use and safety with use of the equipment.   Pulmonary Rehab from 10/23/2016 in [facility] Cardiac and Pulmonary Rehab  Date  10/09/16  Educator  [facility]  Instruction Review Code (retired)  2- meets goals/outcomes      Infection Prevention: - Provides verbal and written material to individual with discussion of infection control including proper hand washing and proper equipment cleaning during exercise session.   Pulmonary Rehab from 10/23/2016 in [facility] Cardiac and Pulmonary Rehab  Date  10/09/16  Educator  [facility]  Instruction Review Code (retired)  2- meets [employer] Prevention: - Provides verbal and written material to individual with discussion of falls prevention and safety.   Pulmonary Rehab from 10/23/2016 in [facility] Cardiac and Pulmonary Rehab  Date  10/09/16  Educator  [facility]  Instruction Review Code (retired)  2- meets goals/outcomes      Diabetes: - Individual verbal and written instruction to review signs/symptoms of diabetes, desired ranges of glucose level fasting, after meals and with exercise. Advice that pre and post exercise glucose checks will be done for 3 sessions at entry of program.   Chronic Lung  Diseases: - Group verbal  and written instruction to review new updates, new respiratory medications, new advancements in procedures and treatments. Provide informative websites and "800" numbers of self-education.   Lung Procedures: - Group verbal and written instruction to describe testing methods done to diagnose lung disease. Review the outcome of test results. Describe the treatment choices: Pulmonary Function Tests, ABGs and oximetry.   Energy Conservation: - Provide group verbal and written instruction for methods to conserve energy, plan and organize activities. Instruct on pacing techniques, use of adaptive equipment and posture/positioning to relieve shortness of breath.   Triggers: - Group verbal and written instruction to review types of environmental controls: home humidity, furnaces, filters, dust mite/pet prevention, HEPA vacuums. To discuss weather changes, air quality and the benefits of nasal washing.   Exacerbations: - Group verbal and written instruction to provide: warning signs, infection symptoms, calling MD promptly, preventive modes, and value of vaccinations. Review: effective airway clearance, coughing and/or vibration techniques. Create an Sports administrator.   Oxygen: - Individual and group verbal and written instruction on oxygen therapy. Includes supplement oxygen, available portable oxygen systems, continuous and intermittent flow rates, oxygen safety, concentrators, and Medicare reimbursement for oxygen.   Respiratory Medications: - Group verbal and written instruction to review medications for lung disease. Drug class, frequency, complications, importance of spacers, rinsing mouth after steroid MDI's, and proper cleaning methods for nebulizers.   AED/CPR: - Group verbal and written instruction with the use of models to demonstrate the basic use of the AED with the basic ABC's of resuscitation.   Breathing Retraining: - Provides individuals verbal and written  instruction on purpose, frequency, and proper technique of diaphragmatic breathing and pursed-lipped breathing. Applies individual practice skills.   Anatomy and Physiology of the Lungs: - Group verbal and written instruction with the use of models to provide basic lung anatomy and physiology related to function, structure and complications of lung disease.   Anatomy & Physiology of the Heart: - Group verbal and written instruction and models provide basic cardiac anatomy and physiology, with the coronary electrical and arterial systems. Review of: AMI, Angina, Valve disease, Heart Failure, Cardiac Arrhythmia, Pacemakers, and the ICD.   Pulmonary Rehab from 10/23/2016 in [facility] Cardiac and Pulmonary Rehab  Date  10/13/16  Educator  [facility]  Instruction Review Code (retired)  2- meets goals/outcomes      Heart Failure: - Group verbal and written instruction on the basics of heart failure: signs/symptoms, treatments, explanation of ejection fraction, enlarged heart and cardiomyopathy.   Pulmonary Rehab from 10/23/2016 in [facility] Cardiac and Pulmonary Rehab  Date  10/13/16  Educator  [facility]  Instruction Review Code (retired)  2- meets goals/outcomes      Sleep Apnea: - Individual verbal and written instruction to review Obstructive Sleep Apnea. Review of risk factors, methods for diagnosing and types of masks and machines for OSA.   Anxiety: - Provides group, verbal and written instruction on the correlation between heart/lung disease and anxiety, treatment options, and management of anxiety.   Relaxation: - Provides group, verbal and written instruction about the benefits of relaxation for patients with heart/lung disease. Also provides patients with examples of relaxation techniques.   Cardiac Medications: - Group verbal and written instruction to review commonly prescribed medications for heart disease. Reviews the medication, class of the drug, and side effects.   Know Your Numbers: -Group  verbal and written instruction about important numbers in your health.  Review of Cholesterol, Blood Pressure, Diabetes, and BMI and the role they play in  your overall health.   Other: -Provides group and verbal instruction on various topics (see comments)    Knowledge Questionnaire Score:     Knowledge Questionnaire Score - 10/09/16 1445      Knowledge Questionnaire Score   Pre Score 5/10       Core Components/Risk Factors/Patient Goals at Admission:     Personal Goals and Risk Factors at Admission - 10/09/16 1453      Core Components/Risk Factors/Patient Goals on Admission    Weight Management Yes   Intervention Weight Management: Develop a combined nutrition and exercise program designed to reach desired caloric intake, while maintaining appropriate intake of nutrient and fiber, sodium and fats, and appropriate energy expenditure required for the weight goal.;Weight Management: Provide education and appropriate resources to help participant work on and attain dietary goals.;Weight Management/Obesity: Establish reasonable short term and long term weight goals.   Admit Weight 146 lb (66.2 kg)   Goal Weight: Short Term 140 lb (63.5 kg)   Goal Weight: Long Term 140 lb (63.5 kg)   Expected Outcomes Short Term: Continue to assess and modify interventions until short term weight is achieved;Long Term: Adherence to nutrition and physical activity/exercise program aimed toward attainment of established weight goal;Weight Maintenance: Understanding of the daily nutrition guidelines, which includes 25-35% calories from fat, 7% or less cal from saturated fats, less than 273m cholesterol, less than 1.5gm of sodium, & 5 or more servings of fruits and vegetables daily;Weight Loss: Understanding of general recommendations for a balanced deficit meal plan, which promotes 1-2 lb weight loss per week and includes a negative energy balance of [phone] kcal/d;Understanding recommendations for meals to  include 15-35% energy as protein, 25-35% energy from fat, 35-60% energy from carbohydrates, less than 2054mof dietary cholesterol, 20-35 gm of total fiber daily;Understanding of distribution of calorie intake throughout the day with the consumption of 4-5 meals/snacks   Improve shortness of breath with ADL's Yes   Intervention Provide education, individualized exercise plan and daily activity instruction to help decrease symptoms of SOB with activities of daily living.   Expected Outcomes Short Term: Achieves a reduction of symptoms when performing activities of daily living.   Develop more efficient breathing techniques such as purse lipped breathing and diaphragmatic breathing; and practicing self-pacing with activity Yes   Intervention Provide education, demonstration and support about specific breathing techniuqes utilized for more efficient breathing. Include techniques such as pursed lipped breathing, diaphragmatic breathing and self-pacing activity.   Expected Outcomes Short Term: Participant will be able to demonstrate and use breathing techniques as needed throughout daily activities.   Increase knowledge of respiratory medications and ability to use respiratory devices properly  Yes   Intervention Provide education and demonstration as needed of appropriate use of medications, inhalers, and oxygen therapy.   Expected Outcomes Short Term: Achieves understanding of medications use. Understands that oxygen is a medication prescribed by physician. Demonstrates appropriate use of inhaler and oxygen therapy.   Heart Failure Yes   Intervention Provide a combined exercise and nutrition program that is supplemented with education, support and counseling about heart failure. Directed toward relieving symptoms such as shortness of breath, decreased exercise tolerance, and extremity edema.   Expected Outcomes Improve functional capacity of life;Short term: Attendance in program 2-3 days a week with  increased exercise capacity. Reported lower sodium intake. Reported increased fruit and vegetable intake. Reports medication compliance.;Short term: Daily weights obtained and reported for increase. Utilizing diuretic protocols set by physician.;Long term: Adoption of self-care skills  and reduction of barriers for early signs and symptoms recognition and intervention leading to self-care maintenance.   Lipids Yes   Intervention Provide education and support for participant on nutrition & aerobic/resistive exercise along with prescribed medications to achieve LDL <25m, HDL >477m   Expected Outcomes Short Term: Participant states understanding of desired cholesterol values and is compliant with medications prescribed. Participant is following exercise prescription and nutrition guidelines.;Long Term: Cholesterol controlled with medications as prescribed, with individualized exercise RX and with personalized nutrition plan. Value goals: LDL < 7038mHDL > 40 mg.   Stress Yes   Intervention Offer individual and/or small group education and counseling on adjustment to heart disease, stress management and health-related lifestyle change. Teach and support self-help strategies.;Refer participants experiencing significant psychosocial distress to appropriate mental health specialists for further evaluation and treatment. When possible, include family members and significant others in education/counseling sessions.   Expected Outcomes Short Term: Participant demonstrates changes in health-related behavior, relaxation and other stress management skills, ability to obtain effective social support, and compliance with psychotropic medications if prescribed.;Long Term: Emotional wellbeing is indicated by absence of clinically significant psychosocial distress or social isolation.      Core Components/Risk Factors/Patient Goals Review:    Core Components/Risk Factors/Patient Goals at Discharge (Final Review):     ITP Comments:     ITP Comments    Row Name 10/09/16 1555 10/16/16 1209 10/30/16 0844       ITP Comments Medical evaluation completed. Chart sent to Dr. MarCharm Bargesrector of [facility] review and changes. Patient has slight episode of dizziness and has already talked to her ear doctor. Blood pressure increased after rest and water. 30 day review completed. ITP sent to Dr. MarEmily Filbertrector of [facility] with ITP unless changes are made by physician.          Comments: 30 day review

## 2016-10-30 NOTE — Progress Notes (Signed)
Daily Session Note  Patient Details  Name: Sierra Kaiser MRN: [id number] Date of Birth: 1934-01-30 Referring Provider:     Pulmonary Rehab from 10/09/2016 in [facility] Cardiac and Pulmonary Rehab  Referring Provider  [name] MD      Encounter Date: 10/30/2016  Check In:     Session Check In - 10/30/16 1147      Check-In   Location [facility]-Cardiac & Pulmonary Rehab   Staff Present Nada Maclachlan, BA, ACSM CEP, Exercise Physiologist;Kelly Amedeo Plenty, BS, ACSM CEP, Exercise Physiologist;Gerber Penza Flavia Shipper   Supervising physician immediately available to respond to emergencies [facility] immediately available ER MD   Physician(s) Dr. Mable Paris and Jimmye Norman   Medication changes reported     No   Fall or balance concerns reported    No   Warm-up and Cool-down Performed as group-led instruction   Resistance Training Performed Yes   VAD Patient? No     Pain Assessment   Currently in Pain? No/denies   Multiple Pain Sites No         History  Smoking Status  . Never Smoker  Smokeless Tobacco  . Never Used    Goals Met:  Proper associated with RPD/PD & O2 Sat Independence with exercise equipment Exercise tolerated well No report of cardiac concerns or symptoms Strength training completed today  Goals Unmet:  Not Applicable  Comments: Pt able to follow exercise prescription today without complaint.  Will continue to monitor for progression.   Dr. Emily Filbert is Medical Director for [place] and [facility]monary [facility].

## 2016-10-31 LAB — BASIC METABOLIC PANEL
BUN/Creatinine Ratio: 17 (ref 12–28)
BUN: 25 mg/dL (ref 8–27)
CO2: 27 mmol/L (ref 20–29)
Calcium: 9.7 mg/dL (ref 8.7–10.3)
Chloride: 107 mmol/L — ABNORMAL HIGH (ref 96–106)
Creatinine, Ser: 1.47 mg/dL — ABNORMAL HIGH (ref 0.57–1.00)
GFR calc Af Amer: 38 mL/min/{1.73_m2} — ABNORMAL LOW (ref >59)
GFR calc non Af Amer: 33 mL/min/{1.73_m2} — ABNORMAL LOW (ref >59)
Glucose: 81 mg/dL (ref 65–99)
Potassium: 5.3 mmol/L — ABNORMAL HIGH (ref 3.5–5.2)
Sodium: 148 mmol/L — ABNORMAL HIGH (ref 134–144)

## 2016-11-01 DIAGNOSIS — I509 Heart failure, unspecified: Secondary | ICD-10-CM | POA: Diagnosis not present

## 2016-11-01 DIAGNOSIS — I5032 Chronic diastolic (congestive) heart failure: Secondary | ICD-10-CM

## 2016-11-01 NOTE — Progress Notes (Signed)
Daily Session Note  Patient Details  Name: Sierra Kaiser MRN: [id number] Date of Birth: Jul 22, 1933 Referring Provider:     Pulmonary Rehab from 10/09/2016 in [facility] Cardiac and Pulmonary Rehab  Referring Provider  [name] MD      Encounter Date: 11/01/2016  Check In:     Session Check In - 11/01/16 1145      Check-In   Location [facility]-Cardiac & Pulmonary Rehab   Staff Present Alberteen Sam, MA, ACSM RCEP, Exercise Physiologist;Noriel Guthrie Flavia Shipper   Supervising physician immediately available to respond to emergencies [facility] immediately available ER MD   Physician(s) Dr. Burlene Arnt and [facility]   Medication changes reported     No   Fall or balance concerns reported    No   Warm-up and Cool-down Performed as group-led instruction   Resistance Training Performed Yes   VAD Patient? No     Pain Assessment   Currently in Pain? No/denies   Multiple Pain Sites No         History  Smoking Status  . Never Smoker  Smokeless Tobacco  . Never Used    Goals Met:  Proper associated with RPD/PD & O2 Sat Independence with exercise equipment Exercise tolerated well No report of cardiac concerns or symptoms Strength training completed today  Goals Unmet:  Not Applicable  Comments: Pt able to follow exercise prescription today without complaint.  Will continue to monitor for progression.   Dr. Emily Filbert is Medical Director for [place] and [facility].

## 2016-11-02 NOTE — Telephone Encounter (Signed)
Lets bring her in to discuss thx

## 2016-11-03 DIAGNOSIS — I5032 Chronic diastolic (congestive) heart failure: Secondary | ICD-10-CM

## 2016-11-03 DIAGNOSIS — I509 Heart failure, unspecified: Secondary | ICD-10-CM | POA: Diagnosis not present

## 2016-11-03 NOTE — Telephone Encounter (Signed)
I attempted to call the patient- she just left per her spouse, will be back around 1 pm. I advised I will call back later today.

## 2016-11-03 NOTE — Telephone Encounter (Signed)
I spoke with the patient- she will come in on 11/07/16 to see Dr. [name] to discuss device upgrade further.

## 2016-11-03 NOTE — Progress Notes (Signed)
Daily Session Note  Patient Details  Name: Sierra Kaiser MRN: [id number] Date of Birth: September 20, 1933 Referring Provider:     Pulmonary Rehab from 10/09/2016 in [facility] Cardiac and Pulmonary Rehab  Referring Provider  [name] MD      Encounter Date: 11/03/2016  Check In:     Session Check In - 11/03/16 1219      Check-In   Location [facility]-Cardiac & Pulmonary Rehab   Staff Present Nada Maclachlan, BA, ACSM CEP, Exercise Physiologist;Salik Grewell Darrin Nipper, [place], ACSM RCEP, Exercise Physiologist   Supervising physician immediately available to respond to emergencies [facility] immediately available ER MD   Physician(s) Dr. Burlene Arnt and Alfred Levins   Medication changes reported     No   Fall or balance concerns reported    No   Warm-up and Cool-down Performed as group-led instruction   Resistance Training Performed Yes   VAD Patient? No     Pain Assessment   Currently in Pain? No/denies   Multiple Pain Sites No         History  Smoking Status  . Never Smoker  Smokeless Tobacco  . Never Used    Goals Met:  Proper associated with RPD/PD & O2 Sat Independence with exercise equipment Exercise tolerated well No report of cardiac concerns or symptoms Strength training completed today  Goals Unmet:  Not Applicable  Comments: Pt able to follow exercise prescription today without complaint.  Will continue to monitor for progression. Reviewed home exercise with pt today.  Pt plans to walk at home and use weights for exercise.  Reviewed THR, pulse, RPE, sign and symptoms, and when to call 911 or MD.  Also discussed weather considerations and indoor options.  Also discussed importance of purchasing a pulse oximeter to monitor oxygen saturations at home and keep above 88%.  Pt voiced understanding.    Dr. Emily Filbert is Medical Director for [place] and [facility].

## 2016-11-06 ENCOUNTER — Other Ambulatory Visit: Payer: Self-pay | Admitting: Family Medicine

## 2016-11-07 ENCOUNTER — Encounter: Payer: Self-pay | Admitting: Internal Medicine

## 2016-11-07 ENCOUNTER — Ambulatory Visit (INDEPENDENT_AMBULATORY_CARE_PROVIDER_SITE_OTHER): Payer: Medicare Other | Admitting: Internal Medicine

## 2016-11-07 VITALS — BP 110/58 | HR 70 | Ht 68.0 in | Wt 149.0 lb

## 2016-11-07 DIAGNOSIS — Z950 Presence of cardiac pacemaker: Secondary | ICD-10-CM

## 2016-11-07 DIAGNOSIS — I4821 Permanent atrial fibrillation: Secondary | ICD-10-CM

## 2016-11-07 DIAGNOSIS — I482 Chronic atrial fibrillation: Secondary | ICD-10-CM

## 2016-11-07 DIAGNOSIS — I442 Atrioventricular block, complete: Secondary | ICD-10-CM | POA: Diagnosis not present

## 2016-11-07 DIAGNOSIS — I5030 Unspecified diastolic (congestive) heart failure: Secondary | ICD-10-CM | POA: Diagnosis not present

## 2016-11-07 LAB — CUP PACEART INCLINIC DEVICE CHECK
Battery Remaining Longevity: 134 mo
Battery Voltage: 2.98 V
Brady Statistic RA Percent Paced: 0 %
Brady Statistic RV Percent Paced: 99.43 %
Date Time Interrogation Session: 20180904135953
Implantable Lead Implant Date: 20160217
Implantable Lead Location: 753860
Implantable Lead Model: 1948
Implantable Pulse Generator Implant Date: 20160217
Lead Channel Impedance Value: 625 Ohm
Lead Channel Pacing Threshold Amplitude: 0.5 V
Lead Channel Pacing Threshold Pulse Width: 0.4 ms
Lead Channel Sensing Intrinsic Amplitude: 12 mV
Lead Channel Setting Pacing Amplitude: 0.875
Lead Channel Setting Pacing Pulse Width: 0.4 ms
Lead Channel Setting Sensing Sensitivity: 2.5 mV
Pulse Gen Model: 2240
Pulse Gen Serial Number: 3050195

## 2016-11-07 NOTE — Progress Notes (Signed)
Patient Care Team: [name], MD as PCP - General (Family Medicine) Rockey Situ Kathlene November, MD as Consulting Physician (Cardiology)   HPI  Sierra Kaiser is a 83 y.o. female Seen in follow-up for AV ablation for permanent atrial fibrillation with poorly controlled ventricular response. Initially she was very short of breath and the device was reprogrammed  There had been gradual improvement She had undergone previous atrial fibrillation ablation at [facility].  Echo 3/16 demonstrated normal LV function  with moderate MR. Echocardiogram 8/15 described Mild-moderate MRShe had TEE at [facility] from 10/14. This report was read>> no description of eccentric MR was noted.  Echo 4/17 demonstrated normal LV function again with mild-moderate MR and mild-moderate TR.  She continues to struggle with worsening exercise tolerance and fatigue. It is relieved by rest. It is unassociated with chest discomfort. She sleeps poorly but denies nocturnal dyspnea or orthopnea.    There great deal of psychosocial stressors. Her family is thought that she needed to address this. She does not agree. Her family has gotten her appointment to see Dr. [name] at [facility]  His notes were reviewed;  They were unimpressed by the MR, but w modest interval LV function worsening, normal>45-50%  Aldactone  started cx by hyperkalemia  She is disinclined at this juncture to undergo CRT upgrade     Past Medical History:  Diagnosis Date  . Anxiety   . CAD (coronary artery disease)   . CKD stage 3 secondary to diabetes ([place])   . Esophageal reflux   . GI bleeding 2012   during EGD necessitating open surgery  . Hiatal hernia   . History of blood transfusion   . History of diverticulitis   . History of UTI   . HTN (hypertension)   . Hyperlipidemia   . Meniere's disease   . Mitral regurgitation   . Osteopenia 12/11/2015   T score -1.2 femur, -2.0 spine (12/2015)  . Permanent atrial fibrillation ([place])    a. permanent b. s/p  PVI RFA at [facility] 10/14 c. failed amio (neuro toxicity) and Tikosyn d. 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 PUD (peptic ulcer disease)   . Raynaud's disease   . SNHL (sensorineural hearing loss)    right ear  . Syncopal episodes   . Tinnitus   . Ulcerative colitis ([place])    per prior records  . Urinary incontinence, urge     Past Surgical History:  Procedure Laterality Date  . APPENDECTOMY  1973  . AV NODE ABLATION N/A 05/06/2014   Procedure: AV NODE ABLATION;  Surgeon: [name], MD;  Location: [facility] CATH LAB;  Service: Cardiovascular;  Laterality: N/A;  . CARDIAC CATHETERIZATION  2014   [facility]  . CARDIAC ELECTROPHYSIOLOGY STUDY AND ABLATION    . CHOLECYSTECTOMY  2010  . COLONOSCOPY  2010   polyps  . HEMORRHOID SURGERY    . LAPAROTOMY  2012   EGD biopsy led to bleeding - needed laparotomy to stop bleed (Dr Jimmye Norman at [facility])  . PERMANENT PACEMAKER INSERTION N/A 04/22/2014   STJ single chamber pacemaker implanted by Dr [name]  . TRANSESOPHAGEAL ECHOCARDIOGRAM WITH CARDIOVERSION  2014   [facility]  . UPPER GI ENDOSCOPY  2012   with polypectomy  . VAGINAL HYSTERECTOMY  1973   elective; ovaries remained    Current Outpatient Prescriptions  Medication Sig Dispense Refill  . Biotin (BIOTIN 5000) 5 MG CAPS Take by mouth.    Marland Kitchen  diphenhydrAMINE-zinc acetate (BENADRYL) cream Apply 1 application topically 2 (two) times daily as needed for itching.    . fluticasone (FLONASE) 50 MCG/ACT nasal spray     . furosemide (LASIX) 40 MG tablet Take 40 mg by mouth daily.    Marland Kitchen omeprazole (PRILOSEC) 40 MG capsule Take 1 capsule (40 mg total) by mouth daily. 90 capsule 3  . Polyethyl Glycol-Propyl Glycol (SYSTANE OP) Place 1 drop into both eyes daily as needed (dry eyes).    . Rivaroxaban (XARELTO) 15 MG TABS tablet Take 1 tablet (15 mg total) by mouth daily with supper. 90 tablet 3  . rosuvastatin (CRESTOR) 20 MG tablet Take 1 tablet (20 mg total) by  mouth daily. 90 tablet 3   No current facility-administered medications for this visit.     Allergies  Allergen Reactions  . Contrast Media [Iodinated Diagnostic Agents] Hives       . Metronidazole Itching and Other (See Comments)    Hands really red  . Diltiazem Hcl Itching and Rash    Review of Systems negative except from HPI and PMH  Physical Exam BP (!) 110/58 (BP Location: Left Arm, Patient Position: Sitting, Cuff Size: Normal)   Pulse 70   Ht 5\' 8"  (1.727 m)   Wt 149 lb (67.6 kg)   BMI 22.66 kg/m  Well developed and nourished in no acute distress HENT normal Neck supple with JVP-7 Carotids brisk and full without bruits Clear Regular rate and rhythm, 2/6 murmurs or gallops Abd-soft with active BS without hepatomegaly No Clubbing cyanosis edema Skin-warm and dry A & Oriented  Grossly normal sensory and motor function    ECG atrial fibrillation with ventricular pacing rate of 70  Assessment and  Plan  Atrial fibrillation  Complete heart block status post AV ablation  HFpEF   Normal>> 45-50%  LV function   MR mod  Anticoagulation    MR assessed at [facility], not felt to be significant  Aldactone started but cx by hyperkalemia and will stop  Recommended consideration of CRT upgrade;  Not sure about this  Reviewing the BLOCK CHF data, the benefits are  Marginal in all ICD endpoints, and this does not include the procedural risks assoc with upgrade.  The pt would like to wait and I would concur  Will repeat echo in 3-4 months and then readdress  Euvolemic continue current meds  On Anticoagulation;  No bleeding issues   We spent more than 50% of our >25 min visit in face to face counseling regarding the above   .

## 2016-11-07 NOTE — Patient Instructions (Signed)
Medication Instructions: - Your physician has recommended you make the following change in your medication:  1) Stop aldactone (spironolactone)  Labwork: - none ordered  Procedures/Testing: - Your physician has requested that you have an echocardiogram- in December 2018. Echocardiography is a painless test that uses sound waves to create images of your heart. It provides your doctor with information about the size and shape of your heart and how well your heart's chambers and valves are working. This procedure takes approximately one hour. There are no restrictions for this procedure.  Follow-Up: - Remote monitoring is used to monitor your Pacemaker of ICD from home. This monitoring reduces the number of office visits required to check your device to one time per year. It allows [place] to keep an eye on the functioning of your device to ensure it is working properly. You are scheduled for a device check from home on 02/06/17. You may send your transmission at any time that day. If you have a wireless device, the transmission will be sent automatically. After your physician reviews your transmission, you will receive a postcard with your next transmission date.  - Your physician wants you to follow-up in: January 2019 with Dr. [name]. You will receive a reminder letter in the mail two months in advance. If you don't receive a letter, please call our office to schedule the follow-up appointment.   Any Additional Special Instructions Will Be Listed Below (If Applicable).     If you need a refill on your cardiac medications before your next appointment, please call your pharmacy.

## 2016-11-08 ENCOUNTER — Encounter: Payer: Medicare Other | Attending: Internal Medicine | Admitting: *Deleted

## 2016-11-08 DIAGNOSIS — I509 Heart failure, unspecified: Secondary | ICD-10-CM | POA: Insufficient documentation

## 2016-11-08 DIAGNOSIS — I5032 Chronic diastolic (congestive) heart failure: Secondary | ICD-10-CM

## 2016-11-08 NOTE — Progress Notes (Signed)
Daily Session Note  Patient Details  Name: Sierra Kaiser MRN: [id number] Date of Birth: [birth year] Referring Provider:     Pulmonary Rehab from 10/09/2016 in [facility] Cardiac and Pulmonary Rehab  Referring Provider  [name] MD      Encounter Date: 11/08/2016  Check In:     Session Check In - 11/08/16 1124      Check-In   Location [facility]-Cardiac & Pulmonary Rehab   Staff Present Alberteen Sam, MA, ACSM RCEP, Exercise Physiologist;Meredith Sherryll Burger, RN BSN;Joseph Flavia Shipper   Supervising physician immediately available to respond to emergencies [facility] immediately available ER MD   Physician(s) Drs. Karma Greaser and Archie Balboa   Medication changes reported     No   Fall or balance concerns reported    No   Warm-up and Cool-down Performed as group-led Location manager Performed Yes   VAD Patient? No     Pain Assessment   Currently in Pain? No/denies   Multiple Pain Sites No         History  Smoking Status  . Never Smoker  Smokeless Tobacco  . Never Used    Goals Met:  Proper associated with RPD/PD & O2 Sat Independence with exercise equipment Using PLB without cueing & demonstrates good technique Exercise tolerated well No report of cardiac concerns or symptoms Strength training completed today  Goals Unmet:  Not Applicable  Comments: Pt able to follow exercise prescription today without complaint.  Will continue to monitor for progression.    Dr. Emily Filbert is Medical Director for [place] and [facility]monary [facility].

## 2016-11-13 DIAGNOSIS — I509 Heart failure, unspecified: Secondary | ICD-10-CM | POA: Diagnosis not present

## 2016-11-13 DIAGNOSIS — I5032 Chronic diastolic (congestive) heart failure: Secondary | ICD-10-CM

## 2016-11-13 NOTE — Progress Notes (Signed)
Daily Session Note  Patient Details  Name: Sierra Kaiser MRN: [id number] Date of Birth: Jan 03, 1934 Referring Provider:     Pulmonary Rehab from 10/09/2016 in [facility] Cardiac and Pulmonary Rehab  Referring Provider  [name] MD      Encounter Date: 11/13/2016  Check In:     Session Check In - 11/13/16 1153      Check-In   Location [facility]-Cardiac & Pulmonary Rehab   Staff Present Nada Maclachlan, BA, ACSM CEP, Exercise Physiologist;Kelly Amedeo Plenty, BS, ACSM CEP, Exercise Physiologist;Kinlie Janice Flavia Shipper   Supervising physician immediately available to respond to emergencies [facility] immediately available ER MD   Physician(s) Dr. Corky Downs and Jimmye Norman   Medication changes reported     No   Fall or balance concerns reported    No   Warm-up and Cool-down Performed as group-led instruction   Resistance Training Performed Yes   VAD Patient? No     Pain Assessment   Currently in Pain? No/denies   Multiple Pain Sites No         History  Smoking Status  . Never Smoker  Smokeless Tobacco  . Never Used    Goals Met:  Independence with exercise equipment Exercise tolerated well No report of cardiac concerns or symptoms Strength training completed today  Goals Unmet:  Not Applicable  Comments: Pt able to follow exercise prescription today without complaint.  Will continue to monitor for progression.   Dr. Emily Filbert is Medical Director for [place] and [facility].

## 2016-11-15 DIAGNOSIS — I509 Heart failure, unspecified: Secondary | ICD-10-CM | POA: Diagnosis not present

## 2016-11-15 DIAGNOSIS — I5032 Chronic diastolic (congestive) heart failure: Secondary | ICD-10-CM

## 2016-11-15 NOTE — Progress Notes (Signed)
Daily Session Note  Patient Details  Name: Sierra Kaiser MRN: [id number] Date of Birth: Feb 11, 1934 Referring Provider:     Pulmonary Rehab from 10/09/2016 in [facility] Cardiac and Pulmonary Rehab  Referring Provider  [name] MD      Encounter Date: 11/15/2016  Check In:     Session Check In - 11/15/16 1132      Check-In   Location [facility]-Cardiac & Pulmonary Rehab   Staff Present Alberteen Sam, MA, ACSM RCEP, Exercise Physiologist;Meredith Sherryll Burger, RN BSN;Lorisa Scheid Flavia Shipper   Supervising physician immediately available to respond to emergencies [facility] immediately available ER MD   Physician(s) Dr. Alfred Levins and Jimmye Norman   Medication changes reported     No   Fall or balance concerns reported    No   Warm-up and Cool-down Performed as group-led instruction   Resistance Training Performed Yes   VAD Patient? No     Pain Assessment   Currently in Pain? No/denies   Multiple Pain Sites No           Exercise Prescription Changes - 11/14/16 1300      Response to Exercise   Blood Pressure (Admit) 110/62   Blood Pressure (Exit) 102/56   Heart Rate (Admit) 79 bpm   Heart Rate (Exercise) 91 bpm   Heart Rate (Exit) 68 bpm   Oxygen Saturation (Admit) 99 %   Oxygen Saturation (Exercise) 99 %   Oxygen Saturation (Exit) 97 %   Rating of Perceived Exertion (Exercise) 13   Perceived Dyspnea (Exercise) 2   Symptoms none   Duration Continue with 45 min of aerobic exercise without signs/symptoms of physical distress.   Intensity THRR unchanged     Progression   Progression Continue to progress workloads to maintain intensity without signs/symptoms of physical distress.   Average METs 2.27     Resistance Training   Training Prescription Yes   Weight 4 lbs   Reps 10-15     Interval Training   Interval Training No     Treadmill   MPH 2.2   Grade 0   Minutes 15   METs 2.68     NuStep   Level 3   SPM 88   Minutes 15   METs 2.3     REL-XR   Level 3   Speed 39    Minutes 15   METs 1.9     Home Exercise Plan   Plans to continue exercise at Home (comment)   Frequency Add 1 additional day to program exercise sessions.   Initial Home Exercises Provided 11/03/16      History  Smoking Status  . Never Smoker  Smokeless Tobacco  . Never Used    Goals Met:  Independence with exercise equipment Exercise tolerated well No report of cardiac concerns or symptoms Strength training completed today  Goals Unmet:  Not Applicable  Comments: Pt able to follow exercise prescription today without complaint.  Will continue to monitor for progression.   Dr. Emily Filbert is Medical Director for [place] and [facility].

## 2016-11-22 ENCOUNTER — Encounter: Payer: Medicare Other | Admitting: *Deleted

## 2016-11-22 DIAGNOSIS — I5032 Chronic diastolic (congestive) heart failure: Secondary | ICD-10-CM

## 2016-11-22 NOTE — Progress Notes (Signed)
Daily Session Note  Patient Details  Name: Sierra Kaiser MRN: [id number] Date of Birth: Dec 22, 1933 Referring Provider:     Pulmonary Rehab from 10/09/2016 in [facility] Cardiac and Pulmonary Rehab  Referring Provider  [name] MD      Encounter Date: 11/22/2016  Check In:     Session Check In - 11/22/16 1131      Check-In   Location [facility]-Cardiac & Pulmonary Rehab   Staff Present Renita Papa, RN BSN;Joseph Darrin Nipper, [place], ACSM RCEP, Exercise Physiologist   Supervising physician immediately available to respond to emergencies [facility] immediately available ER MD   Physician(s) Dr. Joni Fears and Jimmye Norman   Medication changes reported     No   Fall or balance concerns reported    No   Warm-up and Cool-down Performed as group-led instruction   Resistance Training Performed Yes   VAD Patient? No     Pain Assessment   Currently in Pain? No/denies         History  Smoking Status  . Never Smoker  Smokeless Tobacco  . Never Used    Goals Met:  Proper associated with RPD/PD & O2 Sat Independence with exercise equipment Using PLB without cueing & demonstrates good technique Exercise tolerated well Strength training completed today  Goals Unmet:  Not Applicable  Comments: Pt able to follow exercise prescription today without complaint.  Will continue to monitor for progression.    Dr. Emily Filbert is Medical Director for [name] and [facility].

## 2016-11-24 ENCOUNTER — Encounter: Payer: Medicare Other | Admitting: *Deleted

## 2016-11-24 DIAGNOSIS — I5032 Chronic diastolic (congestive) heart failure: Secondary | ICD-10-CM

## 2016-11-24 DIAGNOSIS — I509 Heart failure, unspecified: Secondary | ICD-10-CM | POA: Diagnosis not present

## 2016-11-24 NOTE — Progress Notes (Signed)
Daily Session Note  Patient Details  Name: Sierra Kaiser MRN: [id number] Date of Birth: 05/19/1933 Referring Provider:     Pulmonary Rehab from 10/09/2016 in [facility] Cardiac and Pulmonary Rehab  Referring Provider  [name] MD      Encounter Date: 11/24/2016  Check In:     Session Check In - 11/24/16 1135      Check-In   Location [facility]-Cardiac & Pulmonary Rehab   Staff Present Renita Papa, RN BSN;Joseph Darrin Nipper, [place], ACSM RCEP, Exercise Physiologist   Supervising physician immediately available to respond to emergencies [facility] immediately available ER MD   Physician(s) Dr. Archie Balboa and Joni Fears   Medication changes reported     No   Fall or balance concerns reported    No   Warm-up and Cool-down Performed as group-led instruction   Resistance Training Performed Yes   VAD Patient? No     Pain Assessment   Currently in Pain? No/denies         History  Smoking Status  . Never Smoker  Smokeless Tobacco  . Never Used    Goals Met:  Proper associated with RPD/PD & O2 Sat Independence with exercise equipment Using PLB without cueing & demonstrates good technique Exercise tolerated well Strength training completed today  Goals Unmet:  Not Applicable  Comments: Pt able to follow exercise prescription today without complaint.  Will continue to monitor for progression.    Dr. Emily Filbert is Medical Director for [place] and [facility].

## 2016-11-25 ENCOUNTER — Other Ambulatory Visit: Payer: Self-pay | Admitting: Cardiovascular Disease

## 2016-11-27 DIAGNOSIS — I5032 Chronic diastolic (congestive) heart failure: Secondary | ICD-10-CM

## 2016-11-27 DIAGNOSIS — I509 Heart failure, unspecified: Secondary | ICD-10-CM | POA: Diagnosis not present

## 2016-11-27 NOTE — Progress Notes (Signed)
Daily Session Note  Patient Details  Name: Sierra Kaiser MRN: [id number] Date of Birth: 06/26/1933 Referring Provider:     Pulmonary Rehab from 10/09/2016 in [facility] Cardiac and Pulmonary Rehab  Referring Provider  [name] MD      Encounter Date: 11/27/2016  Check In:     Session Check In - 11/27/16 1150      Check-In   Location [facility]-Cardiac & Pulmonary Rehab   Staff Present Nada Maclachlan, BA, ACSM CEP, Exercise Physiologist;Kelly Amedeo Plenty, BS, ACSM CEP, Exercise Physiologist;Ka Flammer Flavia Shipper   Supervising physician immediately available to respond to emergencies [facility] immediately available ER MD   Physician(s) Dr. Clearnce Hasten and [facility]   Medication changes reported     No   Fall or balance concerns reported    No   Warm-up and Cool-down Performed as group-led instruction   Resistance Training Performed Yes   VAD Patient? No     Pain Assessment   Currently in Pain? No/denies   Multiple Pain Sites No         History  Smoking Status  . Never Smoker  Smokeless Tobacco  . Never Used    Goals Met:  Independence with exercise equipment Exercise tolerated well No report of cardiac concerns or symptoms Strength training completed today  Goals Unmet:  Not Applicable  Comments: Pt able to follow exercise prescription today without complaint.  Will continue to monitor for progression.   Dr. Emily Filbert is Medical Director for [place] and [facility].

## 2016-11-27 NOTE — Progress Notes (Signed)
Pulmonary Individual Treatment Plan  Patient Details  Name: Sierra Kaiser MRN: [id number] Date of Birth: 09-11-1933 Referring Provider:     Pulmonary Rehab from 10/09/2016 in [facility] Cardiac and Pulmonary Rehab  Referring Provider  [name] MD      Initial Encounter Date:    Pulmonary Rehab from 10/09/2016 in [facility] Cardiac and Pulmonary Rehab  Date  10/09/16  Referring Provider  [name] MD      Visit Diagnosis: Chronic diastolic congestive heart failure ([place])  Patient's Home Medications on Admission:  Current Outpatient Prescriptions:  .  Biotin (BIOTIN 5000) 5 MG CAPS, Take by mouth., Disp: , Rfl:  .  diphenhydrAMINE-zinc acetate (BENADRYL) cream, Apply 1 application topically 2 (two) times daily as needed for itching., Disp: , Rfl:  .  fluticasone (FLONASE) 50 MCG/ACT nasal spray, , Disp: , Rfl:  .  furosemide (LASIX) 40 MG tablet, Take 40 mg by mouth daily., Disp: , Rfl:  .  omeprazole (PRILOSEC) 40 MG capsule, Take 1 capsule (40 mg total) by mouth daily., Disp: 90 capsule, Rfl: 3 .  Polyethyl Glycol-Propyl Glycol (SYSTANE OP), Place 1 drop into both eyes daily as needed (dry eyes)., Disp: , Rfl:  .  Rivaroxaban (XARELTO) 15 MG TABS tablet, Take 1 tablet (15 mg total) by mouth daily with supper., Disp: 90 tablet, Rfl: 3 .  rosuvastatin (CRESTOR) 20 MG tablet, Take 1 tablet (20 mg total) by mouth daily., Disp: 90 tablet, Rfl: 3  Past Medical History: Past Medical History:  Diagnosis Date  . Anxiety   . CAD (coronary artery disease)   . CKD stage 3 secondary to diabetes ([place])   . Esophageal reflux   . GI bleeding 2012   during EGD necessitating open surgery  . Hiatal hernia   . History of blood transfusion   . History of diverticulitis   . History of UTI   . HTN (hypertension)   . Hyperlipidemia   . Meniere's disease   . Mitral regurgitation   . Osteopenia 12/11/2015   T score -1.2 femur, -2.0 spine (12/2015)  . Permanent atrial fibrillation ([place])    a. permanent  b. s/p PVI RFA at [facility] 10/14 c. failed amio (neuro toxicity) and Tikosyn d. 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 PUD (peptic ulcer disease)   . Raynaud's disease   . SNHL (sensorineural hearing loss)    right ear  . Syncopal episodes   . Tinnitus   . Ulcerative colitis ([place])    per prior records  . Urinary incontinence, urge     Tobacco Use: History  Smoking Status  . Never Smoker  Smokeless Tobacco  . Never Used    Labs: Recent Review Flowsheet Data    Labs for ITP Cardiac and Pulmonary Rehab Latest Ref Rng & Units 03/26/2014 10/07/2015 08/15/2016   Cholestrol 100 - 199 mg/dL 293(A) 203(H) 159   LDLCALC 0 - 99 mg/dL 206 136(H) 73   HDL >39 mg/dL 45 42.50 57   Trlycerides 0 - 149 mg/dL 210(A) 125.0 143       Pulmonary Assessment Scores:     Pulmonary Assessment Scores    Row Name 10/09/16 1446 11/24/16 1232       ADL UCSD   ADL Phase Entry Mid    SOB Score total 63 45    Rest 0 0    Walk 2 2    Stairs 4 3    Bath 4 2  Dress 3 2    Shop 3 2      CAT Score   CAT Score 22  -      mMRC Score   mMRC Score 2  Simultaneous filing. User may not have seen previous data.  -       Pulmonary Function Assessment:     Pulmonary Function Assessment - 10/09/16 1559      Breath   Bilateral Breath Sounds Clear   Shortness of Breath Fear of Shortness of Breath      Exercise Target Goals:    Exercise Program Goal: Individual exercise prescription set with THRR, safety & activity barriers. Participant demonstrates ability to understand and report RPE using BORG scale, to self-measure pulse accurately, and to acknowledge the importance of the exercise prescription.  Exercise Prescription Goal: Starting with aerobic activity 30 plus minutes a day, 3 days per week for initial exercise prescription. Provide home exercise prescription and guidelines that participant acknowledges understanding prior to discharge.  Activity  Barriers & Risk Stratification:     Activity Barriers & Cardiac Risk Stratification - 10/09/16 1553      Activity Barriers & Cardiac Risk Stratification   Activity Barriers Deconditioning;Muscular Weakness;Shortness of Breath      6 Minute Walk:     6 Minute Walk    Row Name 10/09/16 1551         6 Minute Walk   Phase Initial     Distance 1250 feet     Walk Time 6 minutes     # of Rest Breaks 0     MPH 2.37     METS 2.72     RPE 12     Perceived Dyspnea  2     VO2 Peak 9.5     Symptoms Yes (comment)     Comments SOB,  Right sided chest pain 2/10     Resting HR 73 bpm     Resting BP 146/64     Max Ex. HR 96 bpm     Max Ex. BP 166/64     2 Minute Post BP 132/66       Interval HR   Baseline HR (retired) 73     1 Minute HR 94     2 Minute HR 96     3 Minute HR 95     4 Minute HR 94     5 Minute HR 94     6 Minute HR 94     2 Minute Post HR 74     Interval Heart Rate? Yes       Interval Oxygen   Interval Oxygen? Yes     Baseline Oxygen Saturation % 96 %     Resting Liters of Oxygen 0 L  Room Air     1 Minute Oxygen Saturation % 97 %     1 Minute Liters of Oxygen 0 L     2 Minute Oxygen Saturation % 96 %     2 Minute Liters of Oxygen 0 L     3 Minute Oxygen Saturation % 97 %     3 Minute Liters of Oxygen 0 L     4 Minute Oxygen Saturation % 97 %     4 Minute Liters of Oxygen 0 L     5 Minute Oxygen Saturation % 97 %     5 Minute Liters of Oxygen 0 L     6 Minute Oxygen Saturation % 97 %  6 Minute Liters of Oxygen 0 L     2 Minute Post Oxygen Saturation % 99 %     2 Minute Post Liters of Oxygen 0 L       Oxygen Initial Assessment:     Oxygen Initial Assessment - 10/09/16 1450      Home Oxygen   Home Oxygen Device None   Sleep Oxygen Prescription None   Home Exercise Oxygen Prescription None   Home at Rest Exercise Oxygen Prescription None     Intervention   Short Term Goals To learn and understand importance of monitoring SPO2 with pulse  oximeter and demonstrate accurate use of the pulse oximeter.;To Learn and understand importance of maintaining oxygen saturations>88%;To learn and demonstrate proper purse lipped breathing techniques or other breathing techniques.  does not have any respiratory medications   Long  Term Goals Maintenance of O2 saturations>88%;Exhibits proper breathing techniques, such as purse lipped breathing or other method taught during program session;Verbalizes importance of monitoring SPO2 with pulse oximeter and return demonstration      Oxygen Re-Evaluation:     Oxygen Re-Evaluation    Row Name 11/15/16 1210             Program Oxygen Prescription   Program Oxygen Prescription None         Home Oxygen   Home Oxygen Device None       Sleep Oxygen Prescription None       Home Exercise Oxygen Prescription None       Home at Rest Exercise Oxygen Prescription None         Goals/Expected Outcomes   Short Term Goals To learn and understand importance of monitoring SPO2 with pulse oximeter and demonstrate accurate use of the pulse oximeter.;To learn and understand importance of maintaining oxygen saturations>88%;To learn and demonstrate proper pursed lip breathing techniques or other breathing techniques.       Long  Term Goals Verbalizes importance of monitoring SPO2 with pulse oximeter and return demonstration;Maintenance of O2 saturations>88%;Exhibits proper breathing techniques, such as pursed lip breathing or other method taught during program session;Compliance with respiratory medication       Comments Sierra Kaiser like somedays she has improved and other days she feels like she has not. Her oxygen has been above 88 percent and has never dropped below 92 percent. She has practiced PLB breathing at home a little bit. Informed her to het a pulse oximeter to check her oxygen.       Goals/Expected Outcomes Short: work on PLB techniques. Obtain a pulse oximeter.  Long: Be proficient in PLB. Be  proficient in checking her oxygen independently.          Oxygen Discharge (Final Oxygen Re-Evaluation):     Oxygen Re-Evaluation - 11/15/16 1210      Program Oxygen Prescription   Program Oxygen Prescription None     Home Oxygen   Home Oxygen Device None   Sleep Oxygen Prescription None   Home Exercise Oxygen Prescription None   Home at Rest Exercise Oxygen Prescription None     Goals/Expected Outcomes   Short Term Goals To learn and understand importance of monitoring SPO2 with pulse oximeter and demonstrate accurate use of the pulse oximeter.;To learn and understand importance of maintaining oxygen saturations>88%;To learn and demonstrate proper pursed lip breathing techniques or other breathing techniques.   Long  Term Goals Verbalizes importance of monitoring SPO2 with pulse oximeter and return demonstration;Maintenance of O2 saturations>88%;Exhibits proper breathing techniques, such as pursed  lip breathing or other method taught during program session;Compliance with respiratory medication   Comments Sierra Kaiser like somedays she has improved and other days she feels like she has not. Her oxygen has been above 88 percent and has never dropped below 92 percent. She has practiced PLB breathing at home a little bit. Informed her to het a pulse oximeter to check her oxygen.   Goals/Expected Outcomes Short: work on PLB techniques. Obtain a pulse oximeter.  Long: Be proficient in PLB. Be proficient in checking her oxygen independently.      Initial Exercise Prescription:     Initial Exercise Prescription - 10/09/16 1500      Date of Initial Exercise RX and Referring Provider   Date 10/09/16   Referring Provider [name] MD     Treadmill   MPH 2.2   Grade 0   Minutes 15   METs 2.68     NuStep   Level 2   SPM 80   Minutes 15   METs 2     REL-XR   Level 1   Speed 50   Minutes 15   METs 2     Prescription Details   Frequency (times per week) 3   Duration  Progress to 45 minutes of aerobic exercise without signs/symptoms of physical distress     Intensity   THRR 40-80% of Max Heartrate 99-126   Ratings of Perceived Exertion 11-13   Perceived Dyspnea 0-4     Progression   Progression Continue to progress workloads to maintain intensity without signs/symptoms of physical distress.     Resistance Training   Training Prescription Yes   Weight 3 lbs   Reps 10-15      Perform Capillary Blood Glucose checks as needed.  Exercise Prescription Changes:     Exercise Prescription Changes    Row Name 10/09/16 1500 10/17/16 1500 10/27/16 1300 11/01/16 1500 11/03/16 1200     Response to Exercise   Blood Pressure (Admit) 146/64 102/62  - 136/70  -   Blood Pressure (Exercise) 166/64 146/50  -  -  -   Blood Pressure (Exit) 132/66 130/62  - 106/70  -   Heart Rate (Admit) 73 bpm 76 bpm  - 82 bpm  -   Heart Rate (Exercise) 96 bpm 77 bpm  - 93 bpm  -   Heart Rate (Exit) 74 bpm 74 bpm  - 51 bpm  -   Oxygen Saturation (Admit) 96 % 96 %  - 97 %  -   Oxygen Saturation (Exercise) 96 % 99 %  - 95 %  -   Oxygen Saturation (Exit) 99 % 99 %  - 96 %  -   Rating of Perceived Exertion (Exercise) 12 15  - 13  -   Perceived Dyspnea (Exercise) 2 3  - 2  -   Symptoms SOB, Right sided chest pain 2/10  -  - none  -   Comments walk test results  -  -  -  -   Duration  - Continue with 45 min of aerobic exercise without signs/symptoms of physical distress.  - Continue with 45 min of aerobic exercise without signs/symptoms of physical distress.  -   Intensity  - THRR unchanged  - THRR unchanged  -     Progression   Progression  - Continue to progress workloads to maintain intensity without signs/symptoms of physical distress.  - Continue to progress workloads to maintain intensity without signs/symptoms of physical  distress.  -   Average METs  - 2.16  - 2.33  -     Resistance Training   Training Prescription  - Yes  - Yes  -   Weight  - 3 lbs  - 3 lbs  -   Reps   - 10-15  - 10-15  -     Interval Training   Interval Training  - No  - No  -     Treadmill   MPH  - 2.2  - 2.2  -   Grade  - 0  - 0  -   Minutes  - 15  - 15  -   METs  - 2.68  - 2.68  -     NuStep   Level  - 2  - 2  -   SPM  - 76  - 77  -   Minutes  - 15  - 15  -   METs  - 2.1  - 1.98  -     REL-XR   Level  - 1  - 3  -   Speed  - 50  - 40  -   Minutes  - 15  - 15  -   METs  - 1.7  - 2.4  -     Home Exercise Plan   Plans to continue exercise at  -  - Home (comment)  walking at home Home (comment)  walking at home Home (comment)   Frequency  -  - Add 1 additional day to program exercise sessions. Add 1 additional day to program exercise sessions. Add 1 additional day to program exercise sessions.   Initial Home Exercises Provided  -  - 10/27/16 10/27/16 11/03/16   Row Name 11/14/16 1300             Response to Exercise   Blood Pressure (Admit) 110/62       Blood Pressure (Exit) 102/56       Heart Rate (Admit) 79 bpm       Heart Rate (Exercise) 91 bpm       Heart Rate (Exit) 68 bpm       Oxygen Saturation (Admit) 99 %       Oxygen Saturation (Exercise) 99 %       Oxygen Saturation (Exit) 97 %       Rating of Perceived Exertion (Exercise) 13       Perceived Dyspnea (Exercise) 2       Symptoms none       Duration Continue with 45 min of aerobic exercise without signs/symptoms of physical distress.       Intensity THRR unchanged         Progression   Progression Continue to progress workloads to maintain intensity without signs/symptoms of physical distress.       Average METs 2.27         Resistance Training   Training Prescription Yes       Weight 4 lbs       Reps 10-15         Interval Training   Interval Training No         Treadmill   MPH 2.2       Grade 0       Minutes 15       METs 2.68         NuStep   Level 3       SPM 88  Minutes 15       METs 2.3         REL-XR   Level 3       Speed 39       Minutes 15       METs 1.9          Home Exercise Plan   Plans to continue exercise at Home (comment)       Frequency Add 1 additional day to program exercise sessions.       Initial Home Exercises Provided 11/03/16          Exercise Comments:     Exercise Comments    Row Name 10/11/16 1244           Exercise Comments First full day of exercise!  Patient was oriented to gym and equipment including functions, settings, policies, and procedures.  Patient's individual exercise prescription and treatment plan were reviewed.  All starting workloads were established based on the results of the 6 minute walk test done at initial orientation visit.  The plan for exercise progression was also introduced and progression will be customized based on patient's performance and goals.          Exercise Goals and Review:     Exercise Goals    Row Name 10/09/16 1555             Exercise Goals   Increase Physical Activity Yes       Intervention Provide advice, education, support and counseling about physical activity/exercise needs.;Develop an individualized exercise prescription for aerobic and resistive training based on initial evaluation findings, risk stratification, comorbidities and participant's personal goals.       Expected Outcomes Achievement of increased cardiorespiratory fitness and enhanced flexibility, muscular endurance and strength shown through measurements of functional capacity and personal statement of participant.       Increase Strength and Stamina Yes       Intervention Provide advice, education, support and counseling about physical activity/exercise needs.;Develop an individualized exercise prescription for aerobic and resistive training based on initial evaluation findings, risk stratification, comorbidities and participant's personal goals.       Expected Outcomes Achievement of increased cardiorespiratory fitness and enhanced flexibility, muscular endurance and strength shown through measurements of  functional capacity and personal statement of participant.          Exercise Goals Re-Evaluation :     Exercise Goals Re-Evaluation    Row Name 10/17/16 1511 10/27/16 1346 11/01/16 1506 11/03/16 1230 11/14/16 1351     Exercise Goal Re-Evaluation   Exercise Goals Review Increase Physical Activity;Increase Strenth and Stamina Increase Physical Activity;Increase Strenth and Stamina Increase Physical Activity;Increase Strength and Stamina Increase Physical Activity;Increase Strength and Stamina;Knowledge and understanding of Target Heart Rate Range (THRR);Able to check pulse independently;Understanding of Exercise Prescription Increase Physical Activity;Increase Strength and Stamina   Comments Sierra Kaiser is off to a good start in rehab.  She has already completed 3 full days of exercise!!  We will continue to monitor her progression.  Reviewed home exercise with pt today.  Pt plans to by walking at home for exercise.  Reviewed THR, pulse, RPE, sign and symptoms, NTG use, and when to call 911 or MD.  Also discussed weather considerations and indoor options.  Pt voiced understanding. Sierra Kaiser continues to do well in rehab. She is now up to level 3 on the recumbent elliptical!  She continues to make improvements and wants to work hard.  We will continue to monitor her  progress.  Reviewed home exercise with pt today.  Pt plans to walk at home and use weights for exercise.  Reviewed THR, pulse, RPE, sign and symptoms, and when to call 911 or MD.  Also discussed weather considerations and indoor options.  Also discussed importance of purchasing a pulse oximeter to monitor oxygen saturations at home and keep above 88%.  Pt voiced understanding. Sierra Kaiser has been doing well in rehab.  She is now up to level 3 on the recumbent ellipitcal and NuStep.  She continues to bounce during stretching and needs constant reminding to stretch slowly.  We will continue to monitor her progress.    Expected Outcomes Short: Continue to  attend rehab classes.  Long: Make exercise part of daily routine.  Short: add walking to home exercise and continue exercising in class. Long: Continue walking and adding home exercise post [facility] Short: Continue to walk more at home.  Long: Continue to work on Scientist, research (life sciences).  Short: Add in at least one extra day at home for walking or join grandson's gym.  Long: Make exercise part of routine.  Short: Try to stretch better in class.  Long: Continue work on Education administrator.      Discharge Exercise Prescription (Final Exercise Prescription Changes):     Exercise Prescription Changes - 11/14/16 1300      Response to Exercise   Blood Pressure (Admit) 110/62   Blood Pressure (Exit) 102/56   Heart Rate (Admit) 79 bpm   Heart Rate (Exercise) 91 bpm   Heart Rate (Exit) 68 bpm   Oxygen Saturation (Admit) 99 %   Oxygen Saturation (Exercise) 99 %   Oxygen Saturation (Exit) 97 %   Rating of Perceived Exertion (Exercise) 13   Perceived Dyspnea (Exercise) 2   Symptoms none   Duration Continue with 45 min of aerobic exercise without signs/symptoms of physical distress.   Intensity THRR unchanged     Progression   Progression Continue to progress workloads to maintain intensity without signs/symptoms of physical distress.   Average METs 2.27     Resistance Training   Training Prescription Yes   Weight 4 lbs   Reps 10-15     Interval Training   Interval Training No     Treadmill   MPH 2.2   Grade 0   Minutes 15   METs 2.68     NuStep   Level 3   SPM 88   Minutes 15   METs 2.3     REL-XR   Level 3   Speed 39   Minutes 15   METs 1.9     Home Exercise Plan   Plans to continue exercise at Home (comment)   Frequency Add 1 additional day to program exercise sessions.   Initial Home Exercises Provided 11/03/16      Nutrition:  Target Goals: Understanding of nutrition guidelines, daily intake of sodium '1500mg'$ , cholesterol '200mg'$ , calories 30%  from fat and 7% or less from saturated fats, daily to have 5 or more servings of fruits and vegetables.  Biometrics:     Pre Biometrics - 10/09/16 1556      Pre Biometrics   Height 5' 7.5" (1.715 m)   Weight 146 lb 12.8 oz (66.6 kg)   Waist Circumference 31 inches   Hip Circumference 36.5 inches   Waist to Hip Ratio 0.85 %   BMI (Calculated) 22.7       Nutrition Therapy Plan and Nutrition Goals:  Nutrition Therapy & Goals - 10/09/16 1445      Nutrition Therapy   RD appointment defered Yes     Intervention Plan   Intervention Prescribe, educate and counsel regarding individualized specific dietary modifications aiming towards targeted core components such as weight, hypertension, lipid management, diabetes, heart failure and other comorbidities.;Nutrition handout(s) given to patient.   Expected Outcomes Short Term Goal: Understand basic principles of dietary content, such as calories, fat, sodium, cholesterol and nutrients.;Short Term Goal: A plan has been developed with personal nutrition goals set during dietitian appointment.;Long Term Goal: Adherence to prescribed nutrition plan.      Nutrition Discharge: Rate Your Plate Scores:     Nutrition Assessments - 10/09/16 1547      MEDFICTS Scores   Pre Score 103      Nutrition Goals Re-Evaluation:     Nutrition Goals Re-Evaluation    Row Name 11/15/16 1218             Goals   Current Weight 149 lb (67.6 kg)       Nutrition Goal Maintain current weight       Comment To maintain her weight she eats healthy. She does not eat fried foods. Sierra Kaiser eats veggitables, does not drink soda and drinks a fair amount water.       Expected Outcome Short: Maintain current weight and eat a healthy diet. Long: Increase water intake. Eat a heart healthy diet daily.          Nutrition Goals Discharge (Final Nutrition Goals Re-Evaluation):     Nutrition Goals Re-Evaluation - 11/15/16 1218      Goals   Current Weight 149  lb (67.6 kg)   Nutrition Goal Maintain current weight   Comment To maintain her weight she eats healthy. She does not eat fried foods. Sierra Kaiser eats veggitables, does not drink soda and drinks a fair amount water.   Expected Outcome Short: Maintain current weight and eat a healthy diet. Long: Increase water intake. Eat a heart healthy diet daily.      Psychosocial: Target Goals: Acknowledge presence or absence of significant depression and/or stress, maximize coping skills, provide positive support system. Participant is able to verbalize types and ability to use techniques and skills needed for reducing stress and depression.   Initial Review & Psychosocial Screening:   Quality of Life Scores:   PHQ-9: Recent Review Flowsheet Data    Depression screen [facility] 2/9 11/24/2016 10/09/2016 10/07/2015   Decreased Interest 2 3 0   Down, Depressed, Hopeless 0 1 0   PHQ - 2 Score 2 4 0   Altered sleeping 1 1 -   Tired, decreased energy 2 3 -   Change in appetite 2 0 -   Feeling bad or failure about yourself  1 2 -   Trouble concentrating 1 1 -   Moving slowly or fidgety/restless 2 2 -   Suicidal thoughts 0 0 -   PHQ-9 Score 11 13 -   Difficult doing work/chores Somewhat difficult Extremely dIfficult -     Interpretation of Total Score  Total Score Depression Severity:  1-4 = Minimal depression, 5-9 = Mild depression, 10-14 = Moderate depression, 15-19 = Moderately severe depression, 20-27 = Severe depression   Psychosocial Evaluation and Intervention:     Psychosocial Evaluation - 10/11/16 1230      Psychosocial Evaluation & Interventions   Interventions Encouraged to exercise with the program and follow exercise prescription;Relaxation education;Stress management education   Comments Counselor met  with Sierra Kaiser [facility]) today for initial psychosocial evaluation.  She is an 83 year old who struggles with CHF and shortness of breath.  Sierra Kaiser has several other health issues that she struggles  with including more recently Meniere's Disease - for which she is seeing an ENT specialist; and she also has had heart problems with a pacemaker inserted in February of 2017.  Maddilyn has a strong support system with a spouse of 13 years; a son and sister who live locally and she is also actively involved in her local church.  An states she sleeps well and her appetite has "improved" recently.  She admits to a history of anxiety and taking "pills for stress" approximately 6 years ago, and realizes she has a great deal of current stress as well.  Petrita has several family members who struggle with addictions; some marital conflict; and she is the oldest of 54 children that presents some stress for her as well.  Taite has goals to breathe better; return to her normal activities and hopefully become better educated on her disease and how to cope/manage it.  Counselor shared with Kaisyn that her PHQ-9 scores indicate "moderate depression" with a score of "13" and will be following with her to see if consistency in exercise improves this over the next few weeks.  Counselor mentioned Sherrelle possibly seeing a therapist in the near future for help with stress and anxiety; as well as a possible medication evaluation if things don't improve soon.     Expected Outcomes Sierra Kaiser will benefit from consistent exercise to achieve her stated goals.  The educational and psychoeducational components of this program will be helpful for Sierra Kaiser to learn how to cope and manage her disease better.  She may benefit from a therapist to process stress and anxiety; as well as a possible medication evaluation.  Counselor will follow.   Continue Psychosocial Services  Follow up required by counselor      Psychosocial Re-Evaluation:     Psychosocial Re-Evaluation    Sierra Kaiser Name 11/08/16 8527 11/15/16 1224           Psychosocial Re-Evaluation   Current issues with Current Stress Concerns Current Stress Concerns;Current Depression       Comments Counselor follow up with Sierra Kaiser today reporting having some health concerns with a "noise" in head that is being followed by an ENT and possibly a neurologist.  Her Dr. decreased her potassium levels yesterday and is hoping this may help as well.  Sierra Kaiser enjoys this program and "makes" herself come because she is aware of the benefits physically and emotionally.  Sierra Kaiser plans to get away with her siblings this weekend for a family gathering at her sister's in [place].  She is looking forward to this.  Counselor commended Sierra Kaiser on her commitment to this program and to consistent exercise even though she has so many other things going on at this time.   Grandson went to prison recently.  Her grandsons mother  lives on the street but  recently got a job . Great gand daughter had an [employer] scholorship dropped out after her father commited suicide.       Expected Outcomes Sierra Kaiser will continue to exercise and see her Dr. for follow up on the problems in her head and dizziness.   Short: continue to exercise in [facility] to minimize stress. Long: Find multiple ways to relieve stress and drepression other than Exercise.      Interventions Stress management education Relaxation education;Stress management  education;Encouraged to attend Pulmonary Rehabilitation for the exercise      Continue Psychosocial Services   - Follow up required by staff         Psychosocial Discharge (Final Psychosocial Re-Evaluation):     Psychosocial Re-Evaluation - 11/15/16 1224      Psychosocial Re-Evaluation   Current issues with Current Stress Concerns;Current Depression   Comments Grandson went to prison recently.  Her grandsons mother  lives on the street but  recently got a job . Great gand daughter had an [employer] scholorship dropped out after her father commited suicide.    Expected Outcomes Short: continue to exercise in [facility] to minimize stress. Long: Find multiple ways to relieve stress and drepression other than  Exercise.   Interventions Relaxation education;Stress management education;Encouraged to attend Pulmonary Rehabilitation for the exercise   Continue Psychosocial Services  Follow up required by staff      Education: Education Goals: Education classes will be provided on a weekly basis, covering required topics. Participant will state understanding/return demonstration of topics presented.  Learning Barriers/Preferences:     Learning Barriers/Preferences - 10/09/16 1503      Learning Barriers/Preferences   Learning Barriers None   Learning Preferences None      Education Topics: Initial Evaluation Education: - Verbal, written and demonstration of respiratory meds, RPE/PD scales, oximetry and breathing techniques. Instruction on use of nebulizers and MDIs: cleaning and proper use, rinsing mouth with steroid doses and importance of monitoring MDI activations.   Pulmonary Rehab from 11/24/2016 in [facility] Cardiac and Pulmonary Rehab  Date  10/09/16  Educator  [facility]  Instruction Review Code (retired)  2- meets goals/outcomes  Instruction Review Code  1- IT trainer Nutrition Guidelines/Fats and Fiber: -Group instruction provided by verbal, written material, models and posters to present the general guidelines for heart healthy nutrition. Gives an explanation and review of dietary fats and fiber.   Pulmonary Rehab from 11/24/2016 in [facility] Cardiac and Pulmonary Rehab  Date  10/23/16  Educator  CR  Instruction Review Code  1- Verbalizes Understanding      Controlling Sodium/Reading Food Labels: -Group verbal and written material supporting the discussion of sodium use in heart healthy nutrition. Review and explanation with models, verbal and written materials for utilization of the food label.   Pulmonary Rehab from 11/24/2016 in [facility] Cardiac and Pulmonary Rehab  Date  10/30/16  Educator  CR  Instruction Review Code  1- Verbalizes Understanding      Exercise  Physiology & Risk Factors: - Group verbal and written instruction with models to review the exercise physiology of the cardiovascular system and associated critical values. Details cardiovascular disease risk factors and the goals associated with each risk factor.   Aerobic Exercise & Resistance Training: - Gives group verbal and written discussion on the health impact of inactivity. On the components of aerobic and resistive training programs and the benefits of this training and how to safely progress through these programs.   Flexibility, Balance, General Exercise Guidelines: - Provides group verbal and written instruction on the benefits of flexibility and balance training programs. Provides general exercise guidelines with specific guidelines to those with heart or lung disease. Demonstration and skill practice provided.   Pulmonary Rehab from 11/24/2016 in [facility] Cardiac and Pulmonary Rehab  Date  10/18/16  Educator  AS  Instruction Review Code  1- Verbalizes Understanding      Stress Management: - Provides group verbal and written instruction about the  health risks of elevated stress, cause of high stress, and healthy ways to reduce stress.   Pulmonary Rehab from 11/24/2016 in [facility] Cardiac and Pulmonary Rehab  Date  11/22/16  Educator  [facility]  Instruction Review Code  1- Verbalizes Understanding      Depression: - Provides group verbal and written instruction on the correlation between heart/lung disease and depressed mood, treatment options, and the stigmas associated with seeking treatment.   Exercise & Equipment Safety: - Individual verbal instruction and demonstration of equipment use and safety with use of the equipment.   Pulmonary Rehab from 11/24/2016 in [facility] Cardiac and Pulmonary Rehab  Date  10/09/16  Educator  [facility]  Instruction Review Code  1- Verbalizes Understanding      Infection Prevention: - Provides verbal and written material to individual with discussion of  infection control including proper hand washing and proper equipment cleaning during exercise session.   Pulmonary Rehab from 11/24/2016 in [facility] Cardiac and Pulmonary Rehab  Date  10/09/16  Educator  [facility]  Instruction Review Code  1- Verbalizes Understanding      Falls Prevention: - Provides verbal and written material to individual with discussion of falls prevention and safety.   Pulmonary Rehab from 11/24/2016 in [facility] Cardiac and Pulmonary Rehab  Date  10/09/16  Educator  [facility]  Instruction Review Code (retired)  2- meets goals/outcomes  Instruction Review Code  1- Science writer Understanding      Diabetes: - Individual verbal and written instruction to review signs/symptoms of diabetes, desired ranges of glucose level fasting, after meals and with exercise. Advice that pre and post exercise glucose checks will be done for 3 sessions at entry of program.   Chronic Lung Diseases: - Group verbal and written instruction to review new updates, new respiratory medications, new advancements in procedures and treatments. Provide informative websites and "800" numbers of self-education.   Lung Procedures: - Group verbal and written instruction to describe testing methods done to diagnose lung disease. Review the outcome of test results. Describe the treatment choices: Pulmonary Function Tests, ABGs and oximetry.   Energy Conservation: - Provide group verbal and written instruction for methods to conserve energy, plan and organize activities. Instruct on pacing techniques, use of adaptive equipment and posture/positioning to relieve shortness of breath.   Pulmonary Rehab from 11/24/2016 in [facility] Cardiac and Pulmonary Rehab  Date  11/15/16  Educator  [facility]  Instruction Review Code  1- Verbalizes Understanding      Triggers: - Group verbal and written instruction to review types of environmental controls: home humidity, furnaces, filters, dust mite/pet prevention, HEPA vacuums. To discuss weather  changes, air quality and the benefits of nasal washing.   Exacerbations: - Group verbal and written instruction to provide: warning signs, infection symptoms, calling MD promptly, preventive modes, and value of vaccinations. Review: effective airway clearance, coughing and/or vibration techniques. Create an Sports administrator.   Oxygen: - Individual and group verbal and written instruction on oxygen therapy. Includes supplement oxygen, available portable oxygen systems, continuous and intermittent flow rates, oxygen safety, concentrators, and Medicare reimbursement for oxygen.   Respiratory Medications: - Group verbal and written instruction to review medications for lung disease. Drug class, frequency, complications, importance of spacers, rinsing mouth after steroid MDI's, and proper cleaning methods for nebulizers.   AED/CPR: - Group verbal and written instruction with the use of models to demonstrate the basic use of the AED with the basic ABC's of resuscitation.   Pulmonary Rehab from 11/24/2016 in [facility] Cardiac  and Pulmonary Rehab  Date  11/24/16  Educator  CE  Instruction Review Code  1- Verbalizes Understanding      Breathing Retraining: - Provides individuals verbal and written instruction on purpose, frequency, and proper technique of diaphragmatic breathing and pursed-lipped breathing. Applies individual practice skills.   Anatomy and Physiology of the Lungs: - Group verbal and written instruction with the use of models to provide basic lung anatomy and physiology related to function, structure and complications of lung disease.   Pulmonary Rehab from 11/24/2016 in [facility] Cardiac and Pulmonary Rehab  Date  11/08/16  Educator  [facility]  Instruction Review Code  1- Verbalizes Understanding      Anatomy & Physiology of the Heart: - Group verbal and written instruction and models provide basic cardiac anatomy and physiology, with the coronary electrical and arterial systems. Review of:  AMI, Angina, Valve disease, Heart Failure, Cardiac Arrhythmia, Pacemakers, and the ICD.   Pulmonary Rehab from 11/24/2016 in [facility] Cardiac and Pulmonary Rehab  Date  10/13/16  Educator  [facility]  Instruction Review Code  1- Verbalizes Understanding      Heart Failure: - Group verbal and written instruction on the basics of heart failure: signs/symptoms, treatments, explanation of ejection fraction, enlarged heart and cardiomyopathy.   Pulmonary Rehab from 11/24/2016 in [facility] Cardiac and Pulmonary Rehab  Date  10/13/16  Educator  [facility]  Instruction Review Code  1- Verbalizes Understanding      Sleep Apnea: - Individual verbal and written instruction to review Obstructive Sleep Apnea. Review of risk factors, methods for diagnosing and types of masks and machines for OSA.   Anxiety: - Provides group, verbal and written instruction on the correlation between heart/lung disease and anxiety, treatment options, and management of anxiety.   Pulmonary Rehab from 11/24/2016 in [facility] Cardiac and Pulmonary Rehab  Date  11/22/16  Educator  [facility]  Instruction Review Code  1- Verbalizes Understanding      Relaxation: - Provides group, verbal and written instruction about the benefits of relaxation for patients with heart/lung disease. Also provides patients with examples of relaxation techniques.   Cardiac Medications: - Group verbal and written instruction to review commonly prescribed medications for heart disease. Reviews the medication, class of the drug, and side effects.   Know Your Numbers: -Group verbal and written instruction about important numbers in your health.  Review of Cholesterol, Blood Pressure, Diabetes, and BMI and the role they play in your overall health.   Other: -Provides group and verbal instruction on various topics (see comments)    Knowledge Questionnaire Score:     Knowledge Questionnaire Score - 10/09/16 1445      Knowledge Questionnaire Score   Pre Score 5/10        Core Components/Risk Factors/Patient Goals at Admission:     Personal Goals and Risk Factors at Admission - 10/09/16 1453      Core Components/Risk Factors/Patient Goals on Admission    Weight Management Yes   Intervention Weight Management: Develop a combined nutrition and exercise program designed to reach desired caloric intake, while maintaining appropriate intake of nutrient and fiber, sodium and fats, and appropriate energy expenditure required for the weight goal.;Weight Management: Provide education and appropriate resources to help participant work on and attain dietary goals.;Weight Management/Obesity: Establish reasonable short term and long term weight goals.   Admit Weight 146 lb (66.2 kg)   Goal Weight: Short Term 140 lb (63.5 kg)   Goal Weight: Long Term 140 lb (63.5 kg)  Expected Outcomes Short Term: Continue to assess and modify interventions until short term weight is achieved;Long Term: Adherence to nutrition and physical activity/exercise program aimed toward attainment of established weight goal;Weight Maintenance: Understanding of the daily nutrition guidelines, which includes 25-35% calories from fat, 7% or less cal from saturated fats, less than '200mg'$  cholesterol, less than 1.5gm of sodium, & 5 or more servings of fruits and vegetables daily;Weight Loss: Understanding of general recommendations for a balanced deficit meal plan, which promotes 1-2 lb weight loss per week and includes a negative energy balance of [phone] kcal/d;Understanding recommendations for meals to include 15-35% energy as protein, 25-35% energy from fat, 35-60% energy from carbohydrates, less than '200mg'$  of dietary cholesterol, 20-35 gm of total fiber daily;Understanding of distribution of calorie intake throughout the day with the consumption of 4-5 meals/snacks   Improve shortness of breath with ADL's Yes   Intervention Provide education, individualized exercise plan and daily activity instruction to  help decrease symptoms of SOB with activities of daily living.   Expected Outcomes Short Term: Achieves a reduction of symptoms when performing activities of daily living.   Develop more efficient breathing techniques such as purse lipped breathing and diaphragmatic breathing; and practicing self-pacing with activity Yes   Intervention Provide education, demonstration and support about specific breathing techniuqes utilized for more efficient breathing. Include techniques such as pursed lipped breathing, diaphragmatic breathing and self-pacing activity.   Expected Outcomes Short Term: Participant will be able to demonstrate and use breathing techniques as needed throughout daily activities.   Increase knowledge of respiratory medications and ability to use respiratory devices properly  Yes   Intervention Provide education and demonstration as needed of appropriate use of medications, inhalers, and oxygen therapy.   Expected Outcomes Short Term: Achieves understanding of medications use. Understands that oxygen is a medication prescribed by physician. Demonstrates appropriate use of inhaler and oxygen therapy.   Heart Failure Yes   Intervention Provide a combined exercise and nutrition program that is supplemented with education, support and counseling about heart failure. Directed toward relieving symptoms such as shortness of breath, decreased exercise tolerance, and extremity edema.   Expected Outcomes Improve functional capacity of life;Short term: Attendance in program 2-3 days a week with increased exercise capacity. Reported lower sodium intake. Reported increased fruit and vegetable intake. Reports medication compliance.;Short term: Daily weights obtained and reported for increase. Utilizing diuretic protocols set by physician.;Long term: Adoption of self-care skills and reduction of barriers for early signs and symptoms recognition and intervention leading to self-care maintenance.   Lipids Yes    Intervention Provide education and support for participant on nutrition & aerobic/resistive exercise along with prescribed medications to achieve LDL '70mg'$ , HDL >'40mg'$ .   Expected Outcomes Short Term: Participant states understanding of desired cholesterol values and is compliant with medications prescribed. Participant is following exercise prescription and nutrition guidelines.;Long Term: Cholesterol controlled with medications as prescribed, with individualized exercise RX and with personalized nutrition plan. Value goals: LDL < '70mg'$ , HDL > 40 mg.   Stress Yes   Intervention Offer individual and/or small group education and counseling on adjustment to heart disease, stress management and health-related lifestyle change. Teach and support self-help strategies.;Refer participants experiencing significant psychosocial distress to appropriate mental health specialists for further evaluation and treatment. When possible, include family members and significant others in education/counseling sessions.   Expected Outcomes Short Term: Participant demonstrates changes in health-related behavior, relaxation and other stress management skills, ability to obtain effective social support, and compliance with psychotropic  medications if prescribed.;Long Term: Emotional wellbeing is indicated by absence of clinically significant psychosocial distress or social isolation.      Core Components/Risk Factors/Patient Goals Review:      Goals and Risk Factor Review    Row Name 11/15/16 1205             Core Components/Risk Factors/Patient Goals Review   Personal Goals Review Heart Failure;Stress;Lipids;Weight Management/Obesity;Improve shortness of breath with ADL's       Review Whitnie states the program has helped her alot since she started. Her dizziness has been better but is still lingers. She is taking her medication prescribed by the doctors with no changes currently.       Expected Outcomes Short: minimize  stress, maintain weight, SOB with ADL's. Get her lipids checked. Long: Maintain stress by contiuing to exercise after [facility].           Core Components/Risk Factors/Patient Goals at Discharge (Final Review):      Goals and Risk Factor Review - 11/15/16 1205      Core Components/Risk Factors/Patient Goals Review   Personal Goals Review Heart Failure;Stress;Lipids;Weight Management/Obesity;Improve shortness of breath with ADL's   Review Brantlee states the program has helped her alot since she started. Her dizziness has been better but is still lingers. She is taking her medication prescribed by the doctors with no changes currently.   Expected Outcomes Short: minimize stress, maintain weight, SOB with ADL's. Get her lipids checked. Long: Maintain stress by contiuing to exercise after [facility].       ITP Comments:     ITP Comments    Row Name 10/09/16 1555 10/16/16 1209 10/30/16 0844 11/27/16 0842     ITP Comments Medical evaluation completed. Chart sent to Dr. Charm Barges director of [place] for review and changes. Patient has slight episode of dizziness and has already talked to her ear doctor. Blood pressure increased after rest and water. 30 day review completed. ITP sent to Dr. Emily Filbert Director of [place]. Continue with ITP unless changes are made by physician.   30 day review completed. ITP sent to Dr. Emily Filbert Director of [place]. Continue with ITP unless changes are made by physician.         Comments: 30 day review

## 2016-11-29 DIAGNOSIS — I509 Heart failure, unspecified: Secondary | ICD-10-CM | POA: Diagnosis not present

## 2016-11-29 DIAGNOSIS — I5032 Chronic diastolic (congestive) heart failure: Secondary | ICD-10-CM

## 2016-11-29 NOTE — Progress Notes (Signed)
Daily Session Note  Patient Details  Name: Sierra Kaiser MRN: [id number] Date of Birth: 1933/05/28 Referring Provider:     Pulmonary Rehab from 10/09/2016 in [facility] Cardiac and Pulmonary Rehab  Referring Provider  [name] MD      Encounter Date: 11/29/2016  Check In:     Session Check In - 11/29/16 1139      Check-In   Location [facility]-Cardiac & Pulmonary Rehab   Staff Present Alberteen Sam, MA, ACSM RCEP, Exercise Physiologist;Meredith Sherryll Burger, RN BSN;Ardis Lawley Flavia Shipper   Supervising physician immediately available to respond to emergencies [facility] immediately available ER MD   Physician(s) Dr. Joni Fears and Burlene Arnt   Medication changes reported     No   Fall or balance concerns reported    No   Warm-up and Cool-down Performed as group-led instruction   Resistance Training Performed Yes   VAD Patient? No     Pain Assessment   Currently in Pain? No/denies   Multiple Pain Sites No           Exercise Prescription Changes - 11/28/16 1400      Response to Exercise   Blood Pressure (Admit) 120/70   Blood Pressure (Exit) 122/70   Heart Rate (Admit) 73 bpm   Heart Rate (Exercise) 97 bpm   Heart Rate (Exit) 70 bpm   Oxygen Saturation (Admit) 97 %   Oxygen Saturation (Exercise) 96 %   Oxygen Saturation (Exit) 97 %   Rating of Perceived Exertion (Exercise) 14   Perceived Dyspnea (Exercise) 3   Symptoms none   Duration Continue with 45 min of aerobic exercise without signs/symptoms of physical distress.   Intensity THRR unchanged     Progression   Progression Continue to progress workloads to maintain intensity without signs/symptoms of physical distress.   Average METs 2.33     Resistance Training   Training Prescription Yes   Weight 4 lbs   Reps 10-15     Interval Training   Interval Training No     Treadmill   MPH 2.5   Grade 0.5   Minutes 15   METs 3.09     NuStep   Level 3   SPM 70   Minutes 15   METs 2     REL-XR   Level 4   Speed 34   Minutes 15   METs 1.9     Home Exercise Plan   Plans to continue exercise at Home (comment)   Frequency Add 1 additional day to program exercise sessions.   Initial Home Exercises Provided 11/03/16      History  Smoking Status  . Never Smoker  Smokeless Tobacco  . Never Used    Goals Met:  Independence with exercise equipment Exercise tolerated well No report of cardiac concerns or symptoms Strength training completed today  Goals Unmet:  Not Applicable  Comments: Pt able to follow exercise prescription today without complaint.  Will continue to monitor for progression.   Dr. Emily Filbert is Medical Director for [place] and [facility]monary Rehabilitation.

## 2016-12-01 ENCOUNTER — Encounter: Payer: Medicare Other | Admitting: *Deleted

## 2016-12-01 DIAGNOSIS — I509 Heart failure, unspecified: Secondary | ICD-10-CM | POA: Diagnosis not present

## 2016-12-01 DIAGNOSIS — I5032 Chronic diastolic (congestive) heart failure: Secondary | ICD-10-CM

## 2016-12-01 NOTE — Progress Notes (Signed)
Daily Session Note  Patient Details  Name: Sierra Kaiser MRN: [id number] Date of Birth: 12/02/1933 Referring Provider:     Pulmonary Rehab from 10/09/2016 in [facility] Cardiac and Pulmonary Rehab  Referring Provider  [name] MD      Encounter Date: 12/01/2016  Check In:     Session Check In - 12/01/16 1151      Check-In   Location [facility]-Cardiac & Pulmonary Rehab   Staff Present Darel Hong, RN BSN;Joseph Alcus Dad, RN BSN   Supervising physician immediately available to respond to emergencies See telemetry face sheet for immediately available MD   Physician(s) Dr. Reita Cliche and [place]   Medication changes reported     No   Fall or balance concerns reported    No   Tobacco Cessation No Change   Warm-up and Cool-down Performed as group-led instruction   Resistance Training Performed Yes   VAD Patient? No     Pain Assessment   Currently in Pain? No/denies   Multiple Pain Sites No         History  Smoking Status  . Never Smoker  Smokeless Tobacco  . Never Used    Goals Met:  Proper associated with RPD/PD & O2 Sat Improved SOB with ADL's Using PLB without cueing & demonstrates good technique Exercise tolerated well Strength training completed today  Goals Unmet:  Not Applicable  Comments: Pt able to follow exercise prescription today without complaint.  Will continue to monitor for progression.    Dr. Emily Filbert is Medical Director for [place] and [facility].

## 2016-12-04 ENCOUNTER — Encounter: Payer: Medicare Other | Attending: Internal Medicine

## 2016-12-04 DIAGNOSIS — I509 Heart failure, unspecified: Secondary | ICD-10-CM | POA: Insufficient documentation

## 2016-12-04 DIAGNOSIS — I5032 Chronic diastolic (congestive) heart failure: Secondary | ICD-10-CM

## 2016-12-04 NOTE — Progress Notes (Signed)
Daily Session Note  Patient Details  Name: Sierra Kaiser MRN: [id number] Date of Birth: [birth year] Referring Provider:     Pulmonary Rehab from 10/09/2016 in [facility] Cardiac and Pulmonary Rehab  Referring Provider  [name] MD      Encounter Date: 12/04/2016  Check In:     Session Check In - 12/04/16 1154      Check-In   Location [facility]-Cardiac & Pulmonary Rehab   Staff Present Nada Maclachlan, BA, ACSM CEP, Exercise Physiologist;Kelly Amedeo Plenty, BS, ACSM CEP, Exercise Physiologist;Encarnacion Scioneaux Flavia Shipper   Supervising physician immediately available to respond to emergencies [facility] immediately available ER MD   Physician(s) Dr. Alfred Levins and Mariea Clonts   Medication changes reported     No   Fall or balance concerns reported    No   Tobacco Cessation No Change   Warm-up and Cool-down Performed as group-led instruction   Resistance Training Performed Yes   VAD Patient? No     Pain Assessment   Currently in Pain? No/denies   Multiple Pain Sites No         History  Smoking Status  . Never Smoker  Smokeless Tobacco  . Never Used    Goals Met:  Independence with exercise equipment Exercise tolerated well No report of cardiac concerns or symptoms Strength training completed today  Goals Unmet:  Not Applicable  Comments: Pt able to follow exercise prescription today without complaint.  Will continue to monitor for progression.   Dr. Emily Filbert is Medical Director for [place] and [facility].

## 2016-12-06 ENCOUNTER — Encounter: Payer: Medicare Other | Admitting: *Deleted

## 2016-12-06 DIAGNOSIS — I509 Heart failure, unspecified: Secondary | ICD-10-CM | POA: Diagnosis not present

## 2016-12-06 DIAGNOSIS — I5032 Chronic diastolic (congestive) heart failure: Secondary | ICD-10-CM

## 2016-12-06 NOTE — Progress Notes (Signed)
Daily Session Note  Patient Details  Name: Sierra Kaiser MRN: [id number] Date of Birth: [birth year] Referring Provider:     Pulmonary Rehab from 10/09/2016 in [facility] Cardiac and Pulmonary Rehab  Referring Provider  [name] MD      Encounter Date: 12/06/2016  Check In:     Session Check In - 12/06/16 1147      Check-In   Location [facility]-Cardiac & Pulmonary Rehab   Staff Present Renita Papa, RN BSN;Joseph Darrin Nipper, [place], ACSM RCEP, Exercise Physiologist   Supervising physician immediately available to respond to emergencies [facility] immediately available ER MD   Physician(s) Dr. Clearnce Hasten and Jimmye Norman   Medication changes reported     No   Fall or balance concerns reported    No   Warm-up and Cool-down Performed as group-led instruction   Resistance Training Performed Yes   VAD Patient? No     Pain Assessment   Currently in Pain? No/denies         History  Smoking Status  . Never Smoker  Smokeless Tobacco  . Never Used    Goals Met:  Proper associated with RPD/PD & O2 Sat Independence with exercise equipment Using PLB without cueing & demonstrates good technique Exercise tolerated well Strength training completed today  Goals Unmet:  Not Applicable  Comments: Pt able to follow exercise prescription today without complaint.  Will continue to monitor for progression.   Dr. Emily Filbert is Medical Director for [place] and [facility].

## 2016-12-08 ENCOUNTER — Encounter: Payer: Medicare Other | Admitting: *Deleted

## 2016-12-08 DIAGNOSIS — I5032 Chronic diastolic (congestive) heart failure: Secondary | ICD-10-CM

## 2016-12-08 DIAGNOSIS — I509 Heart failure, unspecified: Secondary | ICD-10-CM | POA: Diagnosis not present

## 2016-12-08 NOTE — Progress Notes (Signed)
Daily Session Note  Patient Details  Name: Sierra Kaiser MRN: [id number] Date of Birth: May 09, 1933 Referring Provider:     Pulmonary Rehab from 10/09/2016 in [facility] Cardiac and Pulmonary Rehab  Referring Provider  [name] MD      Encounter Date: 12/08/2016  Check In:     Session Check In - 12/08/16 1127      Check-In   Location [facility]-Cardiac & Pulmonary Rehab   Staff Present Renita Papa, RN BSN;Joseph Darrin Nipper, [place], ACSM RCEP, Exercise Physiologist   Supervising physician immediately available to respond to emergencies [facility] immediately available ER MD   Physician(s) Dr. Corky Downs and Reita Cliche   Medication changes reported     No   Fall or balance concerns reported    No   Warm-up and Cool-down Performed as group-led instruction   Resistance Training Performed Yes   VAD Patient? No     Pain Assessment   Currently in Pain? No/denies         History  Smoking Status  . Never Smoker  Smokeless Tobacco  . Never Used    Goals Met:  Proper associated with RPD/PD & O2 Sat Independence with exercise equipment Using PLB without cueing & demonstrates good technique Exercise tolerated well Strength training completed today  Goals Unmet:  Not Applicable  Comments: Pt able to follow exercise prescription today without complaint.  Will continue to monitor for progression.    Dr. Emily Filbert is Medical Director for [place] and [facility].

## 2016-12-11 ENCOUNTER — Telehealth: Payer: Self-pay

## 2016-12-11 NOTE — Telephone Encounter (Signed)
Sierra Kaiser called today to state she was not feeling well and would not be able to attend [facility] today.

## 2016-12-13 ENCOUNTER — Telehealth: Payer: Self-pay | Admitting: *Deleted

## 2016-12-13 ENCOUNTER — Encounter: Payer: Self-pay | Admitting: *Deleted

## 2016-12-13 DIAGNOSIS — I5032 Chronic diastolic (congestive) heart failure: Secondary | ICD-10-CM

## 2016-12-13 NOTE — Telephone Encounter (Signed)
Arnella called to let us know that her brother in law has passed away and that she will be out with her husband.  Her husband other brother and sister are also in the hospital and not doing well so she may out on Friday as well.  She hopes to return on Monday.

## 2016-12-18 DIAGNOSIS — I509 Heart failure, unspecified: Secondary | ICD-10-CM | POA: Diagnosis not present

## 2016-12-18 DIAGNOSIS — I5032 Chronic diastolic (congestive) heart failure: Secondary | ICD-10-CM

## 2016-12-18 NOTE — Progress Notes (Signed)
Daily Session Note  Patient Details  Name: Sierra Kaiser MRN: [id number] Date of Birth: 06-Jul-1933 Referring Provider:     Pulmonary Rehab from 10/09/2016 in [facility] Cardiac and Pulmonary Rehab  Referring Provider  [name] MD      Encounter Date: 12/18/2016  Check In:     Session Check In - 12/18/16 1123      Check-In   Location [facility]-Cardiac & Pulmonary Rehab   Staff Present Earlean Shawl, BS, ACSM CEP, Exercise Physiologist;Laureen Owens Shark, BS, RRT, Respiratory Therapist;Eria Lozoya [place] physician immediately available to respond to emergencies [facility] immediately available ER MD   Physician(s) Dr. Cherylann Banas and Corky Downs   Medication changes reported     No   Fall or balance concerns reported    No   Warm-up and Cool-down Performed as group-led instruction   Resistance Training Performed Yes   VAD Patient? No     Pain Assessment   Currently in Pain? No/denies   Multiple Pain Sites No         History  Smoking Status  . Never Smoker  Smokeless Tobacco  . Never Used    Goals Met:  Proper associated with RPD/PD & O2 Sat Independence with exercise equipment Exercise tolerated well No report of cardiac concerns or symptoms Strength training completed today  Goals Unmet:  Not Applicable  Comments: Pt able to follow exercise prescription today without complaint.  Will continue to monitor for progression.   Dr. Emily Filbert is Medical Director for [place] and [facility].

## 2016-12-20 DIAGNOSIS — I5032 Chronic diastolic (congestive) heart failure: Secondary | ICD-10-CM

## 2016-12-20 DIAGNOSIS — I509 Heart failure, unspecified: Secondary | ICD-10-CM | POA: Diagnosis not present

## 2016-12-20 NOTE — Progress Notes (Signed)
Daily Session Note  Patient Details  Name: Sierra Kaiser MRN: [id number] Date of Birth: 1933/10/19 Referring Provider:     Pulmonary Rehab from 10/09/2016 in [facility] Cardiac and Pulmonary Rehab  Referring Provider  [name] MD      Encounter Date: 12/20/2016  Check In:     Session Check In - 12/20/16 1145      Check-In   Location [facility]-Cardiac & Pulmonary Rehab   Staff Present Gerlene Burdock, RN, Geralyn Corwin, RN BSN;Kimaria Struthers Flavia Shipper   Supervising physician immediately available to respond to emergencies [facility] immediately available ER MD   Physician(s) Dr. Mariea Clonts and Joni Fears   Medication changes reported     No   Fall or balance concerns reported    No   Warm-up and Cool-down Performed as group-led instruction   Resistance Training Performed Yes   VAD Patient? No     Pain Assessment   Currently in Pain? No/denies   Multiple Pain Sites No         History  Smoking Status  . Never Smoker  Smokeless Tobacco  . Never Used    Goals Met:  Independence with exercise equipment Exercise tolerated well No report of cardiac concerns or symptoms Strength training completed today  Goals Unmet:  Not Applicable  Comments: Pt able to follow exercise prescription today without complaint.  Will continue to monitor for progression.   Dr. Emily Filbert is Medical Director for [place] and [facility].

## 2016-12-22 ENCOUNTER — Encounter: Payer: Medicare Other | Admitting: *Deleted

## 2016-12-22 DIAGNOSIS — I509 Heart failure, unspecified: Secondary | ICD-10-CM | POA: Diagnosis not present

## 2016-12-22 DIAGNOSIS — I5032 Chronic diastolic (congestive) heart failure: Secondary | ICD-10-CM

## 2016-12-22 NOTE — Progress Notes (Signed)
Daily Session Note  Patient Details  Name: Sierra Kaiser MRN: [id number] Date of Birth: 05-17-1933 Referring Provider:     Pulmonary Rehab from 10/09/2016 in [facility] Cardiac and Pulmonary Rehab  Referring Provider  [name] MD      Encounter Date: 12/22/2016  Check In:     Session Check In - 12/22/16 1142      Check-In   Location [facility]-Cardiac & Pulmonary Rehab   Staff Present Renita Papa, RN BSN;Joseph Christain [place], RN BSN   Supervising physician immediately available to respond to emergencies [facility] immediately available ER MD   Physician(s) Dr. Kerman Passey and Quentin Cornwall   Medication changes reported     No   Fall or balance concerns reported    No   Warm-up and Cool-down Performed as group-led instruction   Resistance Training Performed Yes   VAD Patient? No     Pain Assessment   Currently in Pain? No/denies         History  Smoking Status  . Never Smoker  Smokeless Tobacco  . Never Used    Goals Met:  Proper associated with RPD/PD & O2 Sat Independence with exercise equipment Using PLB without cueing & demonstrates good technique Exercise tolerated well Strength training completed today  Goals Unmet:  Not Applicable  Comments: Pt able to follow exercise prescription today without complaint.  Will continue to monitor for progression.    Dr. Emily Filbert is Medical Director for [place] and [facility].

## 2016-12-25 DIAGNOSIS — I5032 Chronic diastolic (congestive) heart failure: Secondary | ICD-10-CM

## 2016-12-25 DIAGNOSIS — I509 Heart failure, unspecified: Secondary | ICD-10-CM | POA: Diagnosis not present

## 2016-12-25 NOTE — Progress Notes (Signed)
Pulmonary Individual Treatment Plan  Patient Details  Name: ANNALYNN CENTANNI MRN: [id number] Date of Birth: [birth year] Referring Provider:     Pulmonary Rehab from 10/09/2016 in [facility] Cardiac and Pulmonary Rehab  Referring Provider  [name] MD      Initial Encounter Date:    Pulmonary Rehab from 10/09/2016 in [facility] Cardiac and Pulmonary Rehab  Date  10/09/16  Referring Provider  [name] MD      Visit Diagnosis: Chronic diastolic congestive heart failure ([place])  Patient's Home Medications on Admission:  Current Outpatient Prescriptions:  .  Biotin (BIOTIN 5000) 5 MG CAPS, Take by mouth., Disp: , Rfl:  .  diphenhydrAMINE-zinc acetate (BENADRYL) cream, Apply 1 application topically 2 (two) times daily as needed for itching., Disp: , Rfl:  .  fluticasone (FLONASE) 50 MCG/ACT nasal spray, , Disp: , Rfl:  .  furosemide (LASIX) 40 MG tablet, Take 40 mg by mouth daily., Disp: , Rfl:  .  omeprazole (PRILOSEC) 40 MG capsule, Take 1 capsule (40 mg total) by mouth daily., Disp: 90 capsule, Rfl: 3 .  Polyethyl Glycol-Propyl Glycol (SYSTANE OP), Place 1 drop into both eyes daily as needed (dry eyes)., Disp: , Rfl:  .  Rivaroxaban (XARELTO) 15 MG TABS tablet, Take 1 tablet (15 mg total) by mouth daily with supper., Disp: 90 tablet, Rfl: 3 .  rosuvastatin (CRESTOR) 20 MG tablet, TAKE 1 TABLET DAILY, Disp: 90 tablet, Rfl: 2  Past Medical History: Past Medical History:  Diagnosis Date  . Anxiety   . CAD (coronary artery disease)   . CKD stage 3 secondary to diabetes ([place])   . Esophageal reflux   . GI bleeding 2012   during EGD necessitating open surgery  . Hiatal hernia   . History of blood transfusion   . History of diverticulitis   . History of UTI   . HTN (hypertension)   . Hyperlipidemia   . Meniere's disease   . Mitral regurgitation   . Osteopenia 12/11/2015   T score -1.2 femur, -2.0 spine (12/2015)  . Permanent atrial fibrillation ([place])    a. permanent b. s/p PVI RFA at [facility]  10/14 c. failed amio (neuro toxicity) and Tikosyn d. 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 PUD (peptic ulcer disease)   . Raynaud's disease   . SNHL (sensorineural hearing loss)    right ear  . Syncopal episodes   . Tinnitus   . Ulcerative colitis ([place])    per prior records  . Urinary incontinence, urge     Tobacco Use: History  Smoking Status  . Never Smoker  Smokeless Tobacco  . Never Used    Labs: Recent Review Flowsheet Data    Labs for ITP Cardiac and Pulmonary Rehab Latest Ref Rng & Units 03/26/2014 10/07/2015 08/15/2016   Cholestrol 100 - 199 mg/dL 293(A) 203(H) 159   LDLCALC 0 - 99 mg/dL 206 136(H) 73   HDL >39 mg/dL 45 42.50 57   Trlycerides 0 - 149 mg/dL 210(A) 125.0 143       Pulmonary Assessment Scores:     Pulmonary Assessment Scores    Row Name 10/09/16 1446 11/24/16 1232       ADL UCSD   ADL Phase Entry Mid    SOB Score total 63 45    Rest 0 0    Walk 2 2    Stairs 4 3    Bath 4 2    Dress 3 2  Shop 3 2      CAT Score   CAT Score 22  -      mMRC Score   mMRC Score 2  Simultaneous filing. User may not have seen previous data.  -       Pulmonary Function Assessment:     Pulmonary Function Assessment - 10/09/16 1559      Breath   Bilateral Breath Sounds Clear   Shortness of Breath Fear of Shortness of Breath      Exercise Target Goals:    Exercise Program Goal: Individual exercise prescription set with THRR, safety & activity barriers. Participant demonstrates ability to understand and report RPE using BORG scale, to self-measure pulse accurately, and to acknowledge the importance of the exercise prescription.  Exercise Prescription Goal: Starting with aerobic activity 30 plus minutes a day, 3 days per week for initial exercise prescription. Provide home exercise prescription and guidelines that participant acknowledges understanding prior to discharge.  Activity Barriers & Risk  Stratification:     Activity Barriers & Cardiac Risk Stratification - 10/09/16 1553      Activity Barriers & Cardiac Risk Stratification   Activity Barriers Deconditioning;Muscular Weakness;Shortness of Breath      6 Minute Walk:     6 Minute Walk    Row Name 10/09/16 1551         6 Minute Walk   Phase Initial     Distance 1250 feet     Walk Time 6 minutes     # of Rest Breaks 0     MPH 2.37     METS 2.72     RPE 12     Perceived Dyspnea  2     VO2 Peak 9.5     Symptoms Yes (comment)     Comments SOB,  Right sided chest pain 2/10     Resting HR 73 bpm     Resting BP 146/64     Max Ex. HR 96 bpm     Max Ex. BP 166/64     2 Minute Post BP 132/66       Interval HR   Baseline HR (retired) 73     1 Minute HR 94     2 Minute HR 96     3 Minute HR 95     4 Minute HR 94     5 Minute HR 94     6 Minute HR 94     2 Minute Post HR 74     Interval Heart Rate? Yes       Interval Oxygen   Interval Oxygen? Yes     Baseline Oxygen Saturation % 96 %     Resting Liters of Oxygen 0 L  Room Air     1 Minute Oxygen Saturation % 97 %     1 Minute Liters of Oxygen 0 L     2 Minute Oxygen Saturation % 96 %     2 Minute Liters of Oxygen 0 L     3 Minute Oxygen Saturation % 97 %     3 Minute Liters of Oxygen 0 L     4 Minute Oxygen Saturation % 97 %     4 Minute Liters of Oxygen 0 L     5 Minute Oxygen Saturation % 97 %     5 Minute Liters of Oxygen 0 L     6 Minute Oxygen Saturation % 97 %     6 Minute Liters  of Oxygen 0 L     2 Minute Post Oxygen Saturation % 99 %     2 Minute Post Liters of Oxygen 0 L       Oxygen Initial Assessment:     Oxygen Initial Assessment - 10/09/16 1450      Home Oxygen   Home Oxygen Device None   Sleep Oxygen Prescription None   Home Exercise Oxygen Prescription None   Home at Rest Exercise Oxygen Prescription None     Intervention   Short Term Goals To learn and understand importance of monitoring SPO2 with pulse oximeter and  demonstrate accurate use of the pulse oximeter.;To Learn and understand importance of maintaining oxygen saturations>88%;To learn and demonstrate proper purse lipped breathing techniques or other breathing techniques.  does not have any respiratory medications   Long  Term Goals Maintenance of O2 saturations>88%;Exhibits proper breathing techniques, such as purse lipped breathing or other method taught during program session;Verbalizes importance of monitoring SPO2 with pulse oximeter and return demonstration      Oxygen Re-Evaluation:     Oxygen Re-Evaluation    Row Name 11/15/16 1210 12/20/16 1526           Program Oxygen Prescription   Program Oxygen Prescription None None        Home Oxygen   Home Oxygen Device None None      Sleep Oxygen Prescription None None      Home Exercise Oxygen Prescription None None      Home at Rest Exercise Oxygen Prescription None None        Goals/Expected Outcomes   Short Term Goals To learn and understand importance of monitoring SPO2 with pulse oximeter and demonstrate accurate use of the pulse oximeter.;To learn and understand importance of maintaining oxygen saturations>88%;To learn and demonstrate proper pursed lip breathing techniques or other breathing techniques. To learn and understand importance of maintaining oxygen saturations>88%;To learn and demonstrate proper pursed lip breathing techniques or other breathing techniques.;To learn and understand importance of monitoring SPO2 with pulse oximeter and demonstrate accurate use of the pulse oximeter.      Long  Term Goals Verbalizes importance of monitoring SPO2 with pulse oximeter and return demonstration;Maintenance of O2 saturations>88%;Exhibits proper breathing techniques, such as pursed lip breathing or other method taught during program session;Compliance with respiratory medication Exhibits proper breathing techniques, such as pursed lip breathing or other method taught during program  session;Verbalizes importance of monitoring SPO2 with pulse oximeter and return demonstration;Maintenance of O2 saturations>88%      Comments [employer] like somedays she has improved and other days she feels like she has not. Her oxygen has been above 88 percent and has never dropped below 92 percent. She has practiced PLB breathing at home a little bit. Informed her to het a pulse oximeter to check her oxygen. Teretha states that her breathng has improved and increased her socialization. She can now walk up the stairs at church more efficiently. Her oxygen never falls below 88 percent when exercising. She does not use PLB in class unless informed to.      Goals/Expected Outcomes Short: work on PLB techniques. Obtain a pulse oximeter.  Long: Be proficient in PLB. Be proficient in checking her oxygen independently. Short: Work on increasing exercise levels to increase ADL's. Long: Maintain exercise to increase ADL         Oxygen Discharge (Final Oxygen Re-Evaluation):     Oxygen Re-Evaluation - 12/20/16 1526      Program Oxygen  Prescription   Program Oxygen Prescription None     Home Oxygen   Home Oxygen Device None   Sleep Oxygen Prescription None   Home Exercise Oxygen Prescription None   Home at Rest Exercise Oxygen Prescription None     Goals/Expected Outcomes   Short Term Goals To learn and understand importance of maintaining oxygen saturations>88%;To learn and demonstrate proper pursed lip breathing techniques or other breathing techniques.;To learn and understand importance of monitoring SPO2 with pulse oximeter and demonstrate accurate use of the pulse oximeter.   Long  Term Goals Exhibits proper breathing techniques, such as pursed lip breathing or other method taught during program session;Verbalizes importance of monitoring SPO2 with pulse oximeter and return demonstration;Maintenance of O2 saturations>88%   Comments Ramah states that her breathng has improved and increased her  socialization. She can now walk up the stairs at church more efficiently. Her oxygen never falls below 88 percent when exercising. She does not use PLB in class unless informed to.   Goals/Expected Outcomes Short: Work on increasing exercise levels to increase ADL's. Long: Maintain exercise to increase ADL      Initial Exercise Prescription:     Initial Exercise Prescription - 10/09/16 1500      Date of Initial Exercise RX and Referring Provider   Date 10/09/16   Referring Provider [name] MD     Treadmill   MPH 2.2   Grade 0   Minutes 15   METs 2.68     NuStep   Level 2   SPM 80   Minutes 15   METs 2     REL-XR   Level 1   Speed 50   Minutes 15   METs 2     Prescription Details   Frequency (times per week) 3   Duration Progress to 45 minutes of aerobic exercise without signs/symptoms of physical distress     Intensity   THRR 40-80% of Max Heartrate 99-126   Ratings of Perceived Exertion 11-13   Perceived Dyspnea 0-4     Progression   Progression Continue to progress workloads to maintain intensity without signs/symptoms of physical distress.     Resistance Training   Training Prescription Yes   Weight 3 lbs   Reps 10-15      Perform Capillary Blood Glucose checks as needed.  Exercise Prescription Changes:     Exercise Prescription Changes    Row Name 10/09/16 1500 10/17/16 1500 10/27/16 1300 11/01/16 1500 11/03/16 1200     Response to Exercise   Blood Pressure (Admit) 146/64 102/62  - 136/70  -   Blood Pressure (Exercise) 166/64 146/50  -  -  -   Blood Pressure (Exit) 132/66 130/62  - 106/70  -   Heart Rate (Admit) 73 bpm 76 bpm  - 82 bpm  -   Heart Rate (Exercise) 96 bpm 77 bpm  - 93 bpm  -   Heart Rate (Exit) 74 bpm 74 bpm  - 51 bpm  -   Oxygen Saturation (Admit) 96 % 96 %  - 97 %  -   Oxygen Saturation (Exercise) 96 % 99 %  - 95 %  -   Oxygen Saturation (Exit) 99 % 99 %  - 96 %  -   Rating of Perceived Exertion (Exercise) 12 15  - 13  -    Perceived Dyspnea (Exercise) 2 3  - 2  -   Symptoms SOB, Right sided chest pain 2/10  -  -  none  -   Comments walk test results  -  -  -  -   Duration  - Continue with 45 min of aerobic exercise without signs/symptoms of physical distress.  - Continue with 45 min of aerobic exercise without signs/symptoms of physical distress.  -   Intensity  - THRR unchanged  - THRR unchanged  -     Progression   Progression  - Continue to progress workloads to maintain intensity without signs/symptoms of physical distress.  - Continue to progress workloads to maintain intensity without signs/symptoms of physical distress.  -   Average METs  - 2.16  - 2.33  -     Resistance Training   Training Prescription  - Yes  - Yes  -   Weight  - 3 lbs  - 3 lbs  -   Reps  - 10-15  - 10-15  -     Interval Training   Interval Training  - No  - No  -     Treadmill   MPH  - 2.2  - 2.2  -   Grade  - 0  - 0  -   Minutes  - 15  - 15  -   METs  - 2.68  - 2.68  -     NuStep   Level  - 2  - 2  -   SPM  - 76  - 77  -   Minutes  - 15  - 15  -   METs  - 2.1  - 1.98  -     REL-XR   Level  - 1  - 3  -   Speed  - 50  - 40  -   Minutes  - 15  - 15  -   METs  - 1.7  - 2.4  -     Home Exercise Plan   Plans to continue exercise at  -  - Home (comment)  walking at home Home (comment)  walking at home Home (comment)   Frequency  -  - Add 1 additional day to program exercise sessions. Add 1 additional day to program exercise sessions. Add 1 additional day to program exercise sessions.   Initial Home Exercises Provided  -  - 10/27/16 10/27/16 11/03/16   Row Name 11/14/16 1300 11/28/16 1400 12/12/16 1400         Response to Exercise   Blood Pressure (Admit) 110/62 120/70 128/64     Blood Pressure (Exit) 102/56 122/70 126/64     Heart Rate (Admit) 79 bpm 73 bpm 96 bpm     Heart Rate (Exercise) 91 bpm 97 bpm 82 bpm     Heart Rate (Exit) 68 bpm 70 bpm 82 bpm     Oxygen Saturation (Admit) 99 % 97 % 97 %     Oxygen  Saturation (Exercise) 99 % 96 % 99 %     Oxygen Saturation (Exit) 97 % 97 % 99 %     Rating of Perceived Exertion (Exercise) '13 14 12     '$ Perceived Dyspnea (Exercise) '2 3 2     '$ Symptoms none none none     Duration Continue with 45 min of aerobic exercise without signs/symptoms of physical distress. Continue with 45 min of aerobic exercise without signs/symptoms of physical distress. Continue with 45 min of aerobic exercise without signs/symptoms of physical distress.     Intensity THRR unchanged THRR unchanged THRR unchanged  Progression   Progression Continue to progress workloads to maintain intensity without signs/symptoms of physical distress. Continue to progress workloads to maintain intensity without signs/symptoms of physical distress. Continue to progress workloads to maintain intensity without signs/symptoms of physical distress.     Average METs 2.27 2.33 2.64       Resistance Training   Training Prescription Yes Yes Yes     Weight 4 lbs 4 lbs 4 lbs     Reps 10-15 10-15 10-15       Interval Training   Interval Training No No No       Treadmill   MPH 2.2 2.5 2.5     Grade 0 0.5 0     Minutes '15 15 15     '$ METs 2.68 3.09 2.91       NuStep   Level '3 3 5     '$ SPM 88 70 80     Minutes '15 15 15     '$ METs 2.3 2 2.9       REL-XR   Level '3 4 3     '$ Speed 39 34 58     Minutes '15 15 15     '$ METs 1.9 1.9 2.1       Home Exercise Plan   Plans to continue exercise at Home (comment) Home (comment) Home (comment)     Frequency Add 1 additional day to program exercise sessions. Add 1 additional day to program exercise sessions. Add 2 additional days to program exercise sessions.     Initial Home Exercises Provided 11/03/16 11/03/16 11/03/16        Exercise Comments:     Exercise Comments    Row Name 10/11/16 1244           Exercise Comments First full day of exercise!  Patient was oriented to gym and equipment including functions, settings, policies, and procedures.   Patient's individual exercise prescription and treatment plan were reviewed.  All starting workloads were established based on the results of the 6 minute walk test done at initial orientation visit.  The plan for exercise progression was also introduced and progression will be customized based on patient's performance and goals.          Exercise Goals and Review:     Exercise Goals    Row Name 10/09/16 1555             Exercise Goals   Increase Physical Activity Yes       Intervention Provide advice, education, support and counseling about physical activity/exercise needs.;Develop an individualized exercise prescription for aerobic and resistive training based on initial evaluation findings, risk stratification, comorbidities and participant's personal goals.       Expected Outcomes Achievement of increased cardiorespiratory fitness and enhanced flexibility, muscular endurance and strength shown through measurements of functional capacity and personal statement of participant.       Increase Strength and Stamina Yes       Intervention Provide advice, education, support and counseling about physical activity/exercise needs.;Develop an individualized exercise prescription for aerobic and resistive training based on initial evaluation findings, risk stratification, comorbidities and participant's personal goals.       Expected Outcomes Achievement of increased cardiorespiratory fitness and enhanced flexibility, muscular endurance and strength shown through measurements of functional capacity and personal statement of participant.          Exercise Goals Re-Evaluation :     Exercise Goals Re-Evaluation    Row Name 10/17/16 1511 10/27/16 1346  11/01/16 1506 11/03/16 1230 11/14/16 1351     Exercise Goal Re-Evaluation   Exercise Goals Review Increase Physical Activity;Increase Strenth and Stamina Increase Physical Activity;Increase Strenth and Stamina Increase Physical Activity;Increase  Strength and Stamina Increase Physical Activity;Increase Strength and Stamina;Knowledge and understanding of Target Heart Rate Range (THRR);Able to check pulse independently;Understanding of Exercise Prescription Increase Physical Activity;Increase Strength and Stamina   Comments Maleka is off to a good start in rehab.  She has already completed 3 full days of exercise!!  We will continue to monitor her progression.  Reviewed home exercise with pt today.  Pt plans to by walking at home for exercise.  Reviewed THR, pulse, RPE, sign and symptoms, NTG use, and when to call 911 or MD.  Also discussed weather considerations and indoor options.  Pt voiced understanding. Sienna continues to do well in rehab. She is now up to level 3 on the recumbent elliptical!  She continues to make improvements and wants to work hard.  We will continue to monitor her progress.  Reviewed home exercise with pt today.  Pt plans to walk at home and use weights for exercise.  Reviewed THR, pulse, RPE, sign and symptoms, and when to call 911 or MD.  Also discussed weather considerations and indoor options.  Also discussed importance of purchasing a pulse oximeter to monitor oxygen saturations at home and keep above 88%.  Pt voiced understanding. Yamilex has been doing well in rehab.  She is now up to level 3 on the recumbent ellipitcal and NuStep.  She continues to bounce during stretching and needs constant reminding to stretch slowly.  We will continue to monitor her progress.    Expected Outcomes Short: Continue to attend rehab classes.  Long: Make exercise part of daily routine.  Short: add walking to home exercise and continue exercising in class. Long: Continue walking and adding home exercise post [facility] Short: Continue to walk more at home.  Long: Continue to work on Scientist, research (life sciences).  Short: Add in at least one extra day at home for walking or join grandson's gym.  Long: Make exercise part of routine.  Short: Try to  stretch better in class.  Long: Continue work on Education administrator.   [place] Name 11/28/16 1453 12/12/16 1405           Exercise Goal Re-Evaluation   Exercise Goals Review Increase Physical Activity;Increase Strength and Stamina Increase Physical Activity;Increase Strength and Stamina      Comments Catina continues to do well in rehab.  She is now doing level 4 on the recumbent elliptical.  She still need reminding to follow along with class.  We will continue to monitor her progress.  Demetri has been doing well in rehab.  She has worked her way up to level 5 on te NuStep.  She has fallen back down to level 1 on the recumbent elliptical and we will talk to her about moving back up.  We will continue to monitor her progres.       Expected Outcomes Short: Continue to work on stretches.  Long: Continue to exercise some at home. Short: Move recumbent elliptical back up.  Long: Continue to work on Printmaker and stamina.          Discharge Exercise Prescription (Final Exercise Prescription Changes):     Exercise Prescription Changes - 12/12/16 1400      Response to Exercise   Blood Pressure (Admit) 128/64   Blood Pressure (Exit) 126/64  Heart Rate (Admit) 96 bpm   Heart Rate (Exercise) 82 bpm   Heart Rate (Exit) 82 bpm   Oxygen Saturation (Admit) 97 %   Oxygen Saturation (Exercise) 99 %   Oxygen Saturation (Exit) 99 %   Rating of Perceived Exertion (Exercise) 12   Perceived Dyspnea (Exercise) 2   Symptoms none   Duration Continue with 45 min of aerobic exercise without signs/symptoms of physical distress.   Intensity THRR unchanged     Progression   Progression Continue to progress workloads to maintain intensity without signs/symptoms of physical distress.   Average METs 2.64     Resistance Training   Training Prescription Yes   Weight 4 lbs   Reps 10-15     Interval Training   Interval Training No     Treadmill   MPH 2.5   Grade 0   Minutes 15   METs  2.91     NuStep   Level 5   SPM 80   Minutes 15   METs 2.9     REL-XR   Level 3   Speed 58   Minutes 15   METs 2.1     Home Exercise Plan   Plans to continue exercise at Home (comment)   Frequency Add 2 additional days to program exercise sessions.   Initial Home Exercises Provided 11/03/16      Nutrition:  Target Goals: Understanding of nutrition guidelines, daily intake of sodium '1500mg'$ , cholesterol '200mg'$ , calories 30% from fat and 7% or less from saturated fats, daily to have 5 or more servings of fruits and vegetables.  Biometrics:     Pre Biometrics - 10/09/16 1556      Pre Biometrics   Height 5' 7.5" (1.715 m)   Weight 146 lb 12.8 oz (66.6 kg)   Waist Circumference 31 inches   Hip Circumference 36.5 inches   Waist to Hip Ratio 0.85 %   BMI (Calculated) 22.7       Nutrition Therapy Plan and Nutrition Goals:     Nutrition Therapy & Goals - 12/20/16 1517      Nutrition Therapy   RD appointment defered Yes      Nutrition Discharge: Rate Your Plate Scores:     Nutrition Assessments - 10/09/16 1547      MEDFICTS Scores   Pre Score 103      Nutrition Goals Re-Evaluation:     Nutrition Goals Re-Evaluation    Row Name 11/15/16 1218 12/20/16 1517           Goals   Current Weight 149 lb (67.6 kg) 146 lb 11.2 oz (66.5 kg)      Nutrition Goal Maintain current weight Maintain current weight and eat a healthy diet.      Comment To maintain her weight she eats healthy. She does not eat fried foods. Valita eats veggitables, does not drink soda and drinks a fair amount water. Isolde eats a healthy diet and maintains her weight well. She cooks more at home than she does going out.      Expected Outcome Short: Maintain current weight and eat a healthy diet. Long: Increase water intake. Eat a heart healthy diet daily. Short: eat a healthy diet. Long: Maintain curret weight post [facility].         Nutrition Goals Discharge (Final Nutrition Goals  Re-Evaluation):     Nutrition Goals Re-Evaluation - 12/20/16 1517      Goals   Current Weight 146 lb 11.2 oz (66.5 kg)  Nutrition Goal Maintain current weight and eat a healthy diet.   Comment Laraine eats a healthy diet and maintains her weight well. She cooks more at home than she does going out.   Expected Outcome Short: eat a healthy diet. Long: Maintain curret weight post [facility].      Psychosocial: Target Goals: Acknowledge presence or absence of significant depression and/or stress, maximize coping skills, provide positive support system. Participant is able to verbalize types and ability to use techniques and skills needed for reducing stress and depression.   Initial Review & Psychosocial Screening:   Quality of Life Scores:   PHQ-9: Recent Review Flowsheet Data    Depression screen [facility] 2/9 11/24/2016 10/09/2016 10/07/2015   Decreased Interest 2 3 0   Down, Depressed, Hopeless 0 1 0   PHQ - 2 Score 2 4 0   Altered sleeping 1 1 -   Tired, decreased energy 2 3 -   Change in appetite 2 0 -   Feeling bad or failure about yourself  1 2 -   Trouble concentrating 1 1 -   Moving slowly or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 Name 12/25/16 0839           ITP  Comments 30 day review completed. ITP sent to Dr. Emily Filbert Director of [place]. Continue with ITP unless changes are made by physician.            Comments: 30 day review

## 2016-12-25 NOTE — Progress Notes (Signed)
Daily Session Note  Patient Details  Name: Sierra Kaiser MRN: [id number] Date of Birth: [birth year] Referring Provider:     Pulmonary Rehab from 10/09/2016 in [facility] Cardiac and Pulmonary Rehab  Referring Provider  [name] MD      Encounter Date: 12/25/2016  Check In:     Session Check In - 12/25/16 1141      Check-In   Location [facility]-Cardiac & Pulmonary Rehab   Staff Present Nada Maclachlan, BA, ACSM CEP, Exercise Physiologist;Kelly Amedeo Plenty, BS, ACSM CEP, Exercise Physiologist;Tia Gelb Flavia Shipper   Supervising physician immediately available to respond to emergencies [facility] immediately available ER MD   Physician(s) Dr. Kerman Passey and Mariea Clonts   Medication changes reported     No   Fall or balance concerns reported    No   Warm-up and Cool-down Performed as group-led instruction   Resistance Training Performed Yes   VAD Patient? No     Pain Assessment   Currently in Pain? No/denies   Multiple Pain Sites No         History  Smoking Status  . Never Smoker  Smokeless Tobacco  . Never Used    Goals Met:  Independence with exercise equipment Exercise tolerated well No report of cardiac concerns or symptoms Strength training completed today  Goals Unmet:  Not Applicable  Comments: Pt able to follow exercise prescription today without complaint.  Will continue to monitor for progression.   Dr. Emily Filbert is Medical Director for [place] and [facility].

## 2016-12-29 DIAGNOSIS — I5032 Chronic diastolic (congestive) heart failure: Secondary | ICD-10-CM

## 2016-12-29 DIAGNOSIS — I509 Heart failure, unspecified: Secondary | ICD-10-CM | POA: Diagnosis not present

## 2016-12-29 NOTE — Progress Notes (Signed)
Daily Session Note  Patient Details  Name: Sierra Kaiser MRN: [id number] Date of Birth: 23-Oct-1933 Referring Provider:     Pulmonary Rehab from 10/09/2016 in [facility] Cardiac and Pulmonary Rehab  Referring Provider  [name] MD      Encounter Date: 12/29/2016  Check In:     Session Check In - 12/29/16 1143      Check-In   Location [facility]-Cardiac & Pulmonary Rehab   Staff Present Alberteen Sam, MA, ACSM RCEP, Exercise Physiologist;Amor Packard Alcus Dad, RN BSN   Supervising physician immediately available to respond to emergencies [facility] immediately available ER MD   Physician(s) Dr. Alfred Levins and [place].   Medication changes reported     No   Fall or balance concerns reported    No   Warm-up and Cool-down Performed as group-led instruction   Resistance Training Performed Yes   VAD Patient? No     Pain Assessment   Currently in Pain? No/denies         History  Smoking Status  . Never Smoker  Smokeless Tobacco  . Never Used    Goals Met:  Proper associated with RPD/PD & O2 Sat Independence with exercise equipment Exercise tolerated well No report of cardiac concerns or symptoms Strength training completed today  Goals Unmet:  Not Applicable  Comments: Pt able to follow exercise prescription today without complaint.  Will continue to monitor for progression.   Dr. Emily Filbert is Medical Director for [place] and [facility].

## 2017-01-01 DIAGNOSIS — I509 Heart failure, unspecified: Secondary | ICD-10-CM | POA: Diagnosis not present

## 2017-01-01 DIAGNOSIS — I5032 Chronic diastolic (congestive) heart failure: Secondary | ICD-10-CM

## 2017-01-01 NOTE — Progress Notes (Signed)
Daily Session Note  Patient Details  Name: Sierra Kaiser MRN: [id number] Date of Birth: Jul 20, 1933 Referring Provider:     Pulmonary Rehab from 10/09/2016 in [facility] Cardiac and Pulmonary Rehab  Referring Provider  [name] MD      Encounter Date: 01/01/2017  Check In:     Session Check In - 01/01/17 1157      Check-In   Location [facility]-Cardiac & Pulmonary Rehab   Staff Present Nada Maclachlan, BA, ACSM CEP, Exercise Physiologist;Kelly Amedeo Plenty, BS, ACSM CEP, Exercise Physiologist;Joseph Flavia Shipper   Supervising physician immediately available to respond to emergencies [facility] immediately available ER MD   Physician(s) Dr. Cinda Quest and Reita Cliche   Medication changes reported     No   Fall or balance concerns reported    No   Warm-up and Cool-down Performed as group-led instruction   Resistance Training Performed Yes   VAD Patient? No     Pain Assessment   Currently in Pain? No/denies         History  Smoking Status  . Never Smoker  Smokeless Tobacco  . Never Used    Goals Met:  Independence with exercise equipment Exercise tolerated well No report of cardiac concerns or symptoms Strength training completed today  Goals Unmet:  Not Applicable  Comments: Pt able to follow exercise prescription today without complaint.  Will continue to monitor for progression.   Dr. Emily Filbert is Medical Director for [place] and [facility].

## 2017-01-03 ENCOUNTER — Encounter: Payer: Medicare Other | Admitting: *Deleted

## 2017-01-03 DIAGNOSIS — I5032 Chronic diastolic (congestive) heart failure: Secondary | ICD-10-CM

## 2017-01-03 DIAGNOSIS — I509 Heart failure, unspecified: Secondary | ICD-10-CM | POA: Diagnosis not present

## 2017-01-03 NOTE — Progress Notes (Signed)
Daily Session Note  Patient Details  Name: Sierra Kaiser MRN: [id number] Date of Birth: November 02, 1933 Referring Provider:     Pulmonary Rehab from 10/09/2016 in [facility] Cardiac and Pulmonary Rehab  Referring Provider  [name] MD      Encounter Date: 01/03/2017  Check In:     Session Check In - 01/03/17 1142      Check-In   Location [facility]-Cardiac & Pulmonary Rehab   Staff Present Renita Papa, RN BSN;Joseph Darrin Nipper, [place], ACSM RCEP, Exercise Physiologist   Supervising physician immediately available to respond to emergencies [facility] immediately available ER MD   Physician(s) Dr. Alfred Levins and Reita Cliche   Medication changes reported     No   Fall or balance concerns reported    No   Warm-up and Cool-down Performed as group-led instruction   Resistance Training Performed Yes   VAD Patient? No     Pain Assessment   Currently in Pain? No/denies         History  Smoking Status  . Never Smoker  Smokeless Tobacco  . Never Used    Goals Met:  Proper associated with RPD/PD & O2 Sat Independence with exercise equipment Using PLB without cueing & demonstrates good technique Exercise tolerated well Strength training completed today  Goals Unmet:  Not Applicable  Comments: Pt able to follow exercise prescription today without complaint.  Will continue to monitor for progression.    Dr. Emily Filbert is Medical Director for [place] and [facility].

## 2017-01-05 ENCOUNTER — Encounter: Payer: Medicare Other | Attending: Internal Medicine

## 2017-01-05 DIAGNOSIS — I509 Heart failure, unspecified: Secondary | ICD-10-CM | POA: Insufficient documentation

## 2017-01-08 DIAGNOSIS — I509 Heart failure, unspecified: Secondary | ICD-10-CM | POA: Diagnosis present

## 2017-01-08 DIAGNOSIS — I5032 Chronic diastolic (congestive) heart failure: Secondary | ICD-10-CM

## 2017-01-08 NOTE — Progress Notes (Signed)
Daily Session Note  Patient Details  Name: MARDEL GRUDZIEN MRN: [id number] Date of Birth: 1933-09-24 Referring Provider:     Pulmonary Rehab from 10/09/2016 in [facility] Cardiac and Pulmonary Rehab  Referring Provider  [name] MD      Encounter Date: 01/08/2017  Check In: Session Check In - 01/08/17 1152      Check-In   Location  [facility]-Cardiac & Pulmonary Rehab    Staff Present  Earlean Shawl, BS, ACSM CEP, Exercise Physiologist;Amanda Oletta Darter, BA, ACSM CEP, Exercise Physiologist;Joseph Flavia Shipper    Supervising physician immediately available to respond to emergencies  [facility] immediately available ER MD    Physician(s)  Dr. Jimmye Norman and Alfred Levins    Medication changes reported      No    Fall or balance concerns reported     No    Warm-up and Cool-down  Performed as group-led instruction    Resistance Training Performed  Yes    VAD Patient?  No      Pain Assessment   Currently in Pain?  No/denies          Social History   Tobacco Use  Smoking Status Never Smoker  Smokeless Tobacco Never Used    Goals Met:  Independence with exercise equipment Exercise tolerated well No report of cardiac concerns or symptoms Strength training completed today  Goals Unmet:  Not Applicable  Comments: Pt able to follow exercise prescription today without complaint.  Will continue to monitor for progression.   Dr. Emily Filbert is Medical Director for [place] and [facility].

## 2017-01-10 ENCOUNTER — Encounter: Payer: Medicare Other | Admitting: *Deleted

## 2017-01-10 DIAGNOSIS — I5032 Chronic diastolic (congestive) heart failure: Secondary | ICD-10-CM

## 2017-01-10 DIAGNOSIS — I509 Heart failure, unspecified: Secondary | ICD-10-CM | POA: Diagnosis not present

## 2017-01-10 NOTE — Progress Notes (Signed)
Daily Session Note  Patient Details  Name: Sierra Kaiser MRN: [id number] Date of Birth: 13-Nov-1933 Referring Provider:     Pulmonary Rehab from 10/09/2016 in [facility] Cardiac and Pulmonary Rehab  Referring Provider  [name] MD      Encounter Date: 01/10/2017  Check In: Session Check In - 01/10/17 1146      Check-In   Location  [facility]-Cardiac & Pulmonary Rehab    Staff Present  Renita Papa, RN BSN;Joseph Darrin Nipper, [place], ACSM RCEP, Exercise Physiologist    Supervising physician immediately available to respond to emergencies  [facility] immediately available ER MD    Physician(s)  Dr. Jimmye Norman and [facility]    Medication changes reported      No    Fall or balance concerns reported     No    Tobacco Cessation  No Change    Warm-up and Cool-down  Performed as group-led instruction    Resistance Training Performed  Yes    VAD Patient?  No      Pain Assessment   Currently in Pain?  No/denies        Exercise Prescription Changes - 01/09/17 1500      Response to Exercise   Blood Pressure (Admit)  124/62    Blood Pressure (Exit)  110/58    Heart Rate (Admit)  73 bpm    Heart Rate (Exercise)  85 bpm    Heart Rate (Exit)  70 bpm    Oxygen Saturation (Admit)  99 %    Oxygen Saturation (Exercise)  98 %    Oxygen Saturation (Exit)  99 %    Rating of Perceived Exertion (Exercise)  13    Perceived Dyspnea (Exercise)  3    Symptoms  none    Duration  Continue with 45 min of aerobic exercise without signs/symptoms of physical distress.    Intensity  THRR unchanged      Progression   Progression  Continue to progress workloads to maintain intensity without signs/symptoms of physical distress.    Average METs  2.62      Resistance Training   Training Prescription  Yes    Weight  4 lbs    Reps  10-15      Interval Training   Interval Training  No      Treadmill   MPH  2    Grade  0.5    Minutes  15    METs  2.67      NuStep   Level  6    SPM   68    Minutes  15    METs  2.9      REL-XR   Level  6    Speed  40    Minutes  15    METs  2.3      Home Exercise Plan   Plans to continue exercise at  Home (comment)    Frequency  Add 2 additional days to program exercise sessions.    Initial Home Exercises Provided  11/03/16       Social History   Tobacco Use  Smoking Status Never Smoker  Smokeless Tobacco Never Used    Goals Met:  Proper associated with RPD/PD & O2 Sat Independence with exercise equipment Using PLB without cueing & demonstrates good technique Exercise tolerated well Strength training completed today  Goals Unmet:  Not Applicable  Comments: Pt able to follow exercise prescription today without complaint.  Will continue to monitor for progression.  Dr. Emily Filbert is Medical Director for [place] and [facility].

## 2017-01-12 DIAGNOSIS — I5032 Chronic diastolic (congestive) heart failure: Secondary | ICD-10-CM

## 2017-01-12 DIAGNOSIS — I509 Heart failure, unspecified: Secondary | ICD-10-CM | POA: Diagnosis not present

## 2017-01-12 NOTE — Progress Notes (Signed)
Daily Session Note  Patient Details  Name: Sierra Kaiser MRN: [id number] Date of Birth: 1933-11-26 Referring Provider:     Pulmonary Rehab from 10/09/2016 in [facility] Cardiac and Pulmonary Rehab  Referring Provider  [name] MD      Encounter Date: 01/12/2017  Check In: Session Check In - 01/12/17 1152      Check-In   Location  [facility]-Cardiac & Pulmonary Rehab    Staff Present  Renita Papa, RN BSN;Darcey Cardy Darrin Nipper, [place], ACSM RCEP, Exercise Physiologist    Supervising physician immediately available to respond to emergencies  [facility] immediately available ER MD    Physician(s)  Dr. Mable Paris and [facility]    Medication changes reported      No    Fall or balance concerns reported     No    Warm-up and Cool-down  Performed as group-led instruction    Resistance Training Performed  Yes    VAD Patient?  No      Pain Assessment   Currently in Pain?  No/denies          Social History   Tobacco Use  Smoking Status Never Smoker  Smokeless Tobacco Never Used    Goals Met:  Independence with exercise equipment Exercise tolerated well No report of cardiac concerns or symptoms Strength training completed today  Goals Unmet:  Not Applicable  Comments: Pt able to follow exercise prescription today without complaint.  Will continue to monitor for progression.   Dr. Emily Filbert is Medical Director for [place] and [facility].

## 2017-01-15 DIAGNOSIS — I509 Heart failure, unspecified: Secondary | ICD-10-CM | POA: Diagnosis not present

## 2017-01-15 DIAGNOSIS — I5032 Chronic diastolic (congestive) heart failure: Secondary | ICD-10-CM

## 2017-01-15 NOTE — Progress Notes (Signed)
Daily Session Note  Patient Details  Name: Sierra Kaiser MRN: [id number] Date of Birth: 10-13-1933 Referring Provider:     Pulmonary Rehab from 10/09/2016 in [facility] Cardiac and Pulmonary Rehab  Referring Provider  [name] MD      Encounter Date: 01/15/2017  Check In: Session Check In - 01/15/17 1142      Check-In   Staff Present  Earlean Shawl, BS, ACSM CEP, Exercise Physiologist;Amanda Oletta Darter, BA, ACSM CEP, Exercise Physiologist;Chrystina Naff Flavia Shipper    Supervising physician immediately available to respond to emergencies  [facility] immediately available ER MD    Physician(s)  Dr. Burlene Arnt and Archie Balboa    Medication changes reported      No    Fall or balance concerns reported     No    Warm-up and Cool-down  Performed as group-led instruction    Resistance Training Performed  Yes    VAD Patient?  No      Pain Assessment   Currently in Pain?  No/denies          Social History   Tobacco Use  Smoking Status Never Smoker  Smokeless Tobacco Never Used    Goals Met:  Independence with exercise equipment Exercise tolerated well No report of cardiac concerns or symptoms Strength training completed today  Goals Unmet:  Not Applicable  Comments: Pt able to follow exercise prescription today without complaint.  Will continue to monitor for progression.   Dr. Emily Filbert is Medical Director for [place] and [facility].

## 2017-01-16 ENCOUNTER — Other Ambulatory Visit: Payer: Self-pay | Admitting: Family Medicine

## 2017-01-16 DIAGNOSIS — N183 Chronic kidney disease, stage 3 unspecified: Secondary | ICD-10-CM

## 2017-01-16 DIAGNOSIS — E785 Hyperlipidemia, unspecified: Secondary | ICD-10-CM

## 2017-01-16 DIAGNOSIS — D509 Iron deficiency anemia, unspecified: Secondary | ICD-10-CM

## 2017-01-17 ENCOUNTER — Ambulatory Visit (INDEPENDENT_AMBULATORY_CARE_PROVIDER_SITE_OTHER): Payer: Medicare Other

## 2017-01-17 VITALS — BP 120/70 | HR 70 | Temp 97.8°F | Ht 66.5 in | Wt 145.2 lb

## 2017-01-17 DIAGNOSIS — I5032 Chronic diastolic (congestive) heart failure: Secondary | ICD-10-CM

## 2017-01-17 DIAGNOSIS — Z0000 Encounter for general adult medical examination without abnormal findings: Secondary | ICD-10-CM

## 2017-01-17 DIAGNOSIS — N183 Chronic kidney disease, stage 3 unspecified: Secondary | ICD-10-CM

## 2017-01-17 DIAGNOSIS — D509 Iron deficiency anemia, unspecified: Secondary | ICD-10-CM | POA: Diagnosis not present

## 2017-01-17 DIAGNOSIS — E785 Hyperlipidemia, unspecified: Secondary | ICD-10-CM

## 2017-01-17 DIAGNOSIS — I509 Heart failure, unspecified: Secondary | ICD-10-CM | POA: Diagnosis not present

## 2017-01-17 LAB — RENAL FUNCTION PANEL
Albumin: 4.3 g/dL (ref 3.5–5.2)
BUN: 27 mg/dL — ABNORMAL HIGH (ref 6–23)
CO2: 27 mEq/L (ref 19–32)
Calcium: 9.9 mg/dL (ref 8.4–10.5)
Chloride: 106 mEq/L (ref 96–112)
Creatinine, Ser: 1.29 mg/dL — ABNORMAL HIGH (ref 0.40–1.20)
GFR: 42.08 mL/min — ABNORMAL LOW (ref >60.00)
Glucose, Bld: 107 mg/dL — ABNORMAL HIGH (ref 70–99)
Phosphorus: 3.8 mg/dL (ref 2.3–4.6)
Potassium: 4.9 mEq/L (ref 3.5–5.1)
Sodium: 141 mEq/L (ref 135–145)

## 2017-01-17 LAB — FERRITIN: Ferritin: 5.1 ng/mL — ABNORMAL LOW (ref 10.0–291.0)

## 2017-01-17 LAB — TSH: TSH: 2.28 u[IU]/mL (ref 0.35–4.50)

## 2017-01-17 NOTE — Progress Notes (Signed)
Daily Session Note  Patient Details  Name: CORDIE BEAZLEY MRN: [id number] Date of Birth: 15-Oct-1933 Referring Provider:     Pulmonary Rehab from 10/09/2016 in [facility] Cardiac and Pulmonary Rehab  Referring Provider  [name] MD      Encounter Date: 01/17/2017  Check In: Session Check In - 01/17/17 1127      Check-In   Location  [facility]-Cardiac & Pulmonary Rehab    Staff Present  Renita Papa, RN BSN;Presly Steinruck Darrin Nipper, [place], ACSM RCEP, Exercise Physiologist    Supervising physician immediately available to respond to emergencies  [facility] immediately available ER MD    Physician(s)  Dr. Jimmye Norman and [place]    Medication changes reported      No    Fall or balance concerns reported     No    Warm-up and Cool-down  Performed as group-led instruction    Resistance Training Performed  Yes    VAD Patient?  No      Pain Assessment   Currently in Pain?  No/denies          Social History   Tobacco Use  Smoking Status Never Smoker  Smokeless Tobacco Never Used    Goals Met:  Independence with exercise equipment Exercise tolerated well No report of cardiac concerns or symptoms Strength training completed today  Goals Unmet:  Not Applicable  Comments: Pt able to follow exercise prescription today without complaint.  Will continue to monitor for progression.   Dr. Emily Filbert is Medical Director for [place] and [facility].

## 2017-01-17 NOTE — Patient Instructions (Signed)
Sierra Kaiser , Thank you for taking time to come for your Medicare Wellness Visit. I appreciate your ongoing commitment to your health goals. Please review the following plan we discussed and let me know if I can assist you in the future.   These are the goals we discussed: Goals    Starting 01/17/2017, I will attempt to drink at least 3-4 glasses of water daily.       This is a list of the screening recommended for you and due dates:  Health Maintenance  Topic Date Due  . DTaP/Tdap/Td vaccine (1 - Tdap) 01/17/2018*  . Tetanus Vaccine  01/17/2018*  . Flu Shot  Completed  . DEXA scan (bone density measurement)  Completed  . Pneumonia vaccines  Completed  . Complete foot exam   Discontinued  . Hemoglobin A1C  Discontinued  . Eye exam for diabetics  Discontinued  . Urine Protein Check  Discontinued  *Topic was postponed. The date shown is not the original due date.   Preventive Care for Adults  A healthy lifestyle and preventive care can promote health and wellness. Preventive health guidelines for adults include the following key practices.  . A routine yearly physical is a good way to check with your health care provider about your health and preventive screening. It is a chance to share any concerns and updates on your health and to receive a thorough exam.  . Visit your dentist for a routine exam and preventive care every 6 months. Brush your teeth twice a day and floss once a day. Good oral hygiene prevents tooth decay and gum disease.  . The frequency of eye exams is based on your age, health, family medical history, use  of contact lenses, and other factors. Follow your health care provider's recommendations for frequency of eye exams.  . Eat a healthy diet. Foods like vegetables, fruits, whole grains, low-fat dairy products, and lean protein foods contain the nutrients you need without too many calories. Decrease your intake of foods high in solid fats, added sugars, and salt.  Eat the right amount of calories for you. Get information about a proper diet from your health care provider, if necessary.  . Regular physical exercise is one of the most important things you can do for your health. Most adults should get at least 150 minutes of moderate-intensity exercise (any activity that increases your heart rate and causes you to sweat) each week. In addition, most adults need muscle-strengthening exercises on 2 or more days a week.  Silver Sneakers may be a benefit available to you. To determine eligibility, you may visit the website: www.silversneakers.com or contact program at 203-313-0086 Mon-Fri between 8AM-8PM.   . Maintain a healthy weight. The body mass index (BMI) is a screening tool to identify possible weight problems. It provides an estimate of body fat based on height and weight. Your health care provider can find your BMI and can help you achieve or maintain a healthy weight.   For adults 20 years and older: ? A BMI below 18.5 is considered underweight. ? A BMI of 18.5 to 24.9 is normal. ? A BMI of 25 to 29.9 is considered overweight. ? A BMI of 30 and above is considered obese.   . Maintain normal blood lipids and cholesterol levels by exercising and minimizing your intake of saturated fat. Eat a balanced diet with plenty of fruit and vegetables. Blood tests for lipids and cholesterol should begin at age 4 and be repeated every 5  years. If your lipid or cholesterol levels are high, you are over 50, or you are at high risk for heart disease, you may need your cholesterol levels checked more frequently. Ongoing high lipid and cholesterol levels should be treated with medicines if diet and exercise are not working.  . If you smoke, find out from your health care provider how to quit. If you do not use tobacco, please do not start.  . If you choose to drink alcohol, please do not consume more than 2 drinks per day. One drink is considered to be 12 ounces (355  mL) of beer, 5 ounces (148 mL) of wine, or 1.5 ounces (44 mL) of liquor.  . If you are 55-14 years old, ask your health care provider if you should take aspirin to prevent strokes.  . Use sunscreen. Apply sunscreen liberally and repeatedly throughout the day. You should seek shade when your shadow is shorter than you. Protect yourself by wearing long sleeves, pants, a wide-brimmed hat, and sunglasses year round, whenever you are outdoors.  . Once a month, do a whole body skin exam, using a mirror to look at the skin on your back. Tell your health care provider of new moles, moles that have irregular borders, moles that are larger than a pencil eraser, or moles that have changed in shape or color.

## 2017-01-18 NOTE — Progress Notes (Signed)
PCP notes:   Health maintenance:  Tetanus - postponed/insurance  Abnormal screenings:   None  Patient concerns:   None  Nurse concerns:  None  Next PCP appt:   01/30/17 @ 1600

## 2017-01-18 NOTE — Progress Notes (Signed)
Subjective:   Sierra Kaiser is a 83 y.o. female who presents for Medicare Annual (Subsequent) preventive examination.  Review of Systems:  N/A Cardiac Risk Factors include: advanced age (>76men, >31 women);dyslipidemia     Objective:     Vitals: BP 120/70 (BP Location: Right Arm, Patient Position: Sitting, Cuff Size: Normal)   Pulse 70   Temp 97.8 F (36.6 C) (Oral)   Ht 5' 6.5" (1.689 m) Comment: no shoes  Wt 145 lb 4 oz (65.9 kg)   SpO2 97%   BMI 23.09 kg/m   Body mass index is 23.09 kg/m.   Tobacco Social History   Tobacco Use  Smoking Status Never Smoker  Smokeless Tobacco Never Used     Counseling given: No   Past Medical History:  Diagnosis Date  . Anxiety   . CAD (coronary artery disease)   . CKD stage 3 secondary to diabetes ([place])   . Esophageal reflux   . GI bleeding 2012   during EGD necessitating open surgery  . Hiatal hernia   . History of blood transfusion   . History of diverticulitis   . History of UTI   . HTN (hypertension)   . Hyperlipidemia   . Meniere's disease   . Mitral regurgitation   . Osteopenia 12/11/2015   T score -1.2 femur, -2.0 spine (12/2015)  . Permanent atrial fibrillation ([place])    a. permanent b. s/p PVI RFA at [facility] 10/14 c. failed amio (neuro toxicity) and Tikosyn d. 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 PUD (peptic ulcer disease)   . Raynaud's disease   . SNHL (sensorineural hearing loss)    right ear  . Syncopal episodes   . Tinnitus   . Ulcerative colitis ([place])    per prior records  . Urinary incontinence, urge    Past Surgical History:  Procedure Laterality Date  . APPENDECTOMY  1973  . AV NODE ABLATION N/A 05/06/2014   Procedure: AV NODE ABLATION;  Surgeon: [name], MD;  Location: [facility] CATH LAB;  Service: Cardiovascular;  Laterality: N/A;  . CARDIAC CATHETERIZATION  2014   [facility]  . CARDIAC ELECTROPHYSIOLOGY STUDY AND ABLATION    . CHOLECYSTECTOMY  2010  .  COLONOSCOPY  2010   polyps  . HEMORRHOID SURGERY    . LAPAROTOMY  2012   EGD biopsy led to bleeding - needed laparotomy to stop bleed ([facility])  . PERMANENT PACEMAKER INSERTION N/A 04/22/2014   STJ single chamber pacemaker implanted by Dr [name]  . TRANSESOPHAGEAL ECHOCARDIOGRAM WITH CARDIOVERSION  2014   [facility]  . UPPER GI ENDOSCOPY  2012   with polypectomy  . VAGINAL HYSTERECTOMY  1973   elective; ovaries remained   Family History  Problem Relation Age of Onset  . Arrhythmia Mother   . Hypertension Mother   . Diabetes Mother   . Heart failure Brother   . Heart failure Brother   . Cancer Brother        colon (with colostomy)  . Cancer Sister        breast  . Breast cancer Sister 41  . Alcohol abuse Brother   . Stroke Brother   . Diabetes Son    Social History   Substance and Sexual Activity  Sexual Activity No    Outpatient Encounter Medications as of 01/17/2017  Medication Sig  . Biotin (BIOTIN 5000) 5 MG CAPS Take by mouth.  . diphenhydrAMINE-zinc  acetate (BENADRYL) cream Apply 1 application topically 2 (two) times daily as needed for itching.  . fluticasone (FLONASE) 50 MCG/ACT nasal spray   . furosemide (LASIX) 40 MG tablet Take 40 mg by mouth daily.  Marland Kitchen omeprazole (PRILOSEC) 40 MG capsule Take 1 capsule (40 mg total) by mouth daily.  [name] Glycol-Propyl Glycol (SYSTANE OP) Place 1 drop into both eyes daily as needed (dry eyes).  . Rivaroxaban (XARELTO) 15 MG TABS tablet Take 1 tablet (15 mg total) by mouth daily with supper.  . rosuvastatin (CRESTOR) 20 MG tablet TAKE 1 TABLET DAILY   No facility-administered encounter medications on file as of 01/17/2017.     Activities of Daily Living In your present state of health, do you have any difficulty performing the following activities: 01/17/2017  Hearing? Y  Vision? N  Difficulty concentrating or making decisions? Y  Walking or climbing stairs? Y  Dressing or bathing? N  Doing  errands, shopping? N  Preparing Food and eating ? N  Using the Toilet? N  In the past six months, have you accidently leaked urine? N  Do you have problems with loss of bowel control? N  Managing your Medications? N  Managing your Finances? N  Housekeeping or managing your Housekeeping? N  Some recent data might be hidden    Patient Care Team: [name], MD as PCP - General (Family Medicine) Rockey Situ Kathlene November, MD as Consulting Physician (Cardiology)    Assessment:     Visual Acuity Screening   Right eye Left eye Both eyes  Without correction: 20/50-1 20/30 20/25  With correction:     Hearing Screening Comments: 2018 Hearing exam with ENT    Exercise Activities and Dietary recommendations Current Exercise Habits: Structured exercise class, Type of exercise: Other - see comments(cardiac rehab), Time (Minutes): > 60, Frequency (Times/Week): 3, Weekly Exercise (Minutes/Week): 0, Intensity: Moderate, Exercise limited by: None identified  Goals    Starting 01/17/2017, I will attempt to drink at least 3-4 glasses of water daily.      Fall Risk Fall Risk  01/17/2017 01/12/2017 11/24/2016 10/09/2016 10/07/2015  Falls in the past year? No No No No No   Depression Screen PHQ 2/9 Scores 01/17/2017 01/12/2017 11/24/2016 10/09/2016  PHQ - 2 Score 0 0 2 4  PHQ- 9 Score 0 4 11 13      Cognitive Function MMSE - Mini Mental State Exam 01/17/2017 10/07/2015  Orientation to time 5 5  Orientation to Place 5 5  Registration 3 3  Attention/ Calculation 0 0  Recall 3 3  Language- name 2 objects 0 0  Language- repeat 1 1  Language- follow 3 step command 3 3  Language- read & follow direction 0 0  Write a sentence 0 0  Copy design 0 0  Total score 20 20     PLEASE NOTE: A Mini-Cog screen was completed. Maximum score is 20. A value of 0 denotes this part of [place] MMSE was not completed or the patient failed this part of the Mini-Cog screening.   Mini-Cog Screening Orientation to Time -  Max 5 pts Orientation to Place - Max 5 pts Registration - Max 3 pts Recall - Max 3 pts Language Repeat - Max 1 pts Language Follow 3 Step Command - Max 3 pts     Immunization History  Administered Date(s) Administered  . Influenza, High Dose Seasonal PF 12/05/2016  . Influenza,inj,Quad PF,6+ Mos 11/15/2015  . Influenza-Unspecified 12/28/2014  . Pneumococcal Conjugate-13 04/03/2014  .  Pneumococcal Polysaccharide-23 03/16/2010   Screening Tests Health Maintenance  Topic Date Due  . DTaP/Tdap/Td (1 - Tdap) 01/17/2018 (Originally 04/23/1952)  . TETANUS/TDAP  01/17/2018 (Originally 04/23/1952)  . INFLUENZA VACCINE  Completed  . DEXA SCAN  Completed  . PNA vac Low Risk Adult  Completed  . FOOT EXAM  Discontinued  . HEMOGLOBIN A1C  Discontinued  . OPHTHALMOLOGY EXAM  Discontinued  . URINE MICROALBUMIN  Discontinued      Plan:     I have personally reviewed, addressed, and noted the following in the patient's chart:  A. Medical and social history B. Use of alcohol, tobacco or illicit drugs  C. Current medications and supplements D. Functional ability and status E.  Nutritional status F.  Physical activity G. Advance directives H. List of other physicians I.  Hospitalizations, surgeries, and ER visits in previous 12 months J.  [place] to include hearing, vision, cognitive, depression L. Referrals and appointments - none  In addition, I have reviewed and discussed with patient certain preventive protocols, quality metrics, and best practice recommendations. A written personalized care plan for preventive services as well as general preventive health recommendations were provided to patient.  See attached scanned questionnaire for additional information.   Signed,   Lindell Noe, MHA, BS, LPN Health Coach

## 2017-01-18 NOTE — Progress Notes (Signed)
Pre visit review using our clinic review tool, if applicable. No additional management support is needed unless otherwise documented below in the visit note. 

## 2017-01-19 DIAGNOSIS — I509 Heart failure, unspecified: Secondary | ICD-10-CM | POA: Diagnosis not present

## 2017-01-19 DIAGNOSIS — I5032 Chronic diastolic (congestive) heart failure: Secondary | ICD-10-CM

## 2017-01-19 NOTE — Progress Notes (Signed)
Daily Session Note  Patient Details  Name: Sierra Kaiser MRN: [id number] Date of Birth: [birth year] Referring Provider:     Pulmonary Rehab from 10/09/2016 in [facility]  Referring Provider  [name] MD      Encounter Date: 01/19/2017  Check In: Session Check In - 01/19/17 1134      Check-In   Location  [facility]Cardiac [facility] Pulmonary Rehab    Staff Present  Renita Papa, RN BSN;Joseph Darrin Nipper, [place], ACSM RCEP, Exercise Physiologist    Supervising physician immediately available to respond to emergencies  [facility] immediately available ER MD    Physician(s)  Dr. Corky Downs and Burlene Arnt    Medication changes reported      No    Fall or balance concerns reported     No    Warm-up and Cool-down  Performed as group-led instruction    VAD Patient?  No      Pain Assessment   Currently in Pain?  No/denies          Social History   Tobacco Use  Smoking Status Never Smoker  Smokeless Tobacco Never Used    Goals Met:  Proper associated with RPD/PD & O2 Sat Independence with exercise equipment Using PLB without cueing & demonstrates good technique Exercise tolerated well No report of cardiac concerns or symptoms Strength training completed today  Goals Unmet:  Not Applicable  Comments: Pt able to follow exercise prescription today without complaint.  Will continue to monitor for progression.  [place] Name 10/09/16 1551 01/19/17 1356       6 Minute Walk   Phase  Initial  Discharge    Distance  1250 feet  1440 feet    Distance % Change  -  15.2 %    Distance Feet Change  -  190 ft    Walk Time  6 minutes  6 minutes    # of Rest Breaks  0  0    MPH  2.37  2.72    METS  2.72  2.96    RPE  12  12    Perceived Dyspnea   2  2    VO2 Peak  9.5  10.34    Symptoms  Yes (comment)  Yes (comment)    Comments  SOB,  Right sided chest pain 2/10  back pain, L arm weak    Resting HR  73 bpm  77 bpm    Resting BP  146/64   124/62    Resting Oxygen Saturation   -  99 %    Exercise Oxygen Saturation  during 6 min walk  -  96 %    Max Ex. HR  96 bpm  107 bpm    Max Ex. BP  166/64  138/60    2 Minute Post BP  132/66  136/60      Interval HR   Baseline HR (retired)  73  -    1 Minute HR  94  104    2 Minute HR  96  106    3 Minute HR  95  107    4 Minute HR  94  104    5 Minute HR  94  -    6 Minute HR  94  101    2 Minute Post HR  74  72    Interval Heart Rate?  Yes  Yes      Interval Oxygen  Interval Oxygen?  Yes  Yes    Baseline Oxygen Saturation %  96 %  99 %    Resting Liters of Oxygen  0 L Room Air  -    1 Minute Oxygen Saturation %  97 %  98 %    1 Minute Liters of Oxygen  0 L  0 L Room Air    2 Minute Oxygen Saturation %  96 %  98 %    2 Minute Liters of Oxygen  0 L  0 L    3 Minute Oxygen Saturation %  97 %  100 %    3 Minute Liters of Oxygen  0 L  0 L    4 Minute Oxygen Saturation %  97 %  99 %    4 Minute Liters of Oxygen  0 L  0 L    5 Minute Oxygen Saturation %  97 %  96 %    5 Minute Liters of Oxygen  0 L  0 L    6 Minute Oxygen Saturation %  97 %  97 %    6 Minute Liters of Oxygen  0 L  0 L    2 Minute Post Oxygen Saturation %  99 %  98 %    2 Minute Post Liters of Oxygen  0 L  0 L        Dr. Emily Filbert is Medical Director for [facility] and [facility].

## 2017-01-20 NOTE — Progress Notes (Signed)
I reviewed health advisor's note, was available for consultation, and agree with documentation and plan.  

## 2017-01-22 DIAGNOSIS — I5032 Chronic diastolic (congestive) heart failure: Secondary | ICD-10-CM

## 2017-01-22 NOTE — Progress Notes (Signed)
Pulmonary Individual Treatment Plan  Patient Details  Name: Sierra Kaiser MRN: [id number] Date of Birth: [birth year] Referring Provider:     Pulmonary Rehab from 10/09/2016 in [facility] Cardiac and Pulmonary Rehab  Referring Provider  [name] MD      Initial Encounter Date:    Pulmonary Rehab from 10/09/2016 in [facility] Cardiac and Pulmonary Rehab  Date  10/09/16  Referring Provider  [name] MD      Visit Diagnosis: Chronic diastolic congestive heart failure ([place])  Patient's Home Medications on Admission:  Current Outpatient Medications:  .  Biotin (BIOTIN 5000) 5 MG CAPS, Take by mouth., Disp: , Rfl:  .  diphenhydrAMINE-zinc acetate (BENADRYL) cream, Apply 1 application topically 2 (two) times daily as needed for itching., Disp: , Rfl:  .  fluticasone (FLONASE) 50 MCG/ACT nasal spray, , Disp: , Rfl:  .  furosemide (LASIX) 40 MG tablet, Take 40 mg by mouth daily., Disp: , Rfl:  .  omeprazole (PRILOSEC) 40 MG capsule, Take 1 capsule (40 mg total) by mouth daily., Disp: 90 capsule, Rfl: 3 .  Polyethyl Glycol-Propyl Glycol (SYSTANE OP), Place 1 drop into both eyes daily as needed (dry eyes)., Disp: , Rfl:  .  Rivaroxaban (XARELTO) 15 MG TABS tablet, Take 1 tablet (15 mg total) by mouth daily with supper., Disp: 90 tablet, Rfl: 3 .  rosuvastatin (CRESTOR) 20 MG tablet, TAKE 1 TABLET DAILY, Disp: 90 tablet, Rfl: 2  Past Medical History: Past Medical History:  Diagnosis Date  . Anxiety   . CAD (coronary artery disease)   . CKD stage 3 secondary to diabetes ([place])   . Esophageal reflux   . GI bleeding 2012   during EGD necessitating open surgery  . Hiatal hernia   . History of blood transfusion   . History of diverticulitis   . History of UTI   . HTN (hypertension)   . Hyperlipidemia   . Meniere's disease   . Mitral regurgitation   . Osteopenia 12/11/2015   T score -1.2 femur, -2.0 spine (12/2015)  . Permanent atrial fibrillation ([place])    a. permanent b. s/p PVI RFA at [facility]  10/14 c. failed amio (neuro toxicity) and Tikosyn d. 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 PUD (peptic ulcer disease)   . Raynaud's disease   . SNHL (sensorineural hearing loss)    right ear  . Syncopal episodes   . Tinnitus   . Ulcerative colitis ([place])    per prior records  . Urinary incontinence, urge     Tobacco Use: Social History   Tobacco Use  Smoking Status Never Smoker  Smokeless Tobacco Never Used    Labs: Recent Review Flowsheet Data    Labs for ITP Cardiac and Pulmonary Rehab Latest Ref Rng & Units 03/26/2014 10/07/2015 08/15/2016   Cholestrol 100 - 199 mg/dL 293(A) 203(H) 159   LDLCALC 0 - 99 mg/dL 206 136(H) 73   HDL >39 mg/dL 45 42.50 57   Trlycerides 0 - 149 mg/dL 210(A) 125.0 143       Pulmonary Assessment Scores: Pulmonary Assessment Scores    Row Name 10/09/16 1446 11/24/16 1232 01/12/17 1154     ADL UCSD   ADL Phase  Entry  Mid  Exit   SOB Score total  63  45  56   Rest  0  0  0   Walk  _0 Stairs  4  3  3   Bath  _0 Dress  _1 Shop  _2 CAT Score   CAT Score  22  -  18     mMRC Score   mMRC Score  2 Simultaneous filing. User may not have seen previous data.  -  -      Pulmonary Function Assessment: Pulmonary Function Assessment - 10/09/16 1559      Breath   Bilateral Breath Sounds  Clear    Shortness of Breath  Fear of Shortness of Breath       Exercise Target Goals:    Exercise Program Goal: Individual exercise prescription set with THRR, safety & activity barriers. Participant demonstrates ability to understand and report RPE using BORG scale, to self-measure pulse accurately, and to acknowledge the importance of the exercise prescription.  Exercise Prescription Goal: Starting with aerobic activity 30 plus minutes a day, 3 days per week for initial exercise prescription. Provide home exercise prescription and guidelines that participant acknowledges understanding  prior to discharge.  Activity Barriers & Risk Stratification: Activity Barriers & Cardiac Risk Stratification - 10/09/16 1553      Activity Barriers & Cardiac Risk Stratification   Activity Barriers  Deconditioning;Muscular Weakness;Shortness of Breath       6 Minute Walk: 6 Minute Walk    Row Name 10/09/16 1551 01/19/17 1356       6 Minute Walk   Phase  Initial  Discharge    Distance  1250 feet  1440 feet    Distance % Change  -  15.2 %    Distance Feet Change  -  190 ft    Walk Time  6 minutes  6 minutes    # of Rest Breaks  0  0    MPH  2.37  2.72    METS  2.72  2.96    RPE  12  12    Perceived Dyspnea   2  2    VO2 Peak  9.5  10.34    Symptoms  Yes (comment)  Yes (comment)    Comments  SOB,  Right sided chest pain 2/10  back pain, L arm weak    Resting HR  73 bpm  77 bpm    Resting BP  146/64  124/62    Resting Oxygen Saturation   -  99 %    Exercise Oxygen Saturation  during 6 min walk  -  96 %    Max Ex. HR  96 bpm  107 bpm    Max Ex. BP  166/64  138/60    2 Minute Post BP  132/66  136/60      Interval HR   Baseline HR (retired)  73  -    1 Minute HR  94  104    2 Minute HR  96  106    3 Minute HR  95  107    4 Minute HR  94  104    5 Minute HR  94  -    6 Minute HR  94  101    2 Minute Post HR  74  72    Interval Heart Rate?  Yes  Yes      Interval Oxygen   Interval Oxygen?  Yes  Yes    Baseline Oxygen Saturation %  96 %  99 %    Resting Liters  of Oxygen  0 L Room Air  -    1 Minute Oxygen Saturation %  97 %  98 %    1 Minute Liters of Oxygen  0 L  0 L Room Air    2 Minute Oxygen Saturation %  96 %  98 %    2 Minute Liters of Oxygen  0 L  0 L    3 Minute Oxygen Saturation %  97 %  100 %    3 Minute Liters of Oxygen  0 L  0 L    4 Minute Oxygen Saturation %  97 %  99 %    4 Minute Liters of Oxygen  0 L  0 L    5 Minute Oxygen Saturation %  97 %  96 %    5 Minute Liters of Oxygen  0 L  0 L    6 Minute Oxygen Saturation %  97 %  97 %    6 Minute  Liters of Oxygen  0 L  0 L    2 Minute Post Oxygen Saturation %  99 %  98 %    2 Minute Post Liters of Oxygen  0 L  0 L      Oxygen Initial Assessment: Oxygen Initial Assessment - 10/09/16 1450      Home Oxygen   Home Oxygen Device  None    Sleep Oxygen Prescription  None    Home Exercise Oxygen Prescription  None    Home at Rest Exercise Oxygen Prescription  None      Intervention   Short Term Goals  To learn and understand importance of monitoring SPO2 with pulse oximeter and demonstrate accurate use of the pulse oximeter.;To Learn and understand importance of maintaining oxygen saturations>88%;To learn and demonstrate proper purse lipped breathing techniques or other breathing techniques. does not have any respiratory medications    Long  Term Goals  Maintenance of O2 saturations>88%;Exhibits proper breathing techniques, such as purse lipped breathing or other method taught during program session;Verbalizes importance of monitoring SPO2 with pulse oximeter and return demonstration       Oxygen Re-Evaluation: Oxygen Re-Evaluation    Row Name 11/15/16 1210 12/20/16 1526 01/12/17 1343         Program Oxygen Prescription   Program Oxygen Prescription  None  None  None       Home Oxygen   Home Oxygen Device  None  None  None     Sleep Oxygen Prescription  None  None  None     Home Exercise Oxygen Prescription  None  None  None     Home at Rest Exercise Oxygen Prescription  None  None  None       Goals/Expected Outcomes   Short Term Goals  To learn and understand importance of monitoring SPO2 with pulse oximeter and demonstrate accurate use of the pulse oximeter.;To learn and understand importance of maintaining oxygen saturations>88%;To learn and demonstrate proper pursed lip breathing techniques or other breathing techniques.  To learn and understand importance of maintaining oxygen saturations>88%;To learn and demonstrate proper pursed lip breathing techniques or other  breathing techniques.;To learn and understand importance of monitoring SPO2 with pulse oximeter and demonstrate accurate use of the pulse oximeter.  To learn and understand importance of maintaining oxygen saturations>88%;To learn and demonstrate proper pursed lip breathing techniques or other breathing techniques.;To learn and understand importance of monitoring SPO2 with pulse oximeter and demonstrate accurate use of the pulse oximeter.  Long  Term Goals  Verbalizes importance of monitoring SPO2 with pulse oximeter and return demonstration;Maintenance of O2 saturations>88%;Exhibits proper breathing techniques, such as pursed lip breathing or other method taught during program session;Compliance with respiratory medication  Exhibits proper breathing techniques, such as pursed lip breathing or other method taught during program session;Verbalizes importance of monitoring SPO2 with pulse oximeter and return demonstration;Maintenance of O2 saturations>88%  Verbalizes importance of monitoring SPO2 with pulse oximeter and return demonstration;Maintenance of O2 saturations>88%;Exhibits proper breathing techniques, such as pursed lip breathing or other method taught during program session     Comments  Sierra Kaiser like somedays she has improved and other days she feels like she has not. Her oxygen has been above 88 percent and has never dropped below 92 percent. She has practiced PLB breathing at home a little bit. Informed her to het a pulse oximeter to check her oxygen.  Sierra Kaiser states that her breathng has improved and increased her socialization. She can now walk up the stairs at church more efficiently. Her oxygen never falls below 88 percent when exercising. She does not use PLB in class unless informed to.  Sierra Kaiser has been using her PLB at home more. She understands her oxygen should be above 88 percent. She does not take any respiratory medications or use oxygen at home.      Goals/Expected Outcomes  Short:  work on PLB techniques. Obtain a pulse oximeter.  Long: Be proficient in PLB. Be proficient in checking her oxygen independently.  Short: Work on increasing exercise levels to increase ADL's. Long: Maintain exercise to increase ADL  Short: work on PLB more in class and at home. Long: Maintain PLB techniques post [employer]        Oxygen Discharge (Final Oxygen Re-Evaluation): Oxygen Re-Evaluation - 01/12/17 1343      Program Oxygen Prescription   Program Oxygen Prescription  None      Home Oxygen   Home Oxygen Device  None    Sleep Oxygen Prescription  None    Home Exercise Oxygen Prescription  None    Home at Rest Exercise Oxygen Prescription  None      Goals/Expected Outcomes   Short Term Goals  To learn and understand importance of maintaining oxygen saturations>88%;To learn and demonstrate proper pursed lip breathing techniques or other breathing techniques.;To learn and understand importance of monitoring SPO2 with pulse oximeter and demonstrate accurate use of the pulse oximeter.    Long  Term Goals  Verbalizes importance of monitoring SPO2 with pulse oximeter and return demonstration;Maintenance of O2 saturations>88%;Exhibits proper breathing techniques, such as pursed lip breathing or other method taught during program session    Comments  Sierra Kaiser has been using her PLB at home more. She understands her oxygen should be above 88 percent. She does not take any respiratory medications or use oxygen at home.     Goals/Expected Outcomes  Short: work on PLB more in class and at home. Long: Maintain PLB techniques post [facility]       Initial Exercise Prescription: Initial Exercise Prescription - 10/09/16 1500      Date of Initial Exercise RX and Referring Provider   Date  10/09/16    Referring Provider  [name] MD      Treadmill   MPH  2.2    Grade  0    Minutes  15    METs  2.68      NuStep   Level  2    SPM  80  Minutes  15    METs  2      REL-XR   Level  1     Speed  50    Minutes  15    METs  2      Prescription Details   Frequency (times per week)  3    Duration  Progress to 45 minutes of aerobic exercise without signs/symptoms of physical distress      Intensity   THRR 40-80% of Max Heartrate  99-126    Ratings of Perceived Exertion  11-13    Perceived Dyspnea  0-4      Progression   Progression  Continue to progress workloads to maintain intensity without signs/symptoms of physical distress.      Resistance Training   Training Prescription  Yes    Weight  3 lbs    Reps  10-15       Perform Capillary Blood Glucose checks as needed.  Exercise Prescription Changes: Exercise Prescription Changes    Row Name 10/09/16 1500 10/17/16 1500 10/27/16 1300 11/01/16 1500 11/03/16 1200     Response to Exercise   Blood Pressure (Admit)  146/64  102/62  -  136/70  -   Blood Pressure (Exercise)  166/64  146/50  -  -  -   Blood Pressure (Exit)  132/66  130/62  -  106/70  -   Heart Rate (Admit)  73 bpm  76 bpm  -  82 bpm  -   Heart Rate (Exercise)  96 bpm  77 bpm  -  93 bpm  -   Heart Rate (Exit)  74 bpm  74 bpm  -  51 bpm  -   Oxygen Saturation (Admit)  96 %  96 %  -  97 %  -   Oxygen Saturation (Exercise)  96 %  99 %  -  95 %  -   Oxygen Saturation (Exit)  99 %  99 %  -  96 %  -   Rating of Perceived Exertion (Exercise)  12  15  -  13  -   Perceived Dyspnea (Exercise)  2  3  -  2  -   Symptoms  SOB, Right sided chest pain 2/10  -  -  none  -   Comments  walk test results  -  -  -  -   Duration  -  Continue with 45 min of aerobic exercise without signs/symptoms of physical distress.  -  Continue with 45 min of aerobic exercise without signs/symptoms of physical distress.  -   Intensity  -  THRR unchanged  -  THRR unchanged  -     Progression   Progression  -  Continue to progress workloads to maintain intensity without signs/symptoms of physical distress.  -  Continue to progress workloads to maintain intensity without signs/symptoms of  physical distress.  -   Average METs  -  2.16  -  2.33  -     Resistance Training   Training Prescription  -  Yes  -  Yes  -   Weight  -  3 lbs  -  3 lbs  -   Reps  -  10-15  -  10-15  -     Interval Training   Interval Training  -  No  -  No  -     Treadmill   MPH  -  2.2  -  2.2  -  Grade  -  0  -  0  -   Minutes  -  15  -  15  -   METs  -  2.68  -  2.68  -     NuStep   Level  -  2  -  2  -   SPM  -  76  -  77  -   Minutes  -  15  -  15  -   METs  -  2.1  -  1.98  -     REL-XR   Level  -  1  -  3  -   Speed  -  50  -  40  -   Minutes  -  15  -  15  -   METs  -  1.7  -  2.4  -     Home Exercise Plan   Plans to continue exercise at  -  -  Home (comment) walking at home  Home (comment) walking at home  Home (comment)   Frequency  -  -  Add 1 additional day to program exercise sessions.  Add 1 additional day to program exercise sessions.  Add 1 additional day to program exercise sessions.   Initial Home Exercises Provided  -  -  10/27/16  10/27/16  11/03/16   Row Name 11/14/16 1300 11/28/16 1400 12/12/16 1400 12/27/16 1500 01/09/17 1500     Response to Exercise   Blood Pressure (Admit)  110/62  120/70  128/64  108/56  124/62   Blood Pressure (Exit)  102/56  122/70  126/64  102/58  110/58   Heart Rate (Admit)  79 bpm  73 bpm  96 bpm  78 bpm  73 bpm   Heart Rate (Exercise)  91 bpm  97 bpm  82 bpm  82 bpm  85 bpm   Heart Rate (Exit)  68 bpm  70 bpm  82 bpm  83 bpm  70 bpm   Oxygen Saturation (Admit)  99 %  97 %  97 %  99 %  99 %   Oxygen Saturation (Exercise)  99 %  96 %  99 %  94 %  98 %   Oxygen Saturation (Exit)  97 %  97 %  99 %  99 %  99 %   Rating of Perceived Exertion (Exercise)  _0 Perceived Dyspnea (Exercise)  _1 Symptoms  none  none  none  none  none   Duration  Continue with 45 min of aerobic exercise without signs/symptoms of physical distress.  Continue with 45 min of aerobic exercise without signs/symptoms of physical distress.   Continue with 45 min of aerobic exercise without signs/symptoms of physical distress.  Continue with 45 min of aerobic exercise without signs/symptoms of physical distress.  Continue with 45 min of aerobic exercise without signs/symptoms of physical distress.   Intensity  THRR unchanged  THRR unchanged  THRR unchanged  THRR unchanged  THRR unchanged     Progression   Progression  Continue to progress workloads to maintain intensity without signs/symptoms of physical distress.  Continue to progress workloads to maintain intensity without signs/symptoms of physical distress.  Continue to progress workloads to maintain intensity without signs/symptoms of physical distress.  Continue to progress workloads to maintain intensity without signs/symptoms of physical distress.  Continue to  progress workloads to maintain intensity without signs/symptoms of physical distress.   Average METs  2.27  2.33  2.64  2.56  2.62     Resistance Training   Training Prescription  Yes  Yes  Yes  Yes  Yes   Weight  4 lbs  4 lbs  4 lbs  4 lbs  4 lbs   Reps  10-15  10-15  10-15  10-15  10-15     Interval Training   Interval Training  No  No  No  No  No     Treadmill   MPH  2.2  2.5  2.5  2.5  2   Grade  0  0.5  0  0  0.5   Minutes  '15  15  15  15  15   '$ METs  2.68  3.09  2.91  2.91  2.67     NuStep   Level  '3  3  5  5  6   '$ SPM  88  70  80  74  68   Minutes  '15  15  15  15  15   '$ METs  2.3  2  2.9  2.2  2.9     REL-XR   Level  '3  4  3  4  6   '$ Speed  39  34  58  3.9  40   Minutes  '15  15  15  15  15   '$ METs  1.9  1.9  2.1  2.2  2.3     Home Exercise Plan   Plans to continue exercise at  Home (comment)  Home (comment)  Home (comment)  Home (comment)  Home (comment)   Frequency  Add 1 additional day to program exercise sessions.  Add 1 additional day to program exercise sessions.  Add 2 additional days to program exercise sessions.  Add 2 additional days to program exercise sessions.  Add 2 additional days to  program exercise sessions.   Initial Home Exercises Provided  11/03/16  11/03/16  11/03/16  11/03/16  11/03/16      Exercise Comments: Exercise Comments    Row Name 10/11/16 1244           Exercise Comments  First full day of exercise!  Patient was oriented to gym and equipment including functions, settings, policies, and procedures.  Patient's individual exercise prescription and treatment plan were reviewed.  All starting workloads were established based on the results of the 6 minute walk test done at initial orientation visit.  The plan for exercise progression was also introduced and progression will be customized based on patient's performance and goals.          Exercise Goals and Review: Exercise Goals    Row Name 10/09/16 1555             Exercise Goals   Increase Physical Activity  Yes       Intervention  Provide advice, education, support and counseling about physical activity/exercise needs.;Develop an individualized exercise prescription for aerobic and resistive training based on initial evaluation findings, risk stratification, comorbidities and participant's personal goals.       Expected Outcomes  Achievement of increased cardiorespiratory fitness and enhanced flexibility, muscular endurance and strength shown through measurements of functional capacity and personal statement of participant.       Increase Strength and Stamina  Yes       Intervention  Provide advice, education, support and counseling about  physical activity/exercise needs.;Develop an individualized exercise prescription for aerobic and resistive training based on initial evaluation findings, risk stratification, comorbidities and participant's personal goals.       Expected Outcomes  Achievement of increased cardiorespiratory fitness and enhanced flexibility, muscular endurance and strength shown through measurements of functional capacity and personal statement of participant.          Exercise  Goals Re-Evaluation : Exercise Goals Re-Evaluation    Row Name 10/17/16 1511 10/27/16 1346 11/01/16 1506 11/03/16 1230 11/14/16 1351     Exercise Goal Re-Evaluation   Exercise Goals Review  Increase Physical Activity;Increase Strenth and Stamina  Increase Physical Activity;Increase Strenth and Stamina  Increase Physical Activity;Increase Strength and Stamina  Increase Physical Activity;Increase Strength and Stamina;Knowledge and understanding of Target Heart Rate Range (THRR);Able to check pulse independently;Understanding of Exercise Prescription  Increase Physical Activity;Increase Strength and Stamina   Comments  Sierra Kaiser is off to a good start in rehab.  She has already completed 3 full days of exercise!!  We will continue to monitor her progression.   Reviewed home exercise with pt today.  Pt plans to by walking at home for exercise.  Reviewed THR, pulse, RPE, sign and symptoms, NTG use, and when to call 911 or MD.  Also discussed weather considerations and indoor options.  Pt voiced understanding.  Sierra Kaiser continues to do well in rehab. She is now up to level 3 on the recumbent elliptical!  She continues to make improvements and wants to work hard.  We will continue to monitor her progress.   Reviewed home exercise with pt today.  Pt plans to walk at home and use weights for exercise.  Reviewed THR, pulse, RPE, sign and symptoms, and when to call 911 or MD.  Also discussed weather considerations and indoor options.  Also discussed importance of purchasing a pulse oximeter to monitor oxygen saturations at home and keep above 88%.  Pt voiced understanding.  Sierra Kaiser has been doing well in rehab.  She is now up to level 3 on the recumbent ellipitcal and NuStep.  She continues to bounce during stretching and needs constant reminding to stretch slowly.  We will continue to monitor her progress.    Expected Outcomes  Short: Continue to attend rehab classes.  Long: Make exercise part of daily routine.   Short: add  walking to home exercise and continue exercising in class. Long: Continue walking and adding home exercise post [facility]  Short: Continue to walk more at home.  Long: Continue to work on Scientist, research (life sciences).   Short: Add in at least one extra day at home for walking or join grandson's gym.  Long: Make exercise part of routine.   Short: Try to stretch better in class.  Long: Continue work on Education administrator.   Sierra Kaiser Name 11/28/16 1453 12/12/16 1405 12/27/16 1539 01/09/17 1520       Exercise Goal Re-Evaluation   Exercise Goals Review  Increase Physical Activity;Increase Strength and Stamina  Increase Physical Activity;Increase Strength and Stamina  Increase Physical Activity;Increase Strength and Stamina  Increase Physical Activity;Increase Strength and Stamina;Understanding of Exercise Prescription    Comments  Sierra Kaiser continues to do well in rehab.  She is now doing level 4 on the recumbent elliptical.  She still need reminding to follow along with class.  We will continue to monitor her progress.   Sierra Kaiser has been doing well in rehab.  She has worked her way up to level 5 on te NuStep.  She has fallen back down to level 1 on the recumbent elliptical and we will talk to her about moving back up.  We will continue to monitor her progres.   Sierra Kaiser continues to do well in rehab.  She is up to level 4 on the recumbent elliptical.  We will try to push her a little more.  We will continue to monitor her progression.   Sierra Kaiser has done well in rehab.  She is already nearing graduation!!  She is up to level 6 on the NuStep now.  We will continue to monitor her progress.     Expected Outcomes  Short: Continue to work on stretches.  Long: Continue to exercise some at home.  Short: Move recumbent elliptical back up.  Long: Continue to work on Printmaker and stamina.   Short: Increase workloads and add incline on treadmill.  Long: Make exercise part of daily routine.   Short: Improve  results on post 6MWT.  Long: Continue to exercise independently       Discharge Exercise Prescription (Final Exercise Prescription Changes): Exercise Prescription Changes - 01/09/17 1500      Response to Exercise   Blood Pressure (Admit)  124/62    Blood Pressure (Exit)  110/58    Heart Rate (Admit)  73 bpm    Heart Rate (Exercise)  85 bpm    Heart Rate (Exit)  70 bpm    Oxygen Saturation (Admit)  99 %    Oxygen Saturation (Exercise)  98 %    Oxygen Saturation (Exit)  99 %    Rating of Perceived Exertion (Exercise)  13    Perceived Dyspnea (Exercise)  3    Symptoms  none    Duration  Continue with 45 min of aerobic exercise without signs/symptoms of physical distress.    Intensity  THRR unchanged      Progression   Progression  Continue to progress workloads to maintain intensity without signs/symptoms of physical distress.    Average METs  2.62      Resistance Training   Training Prescription  Yes    Weight  4 lbs    Reps  10-15      Interval Training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 lower sodium intake. Reported increased fruit and vegetable intake. Reports medication compliance.;Short term: Daily weights obtained and reported for increase. Utilizing diuretic protocols set by  physician.;Long term: Adoption of self-care skills and reduction of barriers for early signs and symptoms recognition and intervention leading to self-care maintenance.    Lipids  Yes    Intervention  Provide education and support for participant on nutrition & aerobic/resistive exercise along with prescribed medications to achieve LDL '70mg'$ , HDL >'40mg'$ .    Expected Outcomes  Short Term: Participant states understanding of desired cholesterol values and is compliant with medications prescribed. Participant is following exercise prescription and nutrition guidelines.;Long Term: Cholesterol controlled with medications as prescribed, with individualized exercise RX and with personalized nutrition plan. Value goals: LDL < '70mg'$ , HDL > 40 mg.    Stress  Yes    Intervention  Offer individual and/or small group education and counseling on adjustment to heart disease, stress management and health-related lifestyle change. Teach and support self-help strategies.;Refer participants experiencing significant psychosocial distress to appropriate mental health specialists for further evaluation and treatment. When possible, include family members and significant others in education/counseling sessions.    Expected Outcomes  Short Term: Participant demonstrates changes in health-related behavior, relaxation and other stress management skills, ability to obtain effective social support, and compliance with psychotropic medications if prescribed.;Long Term: Emotional  wellbeing is indicated by absence of clinically significant psychosocial distress or social isolation.       Core Components/Risk Factors/Patient Goals Review:  Goals and Risk Factor Review    Row Name 11/15/16 1205 12/20/16 1535 01/15/17 1206         Core Components/Risk Factors/Patient Goals Review   Personal Goals Review  Heart Failure;Stress;Lipids;Weight Management/Obesity;Improve shortness of breath with ADL's  Weight Management/Obesity;Improve  shortness of breath with ADL's;Heart Failure;Stress  Weight Management/Obesity;Improve shortness of breath with ADL's;Heart Failure;Stress     Review  Carli states the program has helped her alot since she started. Her dizziness has been better but is still lingers. She is taking her medication prescribed by the doctors with no changes currently.  Torri states [facility] has helped her with her breathing, ADL's and her overall health. Her brother inlaw has died recently which was very hard for her.  Caylie states that she does not get stressed out. Her ADL's have improved but yesterday she had a rough day. Her weight has been stable and she wants to maintain weight. She has an appointment for blood work to be done this week.  Chantee says exercise has helped her alot  with her mood and being able to be social.     Expected Outcomes  Short: minimize stress, maintain weight, SOB with ADL's. Get her lipids checked. Long: Maintain stress by contiuing to exercise after [facility].   Short: continue to attend [facility] to decrease stress. Long: Graduate [facility] and maintain ADL.  Short: improve ADL's by working increasing levels on exercises. Long: maintain independent exercise.        Core Components/Risk Factors/Patient Goals at Discharge (Final Review):  Goals and Risk Factor Review - 01/15/17 1206      Core Components/Risk Factors/Patient Goals Review   Personal Goals Review  Weight Management/Obesity;Improve shortness of breath with ADL's;Heart Failure;Stress    Review  Maylea states that she does not get stressed out. Her ADL's have improved but yesterday she had a rough day. Her weight has been stable and she wants to maintain weight. She has an appointment for blood work to be done this week.  Mazie says exercise has helped her alot  with her mood and being able to be social.    Expected Outcomes  Short: improve ADL's by working increasing levels on exercises. Long: maintain independent exercise.        ITP Comments: ITP Comments    Row Name 10/09/16 1555 10/16/16 1209 10/30/16 0844 11/27/16 0842 12/13/16 1042   ITP Comments  Medical evaluation completed. Chart sent to Dr. Charm Barges director of [facility] for review and changes.  Patient has slight episode of dizziness and has already talked to her ear doctor. Blood pressure increased after rest and water.  30 day review completed. ITP sent to Dr. Emily Filbert Director of [facility]. Continue with ITP unless changes are made by physician.    30 day review completed. ITP sent to Dr. Emily Filbert Director of [facility]. Continue with ITP unless changes are made by physician.    Shaylee called to let us know that her brother in law has passed away and that she will be out with her husband.  Her husband other brother and sister are also in the hospital and not doing well so she may out on Friday as well.  She hopes to return on Monday.   Dawson Name 12/25/16 [phone] 01/22/17 0858         ITP Comments  30  day review completed. ITP sent to Dr. Emily Filbert Director of [facility]. Continue with ITP unless changes are made by physician.    30 day review completed. ITP sent to Dr. Emily Filbert Director of [facility]. Continue with ITP unless changes are made by physician.           Comments: 30 day review

## 2017-01-23 NOTE — Patient Instructions (Signed)
Discharge Instructions  Patient Details  Name: Sierra Kaiser MRN: [id number] Date of Birth: 09-11-1933 Referring Provider:  No ref. provider found   Number of Visits: 36/36  Reason for Discharge:  Patient reached a stable level of exercise. Patient independent in their exercise. Patient has met program and personal goals.  Smoking History:  Social History   Tobacco Use  Smoking Status Never Smoker  Smokeless Tobacco Never Used    Diagnosis:  Chronic diastolic congestive heart failure ([place])  Initial Exercise Prescription: Initial Exercise Prescription - 10/09/16 1500      Date of Initial Exercise RX and Referring Provider   Date  10/09/16    Referring Provider  [name] MD      Treadmill   MPH  2.2    Grade  0    Minutes  15    METs  2.68      NuStep   Level  2    SPM  80    Minutes  15    METs  2      REL-XR   Level  1    Speed  50    Minutes  15    METs  2      Prescription Details   Frequency (times per week)  3    Duration  Progress to 45 minutes of aerobic exercise without signs/symptoms of physical distress      Intensity   THRR 40-80% of Max Heartrate  99-126    Ratings of Perceived Exertion  11-13    Perceived Dyspnea  0-4      Progression   Progression  Continue to progress workloads to maintain intensity without signs/symptoms of physical distress.      Resistance Training   Training Prescription  Yes    Weight  3 lbs    Reps  10-15       Discharge Exercise Prescription (Final Exercise Prescription Changes): Exercise Prescription Changes - 01/23/17 1400      Response to Exercise   Blood Pressure (Admit)  124/62    Blood Pressure (Exercise)  138/60    Blood Pressure (Exit)  124/68    Heart Rate (Admit)  77 bpm    Heart Rate (Exercise)  107 bpm    Heart Rate (Exit)  70 bpm    Oxygen Saturation (Admit)  99 %    Oxygen Saturation (Exercise)  93 %    Oxygen Saturation (Exit)  99 %    Rating of Perceived Exertion (Exercise)  12     Perceived Dyspnea (Exercise)  2    Symptoms  none    Duration  Continue with 45 min of aerobic exercise without signs/symptoms of physical distress.    Intensity  THRR unchanged      Progression   Progression  Continue to progress workloads to maintain intensity without signs/symptoms of physical distress.    Average METs  2.8      Resistance Training   Training Prescription  Yes    Weight  4 lbs    Reps  10-15      Interval Training   Interval Training  No      Treadmill   MPH  2.5    Grade  0.5    Minutes  15    METs  3.09      NuStep   Level  5    SPM  78    Minutes  15    METs  3.5  REL-XR   Level  4    Speed  40    Minutes  15    METs  1.8      Home Exercise Plan   Plans to continue exercise at  Home (comment)    Frequency  Add 2 additional days to program exercise sessions.    Initial Home Exercises Provided  11/03/16       Functional Capacity: 6 Minute Walk    Row Name 10/09/16 1551 01/19/17 1356       6 Minute Walk   Phase  Initial  Discharge    Distance  1250 feet  1440 feet    Distance % Change  -  15.2 %    Distance Feet Change  -  190 ft    Walk Time  6 minutes  6 minutes    # of Rest Breaks  0  0    MPH  2.37  2.72    METS  2.72  2.96    RPE  12  12    Perceived Dyspnea   2  2    VO2 Peak  9.5  10.34    Symptoms  Yes (comment)  Yes (comment)    Comments  SOB,  Right sided chest pain 2/10  back pain, L arm weak    Resting HR  73 bpm  77 bpm    Resting BP  146/64  124/62    Resting Oxygen Saturation   -  99 %    Exercise Oxygen Saturation  during 6 min walk  -  96 %    Max Ex. HR  96 bpm  107 bpm    Max Ex. BP  166/64  138/60    2 Minute Post BP  132/66  136/60      Interval HR   Baseline HR (retired)  73  -    1 Minute HR  94  104    2 Minute HR  96  106    3 Minute HR  95  107    4 Minute HR  94  104    5 Minute HR  94  -    6 Minute HR  94  101    2 Minute Post HR  74  72    Interval Heart Rate?  Yes  Yes       Interval Oxygen   Interval Oxygen?  Yes  Yes    Baseline Oxygen Saturation %  96 %  99 %    Resting Liters of Oxygen  0 L Room Air  -    1 Minute Oxygen Saturation %  97 %  98 %    1 Minute Liters of Oxygen  0 L  0 L Room Air    2 Minute Oxygen Saturation %  96 %  98 %    2 Minute Liters of Oxygen  0 L  0 L    3 Minute Oxygen Saturation %  97 %  100 %    3 Minute Liters of Oxygen  0 L  0 L    4 Minute Oxygen Saturation %  97 %  99 %    4 Minute Liters of Oxygen  0 L  0 L    5 Minute Oxygen Saturation %  97 %  96 %    5 Minute Liters of Oxygen  0 L  0 L    6 Minute Oxygen Saturation %  97 %  97 %    6 Minute Liters of Oxygen  0 L  0 L    2 Minute Post Oxygen Saturation %  99 %  98 %    2 Minute Post Liters of Oxygen  0 L  0 L       Quality of Life:   Personal Goals: Goals established at orientation with interventions provided to work toward goal. Personal Goals and Risk Factors at Admission - 10/09/16 1453      Core Components/Risk Factors/Patient Goals on Admission    Weight Management  Yes    Intervention  Weight Management: Develop a combined nutrition and exercise program designed to reach desired caloric intake, while maintaining appropriate intake of nutrient and fiber, sodium and fats, and appropriate energy expenditure required for the weight goal.;Weight Management: Provide education and appropriate resources to help participant work on and attain dietary goals.;Weight Management/Obesity: Establish reasonable short term and long term weight goals.    Admit Weight  146 lb (66.2 kg)    Goal Weight: Short Term  140 lb (63.5 kg)    Goal Weight: Long Term  140 lb (63.5 kg)    Expected Outcomes  Short Term: Continue to assess and modify interventions until short term weight is achieved;Long Term: Adherence to nutrition and physical activity/exercise program aimed toward attainment of established weight goal;Weight Maintenance: Understanding of the daily nutrition guidelines, which  includes 25-35% calories from fat, 7% or less cal from saturated fats, less than '200mg'$  cholesterol, less than 1.5gm of sodium, & 5 or more servings of fruits and vegetables daily;Weight Loss: Understanding of general recommendations for a balanced deficit meal plan, which promotes 1-2 lb weight loss per week and includes a negative energy balance of [phone] kcal/d;Understanding recommendations for meals to include 15-35% energy as protein, 25-35% energy from fat, 35-60% energy from carbohydrates, less than '200mg'$  of dietary cholesterol, 20-35 gm of total fiber daily;Understanding of distribution of calorie intake throughout the day with the consumption of 4-5 meals/snacks    Improve shortness of breath with ADL's  Yes    Intervention  Provide education, individualized exercise plan and daily activity instruction to help decrease symptoms of SOB with activities of daily living.    Expected Outcomes  Short Term: Achieves a reduction of symptoms when performing activities of daily living.    Develop more efficient breathing techniques such as purse lipped breathing and diaphragmatic breathing; and practicing self-pacing with activity  Yes    Intervention  Provide education, demonstration and support about specific breathing techniuqes utilized for more efficient breathing. Include techniques such as pursed lipped breathing, diaphragmatic breathing and self-pacing activity.    Expected Outcomes  Short Term: Participant will be able to demonstrate and use breathing techniques as needed throughout daily activities.    Increase knowledge of respiratory medications and ability to use respiratory devices properly   Yes    Intervention  Provide education and demonstration as needed of appropriate use of medications, inhalers, and oxygen therapy.    Expected Outcomes  Short Term: Achieves understanding of medications use. Understands that oxygen is a medication prescribed by physician. Demonstrates appropriate use of  inhaler and oxygen therapy.    Heart Failure  Yes    Intervention  Provide a combined exercise and nutrition program that is supplemented with education, support and counseling about heart failure. Directed toward relieving symptoms such as shortness of breath, decreased exercise tolerance, and extremity edema.    Expected Outcomes  Improve functional capacity of life;Short  term: Attendance in program 2-3 days a week with increased exercise capacity. Reported lower sodium intake. Reported increased fruit and vegetable intake. Reports medication compliance.;Short term: Daily weights obtained and reported for increase. Utilizing diuretic protocols set by physician.;Long term: Adoption of self-care skills and reduction of barriers for early signs and symptoms recognition and intervention leading to self-care maintenance.    Lipids  Yes    Intervention  Provide education and support for participant on nutrition & aerobic/resistive exercise along with prescribed medications to achieve LDL '70mg'$ , HDL >'40mg'$ .    Expected Outcomes  Short Term: Participant states understanding of desired cholesterol values and is compliant with medications prescribed. Participant is following exercise prescription and nutrition guidelines.;Long Term: Cholesterol controlled with medications as prescribed, with individualized exercise RX and with personalized nutrition plan. Value goals: LDL < '70mg'$ , HDL > 40 mg.    Stress  Yes    Intervention  Offer individual and/or small group education and counseling on adjustment to heart disease, stress management and health-related lifestyle change. Teach and support self-help strategies.;Refer participants experiencing significant psychosocial distress to appropriate mental health specialists for further evaluation and treatment. When possible, include family members and significant others in education/counseling sessions.    Expected Outcomes  Short Term: Participant demonstrates changes in  health-related behavior, relaxation and other stress management skills, ability to obtain effective social support, and compliance with psychotropic medications if prescribed.;Long Term: Emotional wellbeing is indicated by absence of clinically significant psychosocial distress or social isolation.        Personal Goals Discharge: Goals and Risk Factor Review - 01/15/17 1206      Core Components/Risk Factors/Patient Goals Review   Personal Goals Review  Weight Management/Obesity;Improve shortness of breath with ADL's;Heart Failure;Stress    Review  Fallyn states that she does not get stressed out. Her ADL's have improved but yesterday she had a rough day. Her weight has been stable and she wants to maintain weight. She has an appointment for blood work to be done this week.  Ernie says exercise has helped her alot  with her mood and being able to be social.    Expected Outcomes  Short: improve ADL's by working increasing levels on exercises. Long: maintain independent exercise.       Exercise Goals and Review: Exercise Goals    Row Name 10/09/16 1555             Exercise Goals   Increase Physical Activity  Yes       Intervention  Provide advice, education, support and counseling about physical activity/exercise needs.;Develop an individualized exercise prescription for aerobic and resistive training based on initial evaluation findings, risk stratification, comorbidities and participant's personal goals.       Expected Outcomes  Achievement of increased cardiorespiratory fitness and enhanced flexibility, muscular endurance and strength shown through measurements of functional capacity and personal statement of participant.       Increase Strength and Stamina  Yes       Intervention  Provide advice, education, support and counseling about physical activity/exercise needs.;Develop an individualized exercise prescription for aerobic and resistive training based on initial evaluation  findings, risk stratification, comorbidities and participant's personal goals.       Expected Outcomes  Achievement of increased cardiorespiratory fitness and enhanced flexibility, muscular endurance and strength shown through measurements of functional capacity and personal statement of participant.          Nutrition & Weight - Outcomes: Pre Biometrics - 10/09/16 1556  Pre Biometrics   Height  5' 7.5" (1.715 m)    Weight  146 lb 12.8 oz (66.6 kg)    Waist Circumference  31 inches    Hip Circumference  36.5 inches    Waist to Hip Ratio  0.85 %    BMI (Calculated)  22.7        Nutrition: Nutrition Therapy & Goals - 01/15/17 1204      Nutrition Therapy   RD appointment defered  Yes       Nutrition Discharge: Nutrition Assessments - 01/12/17 1155      MEDFICTS Scores   Pre Score  62       Education Questionnaire Score: Knowledge Questionnaire Score - 01/12/17 1153      Knowledge Questionnaire Score   Pre Score  5/10    Post Score  7/10 Reviewed with patient       Goals reviewed with patient; copy given to patient.

## 2017-01-24 ENCOUNTER — Encounter: Payer: Medicare Other | Admitting: Family Medicine

## 2017-01-29 DIAGNOSIS — I509 Heart failure, unspecified: Secondary | ICD-10-CM | POA: Diagnosis not present

## 2017-01-29 DIAGNOSIS — I5032 Chronic diastolic (congestive) heart failure: Secondary | ICD-10-CM

## 2017-01-29 NOTE — Progress Notes (Signed)
Discharge Progress Report  Patient Details  Name: Sierra Kaiser MRN: [id number] Date of Birth: 1933/11/21 Referring Provider:     Pulmonary Rehab from 10/09/2016 in [facility]  Referring Provider  [name] MD       Number of Visits: 36/36  Reason for Discharge:  Patient reached a stable level of exercise. Patient independent in their exercise. Patient has met program and personal goals.  Smoking History:  Social History   Tobacco Use  Smoking Status Never Smoker  Smokeless Tobacco Never Used    Diagnosis:  Chronic diastolic congestive heart failure ([place])  ADL [facility]: Pulmonary Assessment Scores    Row Name 10/09/16 1446 11/24/16 1232 01/12/17 1154     ADL [facility]   ADL Phase  Entry  Mid  Exit   SOB Score total  63  45  56   Rest  0  0  0   Walk  _0 Stairs  _1 Bath  _2 Dress  _3 Shop  _4 CAT Score   CAT Score  22  -  18     mMRC Score   mMRC Score  2 Simultaneous filing. User may not have seen previous data.  -  -      Initial Exercise Prescription: Initial Exercise Prescription - 10/09/16 1500      Date of Initial Exercise RX and Referring Provider   Date  10/09/16    Referring Provider  [name] MD      Treadmill   MPH  2.2    Grade  0    Minutes  15    METs  2.68      NuStep   Level  2    SPM  80    Minutes  15    METs  2      REL-XR   Level  1    Speed  50    Minutes  15    METs  2      Prescription Details   Frequency (times per week)  3    Duration  Progress to 45 minutes of aerobic exercise without signs/symptoms of physical distress      Intensity   THRR 40-80% of Max Heartrate  99-126    Ratings of Perceived Exertion  11-13    Perceived Dyspnea  0-4      Progression   Progression  Continue to progress workloads to maintain intensity without signs/symptoms of physical distress.      Resistance Training   Training Prescription  Yes    Weight  3 lbs    Reps   10-15       Discharge Exercise Prescription (Final Exercise Prescription Changes): Exercise Prescription Changes - 01/23/17 1400      Response to Exercise   Blood Pressure (Admit)  124/62    Blood Pressure (Exercise)  138/60    Blood Pressure (Exit)  124/68    Heart Rate (Admit)  77 bpm    Heart Rate (Exercise)  107 bpm    Heart Rate (Exit)  70 bpm    Oxygen Saturation (Admit)  99 %    Oxygen Saturation (Exercise)  93 %    Oxygen Saturation (Exit)  99 %    Rating of Perceived Exertion (Exercise)  12  Perceived Dyspnea (Exercise)  2    Symptoms  none    Duration  Continue with 45 min of aerobic exercise without signs/symptoms of physical distress.    Intensity  THRR unchanged      Progression   Progression  Continue to progress workloads to maintain intensity without signs/symptoms of physical distress.    Average METs  2.8      Resistance Training   Training Prescription  Yes    Weight  4 lbs    Reps  10-15      Interval Training   Interval Training  No      Treadmill   MPH  2.5    Grade  0.5    Minutes  15    METs  3.09      NuStep   Level  5    SPM  78    Minutes  15    METs  3.5      REL-XR   Level  4    Speed  40    Minutes  15    METs  1.8      Home Exercise Plan   Plans to continue exercise at  Home (comment)    Frequency  Add 2 additional days to program exercise sessions.    Initial Home Exercises Provided  11/03/16       Functional Capacity: 6 Minute Walk    Row Name 10/09/16 1551 01/19/17 1356       6 Minute Walk   Phase  Initial  Discharge    Distance  1250 feet  1440 feet    Distance % Change  -  15.2 %    Distance Feet Change  -  190 ft    Walk Time  6 minutes  6 minutes    # of Rest Breaks  0  0    MPH  2.37  2.72    METS  2.72  2.96    RPE  12  12    Perceived Dyspnea   2  2    VO2 Peak  9.5  10.34    Symptoms  Yes (comment)  Yes (comment)    Comments  SOB,  Right sided chest pain 2/10  back pain, L arm weak    Resting HR   73 bpm  77 bpm    Resting BP  146/64  124/62    Resting Oxygen Saturation   -  99 %    Exercise Oxygen Saturation  during 6 min walk  -  96 %    Max Ex. HR  96 bpm  107 bpm    Max Ex. BP  166/64  138/60    2 Minute Post BP  132/66  136/60      Interval HR   Baseline HR (retired)  73  -    1 Minute HR  94  104    2 Minute HR  96  106    3 Minute HR  95  107    4 Minute HR  94  104    5 Minute HR  94  -    6 Minute HR  94  101    2 Minute Post HR  74  72    Interval Heart Rate?  Yes  Yes      Interval Oxygen   Interval Oxygen?  Yes  Yes    Baseline Oxygen Saturation %  96 %  99 %  Resting Liters of Oxygen  0 L Room Air  -    1 Minute Oxygen Saturation %  97 %  98 %    1 Minute Liters of Oxygen  0 L  0 L Room Air    2 Minute Oxygen Saturation %  96 %  98 %    2 Minute Liters of Oxygen  0 L  0 L    3 Minute Oxygen Saturation %  97 %  100 %    3 Minute Liters of Oxygen  0 L  0 L    4 Minute Oxygen Saturation %  97 %  99 %    4 Minute Liters of Oxygen  0 L  0 L    5 Minute Oxygen Saturation %  97 %  96 %    5 Minute Liters of Oxygen  0 L  0 L    6 Minute Oxygen Saturation %  97 %  97 %    6 Minute Liters of Oxygen  0 L  0 L    2 Minute Post Oxygen Saturation %  99 %  98 %    2 Minute Post Liters of Oxygen  0 L  0 L       Psychological, QOL, Others - Outcomes: PHQ 2/9: Depression screen [facility] 2/9 01/17/2017 01/12/2017 11/24/2016 10/09/2016 10/07/2015  Decreased Interest 0 0 2 3 0  Down, Depressed, Hopeless 0 0 0 1 0  PHQ - 2 Score 0 0 2 4 0  Altered sleeping 0 _0 -  Tired, decreased energy 0 _1 -  Change in appetite 0 1 2 0 -  Feeling bad or failure about yourself  0 0 1 2 -  Trouble concentrating 0 0 1 1 -  Moving slowly or fidgety/restless 0 _2 -  Suicidal thoughts 0 0 0 0 -  PHQ-9 Score 0 _3 -  Difficult doing work/chores Not difficult at all Somewhat difficult Somewhat difficult Extremely dIfficult -    Quality of Life:   Personal Goals: Goals  established at orientation with interventions provided to work toward goal. Personal Goals and Risk Factors at Admission - 10/09/16 1453      Core Components/Risk Factors/Patient Goals on Admission    Weight Management  Yes    Intervention  Weight Management: Develop a combined nutrition and exercise program designed to reach desired caloric intake, while maintaining appropriate intake of nutrient and fiber, sodium and fats, and appropriate energy expenditure required for the weight goal.;Weight Management: Provide education and appropriate resources to help participant work on and attain dietary goals.;Weight Management/Obesity: Establish reasonable short term and long term weight goals.    Admit Weight  146 lb (66.2 kg)    Goal Weight: Short Term  140 lb (63.5 kg)    Goal Weight: Long Term  140 lb (63.5 kg)    Expected Outcomes  Short Term: Continue to assess and modify interventions until short term weight is achieved;Long Term: Adherence to nutrition and physical activity/exercise program aimed toward attainment of established weight goal;Weight Maintenance: Understanding of the daily nutrition guidelines, which includes 25-35% calories from fat, 7% or less cal from saturated fats, less than 223m cholesterol, less than 1.5gm of sodium, & 5 or more servings of fruits and vegetables daily;Weight Loss: Understanding of general recommendations for a balanced deficit meal plan, which promotes 1-2 lb weight loss per week and includes a negative energy balance of [phone] kcal/d;Understanding recommendations  for meals to include 15-35% energy as protein, 25-35% energy from fat, 35-60% energy from carbohydrates, less than 232m of dietary cholesterol, 20-35 gm of total fiber daily;Understanding of distribution of calorie intake throughout the day with the consumption of 4-5 meals/snacks    Improve shortness of breath with ADL's  Yes    Intervention  Provide education, individualized exercise plan and daily  activity instruction to help decrease symptoms of SOB with activities of daily living.    Expected Outcomes  Short Term: Achieves a reduction of symptoms when performing activities of daily living.    Develop more efficient breathing techniques such as purse lipped breathing and diaphragmatic breathing; and practicing self-pacing with activity  Yes    Intervention  Provide education, demonstration and support about specific breathing techniuqes utilized for more efficient breathing. Include techniques such as pursed lipped breathing, diaphragmatic breathing and self-pacing activity.    Expected Outcomes  Short Term: Participant will be able to demonstrate and use breathing techniques as needed throughout daily activities.    Increase knowledge of respiratory medications and ability to use respiratory devices properly   Yes    Intervention  Provide education and demonstration as needed of appropriate use of medications, inhalers, and oxygen therapy.    Expected Outcomes  Short Term: Achieves understanding of medications use. Understands that oxygen is a medication prescribed by physician. Demonstrates appropriate use of inhaler and oxygen therapy.    Heart Failure  Yes    Intervention  Provide a combined exercise and nutrition program that is supplemented with education, support and counseling about heart failure. Directed toward relieving symptoms such as shortness of breath, decreased exercise tolerance, and extremity edema.    Expected Outcomes  Improve functional capacity of life;Short term: Attendance in program 2-3 days a week with increased exercise capacity. Reported lower sodium intake. Reported increased fruit and vegetable intake. Reports medication compliance.;Short term: Daily weights obtained and reported for increase. Utilizing diuretic protocols set by physician.;Long term: Adoption of self-care skills and reduction of barriers for early signs and symptoms recognition and intervention  leading to self-care maintenance.    Lipids  Yes    Intervention  Provide education and support for participant on nutrition & aerobic/resistive exercise along with prescribed medications to achieve LDL <717m HDL >4039m   Expected Outcomes  Short Term: Participant states understanding of desired cholesterol values and is compliant with medications prescribed. Participant is following exercise prescription and nutrition guidelines.;Long Term: Cholesterol controlled with medications as prescribed, with individualized exercise RX and with personalized nutrition plan. Value goals: LDL < 63m18mDL > 40 mg.    Stress  Yes    Intervention  Offer individual and/or small group education and counseling on adjustment to heart disease, stress management and health-related lifestyle change. Teach and support self-help strategies.;Refer participants experiencing significant psychosocial distress to appropriate mental health specialists for further evaluation and treatment. When possible, include family members and significant others in education/counseling sessions.    Expected Outcomes  Short Term: Participant demonstrates changes in health-related behavior, relaxation and other stress management skills, ability to obtain effective social support, and compliance with psychotropic medications if prescribed.;Long Term: Emotional wellbeing is indicated by absence of clinically significant psychosocial distress or social isolation.        Personal Goals Discharge: Goals and Risk Factor Review    Row Name 11/15/16 1205 12/20/16 1535 01/15/17 1206         Core Components/Risk Factors/Patient Goals Review   Personal Goals Review  Heart Failure;Stress;Lipids;Weight Management/Obesity;Improve shortness of breath with ADL's  Weight Management/Obesity;Improve shortness of breath with ADL's;Heart Failure;Stress  Weight Management/Obesity;Improve shortness of breath with ADL's;Heart Failure;Stress     Review  Trenesha  states the program has helped her alot since she started. Her dizziness has been better but is still lingers. She is taking her medication prescribed by the doctors with no changes currently.  Shellee states [facility] has helped her with her breathing, ADL's and her overall health. Her brother inlaw has died recently which was very hard for her.  Diego states that she does not get stressed out. Her ADL's have improved but yesterday she had a rough day. Her weight has been stable and she wants to maintain weight. She has an appointment for blood work to be done this week.  Jolena says exercise has helped her alot  with her mood and being able to be social.     Expected Outcomes  Short: minimize stress, maintain weight, SOB with ADL's. Get her lipids checked. Long: Maintain stress by contiuing to exercise after [facility].   Short: continue to attend [facility] to decrease stress. Long: Graduate [facility] and maintain ADL.  Short: improve ADL's by working increasing levels on exercises. Long: maintain independent exercise.        Exercise Goals and Review: Exercise Goals    Row Name 10/09/16 1555             Exercise Goals   Increase Physical Activity  Yes       Intervention  Provide advice, education, support and counseling about physical activity/exercise needs.;Develop an individualized exercise prescription for aerobic and resistive training based on initial evaluation findings, risk stratification, comorbidities and participant's personal goals.       Expected Outcomes  Achievement of increased cardiorespiratory fitness and enhanced flexibility, muscular endurance and strength shown through measurements of functional capacity and personal statement of participant.       Increase Strength and Stamina  Yes       Intervention  Provide advice, education, support and counseling about physical activity/exercise needs.;Develop an individualized exercise prescription for aerobic and resistive training  based on initial evaluation findings, risk stratification, comorbidities and participant's personal goals.       Expected Outcomes  Achievement of increased cardiorespiratory fitness and enhanced flexibility, muscular endurance and strength shown through measurements of functional capacity and personal statement of participant.          Nutrition & Weight - Outcomes: Pre Biometrics - 10/09/16 1556      Pre Biometrics   Height  5' 7.5" (1.715 m)    Weight  146 lb 12.8 oz (66.6 kg)    Waist Circumference  31 inches    Hip Circumference  36.5 inches    Waist to Hip Ratio  0.85 %    BMI (Calculated)  22.7        Nutrition: Nutrition Therapy & Goals - 01/15/17 1204      Nutrition Therapy   RD appointment defered  Yes       Nutrition Discharge: Nutrition Assessments - 01/12/17 1155      MEDFICTS Scores   Pre Score  62       Education Questionnaire Score: Knowledge Questionnaire Score - 01/12/17 1153      Knowledge Questionnaire Score   Pre Score  5/10    Post Score  7/10 Reviewed with patient       Goals reviewed with patient; copy given to patient.

## 2017-01-29 NOTE — Progress Notes (Signed)
Pulmonary Individual Treatment Plan  Patient Details  Name: Sierra Kaiser MRN: [id number] Date of Birth: [birth year] Referring Provider:     Pulmonary Rehab from 10/09/2016 in [facility] Cardiac and Pulmonary Rehab  Referring Provider  [name] MD      Initial Encounter Date:    Pulmonary Rehab from 10/09/2016 in [facility] Cardiac and Pulmonary Rehab  Date  10/09/16  Referring Provider  [name] MD      Visit Diagnosis: Chronic diastolic congestive heart failure ([place])  Patient's Home Medications on Admission:  Current Outpatient Medications:  .  Biotin (BIOTIN 5000) 5 MG CAPS, Take by mouth., Disp: , Rfl:  .  diphenhydrAMINE-zinc acetate (BENADRYL) cream, Apply 1 application topically 2 (two) times daily as needed for itching., Disp: , Rfl:  .  fluticasone (FLONASE) 50 MCG/ACT nasal spray, , Disp: , Rfl:  .  furosemide (LASIX) 40 MG tablet, Take 40 mg by mouth daily., Disp: , Rfl:  .  omeprazole (PRILOSEC) 40 MG capsule, Take 1 capsule (40 mg total) by mouth daily., Disp: 90 capsule, Rfl: 3 .  Polyethyl Glycol-Propyl Glycol (SYSTANE OP), Place 1 drop into both eyes daily as needed (dry eyes)., Disp: , Rfl:  .  Rivaroxaban (XARELTO) 15 MG TABS tablet, Take 1 tablet (15 mg total) by mouth daily with supper., Disp: 90 tablet, Rfl: 3 .  rosuvastatin (CRESTOR) 20 MG tablet, TAKE 1 TABLET DAILY, Disp: 90 tablet, Rfl: 2  Past Medical History: Past Medical History:  Diagnosis Date  . Anxiety   . CAD (coronary artery disease)   . CKD stage 3 secondary to diabetes ([place])   . Esophageal reflux   . GI bleeding 2012   during EGD necessitating open surgery  . Hiatal hernia   . History of blood transfusion   . History of diverticulitis   . History of UTI   . HTN (hypertension)   . Hyperlipidemia   . Meniere's disease   . Mitral regurgitation   . Osteopenia 12/11/2015   T score -1.2 femur, -2.0 spine (12/2015)  . Permanent atrial fibrillation ([place])    a. permanent b. s/p PVI RFA at [facility]  10/14 c. failed amio (neuro toxicity) and Tikosyn d. 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 PUD (peptic ulcer disease)   . Raynaud's disease   . SNHL (sensorineural hearing loss)    right ear  . Syncopal episodes   . Tinnitus   . Ulcerative colitis ([place])    per prior records  . Urinary incontinence, urge     Tobacco Use: Social History   Tobacco Use  Smoking Status Never Smoker  Smokeless Tobacco Never Used    Labs: Recent Review Flowsheet Data    Labs for ITP Cardiac and Pulmonary Rehab Latest Ref Rng & Units 03/26/2014 10/07/2015 08/15/2016   Cholestrol 100 - 199 mg/dL 293(A) 203(H) 159   LDLCALC 0 - 99 mg/dL 206 136(H) 73   HDL >39 mg/dL 45 42.50 57   Trlycerides 0 - 149 mg/dL 210(A) 125.0 143       Pulmonary Assessment Scores: Pulmonary Assessment Scores    Row Name 10/09/16 1446 11/24/16 1232 01/12/17 1154     ADL UCSD   ADL Phase  Entry  Mid  Exit   SOB Score total  63  45  56   Rest  0  0  0   Walk  _0 Stairs  4  3  3   Bath  _0 Dress  _1 Shop  _2 CAT Score   CAT Score  22  -  18     mMRC Score   mMRC Score  2 Simultaneous filing. User may not have seen previous data.  -  -      Pulmonary Function Assessment: Pulmonary Function Assessment - 10/09/16 1559      Breath   Bilateral Breath Sounds  Clear    Shortness of Breath  Fear of Shortness of Breath       Exercise Target Goals:    Exercise Program Goal: Individual exercise prescription set with THRR, safety & activity barriers. Participant demonstrates ability to understand and report RPE using BORG scale, to self-measure pulse accurately, and to acknowledge the importance of the exercise prescription.  Exercise Prescription Goal: Starting with aerobic activity 30 plus minutes a day, 3 days per week for initial exercise prescription. Provide home exercise prescription and guidelines that participant acknowledges understanding  prior to discharge.  Activity Barriers & Risk Stratification: Activity Barriers & Cardiac Risk Stratification - 10/09/16 1553      Activity Barriers & Cardiac Risk Stratification   Activity Barriers  Deconditioning;Muscular Weakness;Shortness of Breath       6 Minute Walk: 6 Minute Walk    Row Name 10/09/16 1551 01/19/17 1356       6 Minute Walk   Phase  Initial  Discharge    Distance  1250 feet  1440 feet    Distance % Change  -  15.2 %    Distance Feet Change  -  190 ft    Walk Time  6 minutes  6 minutes    # of Rest Breaks  0  0    MPH  2.37  2.72    METS  2.72  2.96    RPE  12  12    Perceived Dyspnea   2  2    VO2 Peak  9.5  10.34    Symptoms  Yes (comment)  Yes (comment)    Comments  SOB,  Right sided chest pain 2/10  back pain, L arm weak    Resting HR  73 bpm  77 bpm    Resting BP  146/64  124/62    Resting Oxygen Saturation   -  99 %    Exercise Oxygen Saturation  during 6 min walk  -  96 %    Max Ex. HR  96 bpm  107 bpm    Max Ex. BP  166/64  138/60    2 Minute Post BP  132/66  136/60      Interval HR   Baseline HR (retired)  73  -    1 Minute HR  94  104    2 Minute HR  96  106    3 Minute HR  95  107    4 Minute HR  94  104    5 Minute HR  94  -    6 Minute HR  94  101    2 Minute Post HR  74  72    Interval Heart Rate?  Yes  Yes      Interval Oxygen   Interval Oxygen?  Yes  Yes    Baseline Oxygen Saturation %  96 %  99 %    Resting Liters  of Oxygen  0 L Room Air  -    1 Minute Oxygen Saturation %  97 %  98 %    1 Minute Liters of Oxygen  0 L  0 L Room Air    2 Minute Oxygen Saturation %  96 %  98 %    2 Minute Liters of Oxygen  0 L  0 L    3 Minute Oxygen Saturation %  97 %  100 %    3 Minute Liters of Oxygen  0 L  0 L    4 Minute Oxygen Saturation %  97 %  99 %    4 Minute Liters of Oxygen  0 L  0 L    5 Minute Oxygen Saturation %  97 %  96 %    5 Minute Liters of Oxygen  0 L  0 L    6 Minute Oxygen Saturation %  97 %  97 %    6 Minute  Liters of Oxygen  0 L  0 L    2 Minute Post Oxygen Saturation %  99 %  98 %    2 Minute Post Liters of Oxygen  0 L  0 L      Oxygen Initial Assessment: Oxygen Initial Assessment - 10/09/16 1450      Home Oxygen   Home Oxygen Device  None    Sleep Oxygen Prescription  None    Home Exercise Oxygen Prescription  None    Home at Rest Exercise Oxygen Prescription  None      Intervention   Short Term Goals  To learn and understand importance of monitoring SPO2 with pulse oximeter and demonstrate accurate use of the pulse oximeter.;To Learn and understand importance of maintaining oxygen saturations>88%;To learn and demonstrate proper purse lipped breathing techniques or other breathing techniques. does not have any respiratory medications    Long  Term Goals  Maintenance of O2 saturations>88%;Exhibits proper breathing techniques, such as purse lipped breathing or other method taught during program session;Verbalizes importance of monitoring SPO2 with pulse oximeter and return demonstration       Oxygen Re-Evaluation: Oxygen Re-Evaluation    Row Name 11/15/16 1210 12/20/16 1526 01/12/17 1343         Program Oxygen Prescription   Program Oxygen Prescription  None  None  None       Home Oxygen   Home Oxygen Device  None  None  None     Sleep Oxygen Prescription  None  None  None     Home Exercise Oxygen Prescription  None  None  None     Home at Rest Exercise Oxygen Prescription  None  None  None       Goals/Expected Outcomes   Short Term Goals  To learn and understand importance of monitoring SPO2 with pulse oximeter and demonstrate accurate use of the pulse oximeter.;To learn and understand importance of maintaining oxygen saturations>88%;To learn and demonstrate proper pursed lip breathing techniques or other breathing techniques.  To learn and understand importance of maintaining oxygen saturations>88%;To learn and demonstrate proper pursed lip breathing techniques or other  breathing techniques.;To learn and understand importance of monitoring SPO2 with pulse oximeter and demonstrate accurate use of the pulse oximeter.  To learn and understand importance of maintaining oxygen saturations>88%;To learn and demonstrate proper pursed lip breathing techniques or other breathing techniques.;To learn and understand importance of monitoring SPO2 with pulse oximeter and demonstrate accurate use of the pulse oximeter.  Long  Term Goals  Verbalizes importance of monitoring SPO2 with pulse oximeter and return demonstration;Maintenance of O2 saturations>88%;Exhibits proper breathing techniques, such as pursed lip breathing or other method taught during program session;Compliance with respiratory medication  Exhibits proper breathing techniques, such as pursed lip breathing or other method taught during program session;Verbalizes importance of monitoring SPO2 with pulse oximeter and return demonstration;Maintenance of O2 saturations>88%  Verbalizes importance of monitoring SPO2 with pulse oximeter and return demonstration;Maintenance of O2 saturations>88%;Exhibits proper breathing techniques, such as pursed lip breathing or other method taught during program session     Comments  [employer] like somedays she has improved and other days she feels like she has not. Her oxygen has been above 88 percent and has never dropped below 92 percent. She has practiced PLB breathing at home a little bit. Informed her to het a pulse oximeter to check her oxygen.  Ryn states that her breathng has improved and increased her socialization. She can now walk up the stairs at church more efficiently. Her oxygen never falls below 88 percent when exercising. She does not use PLB in class unless informed to.  Tersea has been using her PLB at home more. She understands her oxygen should be above 88 percent. She does not take any respiratory medications or use oxygen at home.      Goals/Expected Outcomes  Short:  work on PLB techniques. Obtain a pulse oximeter.  Long: Be proficient in PLB. Be proficient in checking her oxygen independently.  Short: Work on increasing exercise levels to increase ADL's. Long: Maintain exercise to increase ADL  Short: work on PLB more in class and at home. Long: Maintain PLB techniques post [facility]        Oxygen Discharge (Final Oxygen Re-Evaluation): Oxygen Re-Evaluation - 01/12/17 1343      Program Oxygen Prescription   Program Oxygen Prescription  None      Home Oxygen   Home Oxygen Device  None    Sleep Oxygen Prescription  None    Home Exercise Oxygen Prescription  None    Home at Rest Exercise Oxygen Prescription  None      Goals/Expected Outcomes   Short Term Goals  To learn and understand importance of maintaining oxygen saturations>88%;To learn and demonstrate proper pursed lip breathing techniques or other breathing techniques.;To learn and understand importance of monitoring SPO2 with pulse oximeter and demonstrate accurate use of the pulse oximeter.    Long  Term Goals  Verbalizes importance of monitoring SPO2 with pulse oximeter and return demonstration;Maintenance of O2 saturations>88%;Exhibits proper breathing techniques, such as pursed lip breathing or other method taught during program session    Comments  Dann has been using her PLB at home more. She understands her oxygen should be above 88 percent. She does not take any respiratory medications or use oxygen at home.     Goals/Expected Outcomes  Short: work on PLB more in class and at home. Long: Maintain PLB techniques post [facility]       Initial Exercise Prescription: Initial Exercise Prescription - 10/09/16 1500      Date of Initial Exercise RX and Referring Provider   Date  10/09/16    Referring Provider  [name] MD      Treadmill   MPH  2.2    Grade  0    Minutes  15    METs  2.68      NuStep   Level  2    SPM  80  Minutes  15    METs  2      REL-XR   Level  1     Speed  50    Minutes  15    METs  2      Prescription Details   Frequency (times per week)  3    Duration  Progress to 45 minutes of aerobic exercise without signs/symptoms of physical distress      Intensity   THRR 40-80% of Max Heartrate  99-126    Ratings of Perceived Exertion  11-13    Perceived Dyspnea  0-4      Progression   Progression  Continue to progress workloads to maintain intensity without signs/symptoms of physical distress.      Resistance Training   Training Prescription  Yes    Weight  3 lbs    Reps  10-15       Perform Capillary Blood Glucose checks as needed.  Exercise Prescription Changes: Exercise Prescription Changes    Row Name 10/09/16 1500 10/17/16 1500 10/27/16 1300 11/01/16 1500 11/03/16 1200     Response to Exercise   Blood Pressure (Admit)  146/64  102/62  -  136/70  -   Blood Pressure (Exercise)  166/64  146/50  -  -  -   Blood Pressure (Exit)  132/66  130/62  -  106/70  -   Heart Rate (Admit)  73 bpm  76 bpm  -  82 bpm  -   Heart Rate (Exercise)  96 bpm  77 bpm  -  93 bpm  -   Heart Rate (Exit)  74 bpm  74 bpm  -  51 bpm  -   Oxygen Saturation (Admit)  96 %  96 %  -  97 %  -   Oxygen Saturation (Exercise)  96 %  99 %  -  95 %  -   Oxygen Saturation (Exit)  99 %  99 %  -  96 %  -   Rating of Perceived Exertion (Exercise)  12  15  -  13  -   Perceived Dyspnea (Exercise)  2  3  -  2  -   Symptoms  SOB, Right sided chest pain 2/10  -  -  none  -   Comments  walk test results  -  -  -  -   Duration  -  Continue with 45 min of aerobic exercise without signs/symptoms of physical distress.  -  Continue with 45 min of aerobic exercise without signs/symptoms of physical distress.  -   Intensity  -  THRR unchanged  -  THRR unchanged  -     Progression   Progression  -  Continue to progress workloads to maintain intensity without signs/symptoms of physical distress.  -  Continue to progress workloads to maintain intensity without signs/symptoms of  physical distress.  -   Average METs  -  2.16  -  2.33  -     Resistance Training   Training Prescription  -  Yes  -  Yes  -   Weight  -  3 lbs  -  3 lbs  -   Reps  -  10-15  -  10-15  -     Interval Training   Interval Training  -  No  -  No  -     Treadmill   MPH  -  2.2  -  2.2  -  Grade  -  0  -  0  -   Minutes  -  15  -  15  -   METs  -  2.68  -  2.68  -     NuStep   Level  -  2  -  2  -   SPM  -  76  -  77  -   Minutes  -  15  -  15  -   METs  -  2.1  -  1.98  -     REL-XR   Level  -  1  -  3  -   Speed  -  50  -  40  -   Minutes  -  15  -  15  -   METs  -  1.7  -  2.4  -     Home Exercise Plan   Plans to continue exercise at  -  -  Home (comment) walking at home  Home (comment) walking at home  Home (comment)   Frequency  -  -  Add 1 additional day to program exercise sessions.  Add 1 additional day to program exercise sessions.  Add 1 additional day to program exercise sessions.   Initial Home Exercises Provided  -  -  10/27/16  10/27/16  11/03/16   Row Name 11/14/16 1300 11/28/16 1400 12/12/16 1400 12/27/16 1500 01/09/17 1500     Response to Exercise   Blood Pressure (Admit)  110/62  120/70  128/64  108/56  124/62   Blood Pressure (Exit)  102/56  122/70  126/64  102/58  110/58   Heart Rate (Admit)  79 bpm  73 bpm  96 bpm  78 bpm  73 bpm   Heart Rate (Exercise)  91 bpm  97 bpm  82 bpm  82 bpm  85 bpm   Heart Rate (Exit)  68 bpm  70 bpm  82 bpm  83 bpm  70 bpm   Oxygen Saturation (Admit)  99 %  97 %  97 %  99 %  99 %   Oxygen Saturation (Exercise)  99 %  96 %  99 %  94 %  98 %   Oxygen Saturation (Exit)  97 %  97 %  99 %  99 %  99 %   Rating of Perceived Exertion (Exercise)  _0 Perceived Dyspnea (Exercise)  _1 Symptoms  none  none  none  none  none   Duration  Continue with 45 min of aerobic exercise without signs/symptoms of physical distress.  Continue with 45 min of aerobic exercise without signs/symptoms of physical distress.   Continue with 45 min of aerobic exercise without signs/symptoms of physical distress.  Continue with 45 min of aerobic exercise without signs/symptoms of physical distress.  Continue with 45 min of aerobic exercise without signs/symptoms of physical distress.   Intensity  THRR unchanged  THRR unchanged  THRR unchanged  THRR unchanged  THRR unchanged     Progression   Progression  Continue to progress workloads to maintain intensity without signs/symptoms of physical distress.  Continue to progress workloads to maintain intensity without signs/symptoms of physical distress.  Continue to progress workloads to maintain intensity without signs/symptoms of physical distress.  Continue to progress workloads to maintain intensity without signs/symptoms of physical distress.  Continue to  progress workloads to maintain intensity without signs/symptoms of physical distress.   Average METs  2.27  2.33  2.64  2.56  2.62     Resistance Training   Training Prescription  Yes  Yes  Yes  Yes  Yes   Weight  4 lbs  4 lbs  4 lbs  4 lbs  4 lbs   Reps  10-15  10-15  10-15  10-15  10-15     Interval Training   Interval Training  No  No  No  No  No     Treadmill   MPH  2.2  2.5  2.5  2.5  2   Grade  0  0.5  0  0  0.5   Minutes  _0 METs  2.68  3.09  2.91  2.91  2.67     NuStep   Level  _1 SPM  88  70  80  74  68   Minutes  _2 METs  2.3  2  2.9  2.2  2.9     REL-XR   Level  _3 Speed  39  34  58  3.9  40   Minutes  _4 METs  1.9  1.9  2.1  2.2  2.3     Home Exercise Plan   Plans to continue exercise at  Home (comment)  Home (comment)  Home (comment)  Home (comment)  Home (comment)   Frequency  Add 1 additional day to program exercise sessions.  Add 1 additional day to program exercise sessions.  Add 2 additional days to program exercise sessions.  Add 2 additional days to program exercise sessions.  Add 2 additional days to  program exercise sessions.   Initial Home Exercises Provided  11/03/16  11/03/16  11/03/16  11/03/16  11/03/16   Row Name 01/23/17 1400             Response to Exercise   Blood Pressure (Admit)  124/62       Blood Pressure (Exercise)  138/60       Blood Pressure (Exit)  124/68       Heart Rate (Admit)  77 bpm       Heart Rate (Exercise)  107 bpm       Heart Rate (Exit)  70 bpm       Oxygen Saturation (Admit)  99 %       Oxygen Saturation (Exercise)  93 %       Oxygen Saturation (Exit)  99 %       Rating of Perceived Exertion (Exercise)  12       Perceived Dyspnea (Exercise)  2       Symptoms  none       Duration  Continue with 45 min of aerobic exercise without signs/symptoms of physical distress.       Intensity  THRR unchanged         Progression   Progression  Continue to progress workloads to maintain intensity without signs/symptoms of physical distress.       Average METs  2.8         Resistance Training   Training Prescription  Yes  Weight  4 lbs       Reps  10-15         Interval Training   Interval Training  No         Treadmill   MPH  2.5       Grade  0.5       Minutes  15       METs  3.09         NuStep   Level  5       SPM  78       Minutes  15       METs  3.5         REL-XR   Level  4       Speed  40       Minutes  15       METs  1.8         Home Exercise Plan   Plans to continue exercise at  Home (comment)       Frequency  Add 2 additional days to program exercise sessions.       Initial Home Exercises Provided  11/03/16          Exercise Comments: Exercise Comments    Row Name 10/11/16 1244           Exercise Comments  First full day of exercise!  Patient was oriented to gym and equipment including functions, settings, policies, and procedures.  Patient's individual exercise prescription and treatment plan were reviewed.  All starting workloads were established based on the results of the 6 minute walk test done at initial  orientation visit.  The plan for exercise progression was also introduced and progression will be customized based on patient's performance and goals.          Exercise Goals and Review: Exercise Goals    Row Name 10/09/16 1555             Exercise Goals   Increase Physical Activity  Yes       Intervention  Provide advice, education, support and counseling about physical activity/exercise needs.;Develop an individualized exercise prescription for aerobic and resistive training based on initial evaluation findings, risk stratification, comorbidities and participant's personal goals.       Expected Outcomes  Achievement of increased cardiorespiratory fitness and enhanced flexibility, muscular endurance and strength shown through measurements of functional capacity and personal statement of participant.       Increase Strength and Stamina  Yes       Intervention  Provide advice, education, support and counseling about physical activity/exercise needs.;Develop an individualized exercise prescription for aerobic and resistive training based on initial evaluation findings, risk stratification, comorbidities and participant's personal goals.       Expected Outcomes  Achievement of increased cardiorespiratory fitness and enhanced flexibility, muscular endurance and strength shown through measurements of functional capacity and personal statement of participant.          Exercise Goals Re-Evaluation : Exercise Goals Re-Evaluation    Row Name 10/17/16 1511 10/27/16 1346 11/01/16 1506 11/03/16 1230 11/14/16 1351     Exercise Goal Re-Evaluation   Exercise Goals Review  Increase Physical Activity;Increase Strenth and Stamina  Increase Physical Activity;Increase Strenth and Stamina  Increase Physical Activity;Increase Strength and Stamina  Increase Physical Activity;Increase Strength and Stamina;Knowledge and understanding of Target Heart Rate Range (THRR);Able to check pulse independently;Understanding  of Exercise Prescription  Increase Physical Activity;Increase Strength and Stamina   Comments  Geraline is off to a good start in rehab.  She has already completed 3 full days of exercise!!  We will continue to monitor her progression.   Reviewed home exercise with pt today.  Pt plans to by walking at home for exercise.  Reviewed THR, pulse, RPE, sign and symptoms, NTG use, and when to call 911 or MD.  Also discussed weather considerations and indoor options.  Pt voiced understanding.  Novie continues to do well in rehab. She is now up to level 3 on the recumbent elliptical!  She continues to make improvements and wants to work hard.  We will continue to monitor her progress.   Reviewed home exercise with pt today.  Pt plans to walk at home and use weights for exercise.  Reviewed THR, pulse, RPE, sign and symptoms, and when to call 911 or MD.  Also discussed weather considerations and indoor options.  Also discussed importance of purchasing a pulse oximeter to monitor oxygen saturations at home and keep above 88%.  Pt voiced understanding.  Caylei has been doing well in rehab.  She is now up to level 3 on the recumbent ellipitcal and NuStep.  She continues to bounce during stretching and needs constant reminding to stretch slowly.  We will continue to monitor her progress.    Expected Outcomes  Short: Continue to attend rehab classes.  Long: Make exercise part of daily routine.   Short: add walking to home exercise and continue exercising in class. Long: Continue walking and adding home exercise post [facility]  Short: Continue to walk more at home.  Long: Continue to work on Scientist, research (life sciences).   Short: Add in at least one extra day at home for walking or join grandson's gym.  Long: Make exercise part of routine.   Short: Try to stretch better in class.  Long: Continue work on Education administrator.   [place] Name 11/28/16 1453 12/12/16 1405 12/27/16 1539 01/09/17 1520 01/23/17 1423     Exercise  Goal Re-Evaluation   Exercise Goals Review  Increase Physical Activity;Increase Strength and Stamina  Increase Physical Activity;Increase Strength and Stamina  Increase Physical Activity;Increase Strength and Stamina  Increase Physical Activity;Increase Strength and Stamina;Understanding of Exercise Prescription  Increase Physical Activity;Increase Strength and Stamina;Understanding of Exercise Prescription   Comments  Caila continues to do well in rehab.  She is now doing level 4 on the recumbent elliptical.  She still need reminding to follow along with class.  We will continue to monitor her progress.   Jameson has been doing well in rehab.  She has worked her way up to level 5 on te NuStep.  She has fallen back down to level 1 on the recumbent elliptical and we will talk to her about moving back up.  We will continue to monitor her progres.   Bryssa continues to do well in rehab.  She is up to level 4 on the recumbent elliptical.  We will try to push her a little more.  We will continue to monitor her progression.   Chelsey has done well in rehab.  She is already nearing graduation!!  She is up to level 6 on the NuStep now.  We will continue to monitor her progress.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 activities of daily living.    Develop more efficient breathing techniques such as purse lipped breathing and diaphragmatic breathing; and practicing self-pacing with activity  Yes    Intervention  Provide education, demonstration and support about specific breathing techniuqes utilized for more efficient breathing. Include techniques such as pursed lipped breathing, diaphragmatic breathing and self-pacing activity.    Expected Outcomes  Short Term: Participant will be able to demonstrate and use breathing techniques as needed throughout daily activities.    Increase knowledge of respiratory medications and ability to use respiratory devices properly   Yes    Intervention  Provide education and demonstration as needed of appropriate use of medications, inhalers, and oxygen therapy.    Expected Outcomes  Short Term: Achieves understanding of medications use. Understands that oxygen is a medication prescribed by physician. Demonstrates appropriate use of inhaler and oxygen therapy.    Heart Failure  Yes    Intervention  Provide a combined exercise and nutrition program that is supplemented with education, support and counseling about heart failure. Directed toward relieving symptoms such as shortness of breath, decreased exercise tolerance, and extremity edema.    Expected Outcomes  Improve functional capacity of life;Short term: Attendance in program 2-3 days a week with increased exercise capacity. Reported lower sodium intake. Reported increased fruit and vegetable intake. Reports medication compliance.;Short term: Daily weights obtained and reported for increase. Utilizing diuretic protocols set by physician.;Long term: Adoption of self-care skills and reduction of barriers for early signs and symptoms recognition and intervention leading to self-care maintenance.    Lipids  Yes     Intervention  Provide education and support for participant on nutrition & aerobic/resistive exercise along with prescribed medications to achieve LDL <61m, HDL >458m    Expected Outcomes  Short Term: Participant states understanding of desired cholesterol values and is compliant with medications prescribed. Participant is following exercise prescription and nutrition guidelines.;Long Term: Cholesterol controlled with medications as prescribed, with individualized exercise RX and with personalized nutrition plan. Value goals: LDL < 7068mHDL > 40 mg.    Stress  Yes    Intervention  Offer individual and/or small group education and counseling on adjustment to heart disease, stress management and health-related lifestyle change. Teach and support self-help strategies.;Refer participants experiencing significant psychosocial distress to appropriate mental health specialists for further evaluation and treatment. When possible, include family members and significant others in education/counseling sessions.    Expected Outcomes  Short Term: Participant demonstrates changes in health-related behavior, relaxation and other stress management skills, ability to obtain effective social support, and compliance with psychotropic medications if prescribed.;Long Term: Emotional wellbeing is indicated by absence of clinically significant psychosocial distress or social isolation.       Core Components/Risk Factors/Patient Goals Review:  Goals and Risk Factor Review    Row Name 11/15/16 1205 12/20/16 1535 01/15/17 1206         Core Components/Risk Factors/Patient Goals Review   Personal Goals Review  Heart Failure;Stress;Lipids;Weight Management/Obesity;Improve shortness of breath with ADL's  Weight Management/Obesity;Improve shortness of breath with ADL's;Heart Failure;Stress  Weight Management/Obesity;Improve shortness of breath with ADL's;Heart Failure;Stress     Review  BobAmareates the program has helped her  alot since she started. Her dizziness has been better but is still lingers. She is taking her medication prescribed by the doctors with no changes currently.  Cesilia states [facility] has helped her with her breathing, ADL's and her overall health. Her brother inlaw has died recently which was very hard for her.  Achol states that she does not get stressed out. Her ADL's have improved but yesterday she had a rough day. Her weight has been stable and she wants to maintain weight. She has an appointment for blood work to be done this week.  Jules says exercise has helped her alot  with her mood and being able to be social.     Expected Outcomes  Short: minimize stress, maintain weight, SOB with ADL's. Get her lipids checked. Long: Maintain stress by contiuing to exercise after [facility].   Short: continue to attend [facility] to decrease stress. Long: Graduate [facility] and maintain ADL.  Short: improve ADL's by working increasing levels on exercises. Long: maintain independent exercise.        Core Components/Risk Factors/Patient Goals at Discharge (Final Review):  Goals and Risk Factor Review - 01/15/17 1206      Core Components/Risk Factors/Patient Goals Review   Personal Goals Review  Weight Management/Obesity;Improve shortness of breath with ADL's;Heart Failure;Stress    Review  Brinda states that she does not get stressed out. Her ADL's have improved but yesterday she had a rough day. Her weight has been stable and she wants to maintain weight. She has an appointment for blood work to be done this week.  Kynnedi says exercise has helped her alot  with her mood and being able to be social.    Expected Outcomes  Short: improve ADL's by working increasing levels on exercises. Long: maintain independent exercise.       ITP Comments: ITP Comments    Row Name 10/09/16 1555 10/16/16 1209 10/30/16 0844 11/27/16 0842 12/13/16 1042   ITP Comments  Medical evaluation completed. Chart sent to Dr. Charm Barges director of [place] for review and changes.  Patient has slight episode of dizziness and has already talked to her ear doctor. Blood pressure increased after rest and water.  30 day review completed. ITP sent to Dr. Emily Filbert Director of [place]. Continue with ITP unless changes are made by physician.    30 day review completed. ITP sent to Dr. Emily Filbert Director of [place]. Continue with ITP unless changes are made by physician.    Journi called to let us know that her brother in law has passed away and that she will be out with her husband.  Her husband other brother and sister are also in the hospital and not doing well so she may out on Friday as well.  She hopes to return on Monday.   [place] Name 12/25/16 0839 01/22/17 0858 01/29/17 1153       ITP Comments  30 day review completed. ITP sent to Dr. Emily Filbert Director of [place]. Continue with ITP unless changes are made by physician.    30 day review completed. ITP sent to Dr. Emily Filbert Director of [place]. Continue with ITP unless changes are made by physician.    Discharge ITP sent and signed by Dr. Sabra Heck.  Discharge Summary routed to PCP and pulmonologist.        Comments: discharge ITP

## 2017-01-29 NOTE — Progress Notes (Signed)
Daily Session Note  Patient Details  Name: Sierra Kaiser MRN: [id number] Date of Birth: Feb 27, 1934 Referring Provider:     Pulmonary Rehab from 10/09/2016 in [facility]  Referring Provider  [name] MD      Encounter Date: 01/29/2017  Check In: Session Check In - 01/29/17 1144      Check-In   Location  [facility]-Cardiac & Pulmonary Rehab    Staff Present  Justin Mend RCP,RRT,BSRT;Amanda Oletta Darter, BA, ACSM CEP, Exercise Physiologist;Kelly Amedeo Plenty, BS, ACSM CEP, Exercise Physiologist    Supervising physician immediately available to respond to emergencies  [facility] immediately available ER MD    Physician(s)  Dr. Quentin Cornwall and Corky Downs    Medication changes reported      No    Fall or balance concerns reported     No    Warm-up and Cool-down  Performed as group-led instruction    Resistance Training Performed  Yes    VAD Patient?  No      Pain Assessment   Currently in Pain?  No/denies          Social History   Tobacco Use  Smoking Status Never Smoker  Smokeless Tobacco Never Used    Goals Met:  Proper associated with RPD/PD & O2 Sat Independence with exercise equipment Exercise tolerated well No report of cardiac concerns or symptoms Strength training completed today  Goals Unmet:  Not Applicable  Comments:  Sierra Kaiser graduated today from cardiac rehab with 36 sessions completed.  Details of the patient's exercise prescription and what She needs to do in order to continue the prescription and progress were discussed with patient.  Patient was given a copy of prescription and goals.  Patient verbalized understanding.  Sierra Kaiser plans to continue to exercise by joining [employer].   Dr. Emily Filbert is Medical Director for [place] and [facility].

## 2017-01-30 ENCOUNTER — Encounter: Payer: Self-pay | Admitting: Family Medicine

## 2017-01-30 ENCOUNTER — Ambulatory Visit (INDEPENDENT_AMBULATORY_CARE_PROVIDER_SITE_OTHER): Payer: Medicare Other | Admitting: Family Medicine

## 2017-01-30 VITALS — BP 118/70 | HR 78 | Temp 98.0°F | Ht 67.0 in | Wt 147.0 lb

## 2017-01-30 DIAGNOSIS — E785 Hyperlipidemia, unspecified: Secondary | ICD-10-CM

## 2017-01-30 DIAGNOSIS — Z639 Problem related to primary support group, unspecified: Secondary | ICD-10-CM | POA: Diagnosis not present

## 2017-01-30 DIAGNOSIS — R0609 Other forms of dyspnea: Secondary | ICD-10-CM | POA: Diagnosis not present

## 2017-01-30 DIAGNOSIS — K5900 Constipation, unspecified: Secondary | ICD-10-CM

## 2017-01-30 DIAGNOSIS — M8580 Other specified disorders of bone density and structure, unspecified site: Secondary | ICD-10-CM | POA: Diagnosis not present

## 2017-01-30 DIAGNOSIS — R0600 Dyspnea, unspecified: Secondary | ICD-10-CM

## 2017-01-30 DIAGNOSIS — D509 Iron deficiency anemia, unspecified: Secondary | ICD-10-CM

## 2017-01-30 DIAGNOSIS — Z0001 Encounter for general adult medical examination with abnormal findings: Secondary | ICD-10-CM | POA: Insufficient documentation

## 2017-01-30 DIAGNOSIS — I482 Chronic atrial fibrillation: Secondary | ICD-10-CM

## 2017-01-30 DIAGNOSIS — N183 Chronic kidney disease, stage 3 unspecified: Secondary | ICD-10-CM

## 2017-01-30 DIAGNOSIS — I4821 Permanent atrial fibrillation: Secondary | ICD-10-CM

## 2017-01-30 DIAGNOSIS — K5909 Other constipation: Secondary | ICD-10-CM | POA: Insufficient documentation

## 2017-01-30 DIAGNOSIS — I340 Nonrheumatic mitral (valve) insufficiency: Secondary | ICD-10-CM | POA: Diagnosis not present

## 2017-01-30 DIAGNOSIS — Z0000 Encounter for general adult medical examination without abnormal findings: Secondary | ICD-10-CM

## 2017-01-30 NOTE — Assessment & Plan Note (Signed)
Reviewed continued family stress.

## 2017-01-30 NOTE — Assessment & Plan Note (Signed)
Chronic, stable. S/p ablation. On regular xarelto 15mg  daily.

## 2017-01-30 NOTE — Assessment & Plan Note (Addendum)
Reviewed history with patient. Encouraged good hydration status and need to avoid nephrotoxic agents. Recommended minimizing ibuprofen.

## 2017-01-30 NOTE — Patient Instructions (Addendum)
If interested, check with pharmacy about new 2 shot shingles series (shingrix).  Start 1 calcium 649m/vit D 400 unit daily.  Make sure to get regular weight bearing exercise to keep bones strong.  Increase water intake.  Start soluble fiber supplement daily like metamucil or benefiber.  Iron stores were low - start iron rich diet.  Try tylenol in place of ibuprofen.  Return in 6 months for follow up visit with labs.   Health Maintenance, Female Adopting a healthy lifestyle and getting preventive care can go a long way to promote health and wellness. Talk with your health care provider about what schedule of regular examinations is right for you. This is a good chance for you to check in with your provider about disease prevention and staying healthy. In between checkups, there are plenty of things you can do on your own. Experts have done a lot of research about which lifestyle changes and preventive measures are most likely to keep you healthy. Ask your health care provider for more information. Weight and diet Eat a healthy diet  Be sure to include plenty of vegetables, fruits, low-fat dairy products, and lean protein.  Do not eat a lot of foods high in solid fats, added sugars, or salt.  Get regular exercise. This is one of the most important things you can do for your health. ? Most adults should exercise for at least 150 minutes each week. The exercise should increase your heart rate and make you sweat (moderate-intensity exercise). ? Most adults should also do strengthening exercises at least twice a week. This is in addition to the moderate-intensity exercise.  Maintain a healthy weight  Body mass index (BMI) is a measurement that can be used to identify possible weight problems. It estimates body fat based on height and weight. Your health care provider can help determine your BMI and help you achieve or maintain a healthy weight.  For females 237years of age and older: ? A BMI  below 18.5 is considered underweight. ? A BMI of 18.5 to 24.9 is normal. ? A BMI of 25 to 29.9 is considered overweight. ? A BMI of 30 and above is considered obese.  Watch levels of cholesterol and blood lipids  You should start having your blood tested for lipids and cholesterol at 81years of age, then have this test every 5 years.  You may need to have your cholesterol levels checked more often if: ? Your lipid or cholesterol levels are high. ? You are older than 81years of age. ? You are at high risk for heart disease.  Cancer screening Lung Cancer  Lung cancer screening is recommended for adults 562853years old who are at high risk for lung cancer because of a history of smoking.  A yearly low-dose CT scan of the lungs is recommended for people who: ? Currently smoke. ? Have quit within the past 15 years. ? Have at least a 30-pack-year history of smoking. A pack year is smoking an average of one pack of cigarettes a day for 1 year.  Yearly screening should continue until it has been 15 years since you quit.  Yearly screening should stop if you develop a health problem that would prevent you from having lung cancer treatment.  Breast Cancer  Practice breast self-awareness. This means understanding how your breasts normally appear and feel.  It also means doing regular breast self-exams. Let your health care provider know about any changes, no matter how small.  If you are in your 20s or 30s, you should have a clinical breast exam (CBE) by a health care provider every 1-3 years as part of a regular health exam.  If you are 81 or older, have a CBE every year. Also consider having a breast X-ray (mammogram) every year.  If you have a family history of breast cancer, talk to your health care provider about genetic screening.  If you are at high risk for breast cancer, talk to your health care provider about having an MRI and a mammogram every year.  Breast cancer gene  (BRCA) assessment is recommended for women who have family members with BRCA-related cancers. BRCA-related cancers include: ? Breast. ? Ovarian. ? Tubal. ? Peritoneal cancers.  Results of the assessment will determine the need for genetic counseling and BRCA1 and BRCA2 testing.  Cervical Cancer Your health care provider may recommend that you be screened regularly for cancer of the pelvic organs (ovaries, uterus, and vagina). This screening involves a pelvic examination, including checking for microscopic changes to the surface of your cervix (Pap test). You may be encouraged to have this screening done every 3 years, beginning at age 82.  For women ages 103-65, health care providers may recommend pelvic exams and Pap testing every 3 years, or they may recommend the Pap and pelvic exam, combined with testing for human papilloma virus (HPV), every 5 years. Some types of HPV increase your risk of cervical cancer. Testing for HPV may also be done on women of any age with unclear Pap test results.  Other health care providers may not recommend any screening for nonpregnant women who are considered low risk for pelvic cancer and who do not have symptoms. Ask your health care provider if a screening pelvic exam is right for you.  If you have had past treatment for cervical cancer or a condition that could lead to cancer, you need Pap tests and screening for cancer for at least 20 years after your treatment. If Pap tests have been discontinued, your risk factors (such as having a new sexual partner) need to be reassessed to determine if screening should resume. Some women have medical problems that increase the chance of getting cervical cancer. In these cases, your health care provider may recommend more frequent screening and Pap tests.  Colorectal Cancer  This type of cancer can be detected and often prevented.  Routine colorectal cancer screening usually begins at 81 years of age and continues  through 81 years of age.  Your health care provider may recommend screening at an earlier age if you have risk factors for colon cancer.  Your health care provider may also recommend using home test kits to check for hidden blood in the stool.  A small camera at the end of a tube can be used to examine your colon directly (sigmoidoscopy or colonoscopy). This is done to check for the earliest forms of colorectal cancer.  Routine screening usually begins at age 14.  Direct examination of the colon should be repeated every 5-10 years through 81 years of age. However, you may need to be screened more often if early forms of precancerous polyps or small growths are found.  Skin Cancer  Check your skin from head to toe regularly.  Tell your health care provider about any new moles or changes in moles, especially if there is a change in a mole's shape or color.  Also tell your health care provider if you have a mole that is  larger than the size of a pencil eraser.  Always use sunscreen. Apply sunscreen liberally and repeatedly throughout the day.  Protect yourself by wearing long sleeves, pants, a wide-brimmed hat, and sunglasses whenever you are outside.  Heart disease, diabetes, and high blood pressure  High blood pressure causes heart disease and increases the risk of stroke. High blood pressure is more likely to develop in: ? People who have blood pressure in the high end of the normal range (130-139/85-89 mm Hg). ? People who are overweight or obese. ? People who are African American.  If you are 74-54 years of age, have your blood pressure checked every 3-5 years. If you are 26 years of age or older, have your blood pressure checked every year. You should have your blood pressure measured twice-once when you are at a hospital or clinic, and once when you are not at a hospital or clinic. Record the average of the two measurements. To check your blood pressure when you are not at a  hospital or clinic, you can use: ? An automated blood pressure machine at a pharmacy. ? A home blood pressure monitor.  If you are between 37 years and 6 years old, ask your health care provider if you should take aspirin to prevent strokes.  Have regular diabetes screenings. This involves taking a blood sample to check your fasting blood sugar level. ? If you are at a normal weight and have a low risk for diabetes, have this test once every three years after 81 years of age. ? If you are overweight and have a high risk for diabetes, consider being tested at a younger age or more often. Preventing infection Hepatitis B  If you have a higher risk for hepatitis B, you should be screened for this virus. You are considered at high risk for hepatitis B if: ? You were born in a country where hepatitis B is common. Ask your health care provider which countries are considered high risk. ? Your parents were born in a high-risk country, and you have not been immunized against hepatitis B (hepatitis B vaccine). ? You have HIV or AIDS. ? You use needles to inject street drugs. ? You live with someone who has hepatitis B. ? You have had sex with someone who has hepatitis B. ? You get hemodialysis treatment. ? You take certain medicines for conditions, including cancer, organ transplantation, and autoimmune conditions.  Hepatitis C  Blood testing is recommended for: ? Everyone born from [birth year] through 1963. ? Anyone with known risk factors for hepatitis C.  Sexually transmitted infections (STIs)  You should be screened for sexually transmitted infections (STIs) including gonorrhea and chlamydia if: ? You are sexually active and are younger than 81 years of age. ? You are older than 81 years of age and your health care provider tells you that you are at risk for this type of infection. ? Your sexual activity has changed since you were last screened and you are at an increased risk for chlamydia or  gonorrhea. Ask your health care provider if you are at risk.  If you do not have HIV, but are at risk, it may be recommended that you take a prescription medicine daily to prevent HIV infection. This is called pre-exposure prophylaxis (PrEP). You are considered at risk if: ? You are sexually active and do not regularly use condoms or know the HIV status of your partner(s). ? You take drugs by injection. ? You are sexually active  with a partner who has HIV.  Talk with your health care provider about whether you are at high risk of being infected with HIV. If you choose to begin PrEP, you should first be tested for HIV. You should then be tested every 3 months for as long as you are taking PrEP. Pregnancy  If you are premenopausal and you may become pregnant, ask your health care provider about preconception counseling.  If you may become pregnant, take 400 to 800 micrograms (mcg) of folic acid every day.  If you want to prevent pregnancy, talk to your health care provider about birth control (contraception). Osteoporosis and menopause  Osteoporosis is a disease in which the bones lose minerals and strength with aging. This can result in serious bone fractures. Your risk for osteoporosis can be identified using a bone density scan.  If you are 87 years of age or older, or if you are at risk for osteoporosis and fractures, ask your health care provider if you should be screened.  Ask your health care provider whether you should take a calcium or vitamin D supplement to lower your risk for osteoporosis.  Menopause may have certain physical symptoms and risks.  Hormone replacement therapy may reduce some of these symptoms and risks. Talk to your health care provider about whether hormone replacement therapy is right for you. Follow these instructions at home:  Schedule regular health, dental, and eye exams.  Stay current with your immunizations.  Do not use any tobacco products including  cigarettes, chewing tobacco, or electronic cigarettes.  If you are pregnant, do not drink alcohol.  If you are breastfeeding, limit how much and how often you drink alcohol.  Limit alcohol intake to no more than 1 drink per day for nonpregnant women. One drink equals 12 ounces of beer, 5 ounces of wine, or 1 ounces of hard liquor.  Do not use street drugs.  Do not share needles.  Ask your health care provider for help if you need support or information about quitting drugs.  Tell your health care provider if you often feel depressed.  Tell your health care provider if you have ever been abused or do not feel safe at home. This information is not intended to replace advice given to you by your health care provider. Make sure you discuss any questions you have with your health care provider. Document Released: 09/05/2010 Document Revised: 07/29/2015 Document Reviewed: 11/24/2014 Elsevier Interactive Patient Education  [employer].

## 2017-01-30 NOTE — Assessment & Plan Note (Addendum)
Anemia has resolved, but persistent iron deficiency with low ferritin. Discussed this. Did not recommend iron tablets due to already battling constipation. Recommended iron rich diet, consider iron infusion - will recheck labs at 6 mo f/u visit. Consider iFOB.

## 2017-01-30 NOTE — Assessment & Plan Note (Signed)
S/p eval by Dr [name] at [place] not thought to contribute to exertional dyspnea.

## 2017-01-30 NOTE — Assessment & Plan Note (Signed)
Preventative protocols reviewed and updated unless pt declined. Discussed healthy diet and lifestyle.  

## 2017-01-30 NOTE — Assessment & Plan Note (Addendum)
Reviewed recent [place] with patient.  Continue crestor.

## 2017-01-30 NOTE — Assessment & Plan Note (Signed)
Daily stool softener, daily miralax ineffective in the past. Reviewed with patient, encouraged she trial soluble fiber supplement like metamucil or benefiber.

## 2017-01-30 NOTE — Assessment & Plan Note (Signed)
Reviewed importance of calcium and vitamin D intake as well as regular weight bearing exercises.

## 2017-01-30 NOTE — Progress Notes (Signed)
BP 118/70 (BP Location: Left Arm, Patient Position: Sitting, Cuff Size: Normal)   Pulse 78   Temp 98 F (36.7 C) (Oral)   Ht 5\' 7"  (1.702 m)   Wt 147 lb (66.7 kg)   SpO2 97%   BMI 23.02 kg/m    CC: CPE Subjective:    Patient ID: Sierra Kaiser, female    DOB: 01/14/1934, 83 y.o.   MRN: [id number]  HPI: Sierra Kaiser is a 83 y.o. female presenting on 01/30/2017 for Annual Exam (Pt 2)   Significant stressors recently - great grandson recently incarcerated, other grandson going to [place] with 48, brother with cancer, BIL recently died. Children do not get along with each other.   H/o permanent atrial fibrillation s/p AV ablation and complete heart block s/p pacemaker followed by EP [name]). Ongoing dyspnea with exertion that improves with rest. Saw Dr [name] at [facility] - known mitral valve regurg not thought cause of this. She did not tolerate aldactone due to hyperkalemia. Planned repeat echo in 3 months to reassess, consider CRT. Referred to cardiac rehab - this has helped. Last session was yesterday.   Ongoing R arm pain with prolonged use. She also has intermittent R thoracic back pain. She also has cervical neck pain.   Saw Lesia 2 weeks ago for medicare wellness visit. Note reviewed.    Preventative: Colonoscopy 2010 - polyps  Mammogram 12/2015. She does check breast checks at home without concerns.  DEXA 12/2015 - osteopenia. Discussed calcium and vit D dosing as well as regular weight bearing exercise.  Flu shot yearly  pneumovax 2012, prevnar 2016  Tetanus - unsure  zostavax ~2014  shingrix - discussed  Seat belt use discussed Sunscreen use discussed. No changing moles on skin.  Non smoker Alcohol - 1-2 glasses of wine once a week  Lives with husband.  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 Interim medical history since our last visit reviewed. Allergies and medications reviewed and updated. Outpatient Medications Prior to Visit  Medication Sig Dispense Refill  . Biotin (BIOTIN 5000) 5 MG CAPS Take by mouth.    . Calcium Carb-Cholecalciferol (CALCIUM-VITAMIN D) 600-400 MG-UNIT TABS Take 1 tablet by mouth daily.    . diphenhydrAMINE-zinc acetate (BENADRYL) cream Apply 1 application topically 2 (two) times daily as needed for itching.    . fluticasone (FLONASE) 50 MCG/ACT nasal spray     . furosemide (LASIX) 40 MG tablet Take 40 mg by mouth daily.    Marland Kitchen omeprazole (PRILOSEC) 40 MG capsule Take 1 capsule (40 mg total) by mouth daily. 90 capsule 3  . Polyethyl Glycol-Propyl Glycol (SYSTANE OP) Place 1 drop into both eyes daily as needed (dry eyes).    . Rivaroxaban (XARELTO) 15 MG TABS tablet Take 1 tablet (15 mg total) by mouth daily with supper. 90 tablet 3  . rosuvastatin (CRESTOR) 20 MG tablet TAKE 1 TABLET DAILY 90 tablet 2   No facility-administered medications prior to visit.      Per HPI unless specifically indicated in ROS section below Review of Systems  Constitutional: Negative for activity change, appetite change, chills, fatigue, fever and unexpected weight change.  HENT: Negative for hearing loss.   Eyes: Negative for visual disturbance.  Respiratory: Positive for shortness of  breath. Negative for cough, chest tightness and wheezing.   Cardiovascular: Negative for chest pain, palpitations and leg swelling.  Gastrointestinal: Positive for constipation (occasional constipation treats with glycerine suppositories, otc stool softener and miralax haven't helped). Negative for abdominal distention, abdominal pain, blood in stool, diarrhea, nausea and vomiting.  Genitourinary: Negative for difficulty urinating and hematuria.  Musculoskeletal: Negative for arthralgias,  myalgias and neck pain.  Skin: Negative for rash.  Neurological: Positive for dizziness and headaches. Negative for seizures and syncope.  Hematological: Negative for adenopathy. Does not bruise/bleed easily.  Psychiatric/Behavioral: Negative for dysphoric mood. The patient is not nervous/anxious.        Objective:    BP 118/70 (BP Location: Left Arm, Patient Position: Sitting, Cuff Size: Normal)   Pulse 78   Temp 98 F (36.7 C) (Oral)   Ht 5\' 7"  (1.702 m)   Wt 147 lb (66.7 kg)   SpO2 97%   BMI 23.02 kg/m   Wt Readings from Last 3 Encounters:  01/30/17 147 lb (66.7 kg)  01/17/17 145 lb 4 oz (65.9 kg)  11/07/16 149 lb (67.6 kg)    Physical Exam  Constitutional: She is oriented to person, place, and time. She appears well-developed and well-nourished. No distress.  HENT:  Head: Normocephalic and atraumatic.  Right Ear: Hearing, tympanic membrane, external ear and ear canal normal.  Left Ear: Hearing, tympanic membrane, external ear and ear canal normal.  Nose: Nose normal.  Mouth/Throat: Uvula is midline, oropharynx is clear and moist and mucous membranes are normal. No oropharyngeal exudate, posterior oropharyngeal edema or posterior oropharyngeal erythema.  Eyes: Conjunctivae and EOM are normal. Pupils are equal, round, and reactive to light. No scleral icterus.  Neck: Normal range of motion. Neck supple. Carotid bruit is not present. No thyromegaly present.  Cardiovascular: Normal rate, regular rhythm and intact distal pulses.  Murmur (3/6 SEM) heard. Pulses:      Radial pulses are 2+ on the right side, and 2+ on the left side.  Pulmonary/Chest: Effort normal and breath sounds normal. No respiratory distress. She has no wheezes. She has no rales.  Abdominal: Soft. Bowel sounds are normal. She exhibits no distension and no mass. There is no tenderness. There is no rebound and no guarding.  Musculoskeletal: Normal range of motion. She exhibits no edema.  Lymphadenopathy:     She has no cervical adenopathy.  Neurological: She is alert and oriented to person, place, and time.  CN grossly intact, station and gait intact  Skin: Skin is warm and dry. No rash noted.  Psychiatric: She has a normal mood and affect. Her behavior is normal. Judgment and thought content normal.  Nursing note and vitals reviewed.  Results for orders placed or performed in visit on 01/17/17  Ferritin  Result Value Ref Range   Ferritin 5.1 (L) 10.0 - 291.0 ng/mL  TSH  Result Value Ref Range   TSH 2.28 0.35 - 4.50 uIU/mL  Renal function panel  Result Value Ref Range   Sodium 141 135 - 145 mEq/L   Potassium 4.9 3.5 - 5.1 mEq/L   Chloride 106 96 - 112 mEq/L   CO2 27 19 - 32 mEq/L   Calcium 9.9 8.4 - 10.5 mg/dL   Albumin 4.3 3.5 - 5.2 g/dL   BUN 27 (H) 6 - 23 mg/dL   Creatinine, Ser 1.29 (H) 0.40 - 1.20 mg/dL   Glucose, Bld 107 (H) 70 - 99 mg/dL   Phosphorus 3.8 2.3 - 4.6 mg/dL   GFR  42.08 (L) >60.00 mL/min   Lab Results  Component Value Date   CHOL 159 08/15/2016   HDL 57 08/15/2016   LDLCALC 73 08/15/2016   TRIG 143 08/15/2016   CHOLHDL 2.8 08/15/2016    Lab Results  Component Value Date   WBC 7.0 08/15/2016   HGB 12.7 08/15/2016   HCT 39.3 08/15/2016   MCV 85 08/15/2016   PLT 216 08/15/2016       Assessment & Plan:   Problem List Items Addressed This Visit    Atrial fibrillation, permanent (HCC)    Chronic, stable. S/p ablation. On regular xarelto 15mg  daily.       CKD (chronic kidney disease) stage 3, GFR 30-59 ml/min (HCC)    Reviewed history with patient. Encouraged good hydration status and need to avoid nephrotoxic agents. Recommended minimizing ibuprofen.       Constipation    Daily stool softener, daily miralax ineffective in the past. Reviewed with patient, encouraged she trial soluble fiber supplement like metamucil or benefiber.       Exertional dyspnea    Ongoing but possibly improved with cardiac rehab.  Reviewed recent cardiology evaluation  with patient.       Family circumstance    Reviewed continued family stress.       Health maintenance examination - Primary    Preventative protocols reviewed and updated unless pt declined. Discussed healthy diet and lifestyle.       Hyperlipidemia    Reviewed recent FLP with patient.  Continue crestor.       Iron deficiency anemia    Anemia has resolved, but persistent iron deficiency with low ferritin. Discussed this. Did not recommend iron tablets due to already battling constipation. Recommended iron rich diet, consider iron infusion - will recheck labs at 6 mo f/u visit. Consider iFOB.       Mitral regurgitation    S/p eval by Dr [name] at [place] not thought to contribute to exertional dyspnea.      Osteopenia    Reviewed importance of calcium and vitamin D intake as well as regular weight bearing exercises.           Follow up plan: Return in about 6 months (around 07/30/2017) for follow up visit.  [name], MD

## 2017-01-30 NOTE — Assessment & Plan Note (Signed)
Ongoing but possibly improved with cardiac rehab.  Reviewed recent cardiology evaluation with patient.

## 2017-02-06 ENCOUNTER — Encounter: Payer: Medicare Other | Admitting: *Deleted

## 2017-02-06 ENCOUNTER — Telehealth: Payer: Self-pay | Admitting: Cardiology

## 2017-02-06 NOTE — Telephone Encounter (Signed)
Spoke with pt and reminded pt of remote transmission that is due today. Pt verbalized understanding.   

## 2017-02-08 ENCOUNTER — Encounter: Payer: Self-pay | Admitting: Cardiology

## 2017-02-13 ENCOUNTER — Other Ambulatory Visit: Payer: Medicare Other

## 2017-02-14 ENCOUNTER — Ambulatory Visit (INDEPENDENT_AMBULATORY_CARE_PROVIDER_SITE_OTHER): Payer: Medicare Other | Admitting: *Deleted

## 2017-02-14 DIAGNOSIS — I442 Atrioventricular block, complete: Secondary | ICD-10-CM

## 2017-02-15 NOTE — Progress Notes (Signed)
Remote pacemaker transmission.   

## 2017-02-20 ENCOUNTER — Encounter: Payer: Self-pay | Admitting: Cardiology

## 2017-02-22 ENCOUNTER — Other Ambulatory Visit: Payer: Self-pay

## 2017-02-22 MED ORDER — FUROSEMIDE 40 MG PO TABS: 40.0000 mg | ORAL_TABLET | Freq: Every day | ORAL | 0 refills | Status: DC

## 2017-02-22 NOTE — Telephone Encounter (Signed)
Requested Prescriptions   Signed Prescriptions Disp Refills  . furosemide (LASIX) 40 MG tablet 90 tablet 0    Sig: Take 1 tablet (40 mg total) by mouth daily.    Authorizing Provider: [name]    Ordering User: Janan Ridge

## 2017-03-08 LAB — CUP PACEART REMOTE DEVICE CHECK
Battery Remaining Longevity: 139 mo
Battery Remaining Percentage: 95.5 %
Battery Voltage: 2.98 V
Brady Statistic RV Percent Paced: 99 %
Date Time Interrogation Session: 20181212140356
Implantable Lead Implant Date: 20160217
Implantable Lead Location: 753860
Implantable Lead Model: 1948
Implantable Pulse Generator Implant Date: 20160217
Lead Channel Impedance Value: 600 Ohm
Lead Channel Pacing Threshold Amplitude: 0.625 V
Lead Channel Pacing Threshold Pulse Width: 0.4 ms
Lead Channel Sensing Intrinsic Amplitude: 12 mV
Lead Channel Setting Pacing Amplitude: 0.875
Lead Channel Setting Pacing Pulse Width: 0.4 ms
Lead Channel Setting Sensing Sensitivity: 2.5 mV
Pulse Gen Model: 2240
Pulse Gen Serial Number: 3050195

## 2017-03-13 ENCOUNTER — Ambulatory Visit (INDEPENDENT_AMBULATORY_CARE_PROVIDER_SITE_OTHER): Payer: Medicare Other

## 2017-03-13 ENCOUNTER — Other Ambulatory Visit: Payer: Self-pay

## 2017-03-13 DIAGNOSIS — Z950 Presence of cardiac pacemaker: Secondary | ICD-10-CM | POA: Diagnosis not present

## 2017-03-13 DIAGNOSIS — I5030 Unspecified diastolic (congestive) heart failure: Secondary | ICD-10-CM | POA: Diagnosis not present

## 2017-03-13 DIAGNOSIS — I4821 Permanent atrial fibrillation: Secondary | ICD-10-CM

## 2017-03-13 DIAGNOSIS — I482 Chronic atrial fibrillation: Secondary | ICD-10-CM | POA: Diagnosis not present

## 2017-03-19 ENCOUNTER — Telehealth: Payer: Self-pay | Admitting: Internal Medicine

## 2017-03-19 NOTE — Telephone Encounter (Signed)
Pt is returning the call from her echo results.

## 2017-03-19 NOTE — Telephone Encounter (Signed)
Reviewed with patient. See echo results note

## 2017-04-17 ENCOUNTER — Other Ambulatory Visit: Payer: Self-pay | Admitting: Cardiovascular Disease

## 2017-04-17 NOTE — Telephone Encounter (Signed)
Please review for refill, Thanks !  

## 2017-05-05 ENCOUNTER — Other Ambulatory Visit: Payer: Self-pay | Admitting: Internal Medicine

## 2017-05-08 ENCOUNTER — Ambulatory Visit (INDEPENDENT_AMBULATORY_CARE_PROVIDER_SITE_OTHER): Payer: Medicare Other | Admitting: Internal Medicine

## 2017-05-08 ENCOUNTER — Encounter: Payer: Self-pay | Admitting: Internal Medicine

## 2017-05-08 VITALS — BP 110/58 | HR 72 | Ht 68.0 in | Wt 142.0 lb

## 2017-05-08 DIAGNOSIS — I442 Atrioventricular block, complete: Secondary | ICD-10-CM | POA: Diagnosis not present

## 2017-05-08 DIAGNOSIS — I482 Chronic atrial fibrillation: Secondary | ICD-10-CM

## 2017-05-08 DIAGNOSIS — Z9889 Other specified postprocedural states: Secondary | ICD-10-CM | POA: Diagnosis not present

## 2017-05-08 DIAGNOSIS — I4821 Permanent atrial fibrillation: Secondary | ICD-10-CM

## 2017-05-08 DIAGNOSIS — Z950 Presence of cardiac pacemaker: Secondary | ICD-10-CM

## 2017-05-08 DIAGNOSIS — Z8679 Personal history of other diseases of the circulatory system: Secondary | ICD-10-CM | POA: Diagnosis not present

## 2017-05-08 NOTE — Patient Instructions (Signed)
Medication Instructions: - Your physician recommends that you continue on your current medications as directed. Please refer to the Current Medication list given to you today.  Labwork: - none ordered  Procedures/Testing: - none ordered  Follow-Up: - Remote monitoring is used to monitor your Pacemaker of ICD from home. This monitoring reduces the number of office visits required to check your device to one time per year. It allows [place] to keep an eye on the functioning of your device to ensure it is working properly. You are scheduled for a device check from home on 05/16/17. You may send your transmission at any time that day. If you have a wireless device, the transmission will be sent automatically. After your physician reviews your transmission, you will receive a postcard with your next transmission date.  - Your physician wants you to follow-up in: 1 year with Dr. [name]. You will receive a reminder letter in the mail two months in advance. If you don't receive a letter, please call our office to schedule the follow-up appointment.   Any Additional Special Instructions Will Be Listed Below (If Applicable).     If you need a refill on your cardiac medications before your next appointment, please call your pharmacy.

## 2017-05-08 NOTE — Progress Notes (Signed)
Patient Care Team: [name], MD as PCP - General (Family Medicine) Rockey Situ Kathlene November, MD as Consulting Physician (Cardiology)   HPI  Sierra Kaiser is a 84 y.o. female Seen in follow-up for AV ablation for permanent atrial fibrillation with poorly controlled ventricular response. Initially she was very short of breath and the device was reprogrammed  There had been gradual improvement She had undergone previous atrial fibrillation ablation at [facility].  Echo 3/16 demonstrated normal LV function  with moderate MR. Echocardiogram 8/15 described Mild-moderate MRShe had TEE at [facility] from 10/14. This report was read>> no description of eccentric MR was noted.  DATE TEST EF   3/16 Echo   55-65% MR mod  4/17 Echo   55-65 % MR mild-mod  6/18 Echo  EF 45-50% MR mild-mod  1/19 Echo  EF 45-50% MR mild-mod   We again reviewed the report from [place].  With her LV function stable, we will hold off on CRT upgrade.  She continues to struggle with shortness of breath; it may have improved some with cardiopulmonary rehab.  She had an intercurrent viral illness in January with symptoms worsening.  She is also describing pain in her right arm not related to exertion but related to motion.  She sometimes describes it as heavy and weak.    Past Medical History:  Diagnosis Date  . Anxiety   . CAD (coronary artery disease)   . CKD stage 3 secondary to diabetes ([place])   . Esophageal reflux   . GI bleeding 2012   during EGD necessitating open surgery  . Hiatal hernia   . History of blood transfusion   . History of diverticulitis   . History of UTI   . HTN (hypertension)   . Hyperlipidemia   . Meniere's disease   . Mitral regurgitation   . Osteopenia 12/11/2015   T score -1.2 femur, -2.0 spine (12/2015)  . Permanent atrial fibrillation ([place])    a. permanent b. s/p PVI RFA at [facility] 10/14 c. failed amio (neuro toxicity) and Tikosyn d. single chamber STJ PPM implanted 04/2014 in anticipation  of AVN ablation   . PUD (peptic ulcer disease)   . Raynaud's disease   . SNHL (sensorineural hearing loss)    right ear  . Syncopal episodes   . Tinnitus   . Ulcerative colitis ([place])    per prior records  . Urinary incontinence, urge     Past Surgical History:  Procedure Laterality Date  . APPENDECTOMY  1973  . AV NODE ABLATION N/A 05/06/2014   Procedure: AV NODE ABLATION;  Surgeon: [name], MD;  Location: [facility] CATH LAB;  Service: Cardiovascular;  Laterality: N/A;  . CARDIAC CATHETERIZATION  2014   [facility]  . CARDIAC ELECTROPHYSIOLOGY STUDY AND ABLATION    . CHOLECYSTECTOMY  2010  . COLONOSCOPY  2010   polyps  . HEMORRHOID SURGERY    . LAPAROTOMY  2012   EGD biopsy led to bleeding - needed laparotomy to stop bleed ([facility] Jimmye Norman [facility])  . PERMANENT PACEMAKER INSERTION N/A 04/22/2014   STJ single chamber pacemaker implanted by Dr [name]  . TRANSESOPHAGEAL ECHOCARDIOGRAM WITH CARDIOVERSION  2014   [facility]  . UPPER GI ENDOSCOPY  2012   with polypectomy  . VAGINAL HYSTERECTOMY  1973   elective; ovaries remained    Current Outpatient Medications  Medication Sig Dispense Refill  . Biotin (BIOTIN 5000) 5 MG CAPS Take by mouth.    Marland Kitchen  Calcium Carb-Cholecalciferol (CALCIUM-VITAMIN D) 600-400 MG-UNIT TABS Take 1 tablet by mouth daily.    . diphenhydrAMINE-zinc acetate (BENADRYL) cream Apply 1 application topically 2 (two) times daily as needed for itching.    . fluticasone (FLONASE) 50 MCG/ACT nasal spray Place 1 spray into both nostrils daily.     . furosemide (LASIX) 40 MG tablet TAKE 1 TABLET DAILY 90 tablet 0  . omeprazole (PRILOSEC) 40 MG capsule Take 1 capsule (40 mg total) by mouth daily. 90 capsule 3  . Polyethyl Glycol-Propyl Glycol (SYSTANE OP) Place 1 drop into both eyes daily as needed (dry eyes).    . rosuvastatin (CRESTOR) 20 MG tablet TAKE 1 TABLET DAILY 90 tablet 2  . XARELTO 15 MG TABS tablet TAKE 1 TABLET DAILY WITH SUPPER 90 tablet 3   No current  facility-administered medications for this visit.     Allergies  Allergen Reactions  . Gadolinium Derivatives Itching and Rash    Persisted for months  . Contrast Media [Iodinated Diagnostic Agents] Hives       . Metronidazole Itching and Other (See Comments)    Hands really red  . Diltiazem Hcl Itching and Rash    Review of Systems negative except from HPI and PMH  Physical Exam BP (!) 110/58 (BP Location: Left Arm, Patient Position: Sitting, Cuff Size: Normal)   Pulse 72   Ht 5\' 8"  (1.727 m)   Wt 142 lb (64.4 kg)   BMI 21.59 kg/m  Well developed and nourished in no acute distress HENT normal Neck supple with JVP-7 cm Clear Regular rate and rhythm, 2/6 murmur Abd-soft with active BS No Clubbing cyanosis edema Skin-warm and dry A & Oriented  Grossly normal sensory and motor function Upper extremity strength was equal bilaterally and reflexes were intact   ECG ventricular pacing at 72 with underlying atrial fibrillation -17/45 Assessment and  Plan  Atrial fibrillation  Dyspnea on exertion  Complete heart block status post AV ablation  HFpEF   Normal>> 45-50%  LV function   MR mod  Anticoagulation    On Anticoagulation;  No bleeding issues   Dyspnea got better a little bit with exercise; her intercurrent illness has been associated with its worsening.  She is back in exercise.  LVEF was stable 1/19.  We will continue to observe but hold off on CRT upgrade  Not sure of the mechanism of her arm pain.  No pain with motion; neurological exam (rudimentary) was normal  Euvolemic continue current meds but we will undertake a trial for 5 days of taking her diuretics daily to see if it improves dyspnea        .

## 2017-05-09 ENCOUNTER — Other Ambulatory Visit: Payer: Self-pay | Admitting: Family Medicine

## 2017-05-15 LAB — CUP PACEART INCLINIC DEVICE CHECK
Date Time Interrogation Session: 20190312132901
Implantable Lead Implant Date: 20160217
Implantable Lead Location: 753860
Implantable Lead Model: 1948
Implantable Pulse Generator Implant Date: 20160217
Pulse Gen Model: 2240
Pulse Gen Serial Number: 3050195

## 2017-05-16 ENCOUNTER — Ambulatory Visit (INDEPENDENT_AMBULATORY_CARE_PROVIDER_SITE_OTHER): Payer: Medicare Other | Admitting: *Deleted

## 2017-05-16 DIAGNOSIS — I442 Atrioventricular block, complete: Secondary | ICD-10-CM | POA: Diagnosis not present

## 2017-05-16 NOTE — Progress Notes (Signed)
Remote pacemaker transmission.   

## 2017-05-17 ENCOUNTER — Encounter: Payer: Self-pay | Admitting: Cardiology

## 2017-06-03 LAB — CUP PACEART REMOTE DEVICE CHECK
Battery Remaining Longevity: 139 mo
Battery Remaining Percentage: 95.5 %
Battery Voltage: 2.99 V
Brady Statistic RV Percent Paced: 97 %
Date Time Interrogation Session: 20190313060013
Implantable Lead Implant Date: 20160217
Implantable Lead Location: 753860
Implantable Lead Model: 1948
Implantable Pulse Generator Implant Date: 20160217
Lead Channel Impedance Value: 580 Ohm
Lead Channel Pacing Threshold Amplitude: 0.5 V
Lead Channel Pacing Threshold Pulse Width: 0.4 ms
Lead Channel Sensing Intrinsic Amplitude: 12 mV
Lead Channel Setting Pacing Amplitude: 0.75 V
Lead Channel Setting Pacing Pulse Width: 0.4 ms
Lead Channel Setting Sensing Sensitivity: 2.5 mV
Pulse Gen Model: 2240
Pulse Gen Serial Number: 3050195

## 2017-06-04 ENCOUNTER — Other Ambulatory Visit: Payer: Self-pay | Admitting: Family Medicine

## 2017-06-07 ENCOUNTER — Ambulatory Visit: Payer: Self-pay | Admitting: *Deleted

## 2017-06-07 NOTE — Telephone Encounter (Signed)
Pt reports mild-moderate intermittent lightheadedness x 1 week. States has had infrequent episodes of similar symptoms for months. Reports B/P has been running low...118/55 presently, HR 72, has pacer. Pt states has been to pulmonologist and cardiologist for symptoms, states "did not find cause" and suggested seeing PCP. States dizziness is positional at times, > sitting to standing. Pt reports she is on Lasix 40mg  QD but has been taking only 1/2 tab "Because I felt better on 20mg ." States is staying hydrated, denies congestion, fever, CP. Reports SOB but states "chronic x 10 years." "Little worse" which is why she saw pulmonologist. Appt made for tomorrow with Dr. Danise Mina; secured 30 minute appt given given pt's history; Rena aware. Care advise fiven per protocol. Instructed to go to ED of symptoms worsen. Reason for Disposition . [1] MODERATE dizziness (e.g., interferes with normal activities) AND [2] has NOT been evaluated by physician for this  (Exception: dizziness caused by heat exposure, sudden standing, or poor fluid intake)  Answer Assessment - Initial Assessment Questions 1. DESCRIPTION: "Describe your dizziness."     Intermittent, lightheadedness, positional at times 2. LIGHTHEADED: "Do you feel lightheaded?" (e.g., somewhat faint, woozy, weak upon standing)     Yes, briefly 3. VERTIGO: "Do you feel like either you or the room is spinning or tilting?" (i.e. vertigo)     No 4. SEVERITY: "How bad is it?"  "Do you feel like you are going to faint?" "Can you stand and walk?"   - MILD - walking normally   - MODERATE - interferes with normal activities (e.g., work, school)    - SEVERE - unable to stand, requires support to walk, feels like passing out now.      Mild, positional; moderate at times 5. ONSET:  "When did the dizziness begin?"     One week ago, some episodes several months ago 6. AGGRAVATING FACTORS: "Does anything make it worse?" (e.g., standing, change in head position)  Sitting to standing 7. HEART RATE: "Can you tell me your heart rate?" "How many beats in 15 seconds?"  (Note: not all patients can do this)       72 8. CAUSE: "What do you think is causing the dizziness?"     Not sure, B/P low lately 9. RECURRENT SYMPTOM: "Have you had dizziness before?" If so, ask: "When was the last time?" "What happened that time?"     Yes, "different things." 10. OTHER SYMPTOMS: "Do you have any other symptoms?" (e.g., fever, chest pain, vomiting, diarrhea, bleeding)      no  Protocols used: DIZZINESS [facility]

## 2017-06-07 NOTE — Telephone Encounter (Signed)
Pt has 30' appt on 06/08/17 at 11:30.

## 2017-06-08 ENCOUNTER — Ambulatory Visit: Payer: Medicare Other | Admitting: Family Medicine

## 2017-06-13 ENCOUNTER — Ambulatory Visit (INDEPENDENT_AMBULATORY_CARE_PROVIDER_SITE_OTHER): Payer: Medicare Other | Admitting: Family Medicine

## 2017-06-13 ENCOUNTER — Encounter: Payer: Self-pay | Admitting: Family Medicine

## 2017-06-13 VITALS — BP 132/66 | HR 68 | Temp 97.9°F | Ht 68.0 in | Wt 143.5 lb

## 2017-06-13 DIAGNOSIS — R0609 Other forms of dyspnea: Secondary | ICD-10-CM

## 2017-06-13 DIAGNOSIS — I4821 Permanent atrial fibrillation: Secondary | ICD-10-CM

## 2017-06-13 DIAGNOSIS — R5382 Chronic fatigue, unspecified: Secondary | ICD-10-CM | POA: Diagnosis not present

## 2017-06-13 DIAGNOSIS — I482 Chronic atrial fibrillation: Secondary | ICD-10-CM

## 2017-06-13 DIAGNOSIS — D500 Iron deficiency anemia secondary to blood loss (chronic): Secondary | ICD-10-CM

## 2017-06-13 DIAGNOSIS — R0600 Dyspnea, unspecified: Secondary | ICD-10-CM

## 2017-06-13 NOTE — Patient Instructions (Addendum)
Labs today. We will be in touch with results.  Continue b12 tablet and iron tablet daily.  Good to see you today, call us with questions.  Work on good hydration status to help blood pressures.

## 2017-06-13 NOTE — Assessment & Plan Note (Signed)
Check for reversible causes today.

## 2017-06-13 NOTE — Assessment & Plan Note (Signed)
Chronic, ongoing and worse the last few months, but pt states symptoms are improving since she started oral b12 and iron tablets daily this past week. Check B12 and iron levels today.

## 2017-06-13 NOTE — Progress Notes (Signed)
BP 132/66 (BP Location: Left Arm, Patient Position: Sitting, Cuff Size: Normal)    Pulse 68    Temp 97.9 F (36.6 C) (Oral)    Ht 5\' 8"  (1.727 m)    Wt 143 lb 8 oz (65.1 kg)    SpO2 97%    BMI 21.82 kg/m    CC: low BP readings, ongoing dyspnea and fatigue Subjective:    Patient ID: Sierra Kaiser, female    DOB: [birth year], 84 y.o.   MRN: [id number]  HPI: Sierra Kaiser is a 84 y.o. female presenting on 06/13/2017 for Decreased blood pressure (Concerned about low BP readings at home, SOB and unsteady on her feet. Sxs started 01/2017. Feels better today. Pt provided recent home BP readings. Wants to have iron and vit B12 checked.)   Over last 3 months, progressively worsening fatigue, weakness, dyspnea (chronic). Some numbness/paresthesias of hands. 1 wk ago she restarted iron and B12 - with some improvement in symptoms. Some low blood pressure readings. Feels memory is affected. Some constipation on oral iron - managing this well. Endorses orthostatic dizziness. Only takes lasix 20mg  daily. Dyspnea present with minimal exertion, and does improve with rest.   Patient endorses h/o raynaud's and meniere's.  Saw pulm Dr Raul Del - lungs clear.  Saw EP Dr [name] - good report, EF 45-50%, known mod MR.  Seeing ENT Dr Pryor Ochoa - complete hearing loss of R ear over last 2 months.   H/o permanent atrial fibrillation s/p AV ablation and complete heart block s/p pacemaker followed by EP [name]). Ongoing dyspnea with exertion that improves with rest. Saw Dr [name] at [facility] - known mitral valve regurg not thought cause of this. Cardiac rehab was helping this.   Brings log of blood pressures showing readings over the past month, from 80s-130s/40-80s.   Relevant past medical, surgical, family and social history reviewed and updated as indicated. Interim medical history since our last visit reviewed. Allergies and medications reviewed and updated. Outpatient Medications Prior to Visit  Medication Sig Dispense  Refill   Calcium Carb-Cholecalciferol (CALCIUM-VITAMIN D) 600-400 MG-UNIT TABS Take 1 tablet by mouth daily.     diphenhydrAMINE-zinc acetate (BENADRYL) cream Apply 1 application topically 2 (two) times daily as needed for itching.     Ferrous Sulfate (IRON) 325 (65 Fe) MG TABS Take 1 tablet by mouth daily.     FIBER PO Take 1 tablet by mouth daily. 325 mg     fluticasone (FLONASE) 50 MCG/ACT nasal spray Place 1 spray into both nostrils daily.      fluticasone (FLONASE) 50 MCG/ACT nasal spray SPRAY 2 SPRAYS INTO EACH NOSTRIL EVERY DAY 16 g 3   furosemide (LASIX) 40 MG tablet TAKE 1 TABLET DAILY (Patient taking differently: TAKES 1/2 TABLET DAILY) 90 tablet 0   omeprazole (PRILOSEC) 40 MG capsule TAKE 1 CAPSULE DAILY 90 capsule 2   Polyethyl Glycol-Propyl Glycol (SYSTANE OP) Place 1 drop into both eyes daily as needed (dry eyes).     rosuvastatin (CRESTOR) 20 MG tablet TAKE 1 TABLET DAILY 90 tablet 2   vitamin B-12 (CYANOCOBALAMIN) 1000 MCG tablet Take 1,000 mcg by mouth daily.     XARELTO 15 MG TABS tablet TAKE 1 TABLET DAILY WITH SUPPER 90 tablet 3   Biotin (BIOTIN 5000) 5 MG CAPS Take by mouth.     No facility-administered medications prior to visit.      Per HPI unless specifically indicated in ROS section below Review of Systems  Objective:    BP 132/66 (BP Location: Left Arm, Patient Position: Sitting, Cuff Size: Normal)    Pulse 68    Temp 97.9 F (36.6 C) (Oral)    Ht 5\' 8"  (1.727 m)    Wt 143 lb 8 oz (65.1 kg)    SpO2 97%    BMI 21.82 kg/m   Wt Readings from Last 3 Encounters:  06/13/17 143 lb 8 oz (65.1 kg)  05/08/17 142 lb (64.4 kg)  01/30/17 147 lb (66.7 kg)  Physical Exam  Nursing note and vitals reviewed. Constitutional: She appears well-developed and well-nourished. No distress.  HENT:  Mouth/Throat: Oropharynx is clear and moist. No oropharyngeal exudate.  Eyes: Pupils are equal, round, and reactive to light. Conjunctivae are normal.    Cardiovascular: Normal rate, regular rhythm, normal heart sounds and intact distal pulses.  No murmur heard. Respiratory: Effort normal and breath sounds normal. No respiratory distress. She has no wheezes. She has no rales.  Musculoskeletal: She exhibits no edema.  Skin: Skin is warm and dry. No rash noted.  Psychiatric: She has a normal mood and affect.     Results for orders placed or performed in visit on 05/16/17  CUP PACEART REMOTE DEVICE CHECK  Result Value Ref Range   Date Time Interrogation Session [id number]    Pulse Generator Manufacturer [employer]    Pulse Gen Model 2240 Assurity    Pulse Gen Serial Number [id number]    Clinic Name [facility]    Implantable Pulse Generator Type Implantable Pulse Generator    Implantable Pulse Generator Implant Date 60109323    Implantable Lead Manufacturer [employer] IsoFlex Optim    Implantable Lead Serial Number [id number]    Implantable Lead Implant Date 55732202    Implantable Lead Location Detail 1 UNKNOWN    Implantable Lead Location [id number]    Lead Channel Setting Sensing Sensitivity 2.5 mV   Lead Channel Setting Sensing Adaptation Mode Fixed Pacing    Lead Channel Setting Pacing Pulse Width 0.4 ms   Lead Channel Setting Pacing Amplitude 0.75 V   Lead Channel Status     Lead Channel Impedance Value 580 ohm   Lead Channel Sensing Intrinsic Amplitude 12.0 mV   Lead Channel Pacing Threshold Amplitude 0.5 V   Lead Channel Pacing Threshold Pulse Width 0.4 ms   Battery Status MOS    Battery Remaining Longevity 139 mo   Battery Remaining Percentage 95.5 %   Battery Voltage 2.99 V   Brady Statistic RV Percent Paced 97.0 %   Eval Rhythm Vp/Vs       Assessment & Plan:   Problem List Items Addressed This Visit    Atrial fibrillation, permanent (HCC)    Chronic, stable s/p ablation and continues xarelto.       Chronic fatigue    Check for reversible causes today.       Relevant Orders   CBC  with Differential/Platelet   Ferritin   IBC panel   Vitamin B12   TSH   Comprehensive metabolic panel   Exertional dyspnea - Primary    Chronic, ongoing and worse the last few months, but pt states symptoms are improving since she started oral b12 and iron tablets daily this past week. Check B12 and iron levels today.       Relevant Orders   CBC with Differential/Platelet   TSH   Iron deficiency anemia    Update labs. Discussed possible iron infusion for persistent iron  deficiency. She has restarted oral iron tablet daily. Already struggles some with constipation.       Relevant Medications   vitamin B-12 (CYANOCOBALAMIN) 1000 MCG tablet   Ferrous Sulfate (IRON) 325 (65 Fe) MG TABS   Other Relevant Orders   CBC with Differential/Platelet   Ferritin   IBC panel       No orders of the defined types were placed in this encounter.  Orders Placed This Encounter  Procedures   CBC with Differential/Platelet   Ferritin   IBC panel   Vitamin B12   TSH   Comprehensive metabolic panel    Follow up plan: Return if symptoms worsen or fail to improve.  [name], MD

## 2017-06-13 NOTE — Assessment & Plan Note (Signed)
Chronic, stable s/p ablation and continues xarelto.

## 2017-06-13 NOTE — Assessment & Plan Note (Addendum)
Update labs. Discussed possible iron infusion for persistent iron deficiency. She has restarted oral iron tablet daily. Already struggles some with constipation.

## 2017-06-14 LAB — CBC WITH DIFFERENTIAL/PLATELET
Basophils Absolute: 0.1 10*3/uL (ref 0.0–0.1)
Basophils Relative: 0.9 % (ref 0.0–3.0)
Eosinophils Absolute: 0.2 10*3/uL (ref 0.0–0.7)
Eosinophils Relative: 2.6 % (ref 0.0–5.0)
HCT: 34.2 % — ABNORMAL LOW (ref 36.0–46.0)
Hemoglobin: 11 g/dL — ABNORMAL LOW (ref 12.0–15.0)
Lymphocytes Relative: 24.6 % (ref 12.0–46.0)
Lymphs Abs: 1.7 10*3/uL (ref 0.7–4.0)
MCHC: 32.2 g/dL (ref 30.0–36.0)
MCV: 80.8 fl (ref 78.0–100.0)
Monocytes Absolute: 0.6 10*3/uL (ref 0.1–1.0)
Monocytes Relative: 9.5 % (ref 3.0–12.0)
Neutro Abs: 4.3 10*3/uL (ref 1.4–7.7)
Neutrophils Relative %: 62.4 % (ref 43.0–77.0)
Platelets: 231 10*3/uL (ref 150.0–400.0)
RBC: 4.24 Mil/uL (ref 3.87–5.11)
RDW: 17.2 % — ABNORMAL HIGH (ref 11.5–15.5)
WBC: 6.8 10*3/uL (ref 4.0–10.5)

## 2017-06-14 LAB — COMPREHENSIVE METABOLIC PANEL
ALT: 8 U/L (ref 0–35)
AST: 11 U/L (ref 0–37)
Albumin: 4.5 g/dL (ref 3.5–5.2)
Alkaline Phosphatase: 56 U/L (ref 39–117)
BUN: 26 mg/dL — ABNORMAL HIGH (ref 6–23)
CO2: 25 mEq/L (ref 19–32)
Calcium: 10.1 mg/dL (ref 8.4–10.5)
Chloride: 108 mEq/L (ref 96–112)
Creatinine, Ser: 1.47 mg/dL — ABNORMAL HIGH (ref 0.40–1.20)
GFR: 36.16 mL/min — ABNORMAL LOW (ref >60.00)
Glucose, Bld: 84 mg/dL (ref 70–99)
Potassium: 5.1 mEq/L (ref 3.5–5.1)
Sodium: 141 mEq/L (ref 135–145)
Total Bilirubin: 0.4 mg/dL (ref 0.2–1.2)
Total Protein: 7.3 g/dL (ref 6.0–8.3)

## 2017-06-14 LAB — VITAMIN B12: Vitamin B-12: 363 pg/mL (ref 211–911)

## 2017-06-14 LAB — IBC PANEL
Iron: 20 ug/dL — ABNORMAL LOW (ref 42–145)
Saturation Ratios: 4.3 % — ABNORMAL LOW (ref 20.0–50.0)
Transferrin: 329 mg/dL (ref 212.0–360.0)

## 2017-06-14 LAB — FERRITIN: Ferritin: 7.3 ng/mL — ABNORMAL LOW (ref 10.0–291.0)

## 2017-06-14 LAB — TSH: TSH: 3.11 u[IU]/mL (ref 0.35–4.50)

## 2017-06-16 ENCOUNTER — Telehealth: Payer: Self-pay | Admitting: Family Medicine

## 2017-06-16 DIAGNOSIS — D500 Iron deficiency anemia secondary to blood loss (chronic): Secondary | ICD-10-CM

## 2017-06-16 NOTE — Telephone Encounter (Signed)
See lab result note - I'd also like her to complete a stool kit to further evaluate why she is developing iron deficiency anemia. Ordered

## 2017-06-18 NOTE — Telephone Encounter (Signed)
Spoke with pt relaying Dr. Synthia Innocent message.  Says she will pick it up.

## 2017-06-21 NOTE — Telephone Encounter (Signed)
Patient said that someone from the office called her and told her to come by the office and pick up a stool kit. She said that she is not feeling well & doesn't think she can drive to the office. She would like to know if this can mailed to her & she would pay the shipping? Please advise. She said that she would surely be better by next week. Call back is [phone]

## 2017-06-25 NOTE — Telephone Encounter (Signed)
Spoke with pt informing her an iFOB kit with instructions is being mailed to her.  And she can mail it back if she needs to.  Pt verbalizes understanding.

## 2017-07-07 IMAGING — CT CT ABD-PELV W/O CM
2 of 4 series · 16 of 46 positions shown, 18 images · non-contrast
Comparison: None.

CLINICAL DATA: Left upper quadrant/left flank abdominal pain,
history of diverticulitis and colitis, prior cholecystectomy,
appendectomy, and hysterectomy

EXAM:
CT ABDOMEN AND PELVIS WITHOUT CONTRAST
TECHNIQUE: Multidetector CT imaging of the abdomen and pelvis was performed
following the standard protocol without IV contrast.

[Series 2: axial st · axial · 0.67mm/px · z∈[-955,-570]mm · 13 of 85 slices shown, 15 images]
[im 4/85  soft-tissue]
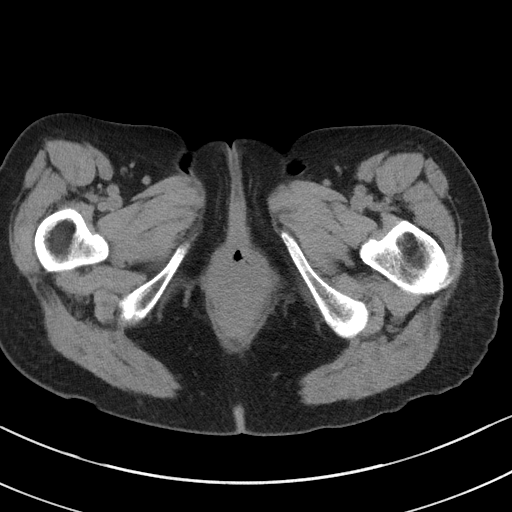
[im 4/85  bone]
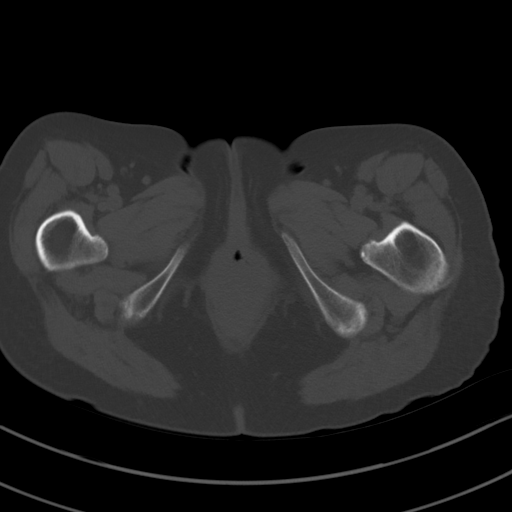
[im 11/85  soft-tissue]
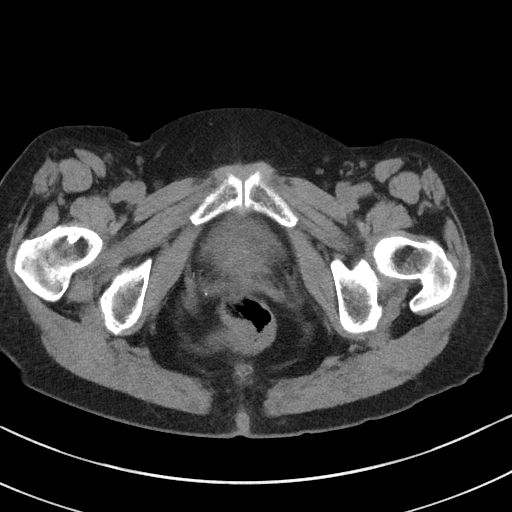
[im 17/85  soft-tissue]
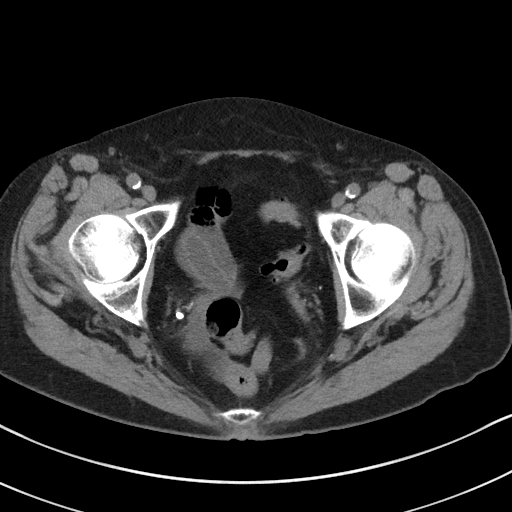
[im 24/85  soft-tissue]
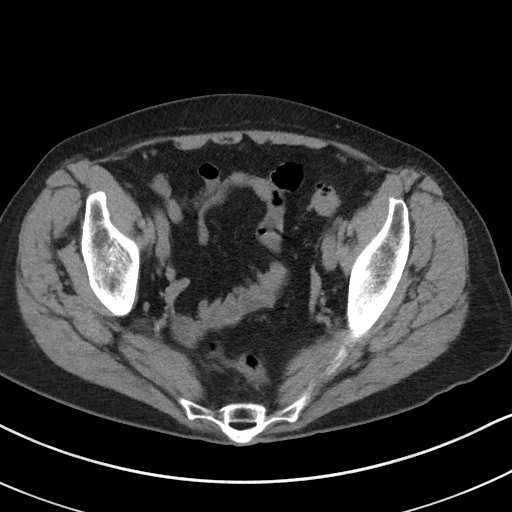
[im 31/85  soft-tissue]
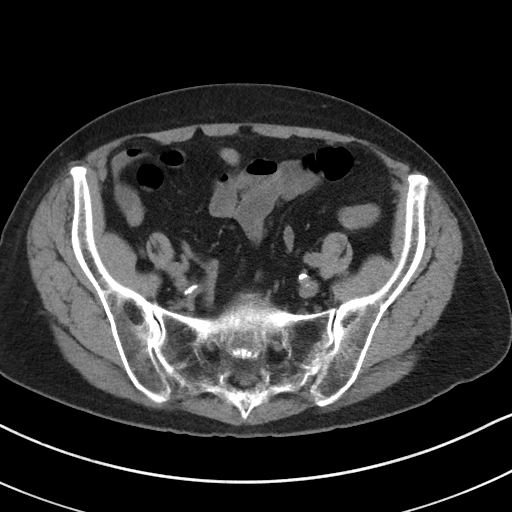
[im 37/85  soft-tissue]
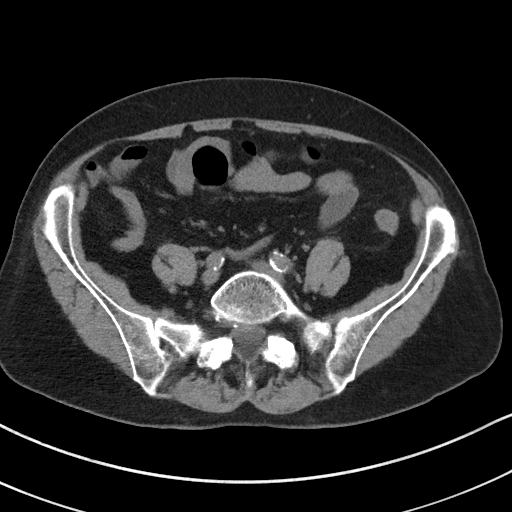
[im 44/85  soft-tissue]
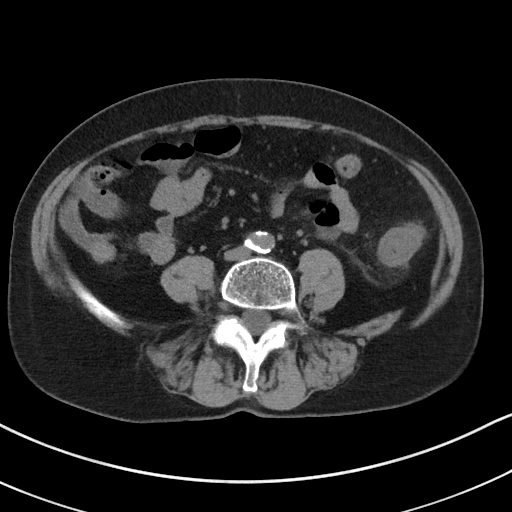
[im 48/85  soft-tissue]
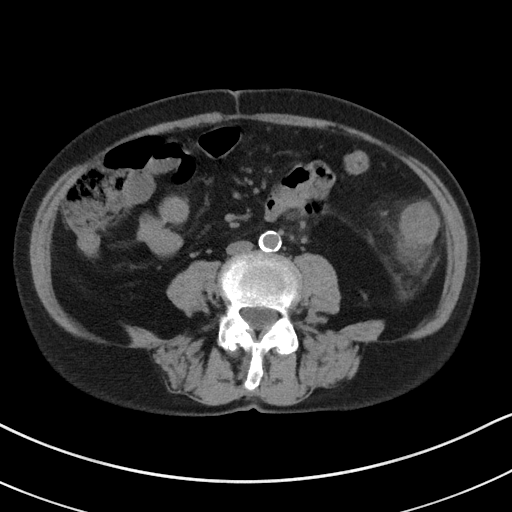
[im 54/85  soft-tissue]
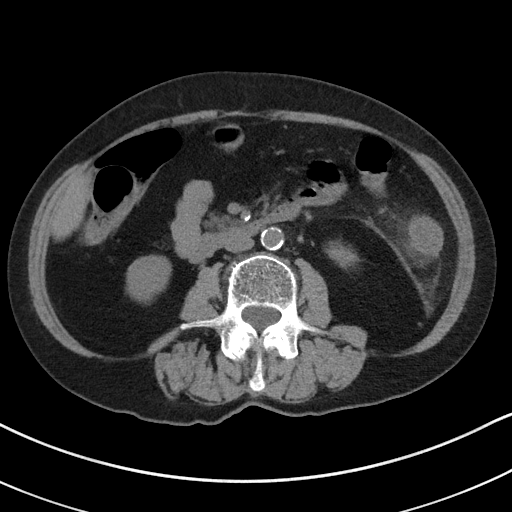
[im 54/85  bone]
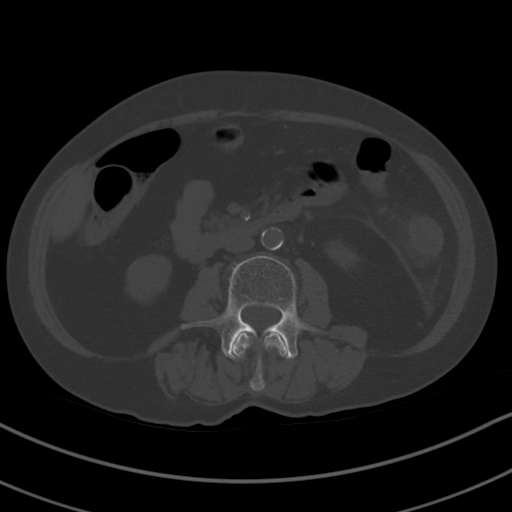
[im 61/85  soft-tissue]
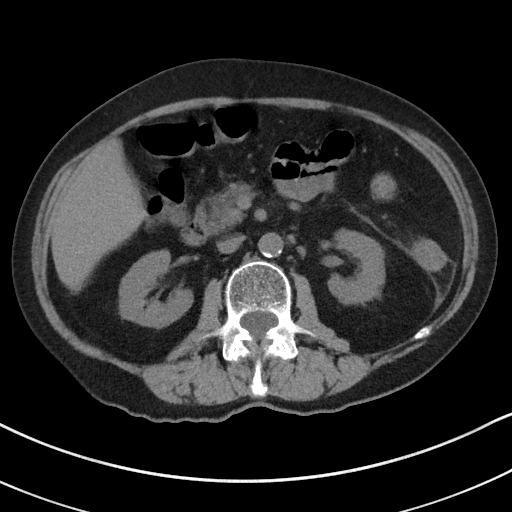
[im 68/85  soft-tissue]
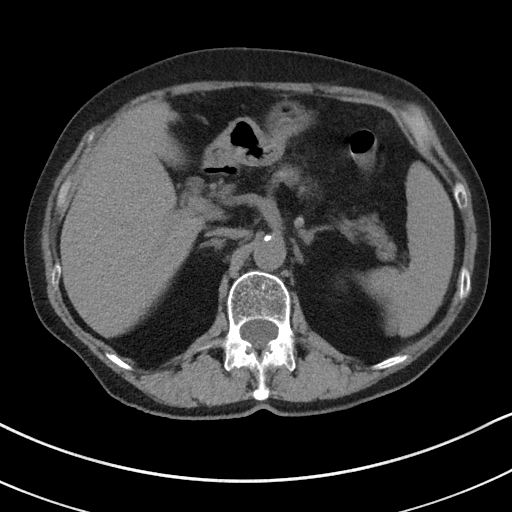
[im 74/85  soft-tissue]
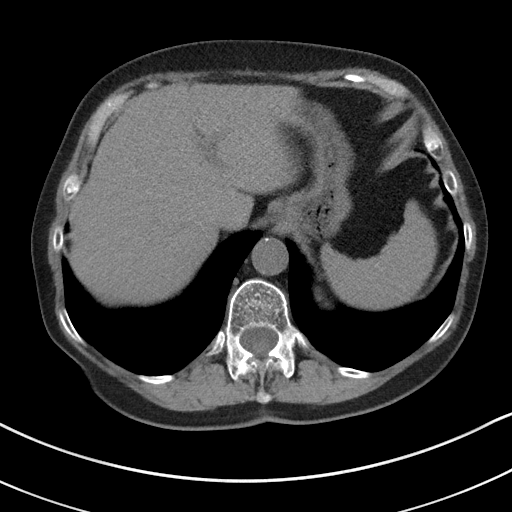
[im 81/85  soft-tissue]
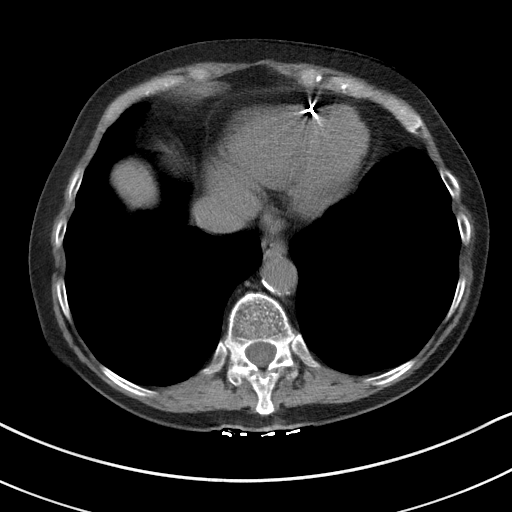

[Series 5: coronal st · coronal · 0.66mm/px · 3 of 85 slices shown]
[im 29/85  soft-tissue]
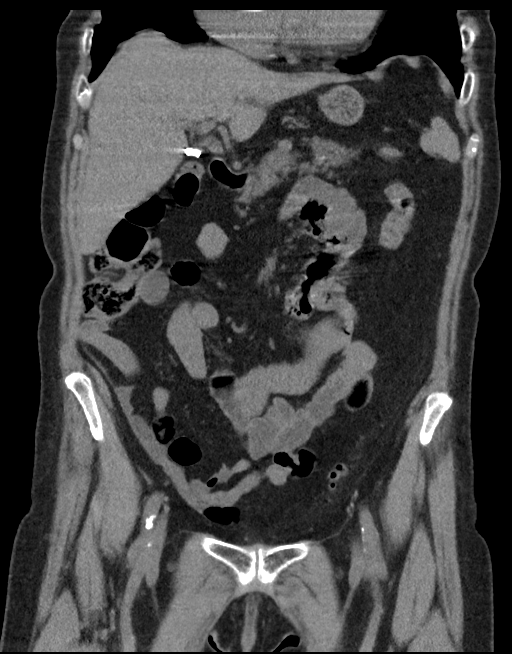
[im 38/85  soft-tissue]
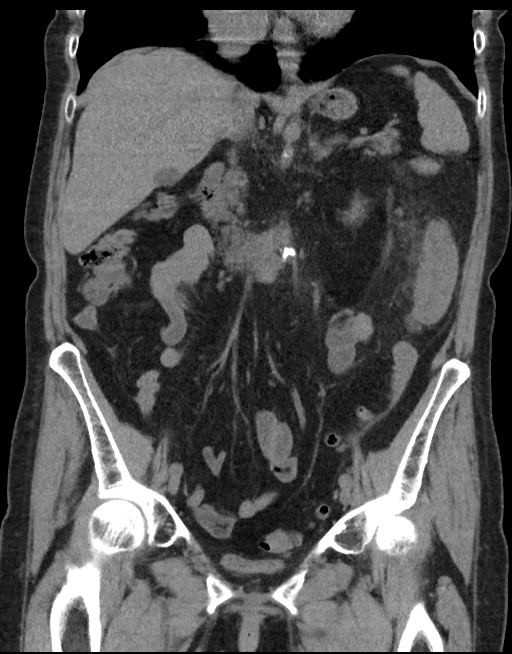
[im 47/85  soft-tissue]
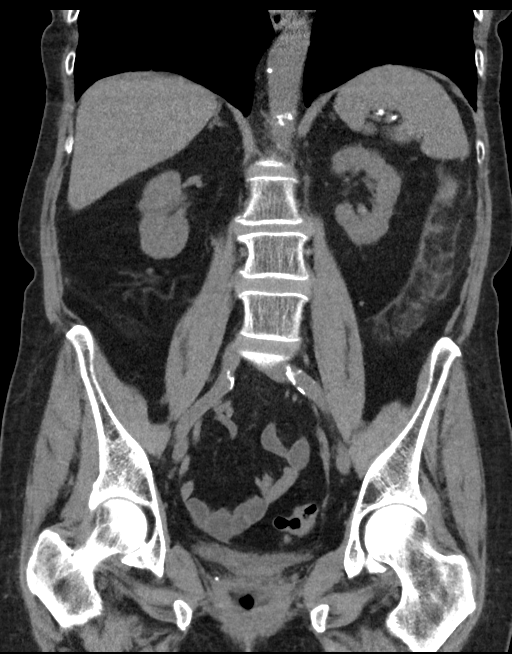

[16 of 46 positions shown; findings below may reference images not displayed]

FINDINGS: Motion degraded images.

Lower chest:  Lung bases are clear.

Hepatobiliary: Scattered hepatic cysts measuring up to 1.7 cm in
segment 6 (series 2/ image 23).

Status post cholecystectomy. No intrahepatic or extrahepatic ductal
dilatation.

Pancreas: Within normal limits.

Spleen: Within normal limits.

Adrenals/Urinary Tract: Adrenal glands are within normal limits.

Kidneys are within normal limits. No renal, ureteral, or bladder
calculi. No hydronephrosis.

Bladder is underdistended appear

Stomach/Bowel: Stomach is within normal limits.

No evidence of bowel obstruction.

Prior appendectomy.

Diverticulitis involving the mid descending colon in the left mid
abdomen (series 2/ image 37). Associated pericolonic wall
thickening/ inflammatory changes.

No drainable fluid collection/ abscess.  No free air.

Vascular/Lymphatic: Atherosclerotic calcifications of the abdominal
aorta and branch vessels.

No evidence of abdominal aortic aneurysm.

No suspicious abdominopelvic lymphadenopathy.

Reproductive: Status post hysterectomy.

Left ovary is within normal limits.  No right adnexal mass.

Other: Small volume pelvic ascites.

Musculoskeletal: Visualized osseous structures are within normal
limits.
IMPRESSION: Diverticulitis involving the mid descending colon in the left mid
abdomen.

No drainable fluid collection/ abscess.  No free air.

These results will be called to the ordering clinician or
representative by the [HOSPITAL] at the imaging location.

## 2017-07-10 ENCOUNTER — Other Ambulatory Visit: Payer: Medicare Other

## 2017-07-10 DIAGNOSIS — D500 Iron deficiency anemia secondary to blood loss (chronic): Secondary | ICD-10-CM

## 2017-07-10 LAB — FECAL OCCULT BLOOD, IMMUNOCHEMICAL: Fecal Occult Bld: POSITIVE — AB

## 2017-07-11 ENCOUNTER — Other Ambulatory Visit: Payer: Self-pay

## 2017-07-11 ENCOUNTER — Emergency Department
Admission: EM | Admit: 2017-07-11 | Discharge: 2017-07-11 | Disposition: A | Discharge status: Home / Self Care | Payer: Medicare Other | Attending: Emergency Medicine | Admitting: Emergency Medicine

## 2017-07-11 ENCOUNTER — Emergency Department: Payer: Medicare Other

## 2017-07-11 ENCOUNTER — Ambulatory Visit: Payer: Self-pay

## 2017-07-11 ENCOUNTER — Encounter: Payer: Self-pay | Admitting: Emergency Medicine

## 2017-07-11 DIAGNOSIS — Z79899 Other long term (current) drug therapy: Secondary | ICD-10-CM | POA: Diagnosis not present

## 2017-07-11 DIAGNOSIS — I2510 Atherosclerotic heart disease of native coronary artery without angina pectoris: Secondary | ICD-10-CM | POA: Diagnosis not present

## 2017-07-11 DIAGNOSIS — E1122 Type 2 diabetes mellitus with diabetic chronic kidney disease: Secondary | ICD-10-CM | POA: Insufficient documentation

## 2017-07-11 DIAGNOSIS — M25561 Pain in right knee: Secondary | ICD-10-CM | POA: Insufficient documentation

## 2017-07-11 DIAGNOSIS — I129 Hypertensive chronic kidney disease with stage 1 through stage 4 chronic kidney disease, or unspecified chronic kidney disease: Secondary | ICD-10-CM | POA: Insufficient documentation

## 2017-07-11 DIAGNOSIS — N183 Chronic kidney disease, stage 3 (moderate): Secondary | ICD-10-CM | POA: Insufficient documentation

## 2017-07-11 DIAGNOSIS — Z7901 Long term (current) use of anticoagulants: Secondary | ICD-10-CM | POA: Diagnosis not present

## 2017-07-11 NOTE — ED Triage Notes (Signed)
Pt comes into the ED via POV c/o fall that occurred yesterday and the patient fell on bilateral knees but she is currently stating her right knee is the one she's worried about.  Patient still has mobility to the knee and no obvious deformity is noted.  Patient in NAD at this time with even and unlabored respirations.

## 2017-07-11 NOTE — Telephone Encounter (Signed)
I agree with that advisement Looks like she is in the ED now  Will cc to PCP

## 2017-07-11 NOTE — ED Provider Notes (Signed)
[facility] Emergency Department Provider Note  ____________________________________________   First MD Initiated Contact with Patient 07/11/17 1206     (approximate)  I have reviewed the triage vital signs and the nursing notes.   HISTORY  Chief Complaint Knee Pain    HPI Sierra Kaiser is a 84 y.o. female presents emergency department complaining of right knee pain.  She states she has Mnire's disease and gets dizzy often.  She states this happened yesterday if she slid down the cabinet to sit on the floor.  She is unsure of how she hurt her knee but the knee is swollen and tender.  She denies any other injuries.  She did not hit her head.  She denies any headache.  Past Medical History:  Diagnosis Date  . Anxiety   . CAD (coronary artery disease)   . CKD stage 3 secondary to diabetes ([place])   . Esophageal reflux   . GI bleeding 2012   during EGD necessitating open surgery  . Hiatal hernia   . History of blood transfusion   . History of diverticulitis   . History of UTI   . HTN (hypertension)   . Hyperlipidemia   . Meniere's disease   . Mitral regurgitation   . Osteopenia 12/11/2015   T score -1.2 femur, -2.0 spine (12/2015)  . Permanent atrial fibrillation ([place])    a. permanent b. s/p PVI RFA at [facility] 10/14 c. failed amio (neuro toxicity) and Tikosyn d. 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 PUD (peptic ulcer disease)   . Raynaud's disease   . SNHL (sensorineural hearing loss)    right ear  . Syncopal episodes   . Tinnitus   . Ulcerative colitis ([place])    per prior records  . Urinary incontinence, urge     Patient Active Problem List   Diagnosis Date Noted  . Chronic fatigue 06/13/2017  . Health maintenance examination 01/30/2017  . Constipation 01/30/2017  . Family circumstance 04/03/2016  . Osteopenia 12/11/2015  . GERD (gastroesophageal reflux disease) 10/18/2015  . Iron deficiency anemia  05/26/2015  . Meniere's disease   . Hyperlipidemia   . CKD (chronic kidney disease) stage 3, GFR 30-59 ml/min (HCC)   . Mitral regurgitation   . Complete heart block ([place]) 05/06/2014  . Atrial fibrillation, permanent ([place]) 04/22/2014  . 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 S/P ablation of atrial fibrillation 07/25/2013  . Chronic anticoagulation 07/25/2013  . Chronic diastolic CHF (congestive heart failure) ([place]) 07/25/2013  . Exertional dyspnea 07/25/2013    Past Surgical History:  Procedure Laterality Date  . APPENDECTOMY  1973  . AV NODE ABLATION N/A 05/06/2014   Procedure: AV NODE ABLATION;  Surgeon: [name], MD;  Location: [facility] CATH LAB;  Service: Cardiovascular;  Laterality: N/A;  . CARDIAC CATHETERIZATION  2014   [facility]  . CARDIAC ELECTROPHYSIOLOGY STUDY AND ABLATION    . CHOLECYSTECTOMY  2010  . COLONOSCOPY  2010   polyps  . HEMORRHOID SURGERY    . LAPAROTOMY  2012   EGD biopsy led to bleeding - needed laparotomy to stop bleed (Dr Jimmye Norman at [facility])  . PERMANENT PACEMAKER INSERTION N/A 04/22/2014   STJ single chamber pacemaker implanted by Dr [name]  . TRANSESOPHAGEAL ECHOCARDIOGRAM WITH CARDIOVERSION  2014   [facility]  . UPPER GI ENDOSCOPY  2012   with polypectomy  . VAGINAL HYSTERECTOMY  1973   elective; ovaries remained  Prior to Admission medications   Medication Sig Start Date End Date Taking? Authorizing Provider  Calcium Carb-Cholecalciferol (CALCIUM-VITAMIN D) 600-400 MG-UNIT TABS Take 1 tablet by mouth daily.    [provider]  diphenhydrAMINE-zinc acetate (BENADRYL) cream Apply 1 application topically 2 (two) times daily as needed for itching.    [provider]  Ferrous Sulfate (IRON) 325 (65 Fe) MG TABS Take 1 tablet by mouth daily.    [provider]  FIBER PO Take 1 tablet by mouth daily. 325 mg    [provider]  fluticasone (FLONASE) 50 MCG/ACT nasal spray Place 1 spray into both nostrils daily.   10/08/16   [provider]  fluticasone (FLONASE) 50 MCG/ACT nasal spray SPRAY 2 SPRAYS INTO EACH NOSTRIL EVERY DAY 06/05/17   [name], MD  furosemide (LASIX) 40 MG tablet TAKE 1 TABLET DAILY Patient taking differently: TAKES 1/2 TABLET DAILY 05/07/17   [name], MD  omeprazole (PRILOSEC) 40 MG capsule TAKE 1 CAPSULE DAILY 05/09/17   [name], MD  Polyethyl Glycol-Propyl Glycol (SYSTANE OP) Place 1 drop into both eyes daily as needed (dry eyes).    [provider]  rosuvastatin (CRESTOR) 20 MG tablet TAKE 1 TABLET DAILY 11/27/16   Minna Merritts, MD  vitamin B-12 (CYANOCOBALAMIN) 1000 MCG tablet Take 1,000 mcg by mouth daily.    [provider]  XARELTO 15 MG TABS tablet TAKE 1 TABLET DAILY WITH SUPPER 04/18/17   Minna Merritts, MD    Allergies Gadolinium derivatives; Contrast media [iodinated diagnostic agents]; Metronidazole; and Diltiazem hcl  Family History  Problem Relation Age of Onset  . Arrhythmia Mother   . Hypertension Mother   . Diabetes Mother   . Heart failure Brother   . Heart failure Brother   . Cancer Brother        colon (with colostomy)  . Cancer Sister        breast  . Breast cancer Sister 26  . Alcohol abuse Brother   . Stroke Brother   . Diabetes Son     Social History Social History   Tobacco Use  . Smoking status: Never Smoker  . Smokeless tobacco: Never Used  Substance Use Topics  . Alcohol use: Yes    Alcohol/week: 0.6 - 1.2 oz    Types: 1 - 2 Glasses of wine per week    Comment: infrequent mixed drinks  . Drug use: No    Review of Systems  Constitutional: No fever/chills Eyes: No visual changes. ENT: No sore throat. Respiratory: Denies cough Genitourinary: Negative for dysuria. Musculoskeletal: Negative for back pain.  Positive for right knee pain.  Denies hip pain Skin: Negative for rash.    ____________________________________________   PHYSICAL EXAM:  VITAL SIGNS: ED Triage  Vitals  Enc Vitals Group     BP 07/11/17 1120 106/70     Pulse Rate 07/11/17 1120 70     Resp 07/11/17 1120 18     Temp 07/11/17 1120 98.2 F (36.8 C)     Temp Source 07/11/17 1120 Oral     SpO2 07/11/17 1120 98 %     Weight 07/11/17 1121 136 lb (61.7 kg)     Height 07/11/17 1121 5\' 8"  (1.727 m)     Head Circumference --      Peak Flow --      Pain Score 07/11/17 1120 4     Pain Loc --      Pain Edu? --  Excl. in [place]? --     Constitutional: Alert and oriented. Well appearing and in no acute distress. Eyes: Conjunctivae are normal.  Head: Atraumatic. Nose: No congestion/rhinnorhea. Mouth/Throat: Mucous membranes are moist.   Cardiovascular: Normal rate, regular rhythm. Respiratory: Normal respiratory effort.  No retractions GU: deferred Musculoskeletal: FROM all extremities, warm and well perfused.  The right hip is not tender.  The right knee is swollen and tender at the medial aspect.  The calf is not tender.  She is neurovascularly intact. Neurologic:  Normal speech and language.  Skin:  Skin is warm, dry and intact. No rash noted.  No redness or bruising is noted Psychiatric: Mood and affect are normal. Speech and behavior are normal.  ____________________________________________   LABS (all labs ordered are listed, but only abnormal results are displayed)  Labs Reviewed - No data to display ____________________________________________   ____________________________________________  RADIOLOGY  X-ray of the right knee is negative for any acute abnormality  ____________________________________________   PROCEDURES  Procedure(s) performed: Knee immobilizer was applied and walker was given to the patient via the tech  Procedures    ____________________________________________   INITIAL IMPRESSION / ASSESSMENT AND PLAN / ED COURSE  Pertinent labs & imaging results that were available during my care of the patient were reviewed by me and considered in my  medical decision making (see chart for details).  Patient is a 84 year old female presents emergency department complaining of right knee pain.  She states that she has Mnire's disease felt dizzy and slid down the cabinets to sit on the floor so she would not fall.  She is complaining of right knee pain  Physical exam the right knee is swollen and tender at the medial aspect.  She has full range of motion.  Hip is not tender.  He appears very well.  X-ray of the right knee is negative  Results were discussed with the patient and her husband.  She was given a knee immobilizer.  Gave her a walker as she is a little unsteady on her feet.  She was instructed to take Tylenol and apply ice.  She will follow-up with her regular doctor if there is no improvement in 3 to 5 days.  She was discharged in stable condition.      As part of my medical decision making, I reviewed the following data within the [place] notes reviewed and incorporated, Old chart reviewed, Radiograph reviewed x-ray of the right knee is negative, Notes from prior ED visits and [place] Controlled Substance Database  ____________________________________________   FINAL CLINICAL IMPRESSION(S) / ED DIAGNOSES  Final diagnoses:  Acute pain of right knee      NEW MEDICATIONS STARTED DURING THIS VISIT:  Discharge Medication List as of 07/11/2017 12:34 PM       Note:  This document was prepared using Dragon voice recognition software and may include unintentional dictation errors.    Versie Starks, PA-C 07/11/17 1638    Schuyler Amor, MD 07/17/17 2003

## 2017-07-11 NOTE — Discharge Instructions (Addendum)
Follow-up with your regular doctor if there is any problems.  Use medication as prescribed.  If you develop a headache or the knee is becoming worse please return to the emergency department.  Apply ice to all areas that hurt

## 2017-07-11 NOTE — Telephone Encounter (Signed)
Patient called and says "yesterday I was feeling faint. When I was standing in the kitchen, everything was getting black, so I slid down to the floor. I didn't pass out, but I hurt my knees. I'm still feeling that way. I took my BP and it was 108/48, P 70's. I'm having some SOB and was wondering if I should just go to the emergency room or come to the office to be checked out." I asked is she able to stand at all without feeling faint, she says "I see black when I stand up." I advised to go to the ED. I asked is someone available to drive her there, she says "my husband and he will hold on to me while walking me to the car." I advised to let them know she is feeling faint every time she stands, she verbalized understanding.

## 2017-07-11 NOTE — ED Notes (Signed)
Immobilizer applied and walker given/teaching by [employer], Runner, broadcasting/film/video.

## 2017-07-17 ENCOUNTER — Ambulatory Visit (INDEPENDENT_AMBULATORY_CARE_PROVIDER_SITE_OTHER): Payer: Medicare Other | Admitting: Family Medicine

## 2017-07-17 ENCOUNTER — Encounter: Payer: Self-pay | Admitting: Family Medicine

## 2017-07-17 VITALS — BP 116/62 | HR 74 | Temp 97.8°F | Ht 68.0 in | Wt 141.0 lb

## 2017-07-17 DIAGNOSIS — R0600 Dyspnea, unspecified: Secondary | ICD-10-CM

## 2017-07-17 DIAGNOSIS — N183 Chronic kidney disease, stage 3 unspecified: Secondary | ICD-10-CM

## 2017-07-17 DIAGNOSIS — D500 Iron deficiency anemia secondary to blood loss (chronic): Secondary | ICD-10-CM

## 2017-07-17 DIAGNOSIS — R0609 Other forms of dyspnea: Secondary | ICD-10-CM | POA: Diagnosis not present

## 2017-07-17 DIAGNOSIS — R55 Syncope and collapse: Secondary | ICD-10-CM | POA: Diagnosis not present

## 2017-07-17 DIAGNOSIS — R5382 Chronic fatigue, unspecified: Secondary | ICD-10-CM

## 2017-07-17 DIAGNOSIS — I482 Chronic atrial fibrillation: Secondary | ICD-10-CM | POA: Diagnosis not present

## 2017-07-17 DIAGNOSIS — M25561 Pain in right knee: Secondary | ICD-10-CM

## 2017-07-17 DIAGNOSIS — I4821 Permanent atrial fibrillation: Secondary | ICD-10-CM

## 2017-07-17 DIAGNOSIS — R195 Other fecal abnormalities: Secondary | ICD-10-CM | POA: Diagnosis not present

## 2017-07-17 LAB — CBC WITH DIFFERENTIAL/PLATELET
Basophils Absolute: 0 10*3/uL (ref 0.0–0.1)
Basophils Relative: 0.8 % (ref 0.0–3.0)
Eosinophils Absolute: 0.2 10*3/uL (ref 0.0–0.7)
Eosinophils Relative: 2.6 % (ref 0.0–5.0)
HCT: 41.4 % (ref 36.0–46.0)
Hemoglobin: 13.6 g/dL (ref 12.0–15.0)
Lymphocytes Relative: 20.8 % (ref 12.0–46.0)
Lymphs Abs: 1.2 10*3/uL (ref 0.7–4.0)
MCHC: 32.8 g/dL (ref 30.0–36.0)
MCV: 85.1 fl (ref 78.0–100.0)
Monocytes Absolute: 0.5 10*3/uL (ref 0.1–1.0)
Monocytes Relative: 8.5 % (ref 3.0–12.0)
Neutro Abs: 3.8 10*3/uL (ref 1.4–7.7)
Neutrophils Relative %: 67.3 % (ref 43.0–77.0)
Platelets: 187 10*3/uL (ref 150.0–400.0)
RBC: 4.87 Mil/uL (ref 3.87–5.11)
RDW: 22.2 % — ABNORMAL HIGH (ref 11.5–15.5)
WBC: 5.7 10*3/uL (ref 4.0–10.5)

## 2017-07-17 LAB — RENAL FUNCTION PANEL
Albumin: 4.3 g/dL (ref 3.5–5.2)
BUN: 21 mg/dL (ref 6–23)
CO2: 26 mEq/L (ref 19–32)
Calcium: 9.6 mg/dL (ref 8.4–10.5)
Chloride: 106 mEq/L (ref 96–112)
Creatinine, Ser: 1.47 mg/dL — ABNORMAL HIGH (ref 0.40–1.20)
GFR: 36.15 mL/min — ABNORMAL LOW (ref >60.00)
Glucose, Bld: 95 mg/dL (ref 70–99)
Phosphorus: 3.1 mg/dL (ref 2.3–4.6)
Potassium: 4 mEq/L (ref 3.5–5.1)
Sodium: 146 mEq/L — ABNORMAL HIGH (ref 135–145)

## 2017-07-17 NOTE — Assessment & Plan Note (Signed)
Point tender right at pes anserine bursa. Anticipate pes anserine bursitis after recent falls. Reviewed ER xray with patient, discussed rest, ice/heat, and provided with exercises from [facility] pt advisor. Update if not improving with treatment.

## 2017-07-17 NOTE — Progress Notes (Signed)
BP 116/62 (BP Location: Left Arm, Patient Position: Sitting, Cuff Size: Normal)   Pulse 74   Temp 97.8 F (36.6 C) (Oral)   Ht 5\' 8"  (1.727 m)   Wt 141 lb (64 kg)   SpO2 94%   BMI 21.44 kg/m    CC: ER f/u visit Subjective:    Patient ID: Sierra Kaiser, female    DOB: June 18, 1933, 84 y.o.   MRN: [id number]  Sierra Kaiser is a 84 y.o. female presenting on 07/17/2017 for Knee Injury [place] at home due to feeling of passing out and injured right knee on 07/10/17. Was seen at [facility] on 07/11/17. Had xray- neg and was given brace to wear. Says she is doing better today. ) and Discuss iron (Wants to discuss iron and infusion.)   Recent fall at home 07/10/2017 after presyncope "felt I was blacking out" without LOC, injured R knee. BP at that time 108/48, HR 70s. Seen at [facility] ER, presyncope was attributed to meniere's. Knee xray was non acute, placed in immobilize. She has been treating with ice packs. Knee is much better now.   Permanent atrial fibrillation s/p RFA at [facility] 2014, single chamber PPM 04/2014, AV nodal ablation 2016, on chronic xarelto.   Recent dx iron deficiency anemia with positive iFOB that resulted last week.  COLONOSCOPY 2010 polyps (in [place] ?Dr Ginnie Smart) UPPER GI ENDOSCOPY 2012 with polypectomy - complicated by GI hemorrhage needing surgical intervention.   She is currently taking 1 iron tablet daily - increasing constipation, turning stools dark.   Currently seeing ENT Dr Pryor Ochoa for R hearing loss in h/o Meniere's disease.   Relevant past medical, surgical, family and social history reviewed and updated as indicated. Interim medical history since our last visit reviewed. Allergies and medications reviewed and updated. Outpatient Medications Prior to Visit  Medication Sig Dispense Refill  . Calcium Carb-Cholecalciferol (CALCIUM-VITAMIN D) 600-400 MG-UNIT TABS Take 1 tablet by mouth daily.    . diphenhydrAMINE-zinc acetate (BENADRYL) cream Apply 1 application  topically 2 (two) times daily as needed for itching.    . Ferrous Sulfate (IRON) 325 (65 Fe) MG TABS Take 1 tablet by mouth daily.    Marland Kitchen FIBER PO Take 1 tablet by mouth daily. 325 mg    . fluticasone (FLONASE) 50 MCG/ACT nasal spray Place 1 spray into both nostrils daily.     . fluticasone (FLONASE) 50 MCG/ACT nasal spray SPRAY 2 SPRAYS INTO EACH NOSTRIL EVERY DAY 16 g 3  . furosemide (LASIX) 40 MG tablet TAKE 1 TABLET DAILY (Patient taking differently: TAKES 1/2 TABLET DAILY) 90 tablet 0  . omeprazole (PRILOSEC) 40 MG capsule TAKE 1 CAPSULE DAILY 90 capsule 2  . Polyethyl Glycol-Propyl Glycol (SYSTANE OP) Place 1 drop into both eyes daily as needed (dry eyes).    . rosuvastatin (CRESTOR) 20 MG tablet TAKE 1 TABLET DAILY 90 tablet 2  . vitamin B-12 (CYANOCOBALAMIN) 1000 MCG tablet Take 1,000 mcg by mouth daily.    Alveda Reasons 15 MG TABS tablet TAKE 1 TABLET DAILY WITH SUPPER 90 tablet 3   No facility-administered medications prior to visit.     Past Medical History:  Diagnosis Date  . Anxiety   . CAD (coronary artery disease)   . CKD stage 3 secondary to diabetes ([place])   . Esophageal reflux   . GI bleeding 2012   during EGD necessitating open surgery  . Hiatal hernia   . History of blood transfusion   . History  of diverticulitis   . History of UTI   . HTN (hypertension)   . Hyperlipidemia   . Meniere's disease 1970s  . Mitral regurgitation   . Osteopenia 12/11/2015   T score -1.2 femur, -2.0 spine (12/2015)  . Permanent atrial fibrillation ([place])    a. permanent b. s/p PVI RFA at [facility] 10/14 c. failed amio (neuro toxicity) and Tikosyn d. 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 PUD (peptic ulcer disease)   . Raynaud's disease   . SNHL (sensorineural hearing loss)    right ear  . Syncopal episodes   . Tinnitus   . Ulcerative colitis ([place])    per prior records  . Urinary incontinence, urge     Past Surgical History:  Procedure Laterality Date    . APPENDECTOMY  1973  . AV NODE ABLATION N/A 05/06/2014   Procedure: AV NODE ABLATION;  Surgeon: [name], MD;  Location: [facility] CATH LAB;  Service: Cardiovascular;  Laterality: N/A;  . CARDIAC CATHETERIZATION  2014   [facility]  . CARDIAC ELECTROPHYSIOLOGY STUDY AND ABLATION    . CHOLECYSTECTOMY  2010  . COLONOSCOPY  2010   polyps  . HEMORRHOID SURGERY    . LAPAROTOMY  2012   EGD biopsy led to bleeding - needed laparotomy to stop bleed (Dr Jimmye Norman at [facility])  . PERMANENT PACEMAKER INSERTION N/A 04/22/2014   STJ single chamber pacemaker implanted by Dr [name]  . TRANSESOPHAGEAL ECHOCARDIOGRAM WITH CARDIOVERSION  2014   [facility]  . UPPER GI ENDOSCOPY  2012   with polypectomy  . VAGINAL HYSTERECTOMY  1973   elective; ovaries remained    Family History  Problem Relation Age of Onset  . Arrhythmia Mother   . Hypertension Mother   . Diabetes Mother   . Heart failure Brother   . Heart failure Brother   . Cancer Brother        colon (with colostomy)  . Cancer Sister        breast  . Breast cancer Sister 79  . Alcohol abuse Brother   . Stroke Brother   . Diabetes Son     Social History   Tobacco Use  . Smoking status: Never Smoker  . Smokeless tobacco: Never Used  Substance Use Topics  . Alcohol use: Yes    Alcohol/week: 0.6 - 1.2 oz    Types: 1 - 2 Glasses of wine per week    Comment: infrequent mixed drinks  . Drug use: No    Per HPI unless specifically indicated in ROS section below Review of Systems     Objective:    BP 116/62 (BP Location: Left Arm, Patient Position: Sitting, Cuff Size: Normal)   Pulse 74   Temp 97.8 F (36.6 C) (Oral)   Ht 5\' 8"  (1.727 m)   Wt 141 lb (64 kg)   SpO2 94%   BMI 21.44 kg/m   Wt Readings from Last 3 Encounters:  07/17/17 141 lb (64 kg)  07/11/17 136 lb (61.7 kg)  06/13/17 143 lb 8 oz (65.1 kg)    Physical Exam  Constitutional: She appears well-developed and well-nourished. No distress.  HENT:  Mouth/Throat:  Oropharynx is clear and moist. No oropharyngeal exudate.  Eyes: Pupils are equal, round, and reactive to light. EOM are normal.  Neck: Normal range of motion. Neck supple.  Cardiovascular: Normal rate, regular rhythm, normal heart sounds and intact distal pulses.  No murmur heard. Pulmonary/Chest: Effort normal  and breath sounds normal. No stridor. No respiratory distress. She has no wheezes. She has no rales.  Musculoskeletal: She exhibits no edema.  Knee exam: No deformity on inspection. Pain to palpation at bilateral R>L pes anserine bursa No effusion/swelling noted. FROM in flex/extension without crepitus. No popliteal fullness.  Skin: Skin is warm and dry. No erythema.  Nursing note and vitals reviewed.  Results for orders placed or performed in visit on 07/10/17  Fecal occult blood, imunochemical  Result Value Ref Range   Fecal Occult Bld Positive (A) Negative  DG Knee Complete 4 Views Right CLINICAL DATA:  Right knee pain after fall.  EXAM: RIGHT KNEE - COMPLETE 4+ VIEW  COMPARISON:  None.  FINDINGS: No evidence of fracture, dislocation, or joint effusion. No evidence of arthropathy or other focal bone abnormality. Soft tissues are unremarkable.  IMPRESSION: Normal right knee.  Electronically Signed   By: Marijo Conception, M.D.   On: 07/11/2017 12:16      Assessment & Plan:   Problem List Items Addressed This Visit    Atrial fibrillation, permanent ([place])    Update CBC today - may need to hold xarelto pending Hgb levels.       Chronic fatigue   CKD (chronic kidney disease) stage 3, GFR 30-59 ml/min (HCC)    Update renal panel.       Relevant Orders   CBC with Differential/Platelet   Renal function panel   Exertional dyspnea   Iron deficiency anemia - Primary    Marked iron deficiency may be contributing to her chronic exertional dyspnea, now with presyncope leading to recent fall and knee injury - will schedule iron infusion. Will refer to GI for further  evaluation of IDA.       Relevant Orders   CBC with Differential/Platelet   Ambulatory referral to Gastroenterology   Right knee pain    Point tender right at pes anserine bursa. Anticipate pes anserine bursitis after recent falls. Reviewed ER xray with patient, discussed rest, ice/heat, and provided with exercises from [facility] pt advisor. Update if not improving with treatment.        Other Visit Diagnoses    Pre-syncope       Positive occult stool blood test       Relevant Orders   Ambulatory referral to Gastroenterology       No orders of the defined types were placed in this encounter.  Orders Placed This Encounter  Procedures  . CBC with Differential/Platelet  . Renal function panel  . Ambulatory referral to Gastroenterology    Referral Priority:   Routine    Referral Type:   Consultation    Referral Reason:   Specialty Services Required    Number of Visits Requested:   1    Follow up plan: Return in about 1 month (around 08/14/2017) for follow up visit.  [name], MD

## 2017-07-17 NOTE — Patient Instructions (Addendum)
I do want to schedule an iron infusion at short stay.  We will refer you to GI for blood in stool with new iron deficiency anemia. See Rosaria Ferries or Anastasiya for referral.  Labs today then we will be in touch with further plan.  I think you have knee bursitis - treat with exercises provided today. Return in 1 month for follow up visit.

## 2017-07-17 NOTE — Assessment & Plan Note (Addendum)
Update renal panel 

## 2017-07-17 NOTE — Assessment & Plan Note (Signed)
Update CBC today - may need to hold xarelto pending Hgb levels.

## 2017-07-17 NOTE — Assessment & Plan Note (Addendum)
Marked iron deficiency may be contributing to her chronic exertional dyspnea, now with presyncope leading to recent fall and knee injury - will schedule iron infusion. Will refer to GI for further evaluation of IDA.

## 2017-07-19 ENCOUNTER — Other Ambulatory Visit: Payer: Self-pay | Admitting: Nurse Practitioner

## 2017-07-19 ENCOUNTER — Telehealth: Payer: Self-pay | Admitting: *Deleted

## 2017-07-19 DIAGNOSIS — R42 Dizziness and giddiness: Secondary | ICD-10-CM

## 2017-07-19 NOTE — Telephone Encounter (Signed)
I did explain to the pt that she the procedures are separate and will be conducted at separate facilities.  I also mentioned she will be contacted by [facility] for the infusion to schedule an appt.  I'll double check that.

## 2017-07-19 NOTE — Telephone Encounter (Signed)
Spoke with pt explaining the infusion is a separate procedure and will be done at a different location.  Informed pt their office will contact her to schedule an appt.  Pt verbalizes understanding.

## 2017-07-19 NOTE — Telephone Encounter (Signed)
Copied from [place] [id number]. Topic: Referral - Question >> Jul 19, 2017 10:27 AM Synthia Innocent wrote: Reason for CRM: Patient states she was to have a iron infusion with GI, is this correct? Patient seen GI yesterday and they only scheduled her for colonoscopy. Please advise

## 2017-07-19 NOTE — Telephone Encounter (Signed)
I spoke with Sierra Kaiser, who is filling in for Sierra Kaiser, at [facility] Short Stay.  She said the pt can call [phone] tomorrow to schedule infusion.  Spoke with pt's husband, Sierra Kaiser (on dpr), asking him to have pt call phn # above tomorrow to schedule the iron infusion.  Says he will relay the message.

## 2017-07-19 NOTE — Telephone Encounter (Signed)
I ordered iron infusion - we should be contacting her to schedule, not GI office?

## 2017-07-23 ENCOUNTER — Other Ambulatory Visit (HOSPITAL_COMMUNITY): Payer: Self-pay | Admitting: *Deleted

## 2017-07-23 ENCOUNTER — Telehealth: Payer: Self-pay

## 2017-07-23 NOTE — Telephone Encounter (Signed)
   [facility] Pre-operative Risk Assessment    Request for surgical clearance:  1. What type of surgery is being performed? Colon EGD   2. When is this surgery scheduled? 08/29/2017   3. What type of clearance is required (medical clearance vs. Pharmacy clearance to hold med vs. Both)? Both  4. Are there any medications that need to be held prior to surgery and how long? Xarelto, Asking for how long  5. Practice name and name of physician performing surgery?  [facility], Dr. Gustavo Lah        6. What is your office phone number? [phone]   7.   What is your office fax number? [phone]  8.   Anesthesia type (None, local, MAC, general) ? Monitored    Sierra Kaiser 07/23/2017, 4:13 PM  _________________________________________________________________   (provider comments below)

## 2017-07-24 ENCOUNTER — Ambulatory Visit (HOSPITAL_COMMUNITY)
Admission: RE | Admit: 2017-07-24 | Discharge: 2017-07-24 | Disposition: A | Discharge status: Home / Self Care | Payer: Medicare Other | Source: Ambulatory Visit | Attending: Family Medicine | Admitting: Family Medicine

## 2017-07-24 DIAGNOSIS — D509 Iron deficiency anemia, unspecified: Secondary | ICD-10-CM | POA: Diagnosis present

## 2017-07-24 MED ORDER — SODIUM CHLORIDE 0.9 % IV SOLN
510.0000 mg | INTRAVENOUS | Status: DC
  Administered 2017-07-24: 510 mg via INTRAVENOUS
  Filled 2017-07-24: qty 17

## 2017-07-24 NOTE — Discharge Instructions (Signed)

## 2017-07-25 ENCOUNTER — Ambulatory Visit
Admission: RE | Admit: 2017-07-25 | Discharge: 2017-07-25 | Disposition: A | Discharge status: Home / Self Care | Payer: Medicare Other | Source: Ambulatory Visit | Attending: Nurse Practitioner | Admitting: Nurse Practitioner

## 2017-07-25 DIAGNOSIS — R42 Dizziness and giddiness: Secondary | ICD-10-CM | POA: Diagnosis present

## 2017-07-25 DIAGNOSIS — I6782 Cerebral ischemia: Secondary | ICD-10-CM | POA: Diagnosis not present

## 2017-07-25 DIAGNOSIS — G319 Degenerative disease of nervous system, unspecified: Secondary | ICD-10-CM | POA: Insufficient documentation

## 2017-07-25 NOTE — Telephone Encounter (Signed)
Pt takes Xarelto for afib with CHADS2VASc score of 6 (age x2, sex, CHF, CAD, HTN). "CKD secondary to diabetes" is listed under PMH however pt is not on any antidiabetic medications so will not count this in risk stratification. SCr 1.47, CrCl 71mL/min, pt appropriately on lower dose of Xarelto 15mg .  Recommend holding Xarelto for 2 days prior to procedure due to reduced renal function and resuming within 24 hours after procedure due to elevated CV risk.

## 2017-07-26 NOTE — Telephone Encounter (Signed)
Left message for the patient to call back and speak to on call preop APP Monday through Friday between 1-5 pm.

## 2017-07-31 ENCOUNTER — Ambulatory Visit (HOSPITAL_COMMUNITY)
Admission: RE | Admit: 2017-07-31 | Discharge: 2017-07-31 | Disposition: A | Discharge status: Home / Self Care | Payer: Medicare Other | Source: Ambulatory Visit | Attending: Family Medicine | Admitting: Family Medicine

## 2017-07-31 DIAGNOSIS — D509 Iron deficiency anemia, unspecified: Secondary | ICD-10-CM | POA: Insufficient documentation

## 2017-07-31 MED ORDER — SODIUM CHLORIDE 0.9 % IV SOLN
510.0000 mg | INTRAVENOUS | Status: DC
  Administered 2017-07-31: 510 mg via INTRAVENOUS
  Filled 2017-07-31: qty 17

## 2017-08-02 NOTE — Telephone Encounter (Signed)
   Primary Cardiologist: No primary care provider on file.  Chart reviewed as part of pre-operative protocol coverage. Patient was contacted 08/02/2017 in reference to pre-operative risk assessment for pending surgery as outlined below.  Sierra Kaiser was last seen on 05/08/17  by Dr. [name].  Since that day, Sierra Kaiser has done well.Spoke with the patient and sister "Sierra Kaiser" over the phone. She has stable SOB but no chest tightness. She has IDA. Recent + stool guaiac s/p Iron infusion x 2 with improved SOB. She continues to take her Xarelto.   Therefore, based on ACC/AHA guidelines, the patient would be at acceptable risk for the planned procedure without further cardiovascular testing.   I will route this recommendation to the requesting party via Epic fax function and remove from pre-op pool.  Please call with questions.  [place], [place] 08/02/2017, 1:39 PM

## 2017-08-05 ENCOUNTER — Other Ambulatory Visit: Payer: Self-pay | Admitting: Internal Medicine

## 2017-08-09 ENCOUNTER — Telehealth: Payer: Self-pay | Admitting: Cardiovascular Disease

## 2017-08-09 NOTE — Telephone Encounter (Signed)
Fax received from [employer] requesting a prior authorization for the patient's xarelto 15 mg- 1 tablet by mouth once daily.  I called [phone] and spoke with the [employer] rep. Prior approval was obtained for the patient's Xarelto- good from 07/10/17-08/09/18.  Case ID #- [id number].

## 2017-08-15 ENCOUNTER — Ambulatory Visit (INDEPENDENT_AMBULATORY_CARE_PROVIDER_SITE_OTHER): Payer: Medicare Other | Admitting: Family Medicine

## 2017-08-15 ENCOUNTER — Ambulatory Visit (INDEPENDENT_AMBULATORY_CARE_PROVIDER_SITE_OTHER): Payer: Medicare Other | Admitting: *Deleted

## 2017-08-15 ENCOUNTER — Encounter: Payer: Self-pay | Admitting: Family Medicine

## 2017-08-15 VITALS — BP 118/62 | HR 72 | Temp 98.2°F | Ht 68.0 in | Wt 139.5 lb

## 2017-08-15 DIAGNOSIS — R0600 Dyspnea, unspecified: Secondary | ICD-10-CM

## 2017-08-15 DIAGNOSIS — M25561 Pain in right knee: Secondary | ICD-10-CM

## 2017-08-15 DIAGNOSIS — I442 Atrioventricular block, complete: Secondary | ICD-10-CM

## 2017-08-15 DIAGNOSIS — K5909 Other constipation: Secondary | ICD-10-CM

## 2017-08-15 DIAGNOSIS — D509 Iron deficiency anemia, unspecified: Secondary | ICD-10-CM | POA: Diagnosis not present

## 2017-08-15 DIAGNOSIS — R5382 Chronic fatigue, unspecified: Secondary | ICD-10-CM

## 2017-08-15 DIAGNOSIS — R0609 Other forms of dyspnea: Secondary | ICD-10-CM

## 2017-08-15 DIAGNOSIS — N183 Chronic kidney disease, stage 3 unspecified: Secondary | ICD-10-CM

## 2017-08-15 NOTE — Assessment & Plan Note (Signed)
Some improved after iron infusion.

## 2017-08-15 NOTE — Assessment & Plan Note (Signed)
Reviewed bowel regimen.  Encouraged daily benefiber. Discussed senna. Did not tolerate miralax previously.

## 2017-08-15 NOTE — Patient Instructions (Addendum)
I'm glad you're doing some better! We will await colonoscopy results.  Continue benefiber with water daily.  Continue topical medicine to knee.  Return 1 week after colonoscopy for repeat lab visit only at our office.

## 2017-08-15 NOTE — Assessment & Plan Note (Signed)
This has improved some after iron infusion - ?IDA related.

## 2017-08-15 NOTE — Progress Notes (Signed)
Remote pacemaker transmission.   

## 2017-08-15 NOTE — Assessment & Plan Note (Signed)
Still consistent with pes anserine bursitis. Discussed topical aspercreme use, rec try exercises provided last visit.

## 2017-08-15 NOTE — Assessment & Plan Note (Addendum)
Pending colonoscopy for further evaluation of etiology. Recheck levels 1 wk after colonoscopy to monitor iron infusion effect.

## 2017-08-15 NOTE — Progress Notes (Signed)
BP 118/62 (BP Location: Left Arm, Patient Position: Sitting, Cuff Size: Normal)   Pulse 72   Temp 98.2 F (36.8 C) (Oral)   Ht 5\' 8"  (1.727 m)   Wt 139 lb 8 oz (63.3 kg)   SpO2 97%   BMI 21.21 kg/m    CC: 1 mo f/u visit anemia Subjective:    Patient ID: Sierra Kaiser, female    DOB: June 14, 1933, 84 y.o.   MRN: [id number]  HPI: Sierra Kaiser is a 84 y.o. female presenting on 08/15/2017 for Follow-up (Here for 1 mo f/u.  Pt accompanied by her sister.)   See prior note for details. Here with sister [name].  Seen here last month with recent dx iron deficiency anemia with positive iFOB. Has established with GI, planned colonoscopy 08/29/2017. She has started iron tablet daily and received iron infusion 5/21 and again 5/28 - has felt well but fatigue/tirnedness comes and goes. Chronic constipation worse with oral iron. Bowel regimen - taking benefiber daily, using glycerin suppositories. Did not tolerate miralax.   She has also seen neurology for ongoing dizziness - thought related to anemia, meniere's.   Ongoing knee pain after fall. Treating with tylenol ES 2 tab BID. Recent trip to beach. Last eval treated for possible knee bursitis - has not been doing exercises.   Permanent atrial fibrillation s/p RFA at [facility] 2014, single chamber PPM 04/2014, AV nodal ablation 2016, on chronic xarelto.   Relevant past medical, surgical, family and social history reviewed and updated as indicated. Interim medical history since our last visit reviewed. Allergies and medications reviewed and updated. Outpatient Medications Prior to Visit  Medication Sig Dispense Refill  . Calcium Carb-Cholecalciferol (CALCIUM-VITAMIN D) 600-400 MG-UNIT TABS Take 1 tablet by mouth daily.    . diphenhydrAMINE-zinc acetate (BENADRYL) cream Apply 1 application topically 2 (two) times daily as needed for itching.    . Ferrous Sulfate (IRON) 325 (65 Fe) MG TABS Take 1 tablet by mouth daily.    Marland Kitchen FIBER PO Take 1 tablet by  mouth daily. 325 mg    . fluticasone (FLONASE) 50 MCG/ACT nasal spray SPRAY 2 SPRAYS INTO EACH NOSTRIL EVERY DAY 16 g 3  . furosemide (LASIX) 40 MG tablet TAKE 1 TABLET DAILY 90 tablet 0  . omeprazole (PRILOSEC) 40 MG capsule TAKE 1 CAPSULE DAILY 90 capsule 2  . Polyethyl Glycol-Propyl Glycol (SYSTANE OP) Place 1 drop into both eyes daily as needed (dry eyes).    . rosuvastatin (CRESTOR) 20 MG tablet TAKE 1 TABLET DAILY 90 tablet 2  . vitamin B-12 (CYANOCOBALAMIN) 1000 MCG tablet Take 1,000 mcg by mouth daily.    Alveda Reasons 15 MG TABS tablet TAKE 1 TABLET DAILY WITH SUPPER 90 tablet 3  . fluticasone (FLONASE) 50 MCG/ACT nasal spray Place 1 spray into both nostrils daily.      No facility-administered medications prior to visit.      Per HPI unless specifically indicated in ROS section below Review of Systems     Objective:    BP 118/62 (BP Location: Left Arm, Patient Position: Sitting, Cuff Size: Normal)   Pulse 72   Temp 98.2 F (36.8 C) (Oral)   Ht 5\' 8"  (1.727 m)   Wt 139 lb 8 oz (63.3 kg)   SpO2 97%   BMI 21.21 kg/m   Wt Readings from Last 3 Encounters:  08/15/17 139 lb 8 oz (63.3 kg)  07/31/17 140 lb (63.5 kg)  07/24/17 143 lb (64.9 kg)  Physical Exam  Constitutional: She appears well-developed and well-nourished. No distress.  HENT:  Mouth/Throat: Oropharynx is clear and moist. No oropharyngeal exudate.  Cardiovascular: Normal rate, regular rhythm and normal heart sounds.  No murmur heard. Pulmonary/Chest: Effort normal and breath sounds normal. No respiratory distress. She has no wheezes. She has no rales.  Musculoskeletal: She exhibits no edema.  Ongoing tenderness at R pes anserine bursa No pain with testing of R meniscus  Skin: Skin is warm and dry. No rash noted.  Nursing note and vitals reviewed.  Results for orders placed or performed in visit on 07/17/17  CBC with Differential/Platelet  Result Value Ref Range   WBC 5.7 4.0 - 10.5 K/uL   RBC 4.87 3.87 -  5.11 Mil/uL   Hemoglobin 13.6 12.0 - 15.0 g/dL   HCT 41.4 36.0 - 46.0 %   MCV 85.1 78.0 - 100.0 fl   MCHC 32.8 30.0 - 36.0 g/dL   RDW 22.2 (H) 11.5 - 15.5 %   Platelets 187.0 150.0 - 400.0 K/uL   Neutrophils Relative % 67.3 43.0 - 77.0 %   Lymphocytes Relative 20.8 12.0 - 46.0 %   Monocytes Relative 8.5 3.0 - 12.0 %   Eosinophils Relative 2.6 0.0 - 5.0 %   Basophils Relative 0.8 0.0 - 3.0 %   Neutro Abs 3.8 1.4 - 7.7 K/uL   Lymphs Abs 1.2 0.7 - 4.0 K/uL   Monocytes Absolute 0.5 0.1 - 1.0 K/uL   Eosinophils Absolute 0.2 0.0 - 0.7 K/uL   Basophils Absolute 0.0 0.0 - 0.1 K/uL  Renal function panel  Result Value Ref Range   Sodium 146 (H) 135 - 145 mEq/L   Potassium 4.0 3.5 - 5.1 mEq/L   Chloride 106 96 - 112 mEq/L   CO2 26 19 - 32 mEq/L   Calcium 9.6 8.4 - 10.5 mg/dL   Albumin 4.3 3.5 - 5.2 g/dL   BUN 21 6 - 23 mg/dL   Creatinine, Ser 1.47 (H) 0.40 - 1.20 mg/dL   Glucose, Bld 95 70 - 99 mg/dL   Phosphorus 3.1 2.3 - 4.6 mg/dL   GFR 36.15 (L) >60.00 mL/min      Assessment & Plan:   Problem List Items Addressed This Visit    Right knee pain    Still consistent with pes anserine bursitis. Discussed topical aspercreme use, rec try exercises provided last visit.       Iron deficiency anemia - Primary    Pending colonoscopy for further evaluation of etiology. Recheck levels 1 wk after colonoscopy to monitor iron infusion effect.       Relevant Orders   IBC panel   Ferritin   CBC with Differential/Platelet   Exertional dyspnea    This has improved some after iron infusion - ?IDA related.       CKD (chronic kidney disease) stage 3, GFR 30-59 ml/min (HCC)   Relevant Orders   Renal function panel   Chronic fatigue    Some improved after iron infusion.       Chronic constipation    Reviewed bowel regimen.  Encouraged daily benefiber. Discussed senna. Did not tolerate miralax previously.           No orders of the defined types were placed in this  encounter.  Orders Placed This Encounter  Procedures  . IBC panel    Standing Status:   Future    Standing Expiration Date:   08/16/2018  . Ferritin    Standing Status:  Future    Standing Expiration Date:   08/16/2018  . CBC with Differential/Platelet    Standing Status:   Future    Standing Expiration Date:   08/16/2018  . Renal function panel    Standing Status:   Future    Standing Expiration Date:   08/16/2018    Follow up plan: No follow-ups on file.  [name], MD

## 2017-08-16 ENCOUNTER — Encounter: Payer: Self-pay | Admitting: Cardiology

## 2017-08-26 ENCOUNTER — Other Ambulatory Visit: Payer: Self-pay | Admitting: Cardiovascular Disease

## 2017-08-28 ENCOUNTER — Encounter: Payer: Self-pay | Admitting: *Deleted

## 2017-08-29 ENCOUNTER — Encounter
Admission: RE | Disposition: A | Discharge status: Home / Self Care | Payer: Self-pay | Source: Ambulatory Visit | Attending: Internal Medicine

## 2017-08-29 ENCOUNTER — Ambulatory Visit: Payer: Medicare Other | Admitting: Certified Registered"

## 2017-08-29 ENCOUNTER — Ambulatory Visit
Admission: RE | Admit: 2017-08-29 | Discharge: 2017-08-29 | Disposition: A | Discharge status: Home / Self Care | Payer: Medicare Other | Source: Ambulatory Visit | Attending: Internal Medicine | Admitting: Internal Medicine

## 2017-08-29 ENCOUNTER — Other Ambulatory Visit: Payer: Self-pay

## 2017-08-29 ENCOUNTER — Encounter: Payer: Self-pay | Admitting: *Deleted

## 2017-08-29 DIAGNOSIS — I7300 Raynaud's syndrome without gangrene: Secondary | ICD-10-CM | POA: Diagnosis not present

## 2017-08-29 DIAGNOSIS — K317 Polyp of stomach and duodenum: Secondary | ICD-10-CM | POA: Diagnosis not present

## 2017-08-29 DIAGNOSIS — Z86010 Personal history of colonic polyps: Secondary | ICD-10-CM | POA: Insufficient documentation

## 2017-08-29 DIAGNOSIS — D123 Benign neoplasm of transverse colon: Secondary | ICD-10-CM | POA: Insufficient documentation

## 2017-08-29 DIAGNOSIS — I2510 Atherosclerotic heart disease of native coronary artery without angina pectoris: Secondary | ICD-10-CM | POA: Insufficient documentation

## 2017-08-29 DIAGNOSIS — I482 Chronic atrial fibrillation: Secondary | ICD-10-CM | POA: Diagnosis not present

## 2017-08-29 DIAGNOSIS — K228 Other specified diseases of esophagus: Secondary | ICD-10-CM | POA: Insufficient documentation

## 2017-08-29 DIAGNOSIS — R195 Other fecal abnormalities: Secondary | ICD-10-CM | POA: Diagnosis not present

## 2017-08-29 DIAGNOSIS — Z7901 Long term (current) use of anticoagulants: Secondary | ICD-10-CM | POA: Insufficient documentation

## 2017-08-29 DIAGNOSIS — R1310 Dysphagia, unspecified: Secondary | ICD-10-CM | POA: Insufficient documentation

## 2017-08-29 DIAGNOSIS — K219 Gastro-esophageal reflux disease without esophagitis: Secondary | ICD-10-CM | POA: Diagnosis not present

## 2017-08-29 DIAGNOSIS — E785 Hyperlipidemia, unspecified: Secondary | ICD-10-CM | POA: Diagnosis not present

## 2017-08-29 DIAGNOSIS — K5730 Diverticulosis of large intestine without perforation or abscess without bleeding: Secondary | ICD-10-CM | POA: Diagnosis not present

## 2017-08-29 DIAGNOSIS — K3189 Other diseases of stomach and duodenum: Secondary | ICD-10-CM | POA: Insufficient documentation

## 2017-08-29 DIAGNOSIS — D120 Benign neoplasm of cecum: Secondary | ICD-10-CM | POA: Diagnosis not present

## 2017-08-29 DIAGNOSIS — I509 Heart failure, unspecified: Secondary | ICD-10-CM | POA: Insufficient documentation

## 2017-08-29 DIAGNOSIS — Z8711 Personal history of peptic ulcer disease: Secondary | ICD-10-CM | POA: Insufficient documentation

## 2017-08-29 DIAGNOSIS — K449 Diaphragmatic hernia without obstruction or gangrene: Secondary | ICD-10-CM | POA: Diagnosis not present

## 2017-08-29 DIAGNOSIS — Z7951 Long term (current) use of inhaled steroids: Secondary | ICD-10-CM | POA: Diagnosis not present

## 2017-08-29 DIAGNOSIS — D509 Iron deficiency anemia, unspecified: Secondary | ICD-10-CM | POA: Insufficient documentation

## 2017-08-29 DIAGNOSIS — Z79899 Other long term (current) drug therapy: Secondary | ICD-10-CM | POA: Diagnosis not present

## 2017-08-29 DIAGNOSIS — I110 Hypertensive heart disease with heart failure: Secondary | ICD-10-CM | POA: Diagnosis not present

## 2017-08-29 HISTORY — PX: COLONOSCOPY WITH PROPOFOL: SHX5780

## 2017-08-29 HISTORY — PX: ESOPHAGOGASTRODUODENOSCOPY (EGD) WITH PROPOFOL: SHX5813

## 2017-08-29 SURGERY — COLONOSCOPY WITH PROPOFOL
Anesthesia: General

## 2017-08-29 MED ORDER — PHENYLEPHRINE HCL 10 MG/ML IJ SOLN
INTRAMUSCULAR | Status: DC | PRN
  Administered 2017-08-29 (×2): 200 ug via INTRAVENOUS

## 2017-08-29 MED ORDER — PROPOFOL 10 MG/ML IV BOLUS
INTRAVENOUS | Status: AC
  Filled 2017-08-29: qty 40

## 2017-08-29 MED ORDER — SODIUM CHLORIDE 0.9 % IV SOLN
INTRAVENOUS | Status: DC
  Administered 2017-08-29: 08:00:00 via INTRAVENOUS

## 2017-08-29 MED ORDER — PROPOFOL 10 MG/ML IV BOLUS
INTRAVENOUS | Status: DC | PRN
  Administered 2017-08-29: 40 mg via INTRAVENOUS
  Administered 2017-08-29: 60 mg via INTRAVENOUS

## 2017-08-29 MED ORDER — FENTANYL CITRATE (PF) 100 MCG/2ML IJ SOLN
INTRAMUSCULAR | Status: AC
  Filled 2017-08-29: qty 2

## 2017-08-29 MED ORDER — LIDOCAINE HCL (CARDIAC) PF 100 MG/5ML IV SOSY
PREFILLED_SYRINGE | INTRAVENOUS | Status: DC | PRN
  Administered 2017-08-29: 80 mg via INTRAVENOUS

## 2017-08-29 MED ORDER — PROPOFOL 500 MG/50ML IV EMUL
INTRAVENOUS | Status: DC | PRN
  Administered 2017-08-29: 50 ug/kg/min via INTRAVENOUS

## 2017-08-29 MED ORDER — FENTANYL CITRATE (PF) 100 MCG/2ML IJ SOLN
INTRAMUSCULAR | Status: DC | PRN
  Administered 2017-08-29: 25 ug via INTRAVENOUS
  Administered 2017-08-29: 50 ug via INTRAVENOUS
  Administered 2017-08-29: 25 ug via INTRAVENOUS

## 2017-08-29 NOTE — Transfer of Care (Signed)
Immediate Anesthesia Transfer of Care Note  Patient: Sierra Kaiser  Procedure(s) Performed: COLONOSCOPY WITH PROPOFOL (N/A ) ESOPHAGOGASTRODUODENOSCOPY (EGD) WITH PROPOFOL (N/A )  Patient Location: PACU  Anesthesia Type:General  Level of Consciousness: awake and sedated  Airway & Oxygen Therapy: Patient Spontanous Breathing and Patient connected to nasal cannula oxygen  Post-op Assessment: Report given to RN and Post -op Vital signs reviewed and stable  Post vital signs: Reviewed and stable  Last Vitals:  Vitals Value Taken Time  BP    Temp    Pulse 73 08/29/2017  8:41 AM  Resp 14 08/29/2017  8:41 AM  SpO2 97 % 08/29/2017  8:41 AM  Vitals shown include unvalidated device data.  Last Pain:  Vitals:   08/29/17 0726  TempSrc: Tympanic  PainSc: 0-No pain         Complications: No apparent anesthesia complications

## 2017-08-29 NOTE — Anesthesia Postprocedure Evaluation (Signed)
Anesthesia Post Note  Patient: Sierra Kaiser  Procedure(s) Performed: COLONOSCOPY WITH PROPOFOL (N/A ) ESOPHAGOGASTRODUODENOSCOPY (EGD) WITH PROPOFOL (N/A )  Patient location during evaluation: Endoscopy Anesthesia Type: General Level of consciousness: awake and alert Pain management: pain level controlled Vital Signs Assessment: post-procedure vital signs reviewed and stable Respiratory status: spontaneous breathing, nonlabored ventilation, respiratory function stable and patient connected to nasal cannula oxygen Cardiovascular status: blood pressure returned to baseline and stable Postop Assessment: no apparent nausea or vomiting Anesthetic complications: no     Last Vitals:  Vitals:   08/29/17 0840 08/29/17 0900  BP:  122/70  Pulse:    Resp:    Temp: (!) 36.1 C   SpO2:      Last Pain:  Vitals:   08/29/17 0840  TempSrc: Tympanic  PainSc:                  Martha Clan

## 2017-08-29 NOTE — Op Note (Signed)
[facility] Gastroenterology Patient Name: Sierra Kaiser Procedure Date: 08/29/2017 7:45 AM MRN: [id number] Account #: [id number] Date of Birth: 03-Feb-1934 Admit Type: Outpatient Age: 84 Room: [facility] ENDO ROOM 4 Gender: Female Note Status: Finalized Procedure:            Colonoscopy Indications:          Unexplained iron deficiency anemia, Heme positive stool Providers:            Benay Pike. Charleston Hankin MD, MD Medicines:            Propofol per Anesthesia Complications:        No immediate complications. Procedure:            Pre-Anesthesia Assessment:                       - The risks and benefits of the procedure and the                        sedation options and risks were discussed with the                        patient. All questions were answered and informed                        consent was obtained.                       - Patient identification and proposed procedure were                        verified prior to the procedure by the nurse. The                        procedure was verified in the procedure room.                       - ASA Grade Assessment: III - A patient with severe                        systemic disease.                       - After reviewing the risks and benefits, the patient                        was deemed in satisfactory condition to undergo the                        procedure.                       After obtaining informed consent, the colonoscope was                        passed under direct vision. Throughout the procedure,                        the patient's blood pressure, pulse, and oxygen                        saturations were monitored continuously. The  Colonoscope was introduced through the anus and                        advanced to the the cecum, identified by appendiceal                        orifice and ileocecal valve. The colonoscopy was                        performed without difficulty.  The patient tolerated the                        procedure well. The quality of the bowel preparation                        was good. The ileocecal valve, appendiceal orifice, and                        rectum were photographed. Findings:      The perianal and digital rectal examinations were normal. Pertinent       negatives include normal sphincter tone and no palpable rectal lesions.      Many small-mouthed diverticula were found in the sigmoid colon.      Two sessile polyps were found in the transverse colon and hepatic       flexure. The polyps were 3 to 5 mm in size. These polyps were removed       with a cold biopsy forceps. Resection and retrieval were complete.      A 6 mm polyp was found in the cecum. The polyp was sessile. The polyp       was removed with a cold snare. Resection and retrieval were complete.      The exam was otherwise without abnormality. Impression:           - Diverticulosis in the sigmoid colon.                       - Two 3 to 5 mm polyps in the transverse colon and at                        the hepatic flexure, removed with a cold biopsy                        forceps. Resected and retrieved.                       - One 6 mm polyp in the cecum, removed with a cold                        snare. Resected and retrieved.                       - The examination was otherwise normal. Recommendation:       - Patient has a contact number available for                        emergencies. The signs and symptoms of potential                        delayed  complications were discussed with the patient.                        Return to normal activities tomorrow. Written discharge                        instructions were provided to the patient.                       - Resume previous diet.                       - Await pathology results from EGD, also performed                        today.                       - NO repeat colonoscopy due to age/comorbid status                        - Consider other causes of iron deficiency. Monitor                       - The findings and recommendations were discussed with                        the patient and their family. Procedure Code(s):    --- Professional ---                       [phone], Colonoscopy, flexible; with removal of tumor(s),                        polyp(s), or other lesion(s) by snare technique                       [id number], 57, Colonoscopy, flexible; with biopsy, single                        or multiple Diagnosis Code(s):    --- Professional ---                       D12.3, Benign neoplasm of transverse colon (hepatic                        flexure or splenic flexure)                       D12.0, Benign neoplasm of cecum                       D50.9, Iron deficiency anemia, unspecified                       R19.5, Other fecal abnormalities                       K57.30, Diverticulosis of large intestine without                        perforation or abscess without bleeding CPT copyright 2017 American Medical Association. All rights reserved. The codes documented in this report  are preliminary and upon coder review may  be revised to meet current compliance requirements. Efrain Sella MD, MD 08/29/2017 8:44:39 AM This report has been signed electronically. Number of Addenda: 0 Note Initiated On: 08/29/2017 7:45 AM Scope Withdrawal Time: 0 hours 5 minutes 34 seconds  Total Procedure Duration: 0 hours 15 minutes 57 seconds       [facility]

## 2017-08-29 NOTE — Op Note (Signed)
[facility] Gastroenterology Patient Name: Sierra Kaiser Procedure Date: 08/29/2017 7:27 AM MRN: [id number] Account #: [id number] Date of Birth: 1933-07-12 Admit Type: Outpatient Age: 84 Room: [facility] ENDO ROOM 4 Gender: Female Note Status: Finalized Procedure:            Upper GI endoscopy Indications:          Suspected upper gastrointestinal bleeding in patient                        with unexplained iron deficiency anemia, Heme positive                        stool Providers:            Benay Pike. Calel Pisarski MD, MD Medicines:            Propofol per Anesthesia Complications:        No immediate complications. Procedure:            Pre-Anesthesia Assessment:                       - The risks and benefits of the procedure and the                        sedation options and risks were discussed with the                        patient. All questions were answered and informed                        consent was obtained.                       - Patient identification and proposed procedure were                        verified prior to the procedure by the nurse. The                        procedure was verified in the procedure room.                       - ASA Grade Assessment: III - A patient with severe                        systemic disease.                       - After reviewing the risks and benefits, the patient                        was deemed in satisfactory condition to undergo the                        procedure.                       After obtaining informed consent, the endoscope was                        passed under direct vision. Throughout the procedure,  the patient's blood pressure, pulse, and oxygen                        saturations were monitored continuously. The Endoscope                        was introduced through the mouth, and advanced to the                        third part of duodenum. The upper GI endoscopy was                         accomplished without difficulty. The patient tolerated                        the procedure well. Findings:      Moderate tortuosity of the mid to distal esophagus was noted compatible       with a diagnosis of Presbyesophagus.      A single 6 mm sessile polyp with no bleeding and no stigmata of recent       bleeding was found in the cardia. Biopsies were taken with a cold       forceps for histology.      A 1 cm hiatal hernia was present.      The examined duodenum was normal. Biopsies for histology were taken with       a cold forceps for evaluation of celiac disease.      The exam was otherwise without abnormality. Impression:           - A single gastric polyp. Biopsied.                       - 1 cm hiatal hernia.                       - Normal examined duodenum. Biopsied.                       - The examination was otherwise normal. Recommendation:       - Await pathology results.                       - Proceed with colonoscopy Procedure Code(s):    --- Professional ---                       [phone], Esophagogastroduodenoscopy, flexible, transoral;                        with biopsy, single or multiple Diagnosis Code(s):    --- Professional ---                       K31.7, Polyp of stomach and duodenum                       K44.9, Diaphragmatic hernia without obstruction or                        gangrene                       D50.9, Iron deficiency anemia, unspecified  R19.5, Other fecal abnormalities CPT copyright 2017 American Medical Association. All rights reserved. The codes documented in this report are preliminary and upon coder review may  be revised to meet current compliance requirements. Efrain Sella MD, MD 08/29/2017 8:18:49 AM This report has been signed electronically. Number of Addenda: 0 Note Initiated On: 08/29/2017 7:27 AM      [facility]

## 2017-08-29 NOTE — Interval H&P Note (Signed)
History and Physical Interval Note:  08/29/2017 8:06 AM  Sierra Kaiser  has presented today for surgery, with the diagnosis of PRS HX COLON POLYPS IDA  The various methods of treatment have been discussed with the patient and family. After consideration of risks, benefits and other options for treatment, the patient has consented to  Procedure(s): COLONOSCOPY WITH PROPOFOL (N/A) ESOPHAGOGASTRODUODENOSCOPY (EGD) WITH PROPOFOL (N/A) as a surgical intervention .  The patient's history has been reviewed, patient examined, no change in status, stable for surgery.  I have reviewed the patient's chart and labs.  Questions were answered to the patient's satisfaction.     [name], [name]

## 2017-08-29 NOTE — Anesthesia Post-op Follow-up Note (Signed)
Anesthesia QCDR form completed.        

## 2017-08-29 NOTE — H&P (Signed)
Outpatient short stay form Pre-procedure 08/29/2017 8:02 AM Sierra Kaiser K. Alice Reichert, M.D.  Primary Physician: [name], M.D.  Reason for visit:  Iron deficiency anemia, occult positive stool, Dysphagia, long term anticoagulant use, personal hx of colon polyps.  History of present illness:  84 y/o presents with iron deficiency anemia, personal hx of colon polyps, IDA. Patient's brother had colon cancer. Patient also has some nondescript dysphagia.    Current Facility-Administered Medications:  .  0.9 %  sodium chloride infusion, , Intravenous, Continuous, [place], Benay Pike, MD, Last Rate: 20 mL/hr at 08/29/17 0741  Medications Prior to Admission  Medication Sig Dispense Refill Last Dose  . Calcium Carb-Cholecalciferol (CALCIUM-VITAMIN D) 600-400 MG-UNIT TABS Take 1 tablet by mouth daily.   Past Week at Unknown time  . diphenhydrAMINE-zinc acetate (BENADRYL) cream Apply 1 application topically 2 (two) times daily as needed for itching.   Past Month at Unknown time  . Ferrous Sulfate (IRON) 325 (65 Fe) MG TABS Take 1 tablet by mouth daily.   Past Week at Unknown time  . FIBER PO Take 1 tablet by mouth daily. 325 mg   Past Week at Unknown time  . fluticasone (FLONASE) 50 MCG/ACT nasal spray Place 1 spray into both nostrils daily.    08/29/2017 at 0600  . fluticasone (FLONASE) 50 MCG/ACT nasal spray SPRAY 2 SPRAYS INTO EACH NOSTRIL EVERY DAY 16 g 3 08/29/2017 at 0600  . furosemide (LASIX) 40 MG tablet TAKE 1 TABLET DAILY 90 tablet 0 Past Week at Unknown time  . omeprazole (PRILOSEC) 40 MG capsule TAKE 1 CAPSULE DAILY 90 capsule 2 Past Week at Unknown time  . rosuvastatin (CRESTOR) 20 MG tablet TAKE 1 TABLET DAILY 30 tablet 0 Past Week at Unknown time  . spironolactone (ALDACTONE) 25 MG tablet Take 25 mg by mouth daily.   Past Week at Unknown time  . vitamin B-12 (CYANOCOBALAMIN) 1000 MCG tablet Take 1,000 mcg by mouth daily.   Past Week at Unknown time  . vitamin E 400 UNIT capsule Take 400  Units by mouth daily.   Past Week at Unknown time  . XARELTO 15 MG TABS tablet TAKE 1 TABLET DAILY WITH SUPPER 90 tablet 3 08/25/2017 at Unknown time  . Polyethyl Glycol-Propyl Glycol (SYSTANE OP) Place 1 drop into both eyes daily as needed (dry eyes).   Completed Course at Unknown time     Allergies  Allergen Reactions  . Gadolinium Derivatives Itching and Rash    Persisted for months  . Contrast Media [Iodinated Diagnostic Agents] Hives       . Metronidazole Itching and Other (See Comments)    Hands really red  . Diltiazem Hcl Itching and Rash     Past Medical History:  Diagnosis Date  . Anxiety   . CAD (coronary artery disease)   . CHF (congestive heart failure) ([place])   . Esophageal reflux   . GI bleeding 2012   during EGD necessitating open surgery  . Hiatal hernia   . History of blood transfusion   . History of diverticulitis   . History of UTI   . HTN (hypertension)   . Hyperlipidemia   . Meniere's disease 1970s  . Mitral regurgitation   . Osteopenia 12/11/2015   T score -1.2 femur, -2.0 spine (12/2015)  . Permanent atrial fibrillation ([place])    a. permanent b. s/p PVI RFA at [facility] 10/14 c. failed amio (neuro toxicity) and Tikosyn d. 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  PUD (peptic ulcer disease)   . Raynaud's disease   . Raynaud's disease   . SNHL (sensorineural hearing loss)    right ear  . Syncopal episodes   . Tinnitus   . Tinnitus   . Ulcerative colitis ([place])    per prior records  . Urinary incontinence, urge     Review of systems:  Otherwise negative.    Physical Exam  Gen: Alert, oriented. Appears stated age.  HEENT: [place]/AT. PERRLA. Lungs: CTA, no wheezes. CV: RR nl S1, S2. Abd: soft, benign, no masses. BS+ Ext: No edema. Pulses 2+    Planned procedures: Proceed with EGD and colonoscopy. The patient understands the nature of the planned procedure, indications, risks, alternatives and potential  complications including but not limited to bleeding, infection, perforation, damage to internal organs and possible oversedation/side effects from anesthesia. The patient agrees and gives consent to proceed.  Please refer to procedure notes for findings, recommendations and patient disposition/instructions.     Janese Radabaugh K. Alice Reichert, M.D. Gastroenterology 08/29/2017  8:02 AM

## 2017-08-29 NOTE — Anesthesia Preprocedure Evaluation (Addendum)
Anesthesia Evaluation  Patient identified by MRN, date of birth, ID band Patient awake    Reviewed: Allergy & Precautions, H&P , NPO status , Patient's Chart, lab work & pertinent test results, reviewed documented beta blocker date and time   History of Anesthesia Complications Negative for: history of anesthetic complications  Airway Mallampati: I  TM Distance: >3 FB Neck ROM: full    Dental  (+) Dental Advidsory Given, Caps, Teeth Intact   Pulmonary shortness of breath (for 10 years, unknown cause), neg COPD, neg recent URI,           Cardiovascular Exercise Tolerance: Good hypertension, + CAD and +CHF  (-) Past MI, (-) Cardiac Stents and (-) CABG + dysrhythmias Atrial Fibrillation + pacemaker + Valvular Problems/Murmurs      Neuro/Psych PSYCHIATRIC DISORDERS Anxiety negative neurological ROS     GI/Hepatic Neg liver ROS, hiatal hernia, PUD, GERD  ,  Endo/Other  negative endocrine ROS  Renal/GU CRFRenal disease  negative genitourinary   Musculoskeletal   Abdominal   Peds  Hematology negative hematology ROS (+)   Anesthesia Other Findings Past Medical History: No date: Anxiety No date: CAD (coronary artery disease) No date: CHF (congestive heart failure) ([facility]) No date: Esophageal reflux 2012: GI bleeding     Comment:  during EGD necessitating open surgery No date: Hiatal hernia No date: History of blood transfusion No date: History of diverticulitis No date: History of UTI No date: HTN (hypertension) No date: Hyperlipidemia 1970s: Meniere's disease No date: Mitral regurgitation 12/11/2015: Osteopenia     Comment:  T score -1.2 femur, -2.0 spine (12/2015) No date: Permanent atrial fibrillation ([facility])     Comment:  a. permanent b. s/p PVI RFA at [facility] 10/14 c. failed amio              (neuro toxicity) and Tikosyn d. single chamber STJ PPM               implanted 04/2014 in anticipation of AVN ablation  No  date: PUD (peptic ulcer disease) No date: Raynaud's disease No date: Raynaud's disease No date: SNHL (sensorineural hearing loss)     Comment:  right ear No date: Syncopal episodes No date: Tinnitus No date: Tinnitus No date: Ulcerative colitis ([place])     Comment:  per prior records No date: Urinary incontinence, urge   Reproductive/Obstetrics negative OB ROS                            Anesthesia Physical Anesthesia Plan  ASA: III  Anesthesia Plan: General   Post-op Pain Management:    Induction: Intravenous  PONV Risk Score and Plan: 3 and TIVA and Propofol infusion  Airway Management Planned: Nasal Cannula  Additional Equipment:   Intra-op Plan:   Post-operative Plan:   Informed Consent: I have reviewed the patients History and Physical, chart, labs and discussed the procedure including the risks, benefits and alternatives for the proposed anesthesia with the patient or authorized representative who has indicated his/her understanding and acceptance.   Dental Advisory Given  Plan Discussed with: Anesthesiologist, CRNA and Surgeon  Anesthesia Plan Comments:         Anesthesia Quick Evaluation

## 2017-08-30 LAB — SURGICAL PATHOLOGY

## 2017-08-31 ENCOUNTER — Encounter: Payer: Self-pay | Admitting: Internal Medicine

## 2017-09-02 ENCOUNTER — Encounter: Payer: Self-pay | Admitting: Family Medicine

## 2017-09-05 ENCOUNTER — Other Ambulatory Visit (INDEPENDENT_AMBULATORY_CARE_PROVIDER_SITE_OTHER): Payer: Medicare Other

## 2017-09-05 ENCOUNTER — Telehealth: Payer: Self-pay | Admitting: Family Medicine

## 2017-09-05 DIAGNOSIS — N183 Chronic kidney disease, stage 3 unspecified: Secondary | ICD-10-CM

## 2017-09-05 DIAGNOSIS — D509 Iron deficiency anemia, unspecified: Secondary | ICD-10-CM

## 2017-09-05 LAB — RENAL FUNCTION PANEL
Albumin: 4.3 g/dL (ref 3.5–5.2)
BUN: 25 mg/dL — ABNORMAL HIGH (ref 6–23)
CO2: 25 mEq/L (ref 19–32)
Calcium: 9.7 mg/dL (ref 8.4–10.5)
Chloride: 109 mEq/L (ref 96–112)
Creatinine, Ser: 1.01 mg/dL (ref 0.40–1.20)
GFR: 55.72 mL/min — ABNORMAL LOW (ref >60.00)
Glucose, Bld: 82 mg/dL (ref 70–99)
Phosphorus: 3.4 mg/dL (ref 2.3–4.6)
Potassium: 4.8 mEq/L (ref 3.5–5.1)
Sodium: 142 mEq/L (ref 135–145)

## 2017-09-05 LAB — CBC WITH DIFFERENTIAL/PLATELET
Basophils Absolute: 0 10*3/uL (ref 0.0–0.1)
Basophils Relative: 0.6 % (ref 0.0–3.0)
Eosinophils Absolute: 0.1 10*3/uL (ref 0.0–0.7)
Eosinophils Relative: 1.2 % (ref 0.0–5.0)
HCT: 40.2 % (ref 36.0–46.0)
Hemoglobin: 13.9 g/dL (ref 12.0–15.0)
Lymphocytes Relative: 22 % (ref 12.0–46.0)
Lymphs Abs: 1.5 10*3/uL (ref 0.7–4.0)
MCHC: 34.5 g/dL (ref 30.0–36.0)
MCV: 91.1 fl (ref 78.0–100.0)
Monocytes Absolute: 0.5 10*3/uL (ref 0.1–1.0)
Monocytes Relative: 7.9 % (ref 3.0–12.0)
Neutro Abs: 4.6 10*3/uL (ref 1.4–7.7)
Neutrophils Relative %: 68.3 % (ref 43.0–77.0)
Platelets: 163 10*3/uL (ref 150.0–400.0)
RBC: 4.41 Mil/uL (ref 3.87–5.11)
RDW: 19.2 % — ABNORMAL HIGH (ref 11.5–15.5)
WBC: 6.7 10*3/uL (ref 4.0–10.5)

## 2017-09-05 LAB — IBC PANEL
Iron: 174 ug/dL — ABNORMAL HIGH (ref 42–145)
Saturation Ratios: 64.1 % — ABNORMAL HIGH (ref 20.0–50.0)
Transferrin: 194 mg/dL — ABNORMAL LOW (ref 212.0–360.0)

## 2017-09-05 LAB — FERRITIN: Ferritin: 175.4 ng/mL (ref 10.0–291.0)

## 2017-09-05 LAB — VITAMIN B12: Vitamin B-12: 356 pg/mL (ref 211–911)

## 2017-09-05 NOTE — Telephone Encounter (Signed)
Opened in error

## 2017-09-06 LAB — CUP PACEART REMOTE DEVICE CHECK
Battery Remaining Longevity: 139 mo
Battery Remaining Percentage: 95.5 %
Battery Voltage: 2.99 V
Brady Statistic RV Percent Paced: 98 %
Date Time Interrogation Session: 20190612060013
Implantable Lead Implant Date: 20160217
Implantable Lead Location: 753860
Implantable Lead Model: 1948
Implantable Pulse Generator Implant Date: 20160217
Lead Channel Impedance Value: 600 Ohm
Lead Channel Pacing Threshold Amplitude: 0.625 V
Lead Channel Pacing Threshold Pulse Width: 0.4 ms
Lead Channel Sensing Intrinsic Amplitude: 11.2 mV
Lead Channel Setting Pacing Amplitude: 0.875
Lead Channel Setting Pacing Pulse Width: 0.4 ms
Lead Channel Setting Sensing Sensitivity: 2.5 mV
Pulse Gen Model: 2240
Pulse Gen Serial Number: 3050195

## 2017-09-12 ENCOUNTER — Telehealth: Payer: Self-pay | Admitting: Family Medicine

## 2017-09-12 NOTE — Telephone Encounter (Signed)
Released via mychart. plz notify: labs returned showing improved kidney function, normal blood counts, normal iron stores and stable vitamin b12 level. Iron levels themselves were high - likely due to 2 iron infusions. Will recommend we recheck iron levels with next labs. a How are is she feeling?

## 2017-09-12 NOTE — Telephone Encounter (Signed)
Copied from [place] [id number]. Topic: Quick Communication - See Telephone Encounter >> Sep 12, 2017  2:48 PM Gardiner Ramus wrote: CRM for notification. See Telephone encounter for: 09/12/17. Pt would like lab results from 09/05/17. Please advise Cb#[phone]

## 2017-09-12 NOTE — Telephone Encounter (Signed)
Spoke with pt's husband, Sierra Kaiser (on dpr), relaying lab results and message per Dr. Darnell Level.  He states pt is feeling real good.  She request her lab results be mailed to her.  Mailed results to pt.

## 2017-09-15 ENCOUNTER — Encounter: Payer: Self-pay | Admitting: Family Medicine

## 2017-09-30 ENCOUNTER — Encounter: Payer: Self-pay | Admitting: Family Medicine

## 2017-11-03 ENCOUNTER — Other Ambulatory Visit: Payer: Self-pay | Admitting: Internal Medicine

## 2017-11-14 ENCOUNTER — Ambulatory Visit (INDEPENDENT_AMBULATORY_CARE_PROVIDER_SITE_OTHER): Payer: Medicare Other | Admitting: *Deleted

## 2017-11-14 DIAGNOSIS — I442 Atrioventricular block, complete: Secondary | ICD-10-CM

## 2017-11-14 NOTE — Progress Notes (Signed)
Remote pacemaker transmission.   

## 2017-11-19 ENCOUNTER — Ambulatory Visit: Payer: Self-pay | Admitting: *Deleted

## 2017-11-19 DIAGNOSIS — E538 Deficiency of other specified B group vitamins: Secondary | ICD-10-CM

## 2017-11-19 DIAGNOSIS — D509 Iron deficiency anemia, unspecified: Secondary | ICD-10-CM

## 2017-11-19 NOTE — Telephone Encounter (Signed)
Sierra Kaiser phones with increased SOB along with feeling drunk-like over the last 2 weeks. It is with exertion and resting she reports. She stated she has felt really good since her last iron infusion on 07/31/17 until just recently. Reports she has a long history of SOB and feels Dr. Darnell Level. May want to order labs. Denies wheezing/fever/cp/cough.  She did not want triage nor an appointment at this time.  Please advise.  Reason for Disposition . [1] Longstanding difficulty breathing (e.g., CHF, COPD, emphysema) AND [2] WORSE than normal  Answer Assessment - Initial Assessment Questions 1. RESPIRATORY STATUS: "Describe your breathing?" (e.g., wheezing, shortness of breath, unable to speak, severe coughing)      Short of breath on exertion and resting.  2. ONSET: "When did this breathing problem begin?"     About 2 weeks ago. 3. PATTERN "Does the difficult breathing come and go, or has it been constant since it started?"      constant 4. SEVERITY: "How bad is your breathing?" (e.g., mild, moderate, severe)    - MILD: No SOB at rest, mild SOB with walking, speaks normally in sentences, can lay down, no retractions, pulse < 100.    - MODERATE: SOB at rest, SOB with minimal exertion and prefers to sit, cannot lie down flat, speaks in phrases, mild retractions, audible wheezing, pulse 100-120.    - SEVERE: Very SOB at rest, speaks in single words, struggling to breathe, sitting hunched forward, retractions, pulse > 120      Mild but has worsened over the last 2 weeks.  5. RECURRENT SYMPTOM: "Have you had difficulty breathing before?" If so, ask: "When was the last time?" and "What happened that time?"      Yes, she has been battling low iron levels and SOB over the last 10 years. 6. CARDIAC HISTORY: "Do you have any history of heart disease?" (e.g., heart attack, angina, bypass surgery, angioplasty)      pacemaker 7. LUNG HISTORY: "Do you have any history of lung disease?"  (e.g., pulmonary embolus, asthma,  emphysema)     no 8. CAUSE: "What do you think is causing the breathing problem?"      unsure 9. OTHER SYMPTOMS: "Do you have any other symptoms? (e.g., dizziness, runny nose, cough, chest pain, fever)     Drunk and wobbly-feeling at times. 10. PREGNANCY: "Is there any chance you are pregnant?" "When was your last menstrual period?"       no 11. TRAVEL: "Have you traveled out of the country in the last month?" (e.g., travel history, exposures)       no  Protocols used: BREATHING DIFFICULTY-A-AH

## 2017-11-20 NOTE — Telephone Encounter (Signed)
Attempted to contact pt. No answer. No vm.  Need to relay Dr. Synthia Innocent message and schedule for lab visit.

## 2017-11-20 NOTE — Telephone Encounter (Signed)
Yes please come in for labs.

## 2017-11-20 NOTE — Telephone Encounter (Signed)
Looks like pt is scheduled on 11/21/17 at 10:15 AM for labs.

## 2017-11-20 NOTE — Addendum Note (Signed)
Addended by: [name] on: 11/20/2017 10:28 AM   Modules accepted: Orders

## 2017-11-21 ENCOUNTER — Other Ambulatory Visit (INDEPENDENT_AMBULATORY_CARE_PROVIDER_SITE_OTHER): Payer: Medicare Other

## 2017-11-21 DIAGNOSIS — D509 Iron deficiency anemia, unspecified: Secondary | ICD-10-CM | POA: Diagnosis not present

## 2017-11-21 DIAGNOSIS — E538 Deficiency of other specified B group vitamins: Secondary | ICD-10-CM

## 2017-11-21 LAB — CBC WITH DIFFERENTIAL/PLATELET
Basophils Absolute: 0.1 10*3/uL (ref 0.0–0.1)
Basophils Relative: 1.5 % (ref 0.0–3.0)
Eosinophils Absolute: 0.2 10*3/uL (ref 0.0–0.7)
Eosinophils Relative: 2.6 % (ref 0.0–5.0)
HCT: 42.2 % (ref 36.0–46.0)
Hemoglobin: 14.8 g/dL (ref 12.0–15.0)
Lymphocytes Relative: 32.8 % (ref 12.0–46.0)
Lymphs Abs: 2 10*3/uL (ref 0.7–4.0)
MCHC: 34.9 g/dL (ref 30.0–36.0)
MCV: 97.5 fl (ref 78.0–100.0)
Monocytes Absolute: 0.5 10*3/uL (ref 0.1–1.0)
Monocytes Relative: 7.4 % (ref 3.0–12.0)
Neutro Abs: 3.4 10*3/uL (ref 1.4–7.7)
Neutrophils Relative %: 55.7 % (ref 43.0–77.0)
Platelets: 187 10*3/uL (ref 150.0–400.0)
RBC: 4.33 Mil/uL (ref 3.87–5.11)
RDW: 14 % (ref 11.5–15.5)
WBC: 6.1 10*3/uL (ref 4.0–10.5)

## 2017-11-21 LAB — IBC PANEL
Iron: 106 ug/dL (ref 42–145)
Saturation Ratios: 38.2 % (ref 20.0–50.0)
Transferrin: 198 mg/dL — ABNORMAL LOW (ref 212.0–360.0)

## 2017-11-21 LAB — VITAMIN B12: Vitamin B-12: 339 pg/mL (ref 211–911)

## 2017-11-21 LAB — FERRITIN: Ferritin: 131.5 ng/mL (ref 10.0–291.0)

## 2017-11-23 ENCOUNTER — Telehealth: Payer: Self-pay | Admitting: Family Medicine

## 2017-11-23 NOTE — Telephone Encounter (Signed)
Copied from [place] [id number]. Topic: General - Other >> Nov 23, 2017  1:25 PM Yvette Rack wrote: Reason for CRM: patient calling for lab results she would like for them to mail to her and call her pt doesn't use my chart

## 2017-11-23 NOTE — Telephone Encounter (Signed)
Results mailed to patient;.

## 2017-11-27 ENCOUNTER — Ambulatory Visit (INDEPENDENT_AMBULATORY_CARE_PROVIDER_SITE_OTHER): Payer: Medicare Other | Admitting: Family Medicine

## 2017-11-27 ENCOUNTER — Encounter: Payer: Self-pay | Admitting: Family Medicine

## 2017-11-27 VITALS — BP 122/70 | HR 72 | Temp 97.9°F | Ht 68.0 in | Wt 133.0 lb

## 2017-11-27 DIAGNOSIS — R5382 Chronic fatigue, unspecified: Secondary | ICD-10-CM | POA: Diagnosis not present

## 2017-11-27 DIAGNOSIS — Z23 Encounter for immunization: Secondary | ICD-10-CM

## 2017-11-27 DIAGNOSIS — E611 Iron deficiency: Secondary | ICD-10-CM | POA: Diagnosis not present

## 2017-11-27 DIAGNOSIS — E538 Deficiency of other specified B group vitamins: Secondary | ICD-10-CM | POA: Insufficient documentation

## 2017-11-27 MED ORDER — IRON 325 (65 FE) MG PO TABS: 1.0000 | ORAL_TABLET | ORAL | Status: DC

## 2017-11-27 MED ORDER — CYANOCOBALAMIN 1000 MCG/ML IJ SOLN
1000.0000 ug | Freq: Once | INTRAMUSCULAR | Status: AC
  Administered 2017-11-27: 1000 ug via INTRAMUSCULAR

## 2017-11-27 NOTE — Patient Instructions (Addendum)
Flu shot today b12 shot today and again in 2 weeks (nurse visit) then start monthly shots for 6 months. Continue oral b12 tablet as well.  Start iron tablet every other day.  Return in 1 month for follow up visit.

## 2017-11-27 NOTE — Progress Notes (Signed)
BP 122/70 (BP Location: Left Arm, Patient Position: Sitting, Cuff Size: Normal)   Pulse 72   Temp 97.9 F (36.6 C) (Oral)   Ht 5\' 8"  (1.727 m)   Wt 133 lb (60.3 kg)   SpO2 97%   BMI 20.22 kg/m    CC: discuss vit B12 levels "I feel terrible" Subjective:    Patient ID: Sierra Kaiser, female    DOB: 1933-07-24, 84 y.o.   MRN: [id number]  HPI: Sierra Kaiser is a 84 y.o. female presenting on 11/27/2017 for Discuss Vit B12 (Wants to discuss vit b12 deficiency.)   4-5 wk h/o return of fatigue, dyspnea, paresthesias of fingers, no energy. She felt like this prior to iron infusion as well - but had significant improvement after iron infusion. She received iron infusion 07/2017 x2. She has not been taking iron tablets.   B12 level are low normal - she has been taking vitamin B12 since about 2012. She previously received B12 shots remotely.   Relevant past medical, surgical, family and social history reviewed and updated as indicated. Interim medical history since our last visit reviewed. Allergies and medications reviewed and updated. Outpatient Medications Prior to Visit  Medication Sig Dispense Refill  . diphenhydrAMINE-zinc acetate (BENADRYL) cream Apply 1 application topically 2 (two) times daily as needed for itching.    Marland Kitchen FIBER PO Take 1 tablet by mouth daily. 325 mg    . fluticasone (FLONASE) 50 MCG/ACT nasal spray SPRAY 2 SPRAYS INTO EACH NOSTRIL EVERY DAY 16 g 3  . furosemide (LASIX) 40 MG tablet TAKE 1 TABLET DAILY 90 tablet 2  . omeprazole (PRILOSEC) 40 MG capsule TAKE 1 CAPSULE DAILY 90 capsule 2  . Polyethyl Glycol-Propyl Glycol (SYSTANE OP) Place 1 drop into both eyes daily as needed (dry eyes).    . rosuvastatin (CRESTOR) 20 MG tablet TAKE 1 TABLET DAILY 30 tablet 0  . spironolactone (ALDACTONE) 25 MG tablet Take 25 mg by mouth daily.    . vitamin B-12 (CYANOCOBALAMIN) 1000 MCG tablet Take 1,000 mcg by mouth daily.    Alveda Reasons 15 MG TABS tablet TAKE 1 TABLET DAILY WITH  SUPPER 90 tablet 3  . cyanocobalamin (,VITAMIN B-12,) 1000 MCG/ML injection Inject 1 mL (1,000 mcg total) into the muscle every 30 (thirty) days.    . fluticasone (FLONASE) 50 MCG/ACT nasal spray Place 1 spray into both nostrils daily.     . Calcium Carb-Cholecalciferol (CALCIUM-VITAMIN D) 600-400 MG-UNIT TABS Take 1 tablet by mouth daily.    . Ferrous Sulfate (IRON) 325 (65 Fe) MG TABS Take 1 tablet by mouth daily.    . vitamin E 400 UNIT capsule Take 400 Units by mouth daily.     No facility-administered medications prior to visit.      Per HPI unless specifically indicated in ROS section below Review of Systems     Objective:    BP 122/70 (BP Location: Left Arm, Patient Position: Sitting, Cuff Size: Normal)   Pulse 72   Temp 97.9 F (36.6 C) (Oral)   Ht 5\' 8"  (1.727 m)   Wt 133 lb (60.3 kg)   SpO2 97%   BMI 20.22 kg/m   Wt Readings from Last 3 Encounters:  11/27/17 133 lb (60.3 kg)  08/29/17 139 lb (63 kg)  08/15/17 139 lb 8 oz (63.3 kg)    Physical Exam  Constitutional: She appears well-developed and well-nourished. No distress.  Psychiatric: She has a normal mood and affect.  Nursing note and vitals  reviewed.  Results for orders placed or performed in visit on 11/21/17  Vitamin B12  Result Value Ref Range   Vitamin B-12 339 211 - 911 pg/mL  CBC with Differential/Platelet  Result Value Ref Range   WBC 6.1 4.0 - 10.5 K/uL   RBC 4.33 3.87 - 5.11 Mil/uL   Hemoglobin 14.8 12.0 - 15.0 g/dL   HCT 42.2 36.0 - 46.0 %   MCV 97.5 78.0 - 100.0 fl   MCHC 34.9 30.0 - 36.0 g/dL   RDW 14.0 11.5 - 15.5 %   Platelets 187.0 150.0 - 400.0 K/uL   Neutrophils Relative % 55.7 43.0 - 77.0 %   Lymphocytes Relative 32.8 12.0 - 46.0 %   Monocytes Relative 7.4 3.0 - 12.0 %   Eosinophils Relative 2.6 0.0 - 5.0 %   Basophils Relative 1.5 0.0 - 3.0 %   Neutro Abs 3.4 1.4 - 7.7 K/uL   Lymphs Abs 2.0 0.7 - 4.0 K/uL   Monocytes Absolute 0.5 0.1 - 1.0 K/uL   Eosinophils Absolute 0.2 0.0 -  0.7 K/uL   Basophils Absolute 0.1 0.0 - 0.1 K/uL  IBC panel  Result Value Ref Range   Iron 106 42 - 145 ug/dL   Transferrin 198.0 (L) 212.0 - 360.0 mg/dL   Saturation Ratios 38.2 20.0 - 50.0 %  Ferritin  Result Value Ref Range   Ferritin 131.5 10.0 - 291.0 ng/mL      Assessment & Plan:   Problem List Items Addressed This Visit    Low serum vitamin B12 - Primary    b12 remains low 300s despite compliance with daily b12 1042mcg over years. Will try b12 shots - Q2 wks x 1 month then monthly x 6 months. I will ask her to return in 1 mo f/u visit.  Check IF next labwork.       Relevant Medications   cyanocobalamin ((VITAMIN B-12)) injection 1,000 mcg (Completed)   Iron deficiency    Anemia has resolved with iron infusion. Iron panel recently stable. No need for further iron infusion, but I did ask her to start oral iron tablets QOD OTC. Reassess at f/u visit.       Chronic fatigue    This has returned - iron panel was normal. See below.        Other Visit Diagnoses    Need for influenza vaccination       Relevant Orders   Flu Vaccine QUAD 36+ mos IM (Completed)       Meds ordered this encounter  Medications  . Ferrous Sulfate (IRON) 325 (65 Fe) MG TABS    Sig: Take 1 tablet (325 mg total) by mouth every other day.  . cyanocobalamin ((VITAMIN B-12)) injection 1,000 mcg   Orders Placed This Encounter  Procedures  . Flu Vaccine QUAD 36+ mos IM    Follow up plan: Return in about 4 weeks (around 12/25/2017) for follow up visit.  [name], MD

## 2017-11-27 NOTE — Assessment & Plan Note (Signed)
Anemia has resolved with iron infusion. Iron panel recently stable. No need for further iron infusion, but I did ask her to start oral iron tablets QOD OTC. Reassess at f/u visit.

## 2017-11-27 NOTE — Assessment & Plan Note (Addendum)
b12 remains low 300s despite compliance with daily b12 1046mcg over years. Will try b12 shots - Q2 wks x 1 month then monthly x 6 months. I will ask her to return in 1 mo f/u visit.  Check IF next labwork.

## 2017-11-27 NOTE — Assessment & Plan Note (Signed)
This has returned - iron panel was normal. See below.

## 2017-12-05 LAB — CUP PACEART REMOTE DEVICE CHECK
Battery Remaining Longevity: 139 mo
Battery Remaining Percentage: 95.5 %
Battery Voltage: 2.99 V
Brady Statistic RV Percent Paced: 98 %
Date Time Interrogation Session: 20190911061300
Implantable Lead Implant Date: 20160217
Implantable Lead Location: 753860
Implantable Lead Model: 1948
Implantable Pulse Generator Implant Date: 20160217
Lead Channel Impedance Value: 600 Ohm
Lead Channel Pacing Threshold Amplitude: 0.625 V
Lead Channel Pacing Threshold Pulse Width: 0.4 ms
Lead Channel Sensing Intrinsic Amplitude: 12 mV
Lead Channel Setting Pacing Amplitude: 0.875
Lead Channel Setting Pacing Pulse Width: 0.4 ms
Lead Channel Setting Sensing Sensitivity: 2.5 mV
Pulse Gen Model: 2240
Pulse Gen Serial Number: 3050195

## 2017-12-10 ENCOUNTER — Ambulatory Visit (INDEPENDENT_AMBULATORY_CARE_PROVIDER_SITE_OTHER): Payer: Medicare Other | Admitting: Family Medicine

## 2017-12-10 ENCOUNTER — Encounter: Payer: Self-pay | Admitting: Family Medicine

## 2017-12-10 ENCOUNTER — Telehealth: Payer: Self-pay | Admitting: Family Medicine

## 2017-12-10 ENCOUNTER — Ambulatory Visit: Payer: Self-pay | Admitting: Family Medicine

## 2017-12-10 VITALS — BP 120/74 | HR 75 | Temp 97.8°F | Ht 68.0 in | Wt 133.0 lb

## 2017-12-10 DIAGNOSIS — Z639 Problem related to primary support group, unspecified: Secondary | ICD-10-CM | POA: Diagnosis not present

## 2017-12-10 DIAGNOSIS — R0602 Shortness of breath: Secondary | ICD-10-CM | POA: Diagnosis not present

## 2017-12-10 DIAGNOSIS — E538 Deficiency of other specified B group vitamins: Secondary | ICD-10-CM

## 2017-12-10 DIAGNOSIS — E611 Iron deficiency: Secondary | ICD-10-CM

## 2017-12-10 MED ORDER — SERTRALINE HCL 25 MG PO TABS: 25.0000 mg | ORAL_TABLET | Freq: Every day | ORAL | 3 refills | Status: DC

## 2017-12-10 MED ORDER — CYANOCOBALAMIN 1000 MCG/ML IJ SOLN
1000.0000 ug | Freq: Once | INTRAMUSCULAR | Status: AC
  Administered 2017-12-10: 1000 ug via INTRAMUSCULAR

## 2017-12-10 MED ORDER — HYDROXYZINE HCL 10 MG PO TABS: 10.0000 mg | ORAL_TABLET | Freq: Two times a day (BID) | ORAL | 0 refills | Status: DC | PRN

## 2017-12-10 NOTE — Assessment & Plan Note (Signed)
Continue b12 shot Q2 wks for 1 month then start monthly at next shot. RTC 2 wks f/u visit.

## 2017-12-10 NOTE — Telephone Encounter (Signed)
I returned her call regarding her lab results.  She was questioning what they meant.  She then mentioned she is feeling dizzy, short of breath, nervous and experiencing low energy.   She felt this way before and was given 2 iron infusions which made her feel so much better.  I made her an appt with Dr. Danise Mina for today at 4:00 to discuss her labs and her concerns.

## 2017-12-10 NOTE — Progress Notes (Signed)
BP 120/74 (BP Location: Left Arm, Patient Position: Sitting, Cuff Size: Normal)   Pulse 75   Temp 97.8 F (36.6 C) (Oral)   Ht 5\' 8"  (1.727 m)   Wt 133 lb (60.3 kg)   SpO2 97%   BMI 20.22 kg/m   Orthostatic VS for the past 24 hrs (Last 3 readings):  BP- Lying BP- Standing at 0 minutes  12/10/17 1628 - 134/84  12/10/17 1626 140/82 -    CC: dizziness, fatigue, malaise Subjective:    Patient ID: Sierra Kaiser, female    DOB: [birth year], 84 y.o.   MRN: [id number]  HPI: Sierra Kaiser is a 84 y.o. female presenting on 12/10/2017 for Dizziness (C/o dizziness that started a little over 1 wk ago. Also, has had some SOB and numbness in fingers. Says she is worse than before last iron infusion. Says she felt better after iron infusion. Wants to discuss. )   See prior note for details.  High stress recently. SIL recently passed away. Husband going blind (macular degeneration). She needs to be his primary caregiver.  Return of fatigue, dyspnea, as well as dizziness and paresthesias of hands.  She has previously had thorough pulmonary, cardiac and even neurological evaluation for chronic dyspnea.  Received iron infusion x2 07/2017 with significant improvement in symptoms. She has been taking iron tablets QOD for last 2 weeks.  B12 shot done last week - will repeat today.   Relevant past medical, surgical, family and social history reviewed and updated as indicated. Interim medical history since our last visit reviewed. Allergies and medications reviewed and updated. Outpatient Medications Prior to Visit  Medication Sig Dispense Refill  . cyanocobalamin (,VITAMIN B-12,) 1000 MCG/ML injection Inject 1 mL (1,000 mcg total) into the muscle every 30 (thirty) days.    . diphenhydrAMINE-zinc acetate (BENADRYL) cream Apply 1 application topically 2 (two) times daily as needed for itching.    . Ferrous Sulfate (IRON) 325 (65 Fe) MG TABS Take 1 tablet (325 mg total) by mouth every other day.    Marland Kitchen  FIBER PO Take 1 tablet by mouth daily. 325 mg    . fluticasone (FLONASE) 50 MCG/ACT nasal spray Place 1 spray into both nostrils daily.     . fluticasone (FLONASE) 50 MCG/ACT nasal spray SPRAY 2 SPRAYS INTO EACH NOSTRIL EVERY DAY 16 g 3  . furosemide (LASIX) 40 MG tablet TAKE 1 TABLET DAILY 90 tablet 2  . omeprazole (PRILOSEC) 40 MG capsule TAKE 1 CAPSULE DAILY 90 capsule 2  . Polyethyl Glycol-Propyl Glycol (SYSTANE OP) Place 1 drop into both eyes daily as needed (dry eyes).    . rosuvastatin (CRESTOR) 20 MG tablet TAKE 1 TABLET DAILY 30 tablet 0  . spironolactone (ALDACTONE) 25 MG tablet Take 25 mg by mouth daily.    . vitamin B-12 (CYANOCOBALAMIN) 1000 MCG tablet Take 1,000 mcg by mouth daily.    Alveda Reasons 15 MG TABS tablet TAKE 1 TABLET DAILY WITH SUPPER 90 tablet 3   No facility-administered medications prior to visit.      Per HPI unless specifically indicated in ROS section below Review of Systems     Objective:    BP 120/74 (BP Location: Left Arm, Patient Position: Sitting, Cuff Size: Normal)   Pulse 75   Temp 97.8 F (36.6 C) (Oral)   Ht 5\' 8"  (1.727 m)   Wt 133 lb (60.3 kg)   SpO2 97%   BMI 20.22 kg/m   Wt Readings from Last 3  Encounters:  12/10/17 133 lb (60.3 kg)  11/27/17 133 lb (60.3 kg)  08/29/17 139 lb (63 kg)    Physical Exam  Constitutional: She appears well-developed and well-nourished. No distress.  HENT:  Mouth/Throat: Oropharynx is clear and moist. No oropharyngeal exudate.  Cardiovascular: Normal rate, regular rhythm and normal heart sounds.  No murmur heard. Pulmonary/Chest: Effort normal and breath sounds normal. No respiratory distress. She has no wheezes. She has no rales.  Musculoskeletal: She exhibits no edema.  Neurological: She is alert.  Psychiatric: Her speech is normal and behavior is normal. Thought content normal. Her mood appears anxious.  Nursing note and vitals reviewed.  Results for orders placed or performed in visit on 11/21/17    Vitamin B12  Result Value Ref Range   Vitamin B-12 339 211 - 911 pg/mL  CBC with Differential/Platelet  Result Value Ref Range   WBC 6.1 4.0 - 10.5 K/uL   RBC 4.33 3.87 - 5.11 Mil/uL   Hemoglobin 14.8 12.0 - 15.0 g/dL   HCT 42.2 36.0 - 46.0 %   MCV 97.5 78.0 - 100.0 fl   MCHC 34.9 30.0 - 36.0 g/dL   RDW 14.0 11.5 - 15.5 %   Platelets 187.0 150.0 - 400.0 K/uL   Neutrophils Relative % 55.7 43.0 - 77.0 %   Lymphocytes Relative 32.8 12.0 - 46.0 %   Monocytes Relative 7.4 3.0 - 12.0 %   Eosinophils Relative 2.6 0.0 - 5.0 %   Basophils Relative 1.5 0.0 - 3.0 %   Neutro Abs 3.4 1.4 - 7.7 K/uL   Lymphs Abs 2.0 0.7 - 4.0 K/uL   Monocytes Absolute 0.5 0.1 - 1.0 K/uL   Eosinophils Absolute 0.2 0.0 - 0.7 K/uL   Basophils Absolute 0.1 0.0 - 0.1 K/uL  IBC panel  Result Value Ref Range   Iron 106 42 - 145 ug/dL   Transferrin 198.0 (L) 212.0 - 360.0 mg/dL   Saturation Ratios 38.2 20.0 - 50.0 %  Ferritin  Result Value Ref Range   Ferritin 131.5 10.0 - 291.0 ng/mL      Assessment & Plan:   Problem List Items Addressed This Visit    Low serum vitamin B12    Continue b12 shot Q2 wks for 1 month then start monthly at next shot. RTC 2 wks f/u visit.       Relevant Medications   cyanocobalamin ((VITAMIN B-12)) injection 1,000 mcg (Completed) (Start on 12/10/2017  5:30 PM)   Iron deficiency    Reviewed latest levels with patient - no need for repeat infusion at this time. Transferrin levels low, but all other levels normal. RDW had normalized. Continue oral iron intake.       Family circumstance    Discussed recent increase in family stressors.      Dyspnea - Primary    This has recurred. Not related to IDA this time. Has had extensive cadiac, pulmonary ,and neurological evaluation. Anticipate some contribution of chronic anxiety to symptoms. Will start sertraline 25mg  daily with hydroxyzine 10mg  BID PRN anxiety. RTC 2 wks f/u visit. Pt agrees with plan.  Will continue B12 replacement as  per above.           Meds ordered this encounter  Medications  . sertraline (ZOLOFT) 25 MG tablet    Sig: Take 1 tablet (25 mg total) by mouth daily.    Dispense:  30 tablet    Refill:  3  . hydrOXYzine (ATARAX/VISTARIL) 10 MG tablet    Sig:  Take 1 tablet (10 mg total) by mouth 2 (two) times daily as needed for anxiety (sedation precautions).    Dispense:  30 tablet    Refill:  0  . cyanocobalamin ((VITAMIN B-12)) injection 1,000 mcg   No orders of the defined types were placed in this encounter.   Follow up plan: Return in about 2 weeks (around 12/24/2017) for follow up visit.  [name], MD

## 2017-12-10 NOTE — Telephone Encounter (Signed)
Pt has appt to see Dr Darnell Level today at [facility].

## 2017-12-10 NOTE — Assessment & Plan Note (Addendum)
Reviewed latest levels with patient - no need for repeat infusion at this time. Transferrin levels low, but all other levels normal. RDW had normalized. Continue oral iron intake.

## 2017-12-10 NOTE — Patient Instructions (Addendum)
B12 shot today. Then next one will be in 2 weeks, and start monthly at that time.  Schedule follow up visit in 2 weeks with me around 10/21.  Try sertraline 25mg  daily. Try hydroxyzine 10mg  as needed for nerves - this can make you sleepy so don't take and drive.

## 2017-12-10 NOTE — Assessment & Plan Note (Signed)
This has recurred. Not related to IDA this time. Has had extensive cadiac, pulmonary ,and neurological evaluation. Anticipate some contribution of chronic anxiety to symptoms. Will start sertraline 25mg  daily with hydroxyzine 10mg  BID PRN anxiety. RTC 2 wks f/u visit. Pt agrees with plan.  Will continue B12 replacement as per above.

## 2017-12-10 NOTE — Assessment & Plan Note (Addendum)
Discussed recent increase in family stressors.

## 2017-12-12 ENCOUNTER — Ambulatory Visit: Payer: Medicare Other

## 2017-12-20 ENCOUNTER — Ambulatory Visit: Payer: Medicare Other

## 2017-12-28 ENCOUNTER — Ambulatory Visit (INDEPENDENT_AMBULATORY_CARE_PROVIDER_SITE_OTHER): Payer: Medicare Other | Admitting: Family Medicine

## 2017-12-28 ENCOUNTER — Encounter: Payer: Self-pay | Admitting: Family Medicine

## 2017-12-28 VITALS — BP 118/60 | HR 80 | Temp 97.9°F | Ht 68.0 in | Wt 137.5 lb

## 2017-12-28 DIAGNOSIS — E538 Deficiency of other specified B group vitamins: Secondary | ICD-10-CM | POA: Diagnosis not present

## 2017-12-28 DIAGNOSIS — R5382 Chronic fatigue, unspecified: Secondary | ICD-10-CM | POA: Diagnosis not present

## 2017-12-28 DIAGNOSIS — R0602 Shortness of breath: Secondary | ICD-10-CM

## 2017-12-28 MED ORDER — CYANOCOBALAMIN 1000 MCG/ML IJ SOLN
1000.0000 ug | Freq: Once | INTRAMUSCULAR | Status: AC
  Administered 2017-12-28: 1000 ug via INTRAMUSCULAR

## 2017-12-28 NOTE — Assessment & Plan Note (Signed)
Possibly anxiety related - will continue sertraline 25mg  daily - tolerating well.  Hydroxyzine overly sedating - rec try 1/2 tab 10mg  at next dose if desired.  Reassess at 1 mo f/u visit. Pt agrees.

## 2017-12-28 NOTE — Assessment & Plan Note (Signed)
This is slowly improving.

## 2017-12-28 NOTE — Progress Notes (Signed)
BP 118/60 (BP Location: Left Arm, Patient Position: Sitting, Cuff Size: Normal)   Pulse 80   Temp 97.9 F (36.6 C) (Oral)   Ht 5\' 8"  (1.727 m)   Wt 137 lb 8 oz (62.4 kg)   SpO2 96%   BMI 20.91 kg/m    CC: 2 wk f/u Subjective:    Patient ID: Sierra Kaiser, female    DOB: May 21, 1933, 84 y.o.   MRN: [id number]  HPI: Sierra Kaiser is a 84 y.o. female presenting on 12/28/2017 for B12 Injection (Here for f/u. ) and Discuss Medication (Wants to discuss hydroxyzine.)   See prior note for details. Continues caring for husband.  Previously had thorough cardiac neurologic and pulm evaluation for chronic dyspnea.  High stress recently. Recent return of fatigue, dyspnea, malaise, dizziness and paresthesias of hands.  ?anxiety related - last visit we continued B12 shots and started sertraline 25mg  daily with hydroxyzine 10mg  BID PRN anxiety. Hydroxyzine 10mg  was very sedating. She asks about cutting in half. She stays jitery. Feels dyspnea slowly improving, paresthesias slowly improving  Planning upcoming trip to [facility] with friend.   Relevant past medical, surgical, family and social history reviewed and updated as indicated. Interim medical history since our last visit reviewed. Allergies and medications reviewed and updated. Outpatient Medications Prior to Visit  Medication Sig Dispense Refill  . cyanocobalamin (,VITAMIN B-12,) 1000 MCG/ML injection Inject 1 mL (1,000 mcg total) into the muscle every 30 (thirty) days.    . diphenhydrAMINE-zinc acetate (BENADRYL) cream Apply 1 application topically 2 (two) times daily as needed for itching.    . Ferrous Sulfate (IRON) 325 (65 Fe) MG TABS Take 1 tablet (325 mg total) by mouth every other day.    Marland Kitchen FIBER PO Take 1 tablet by mouth daily. 325 mg    . fluticasone (FLONASE) 50 MCG/ACT nasal spray Place 1 spray into both nostrils daily.     . fluticasone (FLONASE) 50 MCG/ACT nasal spray SPRAY 2 SPRAYS INTO EACH NOSTRIL EVERY DAY 16 g 3    . furosemide (LASIX) 40 MG tablet TAKE 1 TABLET DAILY 90 tablet 2  . hydrOXYzine (ATARAX/VISTARIL) 10 MG tablet Take 1 tablet (10 mg total) by mouth 2 (two) times daily as needed for anxiety (sedation precautions). 30 tablet 0  . omeprazole (PRILOSEC) 40 MG capsule TAKE 1 CAPSULE DAILY 90 capsule 2  . Polyethyl Glycol-Propyl Glycol (SYSTANE OP) Place 1 drop into both eyes daily as needed (dry eyes).    . rosuvastatin (CRESTOR) 20 MG tablet TAKE 1 TABLET DAILY 30 tablet 0  . sertraline (ZOLOFT) 25 MG tablet Take 1 tablet (25 mg total) by mouth daily. 30 tablet 3  . spironolactone (ALDACTONE) 25 MG tablet Take 25 mg by mouth daily.    . vitamin B-12 (CYANOCOBALAMIN) 1000 MCG tablet Take 1,000 mcg by mouth daily.    Alveda Reasons 15 MG TABS tablet TAKE 1 TABLET DAILY WITH SUPPER 90 tablet 3   No facility-administered medications prior to visit.      Per HPI unless specifically indicated in ROS section below Review of Systems     Objective:    BP 118/60 (BP Location: Left Arm, Patient Position: Sitting, Cuff Size: Normal)   Pulse 80   Temp 97.9 F (36.6 C) (Oral)   Ht 5\' 8"  (1.727 m)   Wt 137 lb 8 oz (62.4 kg)   SpO2 96%   BMI 20.91 kg/m   Wt Readings from Last 3 Encounters:  12/28/17  137 lb 8 oz (62.4 kg)  12/10/17 133 lb (60.3 kg)  11/27/17 133 lb (60.3 kg)    Physical Exam  Constitutional: She appears well-developed and well-nourished. No distress.  HENT:  Mouth/Throat: Oropharynx is clear and moist. No oropharyngeal exudate.  Cardiovascular: Normal rate, regular rhythm and normal heart sounds.  No murmur heard. Pulmonary/Chest: Effort normal and breath sounds normal. No respiratory distress. She has no wheezes. She has no rales.  Musculoskeletal: She exhibits no edema.  Psychiatric: She has a normal mood and affect.  Nursing note and vitals reviewed.  Results for orders placed or performed in visit on 11/21/17  Vitamin B12  Result Value Ref Range   Vitamin B-12 339 211 -  911 pg/mL  CBC with Differential/Platelet  Result Value Ref Range   WBC 6.1 4.0 - 10.5 K/uL   RBC 4.33 3.87 - 5.11 Mil/uL   Hemoglobin 14.8 12.0 - 15.0 g/dL   HCT 42.2 36.0 - 46.0 %   MCV 97.5 78.0 - 100.0 fl   MCHC 34.9 30.0 - 36.0 g/dL   RDW 14.0 11.5 - 15.5 %   Platelets 187.0 150.0 - 400.0 K/uL   Neutrophils Relative % 55.7 43.0 - 77.0 %   Lymphocytes Relative 32.8 12.0 - 46.0 %   Monocytes Relative 7.4 3.0 - 12.0 %   Eosinophils Relative 2.6 0.0 - 5.0 %   Basophils Relative 1.5 0.0 - 3.0 %   Neutro Abs 3.4 1.4 - 7.7 K/uL   Lymphs Abs 2.0 0.7 - 4.0 K/uL   Monocytes Absolute 0.5 0.1 - 1.0 K/uL   Eosinophils Absolute 0.2 0.0 - 0.7 K/uL   Basophils Absolute 0.1 0.0 - 0.1 K/uL  IBC panel  Result Value Ref Range   Iron 106 42 - 145 ug/dL   Transferrin 198.0 (L) 212.0 - 360.0 mg/dL   Saturation Ratios 38.2 20.0 - 50.0 %  Ferritin  Result Value Ref Range   Ferritin 131.5 10.0 - 291.0 ng/mL      Assessment & Plan:   Problem List Items Addressed This Visit    Low serum vitamin B12 - Primary    Has received B12 shots Q2 wks for a month, now will start monthly injections.       Relevant Medications   cyanocobalamin ((VITAMIN B-12)) injection 1,000 mcg (Completed)   Dyspnea    Possibly anxiety related - will continue sertraline 25mg  daily - tolerating well.  Hydroxyzine overly sedating - rec try 1/2 tab 10mg  at next dose if desired.  Reassess at 1 mo f/u visit. Pt agrees.       Chronic fatigue    This is slowly improving.           Meds ordered this encounter  Medications  . cyanocobalamin ((VITAMIN B-12)) injection 1,000 mcg   No orders of the defined types were placed in this encounter.   Follow up plan: No follow-ups on file.  [name], MD

## 2017-12-28 NOTE — Assessment & Plan Note (Signed)
Has received B12 shots Q2 wks for a month, now will start monthly injections.

## 2017-12-28 NOTE — Patient Instructions (Addendum)
Continue sertraline 25mg  daily.  May try hydroxyzine 1/2 tablet at a time as needed.  We will start monthly b12 shots started today.  If interested, check with pharmacy about new 2 shot shingles series (shingrix).

## 2018-01-28 ENCOUNTER — Telehealth: Payer: Self-pay | Admitting: Family Medicine

## 2018-01-28 ENCOUNTER — Ambulatory Visit (INDEPENDENT_AMBULATORY_CARE_PROVIDER_SITE_OTHER): Payer: Medicare Other | Admitting: Family Medicine

## 2018-01-28 ENCOUNTER — Encounter: Payer: Self-pay | Admitting: Family Medicine

## 2018-01-28 VITALS — BP 120/72 | HR 73 | Temp 97.8°F | Ht 68.0 in | Wt 135.0 lb

## 2018-01-28 DIAGNOSIS — R0600 Dyspnea, unspecified: Secondary | ICD-10-CM | POA: Diagnosis not present

## 2018-01-28 DIAGNOSIS — R1030 Lower abdominal pain, unspecified: Secondary | ICD-10-CM

## 2018-01-28 DIAGNOSIS — F419 Anxiety disorder, unspecified: Secondary | ICD-10-CM | POA: Diagnosis not present

## 2018-01-28 DIAGNOSIS — R0609 Other forms of dyspnea: Secondary | ICD-10-CM

## 2018-01-28 DIAGNOSIS — R1032 Left lower quadrant pain: Secondary | ICD-10-CM | POA: Insufficient documentation

## 2018-01-28 DIAGNOSIS — E538 Deficiency of other specified B group vitamins: Secondary | ICD-10-CM | POA: Diagnosis not present

## 2018-01-28 LAB — COMPREHENSIVE METABOLIC PANEL
ALT: 9 U/L (ref 0–35)
AST: 12 U/L (ref 0–37)
Albumin: 4.7 g/dL (ref 3.5–5.2)
Alkaline Phosphatase: 61 U/L (ref 39–117)
BUN: 20 mg/dL (ref 6–23)
CO2: 27 mEq/L (ref 19–32)
Calcium: 10.3 mg/dL (ref 8.4–10.5)
Chloride: 107 mEq/L (ref 96–112)
Creatinine, Ser: 1.2 mg/dL (ref 0.40–1.20)
GFR: 45.63 mL/min — ABNORMAL LOW (ref >60.00)
Glucose, Bld: 85 mg/dL (ref 70–99)
Potassium: 4.6 mEq/L (ref 3.5–5.1)
Sodium: 142 mEq/L (ref 135–145)
Total Bilirubin: 0.6 mg/dL (ref 0.2–1.2)
Total Protein: 7 g/dL (ref 6.0–8.3)

## 2018-01-28 LAB — POC URINALSYSI DIPSTICK (AUTOMATED)
Blood, UA: NEGATIVE
Glucose, UA: NEGATIVE
Nitrite, UA: NEGATIVE
Protein, UA: POSITIVE — AB
Spec Grav, UA: 1.025 (ref 1.010–1.025)
Urobilinogen, UA: 0.2 E.U./dL
pH, UA: 5 (ref 5.0–8.0)

## 2018-01-28 LAB — CBC WITH DIFFERENTIAL/PLATELET
Basophils Absolute: 0.1 10*3/uL (ref 0.0–0.1)
Basophils Relative: 0.9 % (ref 0.0–3.0)
Eosinophils Absolute: 0.1 10*3/uL (ref 0.0–0.7)
Eosinophils Relative: 1.8 % (ref 0.0–5.0)
HCT: 45.4 % (ref 36.0–46.0)
Hemoglobin: 15.4 g/dL — ABNORMAL HIGH (ref 12.0–15.0)
Lymphocytes Relative: 28.6 % (ref 12.0–46.0)
Lymphs Abs: 1.9 10*3/uL (ref 0.7–4.0)
MCHC: 33.9 g/dL (ref 30.0–36.0)
MCV: 98 fl (ref 78.0–100.0)
Monocytes Absolute: 0.6 10*3/uL (ref 0.1–1.0)
Monocytes Relative: 8.6 % (ref 3.0–12.0)
Neutro Abs: 4 10*3/uL (ref 1.4–7.7)
Neutrophils Relative %: 60.1 % (ref 43.0–77.0)
Platelets: 179 10*3/uL (ref 150.0–400.0)
RBC: 4.64 Mil/uL (ref 3.87–5.11)
RDW: 12.6 % (ref 11.5–15.5)
WBC: 6.7 10*3/uL (ref 4.0–10.5)

## 2018-01-28 MED ORDER — CYANOCOBALAMIN 1000 MCG/ML IJ SOLN
1000.0000 ug | Freq: Once | INTRAMUSCULAR | Status: AC
  Administered 2018-01-28: 1000 ug via INTRAMUSCULAR

## 2018-01-28 MED ORDER — FLUVOXAMINE MALEATE 25 MG PO TABS: 25.0000 mg | ORAL_TABLET | Freq: Every day | ORAL | 3 refills | Status: DC

## 2018-01-28 MED ORDER — PAROXETINE HCL 10 MG PO TABS: 10.0000 mg | ORAL_TABLET | Freq: Every day | ORAL | 3 refills | Status: DC

## 2018-01-28 NOTE — Patient Instructions (Addendum)
B12 shot today.  Stop sertraline. Start luvox (fluvoxamine) 25mg  nightly.  Urine check today, labs today. I worry about diverticulitis - pending results we may start you on augmentin course.

## 2018-01-28 NOTE — Assessment & Plan Note (Signed)
b12 shot today (receiving monthly)

## 2018-01-28 NOTE — Telephone Encounter (Signed)
Opened in error

## 2018-01-28 NOTE — Addendum Note (Signed)
Addended by: Brenton Grills on: [id number]/9688 03:19 PM   Modules accepted: Orders

## 2018-01-28 NOTE — Progress Notes (Addendum)
BP 120/72 (BP Location: Left Arm, Patient Position: Sitting, Cuff Size: Normal)   Pulse 73   Temp 97.8 F (36.6 C) (Oral)   Ht 5\' 8"  (1.727 m)   Wt 135 lb (61.2 kg)   SpO2 96%   BMI 20.53 kg/m    CC: 1 mo f/u visit Subjective:    Patient ID: Sierra Kaiser, female    DOB: 22-Mar-1933, 84 y.o.   MRN: [id number]  HPI: Sierra Kaiser is a 84 y.o. female presenting on 01/28/2018 for Shortness of Breath (Here for 1 mo f/u.)   See prior note for details. Recent trip to [facility] with friend. Dyspnea, chronic fatigue thought partly related to anxiety - after extensive cardiac and pulmonary workup. She continues b12 shots monthly (initially received Q2 wks). We have recently started sertraline 25mg  daily with hydroxyzine 5mg  PRN. She feels she's overall doing well. Has noted possible worsening tremors since on sertraline.   Over last 3 days has had increased loose stools, yesterday watery diarrhea. Intermittent LLQ pain and cramping. She normally runs constipated. H/o diverticulitis. No fevers/chills. Denies dysuria or other significant UTI sxs.   Asks about changing lasix to 40mg  PRN (this is how she's been taking it).   Relevant past medical, surgical, family and social history reviewed and updated as indicated. Interim medical history since our last visit reviewed. Allergies and medications reviewed and updated. Outpatient Medications Prior to Visit  Medication Sig Dispense Refill  . cyanocobalamin (,VITAMIN B-12,) 1000 MCG/ML injection Inject 1 mL (1,000 mcg total) into the muscle every 30 (thirty) days.    . diphenhydrAMINE-zinc acetate (BENADRYL) cream Apply 1 application topically 2 (two) times daily as needed for itching.    . Ferrous Sulfate (IRON) 325 (65 Fe) MG TABS Take 1 tablet (325 mg total) by mouth every other day.    Marland Kitchen FIBER PO Take 1 tablet by mouth daily. 325 mg    . fluticasone (FLONASE) 50 MCG/ACT nasal spray SPRAY 2 SPRAYS INTO EACH NOSTRIL EVERY DAY 16 g 3  .  furosemide (LASIX) 40 MG tablet TAKE 1 TABLET DAILY 90 tablet 2  . omeprazole (PRILOSEC) 40 MG capsule TAKE 1 CAPSULE DAILY 90 capsule 2  . Polyethyl Glycol-Propyl Glycol (SYSTANE OP) Place 1 drop into both eyes daily as needed (dry eyes).    . rosuvastatin (CRESTOR) 20 MG tablet TAKE 1 TABLET DAILY 30 tablet 0  . vitamin B-12 (CYANOCOBALAMIN) 1000 MCG tablet Take 1,000 mcg by mouth daily.    Alveda Reasons 15 MG TABS tablet TAKE 1 TABLET DAILY WITH SUPPER 90 tablet 3  . hydrOXYzine (ATARAX/VISTARIL) 10 MG tablet Take 1 tablet (10 mg total) by mouth 2 (two) times daily as needed for anxiety (sedation precautions). 30 tablet 0  . sertraline (ZOLOFT) 25 MG tablet Take 1 tablet (25 mg total) by mouth daily. 30 tablet 3  . fluticasone (FLONASE) 50 MCG/ACT nasal spray Place 1 spray into both nostrils daily.     Marland Kitchen spironolactone (ALDACTONE) 25 MG tablet Take 25 mg by mouth daily.     No facility-administered medications prior to visit.      Per HPI unless specifically indicated in ROS section below Review of Systems     Objective:    BP 120/72 (BP Location: Left Arm, Patient Position: Sitting, Cuff Size: Normal)   Pulse 73   Temp 97.8 F (36.6 C) (Oral)   Ht 5\' 8"  (1.727 m)   Wt 135 lb (61.2 kg)   SpO2 96%  BMI 20.53 kg/m   Wt Readings from Last 3 Encounters:  01/28/18 135 lb (61.2 kg)  12/28/17 137 lb 8 oz (62.4 kg)  12/10/17 133 lb (60.3 kg)    Physical Exam  Constitutional: She appears well-developed and well-nourished. No distress.  HENT:  Mouth/Throat: Oropharynx is clear and moist. No oropharyngeal exudate.  Cardiovascular: Normal rate, regular rhythm and normal heart sounds.  No murmur heard. Pulmonary/Chest: Effort normal and breath sounds normal. No respiratory distress. She has no wheezes. She has no rales.  Abdominal: Soft. Bowel sounds are normal. She exhibits no distension and no mass. There is no hepatosplenomegaly. There is tenderness (mild) in the suprapubic area. There  is no rebound, no guarding, no CVA tenderness and negative Murphy's sign. No hernia.  Musculoskeletal: She exhibits no edema.  Psychiatric: She has a normal mood and affect.  Nursing note and vitals reviewed.  Results for orders placed or performed in visit on 01/28/18  Comprehensive metabolic panel  Result Value Ref Range   Sodium 142 135 - 145 mEq/L   Potassium 4.6 3.5 - 5.1 mEq/L   Chloride 107 96 - 112 mEq/L   CO2 27 19 - 32 mEq/L   Glucose, Bld 85 70 - 99 mg/dL   BUN 20 6 - 23 mg/dL   Creatinine, Ser 1.20 0.40 - 1.20 mg/dL   Total Bilirubin 0.6 0.2 - 1.2 mg/dL   Alkaline Phosphatase 61 39 - 117 U/L   AST 12 0 - 37 U/L   ALT 9 0 - 35 U/L   Total Protein 7.0 6.0 - 8.3 g/dL   Albumin 4.7 3.5 - 5.2 g/dL   Calcium 10.3 8.4 - 10.5 mg/dL   GFR 45.63 (L) >60.00 mL/min  CBC with Differential/Platelet  Result Value Ref Range   WBC 6.7 4.0 - 10.5 K/uL   RBC 4.64 3.87 - 5.11 Mil/uL   Hemoglobin 15.4 (H) 12.0 - 15.0 g/dL   HCT 45.4 36.0 - 46.0 %   MCV 98.0 78.0 - 100.0 fl   MCHC 33.9 30.0 - 36.0 g/dL   RDW 12.6 11.5 - 15.5 %   Platelets 179.0 150.0 - 400.0 K/uL   Neutrophils Relative % 60.1 43.0 - 77.0 %   Lymphocytes Relative 28.6 12.0 - 46.0 %   Monocytes Relative 8.6 3.0 - 12.0 %   Eosinophils Relative 1.8 0.0 - 5.0 %   Basophils Relative 0.9 0.0 - 3.0 %   Neutro Abs 4.0 1.4 - 7.7 K/uL   Lymphs Abs 1.9 0.7 - 4.0 K/uL   Monocytes Absolute 0.6 0.1 - 1.0 K/uL   Eosinophils Absolute 0.1 0.0 - 0.7 K/uL   Basophils Absolute 0.1 0.0 - 0.1 K/uL  POCT Urinalysis Dipstick (Automated)  Result Value Ref Range   Color, UA gold    Clarity, UA clear    Glucose, UA Negative Negative   Bilirubin, UA 2+    Ketones, UA +/-    Spec Grav, UA 1.025 1.010 - 1.025   Blood, UA negative    pH, UA 5.0 5.0 - 8.0   Protein, UA Positive (A) Negative   Urobilinogen, UA 0.2 0.2 or 1.0 E.U./dL   Nitrite, UA negative    Leukocytes, UA Small (1+) (A) Negative      Assessment & Plan:   Problem  List Items Addressed This Visit    Lower abdominal pain - Primary    In h/o diverticulitis, concern for recurrence of same - check CBC, UA, CMP. Pending results will likely treat  with augmentin course. Pt agrees with plan.  UA today - possible infection, possible contamination. Will await UCx results.       Relevant Orders   Comprehensive metabolic panel (Completed)   CBC with Differential/Platelet (Completed)   POCT Urinalysis Dipstick (Automated) (Completed)   Urine Culture   Low serum vitamin B12    b12 shot today (receiving monthly)      Relevant Medications   cyanocobalamin ((VITAMIN B-12)) injection 1,000 mcg (Completed)   Chronic dyspnea    Pt endorses this has improved since starting sertraline. See below. Will monitor effect of luvox 25mg  nightly in place of sertraline .       Anxiety    Anticipate contributing to chronic dyspnea - improved with addition of SSRI - however concern for sertraline worsening tremors - will trial luvox (fluvoxamine) - chosen due to less anticholinergic properties, decreased chances or restlessness/anxiety.  RTC 1 mo f/u visit.       Relevant Medications   fluvoxaMINE (LUVOX) 25 MG tablet       Meds ordered this encounter  Medications  . cyanocobalamin ((VITAMIN B-12)) injection 1,000 mcg  . DISCONTD: PARoxetine (PAXIL) 10 MG tablet    Sig: Take 1 tablet (10 mg total) by mouth daily.    Dispense:  30 tablet    Refill:  3    In place of sertraline  . fluvoxaMINE (LUVOX) 25 MG tablet    Sig: Take 1 tablet (25 mg total) by mouth at bedtime.    Dispense:  30 tablet    Refill:  3    Cancel paxil Rx, this will be in place of sertraline   Orders Placed This Encounter  Procedures  . Urine Culture  . Comprehensive metabolic panel  . CBC with Differential/Platelet  . POCT Urinalysis Dipstick (Automated)    Follow up plan: Return if symptoms worsen or fail to improve.  [name], MD

## 2018-01-28 NOTE — Assessment & Plan Note (Addendum)
In h/o diverticulitis, concern for recurrence of same - check CBC, UA, CMP. Pending results will likely treat with augmentin course. Pt agrees with plan.  UA today - possible infection, possible contamination. Will await UCx results.

## 2018-01-28 NOTE — Assessment & Plan Note (Addendum)
Anticipate contributing to chronic dyspnea - improved with addition of SSRI - however concern for sertraline worsening tremors - will trial luvox (fluvoxamine) - chosen due to less anticholinergic properties, decreased chances or restlessness/anxiety.  RTC 1 mo f/u visit.

## 2018-01-28 NOTE — Addendum Note (Signed)
Addended by: Brenton Grills on: 46/80/3212 04:46 PM   Modules accepted: Orders

## 2018-01-28 NOTE — Assessment & Plan Note (Addendum)
Pt endorses this has improved since starting sertraline. See below. Will monitor effect of luvox 25mg  nightly in place of sertraline .

## 2018-01-29 NOTE — Telephone Encounter (Signed)
I called pt to set up 1 month mood follow up as instructed. She has questions about the 2 medications she picked up after the OV 11/25 -fluvcxamine maleate and parcxetine. She thought she was picking up an antibiotic for diverticulitis so she would like a cb to discuss.

## 2018-01-29 NOTE — Telephone Encounter (Addendum)
She should only have picked up fluvoxamine, not paroxetine. Only take fluvoxamine, discard or return paroxetine - I sent pharmacy a note telling them not to fill paroxetine.  I didn't sent antibiotic as blood work was reassuringly ok, want to await urine culture results prior to deciding on whether we need an antibiotic.

## 2018-01-30 ENCOUNTER — Other Ambulatory Visit: Payer: Self-pay | Admitting: Family Medicine

## 2018-01-30 LAB — URINE CULTURE
MICRO NUMBER:: 91418539
SPECIMEN QUALITY:: ADEQUATE

## 2018-01-30 MED ORDER — CIPROFLOXACIN HCL 250 MG PO TABS: 250.0000 mg | ORAL_TABLET | Freq: Two times a day (BID) | ORAL | 0 refills | Status: DC

## 2018-01-30 NOTE — Telephone Encounter (Signed)
Spoke with pt relaying message per Dr. Darnell Level.  Pt verbalizes understanding to only take fluvoxamine and abx.

## 2018-02-03 ENCOUNTER — Other Ambulatory Visit: Payer: Self-pay | Admitting: Family Medicine

## 2018-02-13 ENCOUNTER — Ambulatory Visit (INDEPENDENT_AMBULATORY_CARE_PROVIDER_SITE_OTHER): Payer: Medicare Other

## 2018-02-13 ENCOUNTER — Telehealth: Payer: Self-pay | Admitting: Cardiology

## 2018-02-13 DIAGNOSIS — I442 Atrioventricular block, complete: Secondary | ICD-10-CM

## 2018-02-13 NOTE — Telephone Encounter (Signed)
Spoke with pt and reminded pt of remote transmission that is due today. Pt verbalized understanding.   

## 2018-02-15 ENCOUNTER — Encounter: Payer: Self-pay | Admitting: Cardiology

## 2018-02-15 NOTE — Progress Notes (Signed)
Remote pacemaker transmission.   

## 2018-02-22 ENCOUNTER — Ambulatory Visit: Payer: Medicare Other | Admitting: Family Medicine

## 2018-02-28 ENCOUNTER — Encounter: Payer: Self-pay | Admitting: Family Medicine

## 2018-02-28 NOTE — Progress Notes (Signed)
BP 122/62 (BP Location: Left Arm, Patient Position: Sitting, Cuff Size: Normal)   Pulse 70   Temp 98.4 F (36.9 C) (Oral)   Ht 5\' 8"  (1.727 m)   Wt 138 lb 8 oz (62.8 kg)   SpO2 95%   BMI 21.06 kg/m    CC: 1 mo f/u visit Subjective:    Patient ID: Sierra Kaiser, female    DOB: Mar 16, 1933, 84 y.o.   MRN: [id number]  HPI: Sierra Kaiser is a 84 y.o. female presenting on 03/01/2018 for Anxiety (Here for 1 mo f/u. )   Ongoing family stress.  See prior note for details. Chronic dyspnea possibly related to anxiety - a few months ago we started SSRI sertraline - this helped but may have worsened tremors. We transitioned to fluvoxamine (less anticholinergic properties and possible decreased restlessness). Tolerating this medicine well - but may be causing some trouble sleeping - will change to AM dosing.  Dyspnea is significantly better - able to walk more regularly.   Oh by the way.. Treated last month for UTI with ciprofloxacin 5d course. At that time had LLQ pain, which resolved on abx.  Now having suprapubic discomfort. Bowels are softer than normal, possibly with mucous. No fevers/chills, nausea/vomiting, dysuria. Worried she has persistent symptoms.      Relevant past medical, surgical, family and social history reviewed and updated as indicated. Interim medical history since our last visit reviewed. Allergies and medications reviewed and updated. Outpatient Medications Prior to Visit  Medication Sig Dispense Refill  . cyanocobalamin (,VITAMIN B-12,) 1000 MCG/ML injection Inject 1 mL (1,000 mcg total) into the muscle every 30 (thirty) days.    . diphenhydrAMINE-zinc acetate (BENADRYL) cream Apply 1 application topically 2 (two) times daily as needed for itching.    . Ferrous Sulfate (IRON) 325 (65 Fe) MG TABS Take 1 tablet (325 mg total) by mouth every other day.    Marland Kitchen FIBER PO Take 1 tablet by mouth daily. 325 mg    . fluticasone (FLONASE) 50 MCG/ACT nasal spray SPRAY 2 SPRAYS  INTO EACH NOSTRIL EVERY DAY 16 g 3  . fluvoxaMINE (LUVOX) 25 MG tablet Take 1 tablet (25 mg total) by mouth at bedtime. 30 tablet 3  . furosemide (LASIX) 40 MG tablet TAKE 1 TABLET DAILY 90 tablet 2  . omeprazole (PRILOSEC) 40 MG capsule TAKE 1 CAPSULE DAILY 90 capsule 0  . Polyethyl Glycol-Propyl Glycol (SYSTANE OP) Place 1 drop into both eyes daily as needed (dry eyes).    . rosuvastatin (CRESTOR) 20 MG tablet TAKE 1 TABLET DAILY 30 tablet 0  . vitamin B-12 (CYANOCOBALAMIN) 1000 MCG tablet Take 1,000 mcg by mouth daily.    Sierra Kaiser 15 MG TABS tablet TAKE 1 TABLET DAILY WITH SUPPER 90 tablet 3  . spironolactone (ALDACTONE) 25 MG tablet Take 25 mg by mouth daily.    . ciprofloxacin (CIPRO) 250 MG tablet Take 1 tablet (250 mg total) by mouth 2 (two) times daily. 10 tablet 0  . fluticasone (FLONASE) 50 MCG/ACT nasal spray Place 1 spray into both nostrils daily.      No facility-administered medications prior to visit.      Per HPI unless specifically indicated in ROS section below Review of Systems Objective:    BP 122/62 (BP Location: Left Arm, Patient Position: Sitting, Cuff Size: Normal)   Pulse 70   Temp 98.4 F (36.9 C) (Oral)   Ht 5\' 8"  (1.727 m)   Wt 138 lb 8 oz (62.8  kg)   SpO2 95%   BMI 21.06 kg/m   Wt Readings from Last 3 Encounters:  03/01/18 138 lb 8 oz (62.8 kg)  01/28/18 135 lb (61.2 kg)  12/28/17 137 lb 8 oz (62.4 kg)    Physical Exam Vitals signs and nursing note reviewed.  Constitutional:      General: She is not in acute distress.    Appearance: Normal appearance. She is normal weight.  HENT:     Mouth/Throat:     Mouth: Mucous membranes are moist.     Pharynx: Oropharynx is clear.  Abdominal:     General: Abdomen is flat. Bowel sounds are normal. There is no distension.     Palpations: Abdomen is soft. There is no mass.     Tenderness: There is abdominal tenderness (mild) in the suprapubic area. There is no right CVA tenderness, left CVA tenderness,  guarding or rebound. Negative signs include Murphy's sign.     Hernia: No hernia is present.  Musculoskeletal:     Right lower leg: No edema.     Left lower leg: No edema.  Neurological:     Mental Status: She is alert.  Psychiatric:        Mood and Affect: Mood normal.        Behavior: Behavior normal.        Thought Content: Thought content normal.        Judgment: Judgment normal.       Results for orders placed or performed in visit on 03/01/18  POCT Urinalysis Dipstick (Automated)  Result Value Ref Range   Color, UA yellow    Clarity, UA clear    Glucose, UA Negative Negative   Bilirubin, UA 1+    Ketones, UA negative    Spec Grav, UA 1.025 1.010 - 1.025   Blood, UA negative    pH, UA 5.5 5.0 - 8.0   Protein, UA Negative Negative   Urobilinogen, UA 0.2 0.2 or 1.0 E.U./dL   Nitrite, UA negative    Leukocytes, UA Negative Negative   Depression screen [facility] 2/9 01/17/2017 01/12/2017 11/24/2016 10/09/2016 10/07/2015  Decreased Interest 0 0 2 3 0  Down, Depressed, Hopeless 0 0 0 1 0  PHQ - 2 Score 0 0 2 4 0  Altered sleeping 0 1 1 1  -  Tired, decreased energy 0 1 2 3  -  Change in appetite 0 1 2 0 -  Feeling bad or failure about yourself  0 0 1 2 -  Trouble concentrating 0 0 1 1 -  Moving slowly or fidgety/restless 0 1 2 2  -  Suicidal thoughts 0 0 0 0 -  PHQ-9 Score 0 4 11 13  -  Difficult doing work/chores Not difficult at all Somewhat difficult Somewhat difficult Extremely dIfficult -    GAD 7 : Generalized Anxiety Score 03/01/2018  Nervous, Anxious, on Edge 1  Control/stop worrying 0  Worry too much - different things 1  Trouble relaxing 0  Restless 0  Easily annoyed or irritable 2  Afraid - awful might happen 1  Total GAD 7 Score 5    Assessment & Plan:   Problem List Items Addressed This Visit    Lower abdominal pain    Symptoms improved with 5 days of cipro abx but sounds like never fully resolved. She did have E coli UTI last month. Will rpt UA today. labwork  last month not consistent with diverticulitis.       Relevant Orders  POCT Urinalysis Dipstick (Automated) (Completed)   Low serum vitamin B12    Continue monthly B12 shot, received today.       Relevant Medications   cyanocobalamin ((VITAMIN B-12)) injection 1,000 mcg (Completed)   Anxiety - Primary    Chronic, largely improved on current regimen - continue fluvoxamine 25mg  daily. No further tremors. Advised change to AM dosing for possible insomnia contribution.          Meds ordered this encounter  Medications  . cyanocobalamin ((VITAMIN B-12)) injection 1,000 mcg   Orders Placed This Encounter  Procedures  . POCT Urinalysis Dipstick (Automated)    Follow up plan: Return in about 3 months (around 05/31/2018) for annual exam, prior fasting for blood work, medicare wellness visit.  [name], MD

## 2018-03-01 ENCOUNTER — Ambulatory Visit (INDEPENDENT_AMBULATORY_CARE_PROVIDER_SITE_OTHER): Payer: Medicare Other | Admitting: Family Medicine

## 2018-03-01 ENCOUNTER — Encounter: Payer: Self-pay | Admitting: Family Medicine

## 2018-03-01 VITALS — BP 122/62 | HR 70 | Temp 98.4°F | Ht 68.0 in | Wt 138.5 lb

## 2018-03-01 DIAGNOSIS — R1030 Lower abdominal pain, unspecified: Secondary | ICD-10-CM | POA: Diagnosis not present

## 2018-03-01 DIAGNOSIS — F419 Anxiety disorder, unspecified: Secondary | ICD-10-CM | POA: Diagnosis not present

## 2018-03-01 DIAGNOSIS — E538 Deficiency of other specified B group vitamins: Secondary | ICD-10-CM | POA: Diagnosis not present

## 2018-03-01 LAB — POC URINALSYSI DIPSTICK (AUTOMATED)
Blood, UA: NEGATIVE
Glucose, UA: NEGATIVE
Ketones, UA: NEGATIVE
Leukocytes, UA: NEGATIVE
Nitrite, UA: NEGATIVE
Protein, UA: NEGATIVE
Spec Grav, UA: 1.025 (ref 1.010–1.025)
Urobilinogen, UA: 0.2 E.U./dL
pH, UA: 5.5 (ref 5.0–8.0)

## 2018-03-01 MED ORDER — CYANOCOBALAMIN 1000 MCG/ML IJ SOLN
1000.0000 ug | Freq: Once | INTRAMUSCULAR | Status: AC
  Administered 2018-03-01: 1000 ug via INTRAMUSCULAR

## 2018-03-01 NOTE — Patient Instructions (Addendum)
B12 shot today Return monthly for B12 shots (nurse visit).  Return in 3 months for medicare wellness visit with Katha Cabal and physical with me.  Urinalysis today.

## 2018-03-01 NOTE — Assessment & Plan Note (Addendum)
Chronic, largely improved on current regimen - continue fluvoxamine 25mg  daily. No further tremors. Advised change to AM dosing for possible insomnia contribution.

## 2018-03-01 NOTE — Assessment & Plan Note (Signed)
Continue monthly B12 shot, received today.

## 2018-03-01 NOTE — Assessment & Plan Note (Addendum)
Symptoms improved with 5 days of cipro abx but sounds like never fully resolved. She did have E coli UTI last month. Will rpt UA today. labwork last month not consistent with diverticulitis.

## 2018-03-30 LAB — CUP PACEART REMOTE DEVICE CHECK
Battery Remaining Longevity: 140 mo
Battery Remaining Percentage: 95.5 %
Battery Voltage: 2.98 V
Brady Statistic RV Percent Paced: 99 %
Date Time Interrogation Session: 20191211085250
Implantable Lead Implant Date: 20160217
Implantable Lead Location: 753860
Implantable Lead Model: 1948
Implantable Pulse Generator Implant Date: 20160217
Lead Channel Impedance Value: 590 Ohm
Lead Channel Pacing Threshold Amplitude: 0.5 V
Lead Channel Pacing Threshold Pulse Width: 0.4 ms
Lead Channel Sensing Intrinsic Amplitude: 9.3 mV
Lead Channel Setting Pacing Amplitude: 0.75 V
Lead Channel Setting Pacing Pulse Width: 0.4 ms
Lead Channel Setting Sensing Sensitivity: 2.5 mV
Pulse Gen Model: 2240
Pulse Gen Serial Number: 3050195

## 2018-04-03 ENCOUNTER — Ambulatory Visit: Payer: Medicare Other

## 2018-04-11 ENCOUNTER — Ambulatory Visit: Payer: Medicare Other

## 2018-04-19 ENCOUNTER — Other Ambulatory Visit: Payer: Self-pay | Admitting: Cardiovascular Disease

## 2018-04-19 NOTE — Telephone Encounter (Signed)
Refill Request.  

## 2018-05-02 ENCOUNTER — Ambulatory Visit (INDEPENDENT_AMBULATORY_CARE_PROVIDER_SITE_OTHER): Payer: Medicare Other | Admitting: Family Medicine

## 2018-05-02 ENCOUNTER — Other Ambulatory Visit: Payer: Self-pay | Admitting: Family Medicine

## 2018-05-02 DIAGNOSIS — E538 Deficiency of other specified B group vitamins: Secondary | ICD-10-CM

## 2018-05-02 MED ORDER — CYANOCOBALAMIN 1000 MCG/ML IJ SOLN
1000.0000 ug | Freq: Once | INTRAMUSCULAR | Status: AC
  Administered 2018-05-02: 1000 ug via INTRAMUSCULAR

## 2018-05-02 NOTE — Progress Notes (Signed)
Per orders of Dr. Damita Dunnings (in absence for Dr Danise Mina), injection of B12 1030mcg given by [facility]. Patient tolerated injection well.

## 2018-05-04 ENCOUNTER — Other Ambulatory Visit: Payer: Self-pay | Admitting: Family Medicine

## 2018-05-15 ENCOUNTER — Ambulatory Visit (INDEPENDENT_AMBULATORY_CARE_PROVIDER_SITE_OTHER): Payer: Medicare Other | Admitting: *Deleted

## 2018-05-15 DIAGNOSIS — I5032 Chronic diastolic (congestive) heart failure: Secondary | ICD-10-CM

## 2018-05-15 DIAGNOSIS — I4821 Permanent atrial fibrillation: Secondary | ICD-10-CM

## 2018-05-16 LAB — CUP PACEART REMOTE DEVICE CHECK
Battery Remaining Longevity: 140 mo
Battery Remaining Percentage: 95.5 %
Battery Voltage: 2.98 V
Brady Statistic RV Percent Paced: 99 %
Date Time Interrogation Session: 20200311073846
Implantable Lead Implant Date: 20160217
Implantable Lead Location: 753860
Implantable Lead Model: 1948
Implantable Pulse Generator Implant Date: 20160217
Lead Channel Impedance Value: 600 Ohm
Lead Channel Pacing Threshold Amplitude: 0.5 V
Lead Channel Pacing Threshold Pulse Width: 0.4 ms
Lead Channel Sensing Intrinsic Amplitude: 12 mV
Lead Channel Setting Pacing Amplitude: 0.75 V
Lead Channel Setting Pacing Pulse Width: 0.4 ms
Lead Channel Setting Sensing Sensitivity: 2.5 mV
Pulse Gen Model: 2240
Pulse Gen Serial Number: 3050195

## 2018-05-21 ENCOUNTER — Telehealth: Payer: Self-pay | Admitting: Internal Medicine

## 2018-05-21 NOTE — Telephone Encounter (Signed)
Called and spoke to pt regarding appointment 05/28/18 for followup of AFIB perm, AV ablation and PM   Currently symptoms are stable   Discussed rescheduling of the appt 6*-8  weeks from now  Pt is agreeable and advised to call if interval problems

## 2018-05-21 NOTE — Telephone Encounter (Signed)
I spoke with the patient. Appt from 3/24 with SK rescheduled to 5/26 at 11:15 am with SK due to COVID-19.  The patient was agreeable.

## 2018-05-22 NOTE — Progress Notes (Signed)
Remote pacemaker transmission.   

## 2018-05-28 ENCOUNTER — Encounter: Payer: Medicare Other | Admitting: Internal Medicine

## 2018-06-03 ENCOUNTER — Ambulatory Visit: Payer: Medicare Other

## 2018-06-10 ENCOUNTER — Encounter: Payer: Medicare Other | Admitting: Family Medicine

## 2018-07-05 ENCOUNTER — Telehealth: Payer: Self-pay

## 2018-07-05 NOTE — Telephone Encounter (Signed)
Virtual Visit Pre-Appointment Phone Call  Confirm consent - "In the setting of the current Covid19 crisis, you are scheduled for a (phone or video) visit with your provider on (date) at (time).  Just as we do with many in-office visits, in order for you to participate in this visit, we must obtain consent.  If you'd like, I can send this to your mychart (if signed up) or email for you to review.  Otherwise, I can obtain your verbal consent now.  All virtual visits are billed to your insurance company just like a normal visit would be.  By agreeing to a virtual visit, we'd like you to understand that the technology does not allow for your provider to perform an examination, and thus may limit your provider's ability to fully assess your condition. If your provider identifies any concerns that need to be evaluated in person, we will make arrangements to do so.  Finally, though the technology is pretty good, we cannot assure that it will always work on either your or our end, and in the setting of a video visit, we may have to convert it to a phone-only visit.  In either situation, we cannot ensure that we have a secure connection.  Are you willing to proceed?"  YES  Advise patient to be prepared - "Two hours prior to your appointment, go ahead and check your blood pressure, pulse, oxygen saturation, and your weight (if you have the equipment to check those) and write them all down. When your visit starts, your provider will ask you for this information. If you have an Apple Watch or Kardia device, please plan to have heart rate information ready on the day of your appointment. Please have a pen and paper handy nearby the day of the visit as well."  Inform patient they will receive a phone call 15 minutes prior to their appointment time (may be from unknown caller ID) so they should be prepared to answer    [place] has been deemed a candidate for a follow-up tele-health  visit to limit community exposure during the Covid-19 pandemic. I spoke with the patient via phone to ensure availability of phone/video source, confirm preferred email & phone number, and discuss instructions and expectations.  I reminded MEKIA DIPINTO to be prepared with any vital sign and/or heart rhythm information that could potentially be obtained via home monitoring, at the time of her visit. I reminded MAKENLI DERSTINE to expect a phone call prior to her visit.  Alba Destine, RMA 07/05/2018 10:16 AM       FULL LENGTH CONSENT FOR TELE-HEALTH VISIT   I hereby voluntarily request, consent and authorize [facility] and its employed or contracted physicians, physician assistants, nurse practitioners or other licensed health care professionals (the Practitioner), to provide me with telemedicine health care services (the Services") as deemed necessary by the treating Practitioner. I acknowledge and consent to receive the Services by the Practitioner via telemedicine. I understand that the telemedicine visit will involve communicating with the Practitioner through live audiovisual communication technology and the disclosure of certain medical information by electronic transmission. I acknowledge that I have been given the opportunity to request an in-person assessment or other available alternative prior to the telemedicine visit and am voluntarily participating in the telemedicine visit.  I understand that I have the right to withhold or withdraw my consent to the use of telemedicine in the course of my care at  any time, without affecting my right to future care or treatment, and that the Practitioner or I may terminate the telemedicine visit at any time. I understand that I have the right to inspect all information obtained and/or recorded in the course of the telemedicine visit and may receive copies of available information for a reasonable fee.  I understand that some of the potential risks  of receiving the Services via telemedicine include:   Delay or interruption in medical evaluation due to technological equipment failure or disruption;  Information transmitted may not be sufficient (e.g. poor resolution of images) to allow for appropriate medical decision making by the Practitioner; and/or   In rare instances, security protocols could fail, causing a breach of personal health information.  Furthermore, I acknowledge that it is my responsibility to provide information about my medical history, conditions and care that is complete and accurate to the best of my ability. I acknowledge that Practitioner's advice, recommendations, and/or decision may be based on factors not within their control, such as incomplete or inaccurate data provided by me or distortions of diagnostic images or specimens that may result from electronic transmissions. I understand that the practice of medicine is not an exact science and that Practitioner makes no warranties or guarantees regarding treatment outcomes. I acknowledge that I will receive a copy of this consent concurrently upon execution via email to the email address I last provided but may also request a printed copy by calling the office of [place].    I understand that my insurance will be billed for this visit.   I have read or had this consent read to me.  I understand the contents of this consent, which adequately explains the benefits and risks of the Services being provided via telemedicine.   I have been provided ample opportunity to ask questions regarding this consent and the Services and have had my questions answered to my satisfaction.  I give my informed consent for the services to be provided through the use of telemedicine in my medical care  By participating in this telemedicine visit I agree to the above.

## 2018-07-16 ENCOUNTER — Telehealth (INDEPENDENT_AMBULATORY_CARE_PROVIDER_SITE_OTHER): Payer: Medicare Other | Admitting: Internal Medicine

## 2018-07-16 ENCOUNTER — Other Ambulatory Visit: Payer: Self-pay

## 2018-07-16 VITALS — BP 105/56 | HR 71 | Ht 68.0 in | Wt 134.4 lb

## 2018-07-16 DIAGNOSIS — I4821 Permanent atrial fibrillation: Secondary | ICD-10-CM | POA: Diagnosis not present

## 2018-07-16 DIAGNOSIS — I428 Other cardiomyopathies: Secondary | ICD-10-CM

## 2018-07-16 DIAGNOSIS — I5032 Chronic diastolic (congestive) heart failure: Secondary | ICD-10-CM

## 2018-07-16 DIAGNOSIS — I442 Atrioventricular block, complete: Secondary | ICD-10-CM

## 2018-07-16 DIAGNOSIS — Z950 Presence of cardiac pacemaker: Secondary | ICD-10-CM

## 2018-07-16 NOTE — Patient Instructions (Signed)
Medication Instructions:  - Your physician recommends that you continue on your current medications as directed. Please refer to the Current Medication list given to you today.  If you need a refill on your cardiac medications before your next appointment, please call your pharmacy.   Lab work: - none ordered  If you have labs (blood work) drawn today and your tests are completely normal, you will receive your results only by: Marland Kitchen MyChart Message (if you have MyChart) OR . A paper copy in the mail If you have any lab test that is abnormal or we need to change your treatment, we will call you to review the results.  Testing/Procedures: - Your physician has requested that you have an echocardiogram in 2 months. Echocardiography is a painless test that uses sound waves to create images of your heart. It provides your doctor with information about the size and shape of your heart and how well your heart's chambers and valves are working. This procedure takes approximately one hour. There are no restrictions for this procedure.   Follow-Up: At [facility], you and your health needs are our priority.  As part of our continuing mission to provide you with exceptional heart care, we have created designated Provider Care Teams.  These Care Teams include your primary Cardiologist (physician) and Advanced Practice Providers (APPs -  Physician Assistants and Nurse Practitioners) who all work together to provide you with the care you need, when you need it.  You will need a follow up appointment in 1 year with Dr. [name] (May 2021).   . Please call our office 2 months in advance to schedule this appointment.  (call in early March 2021 to schedule)   Remote monitoring is used to monitor your Pacemaker of ICD from home. This monitoring reduces the number of office visits required to check your device to one time per year. It allows [place] to keep an eye on the functioning of your device to ensure it is working  properly. You are scheduled for a device check from home on 08/14/2018. You may send your transmission at any time that day. If you have a wireless device, the transmission will be sent automatically. After your physician reviews your transmission, you will receive a postcard with your next transmission date.   Any Other Special Instructions Will Be Listed Below (If Applicable). - N/A    Echocardiogram An echocardiogram is a procedure that uses painless sound waves (ultrasound) to produce an image of the heart. Images from an echocardiogram can provide important information about:  Signs of coronary artery disease (CAD).  Aneurysm detection. An aneurysm is a weak or damaged part of an artery wall that bulges out from the normal force of blood pumping through the body.  Heart size and shape. Changes in the size or shape of the heart can be associated with certain conditions, including heart failure, aneurysm, and CAD.  Heart muscle function.  Heart valve function.  Signs of a past heart attack.  Fluid buildup around the heart.  Thickening of the heart muscle.  A tumor or infectious growth around the heart valves. Tell a health care provider about:  Any allergies you have.  All medicines you are taking, including vitamins, herbs, eye drops, creams, and over-the-counter medicines.  Any blood disorders you have.  Any surgeries you have had.  Any medical conditions you have.  Whether you are pregnant or may be pregnant. What are the risks? Generally, this is a safe procedure. However, problems  may occur, including:  Allergic reaction to dye (contrast) that may be used during the procedure. What happens before the procedure? No specific preparation is needed. You may eat and drink normally. What happens during the procedure?   An IV tube may be inserted into one of your veins.  You may receive contrast through this tube. A contrast is an injection that improves the  quality of the pictures from your heart.  A gel will be applied to your chest.  A wand-like tool (transducer) will be moved over your chest. The gel will help to transmit the sound waves from the transducer.  The sound waves will harmlessly bounce off of your heart to allow the heart images to be captured in real-time motion. The images will be recorded on a computer. The procedure may vary among health care providers and hospitals. What happens after the procedure?  You may return to your normal, everyday life, including diet, activities, and medicines, unless your health care provider tells you not to do that. Summary  An echocardiogram is a procedure that uses painless sound waves (ultrasound) to produce an image of the heart.  Images from an echocardiogram can provide important information about the size and shape of your heart, heart muscle function, heart valve function, and fluid buildup around your heart.  You do not need to do anything to prepare before this procedure. You may eat and drink normally.  After the echocardiogram is completed, you may return to your normal, everyday life, unless your health care provider tells you not to do that. This information is not intended to replace advice given to you by your health care provider. Make sure you discuss any questions you have with your health care provider. Document Released: 02/18/2000 Document Revised: 03/25/2016 Document Reviewed: 03/25/2016 Elsevier Interactive Patient Education  2019 [employer].

## 2018-07-16 NOTE — Progress Notes (Signed)
Electrophysiology TeleHealth Note   Due to national recommendations of social distancing due to COVID 19, an audio/video telehealth visit is felt to be most appropriate for this patient at this time.  See MyChart message from today for the patient's consent to telehealth for [facility].   Date:  07/16/2018   ID:  Sierra Kaiser, DOB May 15, 1933, MRN [id number]  Location: patient's home  Provider location: [address] [place]  Evaluation Performed: Follow-up visit  PCP:  [name], MD  Cardiologist:   TG  Electrophysiologist:  SK   Chief Complaint:  Atrial fibrillation AV ablation chronic dyspnea   History of Present Illness:    Sierra Kaiser is a 85 y.o. female who presents via audio/video conferencing for a telehealth visit today.  Since last being seen in our clinic, the patient reports chronic stable SOB and she still works in garden  The patient denies chest pain, nocturnal dyspnea, orthopnea or peripheral edema.  There have been no palpitations, lightheadedness or syncope.    DATE TEST EF   3/16 Echo   55-65% MR mod  4/17 Echo   55-65 % MR mild-mod  6/18 Echo  EF 45-50% MR mild-mod  1/19 Echo  EF 45-50% MR mild-mod   Pulm notes reviewed 2019 -- mild COPD   The patient denies symptoms of fevers, chills, cough, or new SOB worrisome for COVID 19.    Past Medical History:  Diagnosis Date  . Anxiety   . CAD (coronary artery disease)   . CHF (congestive heart failure) ([place])   . Esophageal reflux   . GI bleeding 2012   during EGD necessitating open surgery  . Hiatal hernia   . History of blood transfusion   . History of diverticulitis   . History of UTI   . HTN (hypertension)   . Hyperlipidemia   . Meniere's disease 1970s  . Mitral regurgitation   . Osteopenia 12/11/2015   T score -1.2 femur, -2.0 spine (12/2015)  . Permanent atrial fibrillation    a. permanent b. s/p PVI RFA at [facility] 10/14 c. failed amio (neuro toxicity) and Tikosyn  d. 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 PUD (peptic ulcer disease)   . Raynaud's disease   . Raynaud's disease   . SNHL (sensorineural hearing loss)    right ear  . Syncopal episodes   . Tinnitus   . Tinnitus   . Ulcerative colitis ([place])    per prior records  . Urinary incontinence, urge     Past Surgical History:  Procedure Laterality Date  . APPENDECTOMY  1973  . AV NODE ABLATION N/A 05/06/2014   Procedure: AV NODE ABLATION;  Surgeon: [name], MD;  Location: [facility] CATH LAB;  Service: Cardiovascular;  Laterality: N/A;  . CARDIAC CATHETERIZATION  2014   [facility]  . CARDIAC ELECTROPHYSIOLOGY STUDY AND ABLATION    . CHOLECYSTECTOMY  2010  . COLONOSCOPY  2010   polyps  . COLONOSCOPY WITH PROPOFOL N/A 08/29/2017   TAs, no f/u recommended [facility], Benay Pike, MD)  . ESOPHAGOGASTRODUODENOSCOPY (EGD) WITH PROPOFOL N/A 08/29/2017   benign biopsies [facility], Benay Pike, MD)  . HEMORRHOID SURGERY    . LAPAROTOMY  2012   EGD biopsy led to bleeding - needed laparotomy to stop bleed (Dr Jimmye Norman at [facility])  . PERMANENT PACEMAKER INSERTION N/A 04/22/2014   STJ single chamber pacemaker implanted by Dr [name]  . TRANSESOPHAGEAL  ECHOCARDIOGRAM WITH CARDIOVERSION  2014   [facility]  . UPPER GI ENDOSCOPY  2012   with polypectomy  . VAGINAL HYSTERECTOMY  1973   elective; ovaries remained    Current Outpatient Medications  Medication Sig Dispense Refill  . cyanocobalamin (,VITAMIN B-12,) 1000 MCG/ML injection Inject 1 mL (1,000 mcg total) into the muscle every 30 (thirty) days.    . diphenhydrAMINE-zinc acetate (BENADRYL) cream Apply 1 application topically 2 (two) times daily as needed for itching.    . fluticasone (FLONASE) 50 MCG/ACT nasal spray SPRAY 2 SPRAYS INTO EACH NOSTRIL EVERY DAY 16 g 3  . omeprazole (PRILOSEC) 40 MG capsule TAKE 1 CAPSULE DAILY 90 capsule 0  . XARELTO 15 MG TABS tablet TAKE 1 TABLET DAILY WITH SUPPER 90 tablet 1  .  Ferrous Sulfate (IRON) 325 (65 Fe) MG TABS Take 1 tablet (325 mg total) by mouth every other day. (Patient not taking: Reported on 07/16/2018)    . FIBER PO Take 1 tablet by mouth daily. 325 mg    . furosemide (LASIX) 40 MG tablet TAKE 1 TABLET DAILY (Patient not taking: Reported on 07/16/2018) 90 tablet 2  . Polyethyl Glycol-Propyl Glycol (SYSTANE OP) Place 1 drop into both eyes daily as needed (dry eyes).    . rosuvastatin (CRESTOR) 20 MG tablet TAKE 1 TABLET DAILY (Patient not taking: Reported on 07/16/2018) 30 tablet 0  . spironolactone (ALDACTONE) 25 MG tablet Take 25 mg by mouth daily.    . vitamin B-12 (CYANOCOBALAMIN) 1000 MCG tablet Take 1,000 mcg by mouth daily.     No current facility-administered medications for this visit.     Allergies:   Gadolinium derivatives; Contrast media [iodinated diagnostic agents]; Metronidazole; Sertraline; and Diltiazem hcl   Social History:  The patient  reports that she has never smoked. She has never used smokeless tobacco. She reports current alcohol use of about 1.0 - 2.0 standard drinks of alcohol per week. She reports that she does not use drugs.   Family History:  The patient's   family history includes Alcohol abuse in her brother; Arrhythmia in her mother; Breast cancer (age of onset: 19) in her sister; Cancer in her brother and sister; Diabetes in her mother and son; Heart failure in her brother and brother; Hypertension in her mother; Stroke in her brother.   ROS:  Please see the history of present illness.   All other systems are personally reviewed and negative.    Exam:    Vital Signs:  BP (!) 105/56 (BP Location: Left Arm, Patient Position: Sitting, Cuff Size: Normal)   Pulse 71   Ht 5\' 8"  (1.727 m)   Wt 134 lb 6.4 oz (61 kg)   BMI 20.44 kg/m  *      Labs/Other Tests and Data Reviewed:    Recent Labs: 01/28/2018: ALT 9; BUN 20; Creatinine, Ser 1.20; Hemoglobin 15.4; Platelets 179.0; Potassium 4.6; Sodium 142   Personally  reviewed    Wt Readings from Last 3 Encounters:  07/16/18 134 lb 6.4 oz (61 kg)  03/01/18 138 lb 8 oz (62.8 kg)  01/28/18 135 lb (61.2 kg)     Other studies personally reviewed: Additional studies/ records that were reviewed today include: As above   Last device remote is reviewed from [place] PDF dated *3/20  which reveals normal device function,   arrhythmias - none      ASSESSMENT & PLAN:    Atrial fibrillation  Dyspnea on exertion  Complete heart block status post AV  ablation  HFpEF   Normal>> 45-50%  LV function   MR mod  Anticoagulation   Cardiomyopathy  Mild   Will recheck echo in about 3 mnths given chronic RV pacing and the modest decrease in LV function previously noted  On Anticoagulation;  No bleeding issues   Has stopped all her meds x Rivaroxaban;  No clear indication for statins or diuretics; no symptoms for the latter and the former for primary prevention   Dyspnea chronic, but no worse  Asked about neuro notes and [facility]   >> [facility] for dementia      COVID 19 screen The patient denies symptoms of COVID 19 at this time.  The importance of social distancing was discussed today.  Follow-up:  74m Next remote: As Scheduled   Current medicines are reviewed at length with the patient today.   The patient does not have concerns regarding her medicines.  The following changes were made today:  none  Labs/ tests ordered today include:   No orders of the defined types were placed in this encounter.   Future tests ( post COVID )  Echo in 2 months  Patient Risk:  after full review of this patients clinical status, I feel that they are at moderate risk at this time.  Today, I have spent 12  minutes with the patient with telehealth technology discussing the above.  Signed, Virl Axe, MD  07/16/2018 12:03 PM     [place] [address] [place] [address] [phone] (office) [phone] (fax)

## 2018-07-18 ENCOUNTER — Telehealth: Payer: Self-pay | Admitting: Family Medicine

## 2018-07-18 NOTE — Telephone Encounter (Signed)
Pt said it is OK to leave a detailed msg on her voicemail.

## 2018-07-18 NOTE — Telephone Encounter (Signed)
Pt called regarding B-12 inj. Last was given 2/27 and she wants to know if she still needs these and if she needs anymore. She is feeling fine and has not been leaving the house. She had a virtual appt with Dr [name] and vitals abd weight were all good. Pt is requesting a call back.

## 2018-07-18 NOTE — Telephone Encounter (Signed)
Her levels did drop when we did oral replacement.  I do recommend continue monthly B12 shots.

## 2018-07-19 NOTE — Telephone Encounter (Signed)
Left message on vm per dpr relaying Dr. G's message.  

## 2018-07-25 ENCOUNTER — Ambulatory Visit (INDEPENDENT_AMBULATORY_CARE_PROVIDER_SITE_OTHER): Payer: Medicare Other

## 2018-07-25 DIAGNOSIS — E538 Deficiency of other specified B group vitamins: Secondary | ICD-10-CM | POA: Diagnosis not present

## 2018-07-25 MED ORDER — CYANOCOBALAMIN 1000 MCG/ML IJ SOLN
1000.0000 ug | Freq: Once | INTRAMUSCULAR | Status: AC
  Administered 2018-07-25: 1000 ug via INTRAMUSCULAR

## 2018-07-25 NOTE — Progress Notes (Signed)
Per orders of Dr. Einar Pheasant, injection of Vitamin B12  given by Diamond Nickel, RN.  Administered to Right Deltoid. Patient informed to call back and schedule her next injection in 1 month.    Patient tolerated injection well.

## 2018-08-14 ENCOUNTER — Ambulatory Visit (INDEPENDENT_AMBULATORY_CARE_PROVIDER_SITE_OTHER): Payer: Medicare Other | Admitting: *Deleted

## 2018-08-14 DIAGNOSIS — I495 Sick sinus syndrome: Secondary | ICD-10-CM

## 2018-08-14 DIAGNOSIS — I442 Atrioventricular block, complete: Secondary | ICD-10-CM | POA: Diagnosis not present

## 2018-08-14 LAB — CUP PACEART REMOTE DEVICE CHECK
Battery Remaining Longevity: 139 mo
Battery Remaining Percentage: 95.5 %
Battery Voltage: 2.98 V
Brady Statistic RV Percent Paced: 99 %
Date Time Interrogation Session: 20200610061842
Implantable Lead Implant Date: 20160217
Implantable Lead Location: 753860
Implantable Lead Model: 1948
Implantable Pulse Generator Implant Date: 20160217
Lead Channel Impedance Value: 560 Ohm
Lead Channel Pacing Threshold Amplitude: 0.5 V
Lead Channel Pacing Threshold Pulse Width: 0.4 ms
Lead Channel Sensing Intrinsic Amplitude: 10.4 mV
Lead Channel Setting Pacing Amplitude: 0.75 V
Lead Channel Setting Pacing Pulse Width: 0.4 ms
Lead Channel Setting Sensing Sensitivity: 2.5 mV
Pulse Gen Model: 2240
Pulse Gen Serial Number: 3050195

## 2018-08-23 ENCOUNTER — Encounter: Payer: Self-pay | Admitting: Cardiology

## 2018-08-23 NOTE — Progress Notes (Signed)
Remote pacemaker transmission.   

## 2018-08-28 ENCOUNTER — Ambulatory Visit (INDEPENDENT_AMBULATORY_CARE_PROVIDER_SITE_OTHER): Payer: Medicare Other

## 2018-08-28 DIAGNOSIS — E538 Deficiency of other specified B group vitamins: Secondary | ICD-10-CM | POA: Diagnosis not present

## 2018-08-28 MED ORDER — CYANOCOBALAMIN 1000 MCG/ML IJ SOLN
1000.0000 ug | Freq: Once | INTRAMUSCULAR | Status: AC
  Administered 2018-08-28: 1000 ug via INTRAMUSCULAR

## 2018-08-28 NOTE — Progress Notes (Signed)
Per orders of Dr. Gutierrez, injection of vit B12 given by Kimley Apsey. Patient tolerated injection well.  

## 2018-09-02 ENCOUNTER — Telehealth: Payer: Self-pay | Admitting: *Deleted

## 2018-09-02 NOTE — Telephone Encounter (Signed)
[facility] requesting call back from patient.  Dr. [name] requested that we reach out to pt regarding billing questions.

## 2018-09-03 NOTE — Telephone Encounter (Signed)
Pt returned [place] phone call. Pt states that when she talked to insurance they states that the $100 dollars was to go towards the deductible. Once the deductible is paid then she do not think she will get a bill. She said to let the nurse know that she do not need a call back unless it is about her health. She was pleasant to talk to.

## 2018-09-16 ENCOUNTER — Telehealth: Payer: Self-pay

## 2018-09-16 NOTE — Telephone Encounter (Signed)

## 2018-09-17 ENCOUNTER — Ambulatory Visit (INDEPENDENT_AMBULATORY_CARE_PROVIDER_SITE_OTHER): Payer: Medicare Other

## 2018-09-17 ENCOUNTER — Other Ambulatory Visit: Payer: Self-pay

## 2018-09-17 DIAGNOSIS — I428 Other cardiomyopathies: Secondary | ICD-10-CM | POA: Diagnosis not present

## 2018-09-17 DIAGNOSIS — I5032 Chronic diastolic (congestive) heart failure: Secondary | ICD-10-CM

## 2018-09-17 DIAGNOSIS — Z950 Presence of cardiac pacemaker: Secondary | ICD-10-CM | POA: Diagnosis not present

## 2018-09-27 ENCOUNTER — Telehealth: Payer: Self-pay | Admitting: Internal Medicine

## 2018-09-27 NOTE — Telephone Encounter (Signed)
Notes recorded by [name], MD on 09/27/2018 at 4:04 PM EDT  Please Inform Patient Echo showed  Stable heart muscle function

## 2018-09-27 NOTE — Telephone Encounter (Signed)
Attempted to call the patient. No answer- I left a message for the patient to call back.  

## 2018-10-01 NOTE — Telephone Encounter (Signed)
I spoke with the patient regarding her echo results.  

## 2018-10-09 ENCOUNTER — Other Ambulatory Visit: Payer: Self-pay | Admitting: Family Medicine

## 2018-10-09 DIAGNOSIS — E538 Deficiency of other specified B group vitamins: Secondary | ICD-10-CM

## 2018-10-09 DIAGNOSIS — N183 Chronic kidney disease, stage 3 unspecified: Secondary | ICD-10-CM

## 2018-10-09 DIAGNOSIS — E785 Hyperlipidemia, unspecified: Secondary | ICD-10-CM

## 2018-10-09 DIAGNOSIS — E611 Iron deficiency: Secondary | ICD-10-CM

## 2018-10-10 ENCOUNTER — Ambulatory Visit: Payer: Medicare Other

## 2018-10-10 ENCOUNTER — Other Ambulatory Visit (INDEPENDENT_AMBULATORY_CARE_PROVIDER_SITE_OTHER): Payer: Medicare Other

## 2018-10-10 DIAGNOSIS — E785 Hyperlipidemia, unspecified: Secondary | ICD-10-CM

## 2018-10-10 DIAGNOSIS — E611 Iron deficiency: Secondary | ICD-10-CM

## 2018-10-10 DIAGNOSIS — N183 Chronic kidney disease, stage 3 unspecified: Secondary | ICD-10-CM

## 2018-10-10 DIAGNOSIS — E538 Deficiency of other specified B group vitamins: Secondary | ICD-10-CM | POA: Diagnosis not present

## 2018-10-10 LAB — CBC WITH DIFFERENTIAL/PLATELET
Basophils Absolute: 0.1 10*3/uL (ref 0.0–0.1)
Basophils Relative: 1.7 % (ref 0.0–3.0)
Eosinophils Absolute: 0.2 10*3/uL (ref 0.0–0.7)
Eosinophils Relative: 3.7 % (ref 0.0–5.0)
HCT: 39.7 % (ref 36.0–46.0)
Hemoglobin: 13.7 g/dL (ref 12.0–15.0)
Lymphocytes Relative: 36.8 % (ref 12.0–46.0)
Lymphs Abs: 1.8 10*3/uL (ref 0.7–4.0)
MCHC: 34.4 g/dL (ref 30.0–36.0)
MCV: 94.1 fl (ref 78.0–100.0)
Monocytes Absolute: 0.5 10*3/uL (ref 0.1–1.0)
Monocytes Relative: 9.3 % (ref 3.0–12.0)
Neutro Abs: 2.4 10*3/uL (ref 1.4–7.7)
Neutrophils Relative %: 48.5 % (ref 43.0–77.0)
Platelets: 221 10*3/uL (ref 150.0–400.0)
RBC: 4.22 Mil/uL (ref 3.87–5.11)
RDW: 13.6 % (ref 11.5–15.5)
WBC: 5 10*3/uL (ref 4.0–10.5)

## 2018-10-10 LAB — FERRITIN: Ferritin: 16 ng/mL (ref 10.0–291.0)

## 2018-10-10 LAB — COMPREHENSIVE METABOLIC PANEL
ALT: 8 U/L (ref 0–35)
AST: 12 U/L (ref 0–37)
Albumin: 4.3 g/dL (ref 3.5–5.2)
Alkaline Phosphatase: 55 U/L (ref 39–117)
BUN: 33 mg/dL — ABNORMAL HIGH (ref 6–23)
CO2: 27 mEq/L (ref 19–32)
Calcium: 9.7 mg/dL (ref 8.4–10.5)
Chloride: 110 mEq/L (ref 96–112)
Creatinine, Ser: 1.09 mg/dL (ref 0.40–1.20)
GFR: 47.88 mL/min — ABNORMAL LOW (ref >60.00)
Glucose, Bld: 83 mg/dL (ref 70–99)
Potassium: 4.8 mEq/L (ref 3.5–5.1)
Sodium: 143 mEq/L (ref 135–145)
Total Bilirubin: 0.5 mg/dL (ref 0.2–1.2)
Total Protein: 6.2 g/dL (ref 6.0–8.3)

## 2018-10-10 LAB — LIPID PANEL
Cholesterol: 212 mg/dL — ABNORMAL HIGH (ref 0–200)
HDL: 50 mg/dL (ref >39.00)
LDL Cholesterol: 141 mg/dL — ABNORMAL HIGH (ref 0–99)
NonHDL: 161.52
Total CHOL/HDL Ratio: 4
Triglycerides: 102 mg/dL (ref 0.0–149.0)
VLDL: 20.4 mg/dL (ref 0.0–40.0)

## 2018-10-10 LAB — IBC PANEL
Iron: 109 ug/dL (ref 42–145)
Saturation Ratios: 32.3 % (ref 20.0–50.0)
Transferrin: 241 mg/dL (ref 212.0–360.0)

## 2018-10-10 LAB — VITAMIN B12: Vitamin B-12: 274 pg/mL (ref 211–911)

## 2018-10-18 ENCOUNTER — Other Ambulatory Visit: Payer: Self-pay

## 2018-10-18 ENCOUNTER — Ambulatory Visit (INDEPENDENT_AMBULATORY_CARE_PROVIDER_SITE_OTHER): Payer: Medicare Other | Admitting: Family Medicine

## 2018-10-18 ENCOUNTER — Encounter: Payer: Self-pay | Admitting: Family Medicine

## 2018-10-18 VITALS — BP 120/62 | HR 73 | Temp 97.8°F | Ht 68.0 in | Wt 132.4 lb

## 2018-10-18 DIAGNOSIS — R0609 Other forms of dyspnea: Secondary | ICD-10-CM

## 2018-10-18 DIAGNOSIS — F419 Anxiety disorder, unspecified: Secondary | ICD-10-CM

## 2018-10-18 DIAGNOSIS — H8109 Meniere's disease, unspecified ear: Secondary | ICD-10-CM

## 2018-10-18 DIAGNOSIS — E785 Hyperlipidemia, unspecified: Secondary | ICD-10-CM

## 2018-10-18 DIAGNOSIS — N183 Chronic kidney disease, stage 3 unspecified: Secondary | ICD-10-CM

## 2018-10-18 DIAGNOSIS — M8580 Other specified disorders of bone density and structure, unspecified site: Secondary | ICD-10-CM

## 2018-10-18 DIAGNOSIS — K219 Gastro-esophageal reflux disease without esophagitis: Secondary | ICD-10-CM

## 2018-10-18 DIAGNOSIS — Z0000 Encounter for general adult medical examination without abnormal findings: Secondary | ICD-10-CM

## 2018-10-18 DIAGNOSIS — E611 Iron deficiency: Secondary | ICD-10-CM

## 2018-10-18 DIAGNOSIS — I5032 Chronic diastolic (congestive) heart failure: Secondary | ICD-10-CM

## 2018-10-18 DIAGNOSIS — I4821 Permanent atrial fibrillation: Secondary | ICD-10-CM

## 2018-10-18 DIAGNOSIS — Z7189 Other specified counseling: Secondary | ICD-10-CM | POA: Insufficient documentation

## 2018-10-18 DIAGNOSIS — H9192 Unspecified hearing loss, left ear: Secondary | ICD-10-CM

## 2018-10-18 DIAGNOSIS — K5909 Other constipation: Secondary | ICD-10-CM

## 2018-10-18 DIAGNOSIS — E538 Deficiency of other specified B group vitamins: Secondary | ICD-10-CM | POA: Diagnosis not present

## 2018-10-18 MED ORDER — CYANOCOBALAMIN 1000 MCG/ML IJ SOLN
1000.0000 ug | Freq: Once | INTRAMUSCULAR | Status: AC
  Administered 2018-10-18: 1000 ug via INTRAMUSCULAR

## 2018-10-18 MED ORDER — ROSUVASTATIN CALCIUM 20 MG PO TABS: 20.0000 mg | ORAL_TABLET | ORAL | 3 refills | Status: DC

## 2018-10-18 MED ORDER — IRON 325 (65 FE) MG PO TABS: 1.0000 | ORAL_TABLET | ORAL | Status: DC

## 2018-10-18 NOTE — Addendum Note (Signed)
Addended by: Brenton Grills on: 9/73/5329 92:42 PM   Modules accepted: Orders

## 2018-10-18 NOTE — Assessment & Plan Note (Signed)
No recent trouble.  

## 2018-10-18 NOTE — Assessment & Plan Note (Signed)
Stable off omeprazole.

## 2018-10-18 NOTE — Assessment & Plan Note (Signed)
euvolemic now off diuretics.

## 2018-10-18 NOTE — Assessment & Plan Note (Signed)
Continue monthly shots - overdue for this.

## 2018-10-18 NOTE — Patient Instructions (Addendum)
Call [facility] to schedule mammogram [phone]) If interested, check with pharmacy about new 2 shot shingles series (shingrix).  Check at home on advanced directives, health care power of attorney and living will. Bring [place] a copy to update your chart.  Restart crestor every other day.  Restart iron every other day.  Continue vitamin B12 shots - another one today.  We will refer you to audiology Return as needed or in 1 year for next physical.  Health Maintenance After Age 29 After age 31, you are at a higher risk for certain long-term diseases and infections as well as injuries from falls. Falls are a major cause of broken bones and head injuries in people who are older than age 58. Getting regular preventive care can help to keep you healthy and well. Preventive care includes getting regular testing and making lifestyle changes as recommended by your health care provider. Talk with your health care provider about:  Which screenings and tests you should have. A screening is a test that checks for a disease when you have no symptoms.  A diet and exercise plan that is right for you. What should I know about screenings and tests to prevent falls? Screening and testing are the best ways to find a health problem early. Early diagnosis and treatment give you the best chance of managing medical conditions that are common after age 60. Certain conditions and lifestyle choices may make you more likely to have a fall. Your health care provider may recommend:  Regular vision checks. Poor vision and conditions such as cataracts can make you more likely to have a fall. If you wear glasses, make sure to get your prescription updated if your vision changes.  Medicine review. Work with your health care provider to regularly review all of the medicines you are taking, including over-the-counter medicines. Ask your health care provider about any side effects that may make you more likely to have  a fall. Tell your health care provider if any medicines that you take make you feel dizzy or sleepy.  Osteoporosis screening. Osteoporosis is a condition that causes the bones to get weaker. This can make the bones weak and cause them to break more easily.  Blood pressure screening. Blood pressure changes and medicines to control blood pressure can make you feel dizzy.  Strength and balance checks. Your health care provider may recommend certain tests to check your strength and balance while standing, walking, or changing positions.  Foot health exam. Foot pain and numbness, as well as not wearing proper footwear, can make you more likely to have a fall.  Depression screening. You may be more likely to have a fall if you have a fear of falling, feel emotionally low, or feel unable to do activities that you used to do.  Alcohol use screening. Using too much alcohol can affect your balance and may make you more likely to have a fall. What actions can I take to lower my risk of falls? General instructions  Talk with your health care provider about your risks for falling. Tell your health care provider if: ? You fall. Be sure to tell your health care provider about all falls, even ones that seem minor. ? You feel dizzy, sleepy, or off-balance.  Take over-the-counter and prescription medicines only as told by your health care provider. These include any supplements.  Eat a healthy diet and maintain a healthy weight. A healthy diet includes low-fat dairy products, low-fat (lean) meats, and  fiber from whole grains, beans, and lots of fruits and vegetables. Home safety  Remove any tripping hazards, such as rugs, cords, and clutter.  Install safety equipment such as grab bars in bathrooms and safety rails on stairs.  Keep rooms and walkways well-lit. Activity   Follow a regular exercise program to stay fit. This will help you maintain your balance. Ask your health care provider what types of  exercise are appropriate for you.  If you need a cane or walker, use it as recommended by your health care provider.  Wear supportive shoes that have nonskid soles. Lifestyle  Do not drink alcohol if your health care provider tells you not to drink.  If you drink alcohol, limit how much you have: ? 0-1 drink a day for women. ? 0-2 drinks a day for men.  Be aware of how much alcohol is in your drink. In the [place] one drink equals one typical bottle of beer (12 oz), one-half glass of wine (5 oz), or one shot of hard liquor (1 oz).  Do not use any products that contain nicotine or tobacco, such as cigarettes and e-cigarettes. If you need help quitting, ask your health care provider. Summary  Having a healthy lifestyle and getting preventive care can help to protect your health and wellness after age 57.  Screening and testing are the best way to find a health problem early and help you avoid having a fall. Early diagnosis and treatment give you the best chance for managing medical conditions that are more common for people who are older than age 28.  Falls are a major cause of broken bones and head injuries in people who are older than age 16. Take precautions to prevent a fall at home.  Work with your health care provider to learn what changes you can make to improve your health and wellness and to prevent falls. This information is not intended to replace advice given to you by your health care provider. Make sure you discuss any questions you have with your health care provider. Document Released: 01/03/2017 Document Revised: 06/13/2018 Document Reviewed: 01/03/2017 Elsevier Patient Education  2020 [employer].

## 2018-10-18 NOTE — Assessment & Plan Note (Signed)
Chronic, LDL deteriorated off crestor. Reviewed lipids with patient. She would like to restart crestor at QOD dosing given fmhx stroke (sister just had a stroke)

## 2018-10-18 NOTE — Assessment & Plan Note (Signed)

## 2018-10-18 NOTE — Assessment & Plan Note (Signed)
Advanced directive discussion: has at home. Sister (Stephanie) would be HCPOA. Will bring me copy.  

## 2018-10-18 NOTE — Assessment & Plan Note (Addendum)
Stable period off med. Scores high on PHQ9 and GAD7 however patient states she feels she's doing well off medication, declines treatment at this time. Will continue to monitor.

## 2018-10-18 NOTE — Progress Notes (Signed)
This visit was conducted in person.  BP 120/62 (BP Location: Left Arm, Patient Position: Sitting, Cuff Size: Normal)   Pulse 73   Temp 97.8 F (36.6 C) (Temporal)   Ht 5\' 8"  (1.727 m)   Wt 132 lb 7 oz (60.1 kg)   SpO2 97%   BMI 20.14 kg/m    CC: AMW Subjective:    Patient ID: [name], female    DOB: October 24, 1933, 85 y.o.   MRN: [id number]  HPI: TREINA ARSCOTT is a 85 y.o. female presenting on 10/18/2018 for Medicare Wellness   Did not see health advisor.  She has been working in the yard and building bird houses.  Has stopped most of her meds including spironolactone, crestor, omeprazole and lasix.    Hearing Screening   125Hz  250Hz  500Hz  1000Hz  2000Hz  3000Hz  4000Hz  6000Hz  8000Hz   Right ear:   0 0 0  0    Left ear:   25 40 40  0    Comments: Heard practice tone on 40 dBHL   Visual Acuity Screening   Right eye Left eye Both eyes  Without correction: 20/25 20/70 20/30   With correction:     saw ENT [facility] - h/o meniere's. Would be interested in audiology referral if necessary.     Office Visit from 10/18/2018 in [facility]  PHQ-2 Total Score  4      Fall Risk  10/18/2018 01/17/2017 01/12/2017 11/24/2016 10/09/2016  Falls in the past year? 1 No No No No  Number falls in past yr: 0 - - - -  Injury with Fall? 1 - - - -      Preventative: Colonoscopy 2010 - polyps  COLONOSCOPY WITH PROPOFOL 08/29/2017 - TAs, no f/u recommended Alice Reichert, Benay Pike, MD) Mammogram 12/2015. She does check breast checks at home without concerns.  DEXA 12/2015 - osteopenia. Discussed calcium and vit D dosing as well as regular weight bearing exercise.  Flu shot yearly  pneumovax 2012, prevnar 2016  Tetanus - unsure  zostavax ~2014  shingrix - discussed Advanced directive discussion: has at home. Sister [name]) would be HCPOA. Will bring me copy.  Seat belt use discussed Sunscreen use discussed. Wants mole on R groin checked.  Non smoker Alcohol - 1 wine glass  every few weeks Dentist q6 mo Eye exam - has not seen Bowel - no constipation Bladder - some urge incontinence noted when first standing   Lives with husband.  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 Interim medical history since our last visit reviewed. Allergies and medications reviewed and updated. Outpatient Medications Prior to Visit  Medication Sig Dispense Refill  . BIOTIN PO Take by mouth daily.    . cyanocobalamin (,VITAMIN B-12,) 1000 MCG/ML injection Inject 1 mL (1,000 mcg total) into the muscle every 30 (thirty) days.    . diphenhydrAMINE-zinc acetate (BENADRYL) cream Apply 1 application topically 2 (two) times daily as needed for itching.    . fluticasone (FLONASE) 50 MCG/ACT nasal spray SPRAY 2 SPRAYS INTO EACH NOSTRIL EVERY DAY 16 g 3  . Polyethyl Glycol-Propyl Glycol (SYSTANE OP) Place 1 drop into both eyes daily as needed (dry eyes).    Alveda Reasons 15 MG TABS tablet TAKE 1 TABLET  DAILY WITH SUPPER 90 tablet 1  . omeprazole (PRILOSEC) 40 MG capsule TAKE 1 CAPSULE DAILY (Patient taking differently: Take 40 mg by mouth daily. As needed) 90 capsule 0  . Ferrous Sulfate (IRON) 325 (65 Fe) MG TABS Take 1 tablet (325 mg total) by mouth every other day. (Patient not taking: Reported on 07/16/2018)    . FIBER PO Take 1 tablet by mouth daily. 325 mg    . furosemide (LASIX) 40 MG tablet TAKE 1 TABLET DAILY (Patient not taking: Reported on 07/16/2018) 90 tablet 2  . rosuvastatin (CRESTOR) 20 MG tablet TAKE 1 TABLET DAILY 30 tablet 0  . spironolactone (ALDACTONE) 25 MG tablet Take 25 mg by mouth daily.    . vitamin B-12 (CYANOCOBALAMIN) 1000 MCG tablet Take 1,000 mcg by mouth daily.     No facility-administered  medications prior to visit.      Per HPI unless specifically indicated in ROS section below Review of Systems  Constitutional: Negative for activity change, appetite change, chills, fatigue, fever and unexpected weight change.  HENT: Negative for hearing loss.   Eyes: Negative for visual disturbance.  Respiratory: Positive for shortness of breath (improving). Negative for cough, chest tightness and wheezing.   Cardiovascular: Negative for chest pain, palpitations and leg swelling.  Gastrointestinal: Negative for abdominal distention, abdominal pain, blood in stool, constipation, diarrhea, nausea and vomiting.  Genitourinary: Negative for difficulty urinating and hematuria.  Musculoskeletal: Negative for arthralgias, myalgias and neck pain.  Skin: Negative for rash.  Neurological: Positive for dizziness (rare). Negative for seizures, syncope and headaches.  Hematological: Negative for adenopathy. Does not bruise/bleed easily.  Psychiatric/Behavioral: Negative for dysphoric mood. The patient is not nervous/anxious.    Objective:    BP 120/62 (BP Location: Left Arm, Patient Position: Sitting, Cuff Size: Normal)   Pulse 73   Temp 97.8 F (36.6 C) (Temporal)   Ht 5\' 8"  (1.727 m)   Wt 132 lb 7 oz (60.1 kg)   SpO2 97%   BMI 20.14 kg/m   Wt Readings from Last 3 Encounters:  10/18/18 132 lb 7 oz (60.1 kg)  07/16/18 134 lb 6.4 oz (61 kg)  03/01/18 138 lb 8 oz (62.8 kg)    Physical Exam Vitals signs and nursing note reviewed.  Constitutional:      General: She is not in acute distress.    Appearance: Normal appearance. She is well-developed. She is not ill-appearing.  HENT:     Head: Normocephalic and atraumatic.     Right Ear: Hearing, tympanic membrane, ear canal and external ear normal. There is no impacted cerumen.     Left Ear: Hearing, tympanic membrane, ear canal and external ear normal. There is no impacted cerumen.     Nose: Nose normal.     Mouth/Throat:     Mouth: Mucous  membranes are moist.     Pharynx: Uvula midline. No oropharyngeal exudate or posterior oropharyngeal erythema.  Eyes:     General: No scleral icterus.    Extraocular Movements: Extraocular movements intact.     Conjunctiva/sclera: Conjunctivae normal.     Pupils: Pupils are equal, round, and reactive to light.  Neck:     Musculoskeletal: Normal range of motion and neck supple.     Vascular: No carotid bruit.  Cardiovascular:     Rate and Rhythm: Normal rate and regular rhythm.     Pulses: Normal pulses.          Radial pulses are 2+ on the right side and  2+ on the left side.     Heart sounds: Normal heart sounds. No murmur.  Pulmonary:     Effort: Pulmonary effort is normal. No respiratory distress.     Breath sounds: Normal breath sounds. No wheezing, rhonchi or rales.  Abdominal:     General: Abdomen is flat. Bowel sounds are normal. There is no distension.     Palpations: Abdomen is soft. There is no mass.     Tenderness: There is no abdominal tenderness. There is no guarding or rebound.     Hernia: No hernia is present.  Musculoskeletal: Normal range of motion.     Right lower leg: No edema.     Left lower leg: No edema.  Lymphadenopathy:     Cervical: No cervical adenopathy.  Skin:    General: Skin is warm and dry.     Findings: No rash.  Neurological:     General: No focal deficit present.     Mental Status: She is alert and oriented to person, place, and time.     Comments:  CN grossly intact, station and gait intact Recall 3/3 Calculation 5/5 [id number]  Psychiatric:        Mood and Affect: Mood normal.        Behavior: Behavior normal.        Thought Content: Thought content normal.        Judgment: Judgment normal.       Results for orders placed or performed in visit on 10/10/18  IBC panel  Result Value Ref Range   Iron 109 42 - 145 ug/dL   Transferrin 241.0 212.0 - 360.0 mg/dL   Saturation Ratios 32.3 20.0 - 50.0 %  Ferritin  Result Value Ref Range    Ferritin 16.0 10.0 - 291.0 ng/mL  Vitamin B12  Result Value Ref Range   Vitamin B-12 274 211 - 911 pg/mL  Comprehensive metabolic panel  Result Value Ref Range   Sodium 143 135 - 145 mEq/L   Potassium 4.8 3.5 - 5.1 mEq/L   Chloride 110 96 - 112 mEq/L   CO2 27 19 - 32 mEq/L   Glucose, Bld 83 70 - 99 mg/dL   BUN 33 (H) 6 - 23 mg/dL   Creatinine, Ser 1.09 0.40 - 1.20 mg/dL   Total Bilirubin 0.5 0.2 - 1.2 mg/dL   Alkaline Phosphatase 55 39 - 117 U/L   AST 12 0 - 37 U/L   ALT 8 0 - 35 U/L   Total Protein 6.2 6.0 - 8.3 g/dL   Albumin 4.3 3.5 - 5.2 g/dL   Calcium 9.7 8.4 - 10.5 mg/dL   GFR 47.88 (L) >60.00 mL/min  Lipid panel  Result Value Ref Range   Cholesterol 212 (H) 0 - 200 mg/dL   Triglycerides 102.0 0.0 - 149.0 mg/dL   HDL 50.00 >39.00 mg/dL   VLDL 20.4 0.0 - 40.0 mg/dL   LDL Cholesterol 141 (H) 0 - 99 mg/dL   Total CHOL/HDL Ratio 4    NonHDL 161.52   CBC with Differential/Platelet  Result Value Ref Range   WBC 5.0 4.0 - 10.5 K/uL   RBC 4.22 3.87 - 5.11 Mil/uL   Hemoglobin 13.7 12.0 - 15.0 g/dL   HCT 39.7 36.0 - 46.0 %   MCV 94.1 78.0 - 100.0 fl   MCHC 34.4 30.0 - 36.0 g/dL   RDW 13.6 11.5 - 15.5 %   Platelets 221.0 150.0 - 400.0 K/uL   Neutrophils Relative % 48.5 43.0 -  77.0 %   Lymphocytes Relative 36.8 12.0 - 46.0 %   Monocytes Relative 9.3 3.0 - 12.0 %   Eosinophils Relative 3.7 0.0 - 5.0 %   Basophils Relative 1.7 0.0 - 3.0 %   Neutro Abs 2.4 1.4 - 7.7 K/uL   Lymphs Abs 1.8 0.7 - 4.0 K/uL   Monocytes Absolute 0.5 0.1 - 1.0 K/uL   Eosinophils Absolute 0.2 0.0 - 0.7 K/uL   Basophils Absolute 0.1 0.0 - 0.1 K/uL   Depression screen [facility] 2/9 10/18/2018 01/17/2017 01/12/2017 11/24/2016 10/09/2016  Decreased Interest 2 0 0 2 3  Down, Depressed, Hopeless 2 0 0 0 1  PHQ - 2 Score 4 0 0 2 4  Altered sleeping 2 0 1 1 1   Tired, decreased energy 3 0 1 2 3   Change in appetite 3 0 1 2 0  Feeling bad or failure about yourself  1 0 0 1 2  Trouble concentrating 1 0 0 1 1   Moving slowly or fidgety/restless 2 0 1 2 2   Suicidal thoughts 0 0 0 0 0  PHQ-9 Score 16 0 4 11 13   Difficult doing work/chores - Not difficult at all Somewhat difficult Somewhat difficult Extremely dIfficult    GAD 7 : Generalized Anxiety Score 10/18/2018 03/01/2018  Nervous, Anxious, on Edge 1 1  Control/stop worrying 1 0  Worry too much - different things 1 1  Trouble relaxing 1 0  Restless 1 0  Easily annoyed or irritable 1 2  Afraid - awful might happen 1 1  Total GAD 7 Score 7 5     Assessment & Plan:   Problem List Items Addressed This Visit    Osteopenia    Encouraged regular weight bearing exercise program.       Meniere's disease   Medicare annual wellness visit, subsequent - Primary    I have personally reviewed the Medicare Annual Wellness questionnaire and have noted 1. The patient's medical and social history 2. Their use of alcohol, tobacco or illicit drugs 3. Their current medications and supplements 4. The patient's functional ability including ADL's, fall risks, home safety risks and hearing or visual impairment. Cognitive function has been assessed and addressed as indicated.  5. Diet and physical activity 6. Evidence for depression or mood disorders The patients weight, height, BMI have been recorded in the chart. I have made referrals, counseling and provided education to the patient based on review of the above and I have provided the pt with a written personalized care plan for preventive services. Provider list updated.. See scanned questionairre as needed for further documentation. Reviewed preventative protocols and updated unless pt declined.       Low serum vitamin B12    Continue monthly shots - overdue for this.       Iron deficiency    Ferritin remains low normal otherwise iron levels ok. Will restart ferrous sulfate QOD dosing.       Hyperlipidemia    Chronic, LDL deteriorated off crestor. Reviewed lipids with patient. She would like to  restart crestor at QOD dosing given fmhx stroke (sister just had a stroke)       Relevant Medications   rosuvastatin (CRESTOR) 20 MG tablet   Health maintenance examination    Preventative protocols reviewed and updated unless pt declined. Discussed healthy diet and lifestyle.       GERD (gastroesophageal reflux disease)    Stable off omeprazole.       CKD (chronic kidney disease)  stage 3, GFR 30-59 ml/min (HCC)    Reviewed with patient, encouraged good hydration status.       Chronic dyspnea    Stable period - improved with b12 shot and iron infusion      Chronic diastolic CHF (congestive heart failure) ([place])    euvolemic now off diuretics.       Relevant Medications   rosuvastatin (CRESTOR) 20 MG tablet   Chronic constipation    No recent trouble.       Atrial fibrillation, permanent    Appreciate cards care - on xarelto. Sounds regular today.       Relevant Medications   rosuvastatin (CRESTOR) 20 MG tablet   Anxiety    Stable period off med. Scores high on PHQ9 and GAD7 however patient states she feels she's doing well off medication, declines treatment at this time. Will continue to monitor.       Advanced care planning/counseling discussion    Advanced directive discussion: has at home. Sister [name]) would be HCPOA. Will bring me copy.        Other Visit Diagnoses    Hearing loss of left ear, unspecified hearing loss type       Relevant Orders   Ambulatory referral to Audiology       Meds ordered this encounter  Medications  . rosuvastatin (CRESTOR) 20 MG tablet    Sig: Take 1 tablet (20 mg total) by mouth every other day.    Dispense:  45 tablet    Refill:  3  . Ferrous Sulfate (IRON) 325 (65 Fe) MG TABS    Sig: Take 1 tablet (325 mg total) by mouth every other day.   Orders Placed This Encounter  Procedures  . Ambulatory referral to Audiology    Referral Priority:   Routine    Referral Type:   Audiology Exam    Referral Reason:    Specialty Services Required    Number of Visits Requested:   1    Follow up plan: Return in about 1 year (around 10/18/2019) for annual exam, prior fasting for blood work, medicare wellness visit.  [name], MD

## 2018-10-18 NOTE — Assessment & Plan Note (Signed)
Reviewed with patient, encouraged good hydration status.

## 2018-10-18 NOTE — Assessment & Plan Note (Signed)
Ferritin remains low normal otherwise iron levels ok. Will restart ferrous sulfate QOD dosing.

## 2018-10-18 NOTE — Assessment & Plan Note (Signed)
Stable period - improved with b12 shot and iron infusion

## 2018-10-18 NOTE — Assessment & Plan Note (Addendum)
Appreciate cards care - on xarelto. Sounds regular today.

## 2018-10-18 NOTE — Assessment & Plan Note (Signed)
Preventative protocols reviewed and updated unless pt declined. Discussed healthy diet and lifestyle.  

## 2018-10-18 NOTE — Assessment & Plan Note (Signed)
Encouraged regular weight bearing exercise program.

## 2018-10-28 ENCOUNTER — Other Ambulatory Visit: Payer: Self-pay

## 2018-10-28 ENCOUNTER — Encounter: Payer: Self-pay | Admitting: Family Medicine

## 2018-10-28 ENCOUNTER — Ambulatory Visit (INDEPENDENT_AMBULATORY_CARE_PROVIDER_SITE_OTHER): Payer: Medicare Other | Admitting: Family Medicine

## 2018-10-28 ENCOUNTER — Telehealth: Payer: Self-pay

## 2018-10-28 VITALS — BP 122/62 | HR 70 | Temp 97.6°F | Ht 68.0 in | Wt 132.1 lb

## 2018-10-28 DIAGNOSIS — L299 Pruritus, unspecified: Secondary | ICD-10-CM | POA: Diagnosis not present

## 2018-10-28 NOTE — Progress Notes (Signed)
This visit was conducted in person.  BP 122/62 (BP Location: Right Arm, Patient Position: Sitting, Cuff Size: Normal)   Pulse 70   Temp 97.6 F (36.4 C) (Temporal)   Ht 5\' 8"  (1.727 m)   Wt 132 lb 1 oz (59.9 kg)   SpO2 98%   BMI 20.08 kg/m    CC: itching Subjective:    Patient ID: Sierra Kaiser, female    DOB: August 13, 1933, 85 y.o.   MRN: [id number]  HPI: Sierra Kaiser is a 85 y.o. female presenting on 10/28/2018 for Pruritus (C/o itching all over.  Started yrs ago, worsened last 2 wks.  Worse at night. Tried Benadryl and anti-itch gel/cream. )   Significant stressors recently. 2 best friends died this week.   10+ yr h/o itching, states started after receiving IV contrast dye for MRI - broke out in hives. Saw [facility] dermatology without finding cause. Itch did improve but never resolved. Itching throughout body above the waist, worse at night. Over last 3-4 weeks worse. No rash to itch.   She treats with benadryl 25mg  4 a day with benefit.  She has also been using topical benadryl menthol and HC creams.   No new lotions, detergents, soaps or shampoos. No new medicines or supplements No new foods.  Fmhx itching as well.      Relevant past medical, surgical, family and social history reviewed and updated as indicated. Interim medical history since our last visit reviewed. Allergies and medications reviewed and updated. Outpatient Medications Prior to Visit  Medication Sig Dispense Refill  . XARELTO 15 MG TABS tablet TAKE 1 TABLET DAILY WITH SUPPER 90 tablet 1  . BIOTIN PO Take by mouth daily.    . cyanocobalamin (,VITAMIN B-12,) 1000 MCG/ML injection Inject 1 mL (1,000 mcg total) into the muscle every 30 (thirty) days.    . diphenhydrAMINE-zinc acetate (BENADRYL) cream Apply 1 application topically 2 (two) times daily as needed for itching.    . Ferrous Sulfate (IRON) 325 (65 Fe) MG TABS Take 1 tablet (325 mg total) by mouth every other day.    . fluticasone (FLONASE) 50  MCG/ACT nasal spray SPRAY 2 SPRAYS INTO EACH NOSTRIL EVERY DAY 16 g 3  . Polyethyl Glycol-Propyl Glycol (SYSTANE OP) Place 1 drop into both eyes daily as needed (dry eyes).    . rosuvastatin (CRESTOR) 20 MG tablet Take 1 tablet (20 mg total) by mouth every other day. 45 tablet 3   No facility-administered medications prior to visit.      Per HPI unless specifically indicated in ROS section below Review of Systems Objective:    BP 122/62 (BP Location: Right Arm, Patient Position: Sitting, Cuff Size: Normal)   Pulse 70   Temp 97.6 F (36.4 C) (Temporal)   Ht 5\' 8"  (1.727 m)   Wt 132 lb 1 oz (59.9 kg)   SpO2 98%   BMI 20.08 kg/m   Wt Readings from Last 3 Encounters:  10/28/18 132 lb 1 oz (59.9 kg)  10/18/18 132 lb 7 oz (60.1 kg)  07/16/18 134 lb 6.4 oz (61 kg)    Physical Exam Vitals signs and nursing note reviewed.  Constitutional:      Appearance: Normal appearance. She is not ill-appearing.  Skin:    General: Skin is warm and dry.     Coloration: Skin is not jaundiced.     Findings: No erythema or rash.     Comments: Some excoriations to R forearm and R posterior upper  back  Neurological:     Mental Status: She is alert.  Psychiatric:        Mood and Affect: Mood normal.        Behavior: Behavior normal.       Results for orders placed or performed in visit on 10/10/18  IBC panel  Result Value Ref Range   Iron 109 42 - 145 ug/dL   Transferrin 241.0 212.0 - 360.0 mg/dL   Saturation Ratios 32.3 20.0 - 50.0 %  Ferritin  Result Value Ref Range   Ferritin 16.0 10.0 - 291.0 ng/mL  Vitamin B12  Result Value Ref Range   Vitamin B-12 274 211 - 911 pg/mL  Comprehensive metabolic panel  Result Value Ref Range   Sodium 143 135 - 145 mEq/L   Potassium 4.8 3.5 - 5.1 mEq/L   Chloride 110 96 - 112 mEq/L   CO2 27 19 - 32 mEq/L   Glucose, Bld 83 70 - 99 mg/dL   BUN 33 (H) 6 - 23 mg/dL   Creatinine, Ser 1.09 0.40 - 1.20 mg/dL   Total Bilirubin 0.5 0.2 - 1.2 mg/dL    Alkaline Phosphatase 55 39 - 117 U/L   AST 12 0 - 37 U/L   ALT 8 0 - 35 U/L   Total Protein 6.2 6.0 - 8.3 g/dL   Albumin 4.3 3.5 - 5.2 g/dL   Calcium 9.7 8.4 - 10.5 mg/dL   GFR 47.88 (L) >60.00 mL/min  Lipid panel  Result Value Ref Range   Cholesterol 212 (H) 0 - 200 mg/dL   Triglycerides 102.0 0.0 - 149.0 mg/dL   HDL 50.00 >39.00 mg/dL   VLDL 20.4 0.0 - 40.0 mg/dL   LDL Cholesterol 141 (H) 0 - 99 mg/dL   Total CHOL/HDL Ratio 4    NonHDL 161.52   CBC with Differential/Platelet  Result Value Ref Range   WBC 5.0 4.0 - 10.5 K/uL   RBC 4.22 3.87 - 5.11 Mil/uL   Hemoglobin 13.7 12.0 - 15.0 g/dL   HCT 39.7 36.0 - 46.0 %   MCV 94.1 78.0 - 100.0 fl   MCHC 34.4 30.0 - 36.0 g/dL   RDW 13.6 11.5 - 15.5 %   Platelets 221.0 150.0 - 400.0 K/uL   Neutrophils Relative % 48.5 43.0 - 77.0 %   Lymphocytes Relative 36.8 12.0 - 46.0 %   Monocytes Relative 9.3 3.0 - 12.0 %   Eosinophils Relative 3.7 0.0 - 5.0 %   Basophils Relative 1.7 0.0 - 3.0 %   Neutro Abs 2.4 1.4 - 7.7 K/uL   Lymphs Abs 1.8 0.7 - 4.0 K/uL   Monocytes Absolute 0.5 0.1 - 1.0 K/uL   Eosinophils Absolute 0.2 0.0 - 0.7 K/uL   Basophils Absolute 0.1 0.0 - 0.1 K/uL   Assessment & Plan:   Problem List Items Addressed This Visit    Pruritus - Primary    Reviewed recent normal CMP, CBC. Encouraged continued regular moisturizing of skin as well as discussed antihistamine use. She feels itching has worsened since restarting iron and crestor - will hold iron to see if any improvement. Discussed possibly adding gabapentin for itching if ongoing. She will update me with pruritis effect off iron.           No orders of the defined types were placed in this encounter.  No orders of the defined types were placed in this encounter.   Follow up plan: Return if symptoms worsen or fail to improve.  Garlon Hatchet  Danise Mina, MD

## 2018-10-28 NOTE — Telephone Encounter (Signed)
Called patient and scheduled appointment to see PCP today

## 2018-10-28 NOTE — Telephone Encounter (Signed)
Patient Name: Sierra Kaiser Gender: Female DOB: 1933-10-21 Age: 85 Y 37 M 3 D Return Phone Number: [phone] (Primary), [phone] (Secondary) Address: City/State/Zip: [place] [address] Client [facility] Night - Client Client Site [place] Physician [name] - MD Contact Type Call Who Is Calling Patient / Member / Family / Caregiver Call Type Triage / Clinical Caller Name Vickey Huger, FNP Relationship To Patient [place] Return Phone Number [phone] (Primary) Chief Complaint Itching Reason for Call Symptomatic / Request for [place] states she is with [facility] needing to talk to a nurse in regards to a patient who has been having itching for the last 2 weeks. States she has been taking 100mg  of Benadryl a day. Translation No Nurse Assessment Guidelines Guideline Title Affirmed Question Affirmed Notes Nurse Date/Time (Eastern Time) Disp. Time Eilene Ghazi Time) Disposition Final User 10/25/2018 6:06:15 PM Attempt made - message left Gigi Gin 10/25/2018 6:27:21 PM FINAL ATTEMPT MADE - message left Yes Adrian Blackwater, RN, [place] [place]

## 2018-10-28 NOTE — Assessment & Plan Note (Addendum)
Reviewed recent normal CMP, CBC. Encouraged continued regular moisturizing of skin as well as discussed antihistamine use. She feels itching has worsened since restarting iron and crestor - will hold iron to see if any improvement. Discussed possibly adding gabapentin for itching if ongoing. She will update me with pruritis effect off iron.

## 2018-10-28 NOTE — Patient Instructions (Addendum)
Stop iron to see if any improvement in itching.  Try claritin or alegra in place of benadryl.  If no better with this, let me know to consider a different itching medicine called gabapentin.

## 2018-11-13 ENCOUNTER — Ambulatory Visit (INDEPENDENT_AMBULATORY_CARE_PROVIDER_SITE_OTHER): Payer: Medicare Other | Admitting: *Deleted

## 2018-11-13 DIAGNOSIS — I4821 Permanent atrial fibrillation: Secondary | ICD-10-CM

## 2018-11-13 DIAGNOSIS — I442 Atrioventricular block, complete: Secondary | ICD-10-CM | POA: Diagnosis not present

## 2018-11-13 LAB — CUP PACEART REMOTE DEVICE CHECK
Battery Remaining Longevity: 139 mo
Battery Remaining Percentage: 95.5 %
Battery Voltage: 2.98 V
Brady Statistic RV Percent Paced: 99 %
Date Time Interrogation Session: 20200909070132
Implantable Lead Implant Date: 20160217
Implantable Lead Location: 753860
Implantable Lead Model: 1948
Implantable Pulse Generator Implant Date: 20160217
Lead Channel Impedance Value: 560 Ohm
Lead Channel Pacing Threshold Amplitude: 0.5 V
Lead Channel Pacing Threshold Pulse Width: 0.4 ms
Lead Channel Sensing Intrinsic Amplitude: 9.9 mV
Lead Channel Setting Pacing Amplitude: 0.75 V
Lead Channel Setting Pacing Pulse Width: 0.4 ms
Lead Channel Setting Sensing Sensitivity: 2.5 mV
Pulse Gen Model: 2240
Pulse Gen Serial Number: 3050195

## 2018-11-19 ENCOUNTER — Ambulatory Visit (INDEPENDENT_AMBULATORY_CARE_PROVIDER_SITE_OTHER): Payer: Medicare Other

## 2018-11-19 DIAGNOSIS — E538 Deficiency of other specified B group vitamins: Secondary | ICD-10-CM

## 2018-11-19 MED ORDER — CYANOCOBALAMIN 1000 MCG/ML IJ SOLN
1000.0000 ug | Freq: Once | INTRAMUSCULAR | Status: AC
  Administered 2018-11-19: 1000 ug via INTRAMUSCULAR

## 2018-11-19 NOTE — Progress Notes (Signed)
Pt given B12 injection in the right deltoid due to being in the car. Tolerated well.

## 2018-11-20 ENCOUNTER — Encounter: Payer: Self-pay | Admitting: Family Medicine

## 2018-11-21 ENCOUNTER — Telehealth: Payer: Self-pay | Admitting: Family Medicine

## 2018-11-21 DIAGNOSIS — L299 Pruritus, unspecified: Secondary | ICD-10-CM

## 2018-11-21 NOTE — Telephone Encounter (Signed)
Pt requests dermatology consult in [place] for her itchy, hard and numb right ring finger she has seen you for in the past.  Also, some of her toes are hard and numb.  She has been doing Benadryl PRN, baking soda and oatmeal and is ok until she can see dermatologist.

## 2018-11-25 NOTE — Addendum Note (Signed)
Addended by: [name] on: 11/25/2018 07:27 AM   Modules accepted: Orders

## 2018-11-25 NOTE — Telephone Encounter (Signed)
Referral placed for pruritis.

## 2018-11-28 NOTE — Progress Notes (Signed)
Remote pacemaker transmission.   

## 2018-12-24 ENCOUNTER — Ambulatory Visit (INDEPENDENT_AMBULATORY_CARE_PROVIDER_SITE_OTHER): Payer: Medicare Other

## 2018-12-24 DIAGNOSIS — Z23 Encounter for immunization: Secondary | ICD-10-CM

## 2018-12-24 DIAGNOSIS — E538 Deficiency of other specified B group vitamins: Secondary | ICD-10-CM

## 2018-12-24 MED ORDER — CYANOCOBALAMIN 1000 MCG/ML IJ SOLN
1000.0000 ug | Freq: Once | INTRAMUSCULAR | Status: AC
  Administered 2018-12-24: 1000 ug via INTRAMUSCULAR

## 2018-12-24 NOTE — Progress Notes (Signed)
Per orders of Dr. Danise Mina, injection of monthly B12given by Kris Mouton. Patient tolerated injection well.  Patient also received Flu vaccine today.

## 2019-01-28 ENCOUNTER — Ambulatory Visit: Payer: Medicare Other

## 2019-02-04 ENCOUNTER — Telehealth: Payer: Self-pay | Admitting: Cardiovascular Disease

## 2019-02-04 MED ORDER — RIVAROXABAN 15 MG PO TABS: ORAL_TABLET | ORAL | 1 refills | Status: DC

## 2019-02-04 NOTE — Telephone Encounter (Signed)
Xarelto 15mg  refill request received. Pt is 85 years old, weight-59.9kg, Crea-1.09 on 10/10/2018, last seen by Dr. [name] on 07/16/2018, Diagnosis-Afib, CrCl-36.68ml/min; Dose is appropriate based on dosing criteria. Will send in refill to requested pharmacy.

## 2019-02-04 NOTE — Telephone Encounter (Signed)
°*  STAT* If patient is at the pharmacy, call can be transferred to refill team.   1. Which medications need to be refilled? (please list name of each medication and dose if known)   xarelto 15 mg po q d   2. Which pharmacy/location (including street and city if local pharmacy) is medication to be sent to?  [facility]   3. Do they need a 30 day or 90 day supply?   [place]

## 2019-02-04 NOTE — Telephone Encounter (Signed)
Refill Request.  

## 2019-02-06 ENCOUNTER — Other Ambulatory Visit: Payer: Self-pay

## 2019-02-06 ENCOUNTER — Ambulatory Visit (INDEPENDENT_AMBULATORY_CARE_PROVIDER_SITE_OTHER): Payer: Medicare Other

## 2019-02-06 DIAGNOSIS — E538 Deficiency of other specified B group vitamins: Secondary | ICD-10-CM | POA: Diagnosis not present

## 2019-02-06 MED ORDER — CYANOCOBALAMIN 1000 MCG/ML IJ SOLN
1000.0000 ug | Freq: Once | INTRAMUSCULAR | Status: AC
  Administered 2019-02-06: 11:00:00 1000 ug via INTRAMUSCULAR

## 2019-02-06 NOTE — Progress Notes (Signed)
Pt received monthly B12 in Left Deltoid. Tolerated well.

## 2019-02-10 ENCOUNTER — Other Ambulatory Visit: Payer: Self-pay

## 2019-02-10 NOTE — Telephone Encounter (Signed)
Patient calling  States she spoke with [facility] and they have the prescription but they are unable to send it out until they receive an authorization Please review - patient says she will contact [facility] again tomorrow to check on status

## 2019-02-10 NOTE — Telephone Encounter (Signed)
*  STAT* If patient is at the pharmacy, call can be transferred to refill team.   1. Which medications need to be refilled? (please list name of each medication and dose if known) XARELTO  2. Which pharmacy/location (including street and city if local pharmacy) is medication to be sent to? [facility]  [address]  3. Do they need a 30 day or 90 day supply? [place] THEY WILL NOT HONOR HER REFILL. PLEASE CALL TO DISCUSS.

## 2019-02-10 NOTE — Telephone Encounter (Signed)
PA has been [facility] via [facility] to [facility]. Awaiting Approval.

## 2019-02-10 NOTE — Telephone Encounter (Signed)
Pt is aware that PA has been forwarded to [facility]. Awaiting at this time for approval. She is aware that I will return her call tomorrow if samples are needed until she is approved or once approval has been approved to notify her.  She agreed and is ok with waiting until tomorrow she has 2 days worth of Xarelto at this time and is aware that we will get her samples if needed.

## 2019-02-11 NOTE — Telephone Encounter (Signed)
Pt has been approved for Xarelto 15 mg tablet qd. 01/12/2019-02/11/2020.

## 2019-02-11 NOTE — Telephone Encounter (Signed)
Unable to leave message to let pt know she has been approved.

## 2019-02-12 ENCOUNTER — Ambulatory Visit (INDEPENDENT_AMBULATORY_CARE_PROVIDER_SITE_OTHER): Payer: Medicare Other | Admitting: *Deleted

## 2019-02-12 DIAGNOSIS — I442 Atrioventricular block, complete: Secondary | ICD-10-CM

## 2019-02-12 LAB — CUP PACEART REMOTE DEVICE CHECK
Battery Remaining Longevity: 139 mo
Battery Remaining Percentage: 95.5 %
Battery Voltage: 2.98 V
Brady Statistic RV Percent Paced: 99 %
Date Time Interrogation Session: 20201209020012
Implantable Lead Implant Date: 20160217
Implantable Lead Location: 753860
Implantable Lead Model: 1948
Implantable Pulse Generator Implant Date: 20160217
Lead Channel Impedance Value: 560 Ohm
Lead Channel Pacing Threshold Amplitude: 0.5 V
Lead Channel Pacing Threshold Pulse Width: 0.4 ms
Lead Channel Sensing Intrinsic Amplitude: 8.6 mV
Lead Channel Setting Pacing Amplitude: 0.75 V
Lead Channel Setting Pacing Pulse Width: 0.4 ms
Lead Channel Setting Sensing Sensitivity: 2.5 mV
Pulse Gen Model: 2240
Pulse Gen Serial Number: 3050195

## 2019-02-13 NOTE — Telephone Encounter (Signed)
Pt has been approved for Xarelto 15 mg tablet qd. 01/12/2019-02/11/2020.

## 2019-02-13 NOTE — Telephone Encounter (Signed)
February 10, 2019  3:11 PM Sierra Kaiser, CMA Note   PA has been submitted via fax to [facility]. Awaiting Approval.

## 2019-02-14 MED ORDER — RIVAROXABAN 15 MG PO TABS: ORAL_TABLET | ORAL | 1 refills | Status: DC

## 2019-02-18 NOTE — Progress Notes (Signed)
Virtual Visit via Video Note   This visit type was conducted due to national recommendations for restrictions regarding the COVID-19 Pandemic (e.g. social distancing) in an effort to limit this patient's exposure and mitigate transmission in our community.  Due to her co-morbid illnesses, this patient is at least at moderate risk for complications without adequate follow up.  This format is felt to be most appropriate for this patient at this time.  All issues noted in this document were discussed and addressed.  A limited physical exam was performed with this format.  Please refer to the patient's chart for her consent to telehealth for [facility].   I connected with  [name] on 02/19/19 by a video enabled telemedicine application and verified that I am speaking with the correct person using two identifiers. I discussed the limitations of evaluation and management by telemedicine. The patient expressed understanding and agreed to proceed.   Evaluation Performed:  Follow-up visit  Date:  02/19/2019   ID:  Devlyn, Khatoon 1933/06/03, MRN [id number]  Patient Location:  [place] [address]   Provider location:   [facility], [place] office  PCP:  [name], MD  Cardiologist:  [name] [facility]   Chief Complaint  Patient presents with  . other    Pt. c/o shortness of breath with activity. Meds reviewed by the pt. verbally.     History of Present Illness:    Sierra Kaiser is a 85 y.o. female who presents via audio/video conferencing for a telehealth visit today.   The patient does not symptoms concerning for COVID-19 infection (fever, chills, cough, or new SHORTNESS OF BREATH).   Patient has a past medical history of atrial fibrillation, previously followed at [facility],  long history of atrial fibrillation, trial on numerous medications.  Tikosyn Started 2012.Multaq.   atrial fibrillation ablation in October  2014. syncope in 2013, March 2015. 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  Stopped taking lasix months ago Building bird feeders for past time, not much walking Numb fingers   Other past medical history reviewed CT scan 09/29/2015 moderate diffuse aortic and iliac artery  Atherosclerosis Unable to exclude distal RCA coronary calcification   on anticoagulation, xarelto 15 mg daily  Echocardiogram March 2016 showing moderate MR, mildly elevated right heart pressures estimated 40 mmHg  Previously on metoprolol and tikosyn. Changed to amiodarone in preparation for cardioversion She underwent cardioversion 11/12/2013 which was successful but she reports going back to atrial fibrillation the next day   Lab work from [facility] 09/28/2012 showing creatinine 1.55, BUN 24 BNP 4800 EKG 09/28/2012 showing atrial fibrillation with RVR, rate 137 beats per minute followup EKG showing normal sinus rhythm with rate 82 beats per minute   Echocardiogram January 2011 was essentially normal. Repeat echocardiogram September 2014 again with normal LV function, normal study  Cardioversion may 2012 Cardioversion  12/06/2012 cardiac cath 05/17/2007 showing no significant CAD   Prior CV studies:   The following studies were reviewed  today:    Past Medical History:  Diagnosis Date  . Anxiety   . CAD (coronary artery disease)   . CHF (congestive heart failure) ([place])   . Esophageal reflux   . GI bleeding 2012   during EGD necessitating open surgery  . Hiatal hernia   . History of blood transfusion   . History of diverticulitis   . History of UTI   . HTN (hypertension)   . Hyperlipidemia   . Meniere's disease 1970s  . Mitral regurgitation   . Osteopenia 12/11/2015   T score -1.2 femur, -2.0 spine (12/2015)  . Permanent atrial fibrillation ([place])    a. permanent b. s/p PVI RFA at [facility] 10/14 c. failed amio (neuro toxicity) and Tikosyn d. 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 PUD (peptic ulcer disease)   . Raynaud's disease   . Raynaud's disease   . SNHL (sensorineural hearing loss)    right ear  . Syncopal episodes   . Tinnitus   . Tinnitus   . Ulcerative colitis ([place])    per prior records  . Urinary incontinence, urge    Past Surgical History:  Procedure Laterality Date  . APPENDECTOMY  1973  . AV NODE ABLATION N/A 05/06/2014   Procedure: AV NODE ABLATION;  Surgeon: [name], MD;  Location: [facility] CATH LAB;  Service: Cardiovascular;  Laterality: N/A;  . CARDIAC CATHETERIZATION  2014   [facility]  . CARDIAC ELECTROPHYSIOLOGY STUDY AND ABLATION    . CHOLECYSTECTOMY  2010  . COLONOSCOPY  2010   polyps  . COLONOSCOPY WITH PROPOFOL N/A 08/29/2017   TAs, no f/u recommended [facility], Benay Pike, MD)  . ESOPHAGOGASTRODUODENOSCOPY (EGD) WITH PROPOFOL N/A 08/29/2017   benign biopsies [facility], Benay Pike, MD)  . HEMORRHOID SURGERY    . LAPAROTOMY  2012   EGD biopsy led to bleeding - needed laparotomy to stop bleed ([facility] Jimmye Norman [facility])  . PERMANENT PACEMAKER INSERTION N/A 04/22/2014   STJ single chamber pacemaker implanted by Dr [name]  . TRANSESOPHAGEAL ECHOCARDIOGRAM WITH CARDIOVERSION  2014   [facility]  . UPPER GI ENDOSCOPY  2012   with polypectomy  . VAGINAL  HYSTERECTOMY  1973   elective; ovaries remained      Allergies:   Gadolinium derivatives, Contrast media [iodinated diagnostic agents], Metronidazole, Other, Sertraline, and Diltiazem hcl   Social History   Tobacco Use  . Smoking status: Never Smoker  . Smokeless tobacco: Never Used  Substance Use Topics  . Alcohol use: Yes    Alcohol/week: 1.0 - 2.0 standard drinks    Types: 1 - 2 Glasses of wine per week    Comment: infrequent mixed drinks  . Drug use: Never     Current Outpatient Medications on File Prior to Visit  Medication Sig Dispense Refill  . BIOTIN PO Take by mouth daily.    . cyanocobalamin (,VITAMIN B-12,) 1000 MCG/ML injection Inject 1 mL (1,000 mcg total) into the muscle every 30 (thirty) days.    . diphenhydrAMINE-zinc acetate (BENADRYL) cream Apply 1 application topically 2 (two) times daily as needed for itching.    . fluticasone (FLONASE) 50 MCG/ACT nasal spray SPRAY 2 SPRAYS INTO EACH NOSTRIL EVERY DAY 16 g 3  . furosemide (LASIX) 40 MG tablet Take 40 mg by mouth as needed.    . gabapentin (NEURONTIN)  100 MG capsule Take 100 mg by mouth at bedtime.     . meclizine (ANTIVERT) 25 MG tablet Take 25 mg by mouth 2 (two) times daily.     Marland Kitchen omeprazole (PRILOSEC) 40 MG capsule Take 40 mg by mouth daily.    [name] Glycol-Propyl Glycol (SYSTANE OP) Place 1 drop into both eyes daily as needed (dry eyes).    . Rivaroxaban (XARELTO) 15 MG TABS tablet TAKE 1 TABLET DAILY WITH SUPPER 90 tablet 1  . rosuvastatin (CRESTOR) 20 MG tablet Take 1 tablet (20 mg total) by mouth every other day. 45 tablet 3  . triamcinolone (KENALOG) 0.025 % ointment Apply 1 application topically 2 (two) times daily.      No current facility-administered medications on file prior to visit.     Family Hx: The patient's family history includes Alcohol abuse in her brother; Arrhythmia in her mother; Breast cancer (age of onset: 66) in her sister; Cancer in her brother; Diabetes in her mother and  son; Heart failure in her brother and brother; Hypertension in her mother; Stroke in her brother and sister.  ROS:   Please see the history of present illness.    Review of Systems  Constitutional: Negative.   HENT: Negative.   Respiratory: Positive for shortness of breath.   Cardiovascular: Negative.   Gastrointestinal: Negative.   Musculoskeletal: Negative.   Neurological: Negative.   Psychiatric/Behavioral: Negative.   All other systems reviewed and are negative.     Labs/Other Tests and Data Reviewed:    Recent Labs: 10/10/2018: ALT 8; BUN 33; Creatinine, Ser 1.09; Hemoglobin 13.7; Platelets 221.0; Potassium 4.8; Sodium 143   Recent Lipid Panel Lab Results  Component Value Date/Time   CHOL 212 (H) 10/10/2018 09:54 AM   CHOL 159 08/15/2016 11:14 AM   TRIG 102.0 10/10/2018 09:54 AM   HDL 50.00 10/10/2018 09:54 AM   HDL 57 08/15/2016 11:14 AM   CHOLHDL 4 10/10/2018 09:54 AM   LDLCALC 141 (H) 10/10/2018 09:54 AM   LDLCALC 73 08/15/2016 11:14 AM    Wt Readings from Last 3 Encounters:  02/19/19 133 lb (60.3 kg)  10/28/18 132 lb 1 oz (59.9 kg)  10/18/18 132 lb 7 oz (60.1 kg)     Exam:    Vital Signs: Vital signs may also be detailed in the HPI BP 120/62   Ht 5\' 8"  (1.727 m)   Wt 133 lb (60.3 kg)   SpO2 97%   BMI 20.22 kg/m   Wt Readings from Last 3 Encounters:  02/19/19 133 lb (60.3 kg)  10/28/18 132 lb 1 oz (59.9 kg)  10/18/18 132 lb 7 oz (60.1 kg)   Temp Readings from Last 3 Encounters:  10/28/18 97.6 F (36.4 C) (Temporal)  10/18/18 97.8 F (36.6 C) (Temporal)  03/01/18 98.4 F (36.9 C) (Oral)   BP Readings from Last 3 Encounters:  02/19/19 120/62  10/28/18 122/62  10/18/18 120/62   Pulse Readings from Last 3 Encounters:  10/28/18 70  10/18/18 73  07/16/18 71     Well nourished, well developed female in no acute distress. Constitutional:  oriented to person, place, and time. No distress.    ASSESSMENT & PLAN:    Problem List Items  Addressed This Visit      Cardiology Problems   Chronic diastolic CHF (congestive heart failure) (HCC)   Relevant Medications   furosemide (LASIX) 40 MG tablet   Atrial fibrillation, permanent (HCC)   Relevant Medications   furosemide (LASIX) 40  MG tablet   Complete heart block (HCC) - Primary   Relevant Medications   furosemide (LASIX) 40 MG tablet    Other Visit Diagnoses    Sinus node dysfunction (HCC)       Relevant Medications   furosemide (LASIX) 40 MG tablet   Cardiac pacemaker in situ       NICM (nonischemic cardiomyopathy) (HCC)       Relevant Medications   furosemide (LASIX) 40 MG tablet     SOB, chronic issue Chronic diastolic and systolic CHF Will place order for lung works,rehab Responded well after completing program at [facility] She would like to try this again Sedentary.deconditioned at baseline She will restart lasix 2-3x a week  Paced rhythm Followed by dr. [name], sinus node dysfunction The left ventricle has mildly reduced systolic function, with an ejection fraction of 45-50%.  Atrial fib On xarelto  Hyperlipidemia Will start zetia daily She is taking crestor QOD Goal LDL <70   COVID-19 Education: The signs and symptoms of COVID-19 were discussed with the patient and how to seek care for testing (follow up with PCP or arrange E-visit).  The importance of social distancing was discussed today.  Patient Risk:   After full review of this patients clinical status, I feel that they are at least moderate risk at this time.  Time:   Today, I have spent 25 minutes with the patient with telehealth technology discussing the cardiac and medical problems/diagnoses detailed above   Additional 10 min spent reviewing the chart prior to patient visit today   Medication Adjustments/Labs and Tests Ordered: Current medicines are reviewed at length with the patient today.  Concerns regarding medicines are outlined above.   Tests Ordered: No tests  ordered   Medication Changes: No changes made   Disposition: Follow-up in 12 months Alternate with dr. [name] every 6 months  Signed, Ida Rogue, MD  [place] Office [address] [place], [place], [place] [address]

## 2019-02-19 ENCOUNTER — Other Ambulatory Visit: Payer: Self-pay

## 2019-02-19 ENCOUNTER — Encounter: Payer: Self-pay | Admitting: Cardiovascular Disease

## 2019-02-19 ENCOUNTER — Telehealth (INDEPENDENT_AMBULATORY_CARE_PROVIDER_SITE_OTHER): Payer: Medicare Other | Admitting: Cardiovascular Disease

## 2019-02-19 VITALS — BP 120/62 | Ht 68.0 in | Wt 133.0 lb

## 2019-02-19 DIAGNOSIS — I4821 Permanent atrial fibrillation: Secondary | ICD-10-CM | POA: Diagnosis not present

## 2019-02-19 DIAGNOSIS — I5032 Chronic diastolic (congestive) heart failure: Secondary | ICD-10-CM | POA: Diagnosis not present

## 2019-02-19 DIAGNOSIS — I495 Sick sinus syndrome: Secondary | ICD-10-CM

## 2019-02-19 DIAGNOSIS — Z950 Presence of cardiac pacemaker: Secondary | ICD-10-CM | POA: Diagnosis not present

## 2019-02-19 DIAGNOSIS — I428 Other cardiomyopathies: Secondary | ICD-10-CM

## 2019-02-19 DIAGNOSIS — I442 Atrioventricular block, complete: Secondary | ICD-10-CM

## 2019-02-19 MED ORDER — EZETIMIBE 10 MG PO TABS: 10.0000 mg | ORAL_TABLET | Freq: Every day | ORAL | 3 refills | Status: DC

## 2019-02-19 NOTE — Patient Instructions (Addendum)
Lung works consult EF low/cardiomyopathy Shortness of breath, chronic diastoilic and systlic CHF  Medication Instructions:  Start zetia one a day for cholesterol (already called in)  If you need a refill on your cardiac medications before your next appointment, please call your pharmacy.    Lab work: No new labs needed   If you have labs (blood work) drawn today and your tests are completely normal, you will receive your results only by: Marland Kitchen MyChart Message (if you have MyChart) OR . A paper copy in the mail If you have any lab test that is abnormal or we need to change your treatment, we will call you to review the results.   Testing/Procedures: No new testing needed   Follow-Up: At [facility], you and your health needs are our priority.  As part of our continuing mission to provide you with exceptional heart care, we have created designated Provider Care Teams.  These Care Teams include your primary Cardiologist (physician) and Advanced Practice Providers (APPs -  Physician Assistants and Nurse Practitioners) who all work together to provide you with the care you need, when you need it.  . You will need a follow up appointment in 12 months .   Please call our office 2 months in advance to schedule this appointment.    . Providers on your designated Care Team:   . Murray Hodgkins, NP . Christell Faith, PA-C . Marrianne Mood, PA-C  Any Other Special Instructions Will Be Listed Below (If Applicable).  For educational health videos Log in to : [email or web address] Or : [email or web address], password : [email or web address]

## 2019-02-20 ENCOUNTER — Other Ambulatory Visit: Payer: Self-pay | Admitting: *Deleted

## 2019-02-20 ENCOUNTER — Telehealth: Payer: Self-pay | Admitting: Cardiovascular Disease

## 2019-02-20 DIAGNOSIS — I5032 Chronic diastolic (congestive) heart failure: Secondary | ICD-10-CM

## 2019-02-20 NOTE — Telephone Encounter (Signed)
Spoke with Nicki Reaper at [facility] who wanted to verify that he had the same allergy list as our office. Lists matched so he is going to fill Rx as directed.

## 2019-02-20 NOTE — Telephone Encounter (Signed)
Pt c/o medication issue:  1. Name of Medication: zetia   2. How are you currently taking this medication (dosage and times per day)? 10 mg po q d   3. Are you having a reaction (difficulty breathing--STAT)? No   4. What is your medication issue? Pharmacy wants to clarify allergies prior to filling

## 2019-03-03 ENCOUNTER — Telehealth: Payer: Self-pay | Admitting: *Deleted

## 2019-03-03 NOTE — Telephone Encounter (Signed)
Ok to take both crestor and zetia. They can work well together, just need to watch for muscle pain on combination.  Goal LDL <70.

## 2019-03-03 NOTE — Telephone Encounter (Signed)
Patient called stating that Dr. Danise Mina has her taking Crestor every other day. Patient stated that Dr. Rockey Situ has prescribed Zetia 10 mg. Patient stated that she read the printout that came with the Zetia and it showed if she is taking a statin drug like Crestor she needs to contact her doctor. Patient stated that she has not started taking the Zetia yet and wants to know if she is to continue the Crestor alone with the new medication that Dr. Rockey Situ gave her?

## 2019-03-03 NOTE — Telephone Encounter (Signed)
Spoke with pt relaying Dr. G's message. Pt verbalizes understanding.  

## 2019-03-12 ENCOUNTER — Other Ambulatory Visit: Payer: Self-pay | Admitting: Unknown Physician Specialty

## 2019-03-12 DIAGNOSIS — H8109 Meniere's disease, unspecified ear: Secondary | ICD-10-CM

## 2019-03-14 ENCOUNTER — Telehealth: Payer: Self-pay | Admitting: Internal Medicine

## 2019-03-14 NOTE — Telephone Encounter (Signed)
    [facility] calling for device manufacturer and year as well as any coding for device available for upcoming scan.  Please call Sierra Kaiser.    Please route to [place]

## 2019-03-14 NOTE — Telephone Encounter (Signed)
Attempted return call to [facility]. Office currently closed. Will try again next week.

## 2019-03-17 ENCOUNTER — Telehealth: Payer: Self-pay

## 2019-03-17 NOTE — Telephone Encounter (Signed)
Ok to schedule b12 shot in midst of covid vaccine.

## 2019-03-17 NOTE — Telephone Encounter (Signed)
Patient states she get's monthly B12's and she states she is due anytime now for another one. Patient states she received her first dose of her COVID vaccine on 03/11/19, and she will receive her 2nd dose on 04/01/19. Patient wants to know if it is still ok to receive her B12 shots even though she is in the middle of receiving her COVID vaccines? Please advise. Thanks!

## 2019-03-18 ENCOUNTER — Other Ambulatory Visit: Payer: Self-pay | Admitting: Unknown Physician Specialty

## 2019-03-18 DIAGNOSIS — H8109 Meniere's disease, unspecified ear: Secondary | ICD-10-CM

## 2019-03-18 NOTE — Telephone Encounter (Signed)
Noted  

## 2019-03-18 NOTE — Telephone Encounter (Signed)
I contacted patient and notified her of Dr. Synthia Innocent recommendations. Patient verbalized understanding. Patient also scheduled for B12 shot.  Thanks!

## 2019-03-19 ENCOUNTER — Other Ambulatory Visit: Payer: Self-pay | Admitting: Unknown Physician Specialty

## 2019-03-19 DIAGNOSIS — H8109 Meniere's disease, unspecified ear: Secondary | ICD-10-CM

## 2019-03-19 NOTE — Telephone Encounter (Signed)
[place] given info on device.

## 2019-03-19 NOTE — Telephone Encounter (Signed)
LMOVM for Sierra Kaiser requesting call back to [place]. Direct number provided.

## 2019-03-20 ENCOUNTER — Ambulatory Visit (INDEPENDENT_AMBULATORY_CARE_PROVIDER_SITE_OTHER): Payer: Medicare Other

## 2019-03-20 ENCOUNTER — Other Ambulatory Visit: Payer: Self-pay

## 2019-03-20 DIAGNOSIS — E538 Deficiency of other specified B group vitamins: Secondary | ICD-10-CM | POA: Diagnosis not present

## 2019-03-20 MED ORDER — CYANOCOBALAMIN 1000 MCG/ML IJ SOLN
1000.0000 ug | Freq: Once | INTRAMUSCULAR | Status: AC
  Administered 2019-03-20: 1000 ug via INTRAMUSCULAR

## 2019-03-20 NOTE — Progress Notes (Signed)
Per orders of Dr. Gutierrez, injection of vit B12 given by Leannah Guse. Patient tolerated injection well.  

## 2019-03-22 NOTE — Progress Notes (Signed)
PPM remote 

## 2019-04-02 ENCOUNTER — Other Ambulatory Visit: Payer: Self-pay

## 2019-04-02 ENCOUNTER — Ambulatory Visit
Admission: RE | Admit: 2019-04-02 | Discharge: 2019-04-02 | Disposition: A | Discharge status: Home / Self Care | Payer: Medicare Other | Source: Ambulatory Visit | Attending: Unknown Physician Specialty | Admitting: Unknown Physician Specialty

## 2019-04-02 DIAGNOSIS — H8109 Meniere's disease, unspecified ear: Secondary | ICD-10-CM

## 2019-04-03 ENCOUNTER — Other Ambulatory Visit: Payer: Self-pay

## 2019-04-03 ENCOUNTER — Ambulatory Visit
Admission: RE | Admit: 2019-04-03 | Discharge: 2019-04-03 | Disposition: A | Discharge status: Home / Self Care | Payer: Medicare Other | Source: Ambulatory Visit | Attending: Unknown Physician Specialty | Admitting: Unknown Physician Specialty

## 2019-04-03 ENCOUNTER — Other Ambulatory Visit
Admission: RE | Admit: 2019-04-03 | Discharge: 2019-04-03 | Disposition: A | Discharge status: Home / Self Care | Payer: Medicare Other | Source: Ambulatory Visit | Attending: Unknown Physician Specialty | Admitting: Unknown Physician Specialty

## 2019-04-03 DIAGNOSIS — I672 Cerebral atherosclerosis: Secondary | ICD-10-CM | POA: Insufficient documentation

## 2019-04-03 DIAGNOSIS — H9191 Unspecified hearing loss, right ear: Secondary | ICD-10-CM | POA: Insufficient documentation

## 2019-04-03 DIAGNOSIS — H8101 Meniere's disease, right ear: Secondary | ICD-10-CM | POA: Diagnosis not present

## 2019-04-03 DIAGNOSIS — H8109 Meniere's disease, unspecified ear: Secondary | ICD-10-CM | POA: Diagnosis present

## 2019-04-03 LAB — CREATININE, SERUM
Creatinine, Ser: 1.21 mg/dL — ABNORMAL HIGH (ref 0.44–1.00)
GFR calc Af Amer: 48 mL/min — ABNORMAL LOW (ref >60)
GFR calc non Af Amer: 41 mL/min — ABNORMAL LOW (ref >60)

## 2019-04-03 LAB — BUN: BUN: 30 mg/dL — ABNORMAL HIGH (ref 8–23)

## 2019-04-03 MED ORDER — IOHEXOL 300 MG/ML  SOLN
75.0000 mL | Freq: Once | INTRAMUSCULAR | Status: AC | PRN
  Administered 2019-04-03: 60 mL via INTRAVENOUS

## 2019-05-01 ENCOUNTER — Ambulatory Visit (INDEPENDENT_AMBULATORY_CARE_PROVIDER_SITE_OTHER): Payer: Medicare Other

## 2019-05-01 ENCOUNTER — Other Ambulatory Visit: Payer: Self-pay

## 2019-05-01 DIAGNOSIS — E538 Deficiency of other specified B group vitamins: Secondary | ICD-10-CM | POA: Diagnosis not present

## 2019-05-01 MED ORDER — CYANOCOBALAMIN 1000 MCG/ML IJ SOLN
1000.0000 ug | Freq: Once | INTRAMUSCULAR | Status: AC
  Administered 2019-05-01: 1000 ug via INTRAMUSCULAR

## 2019-05-01 NOTE — Progress Notes (Signed)
Per orders of Dr. Danise Mina, injection of monthly B12 given by Pilar Grammes, CMA in Left Deltoid. Patient tolerated injection well.

## 2019-05-14 ENCOUNTER — Ambulatory Visit (INDEPENDENT_AMBULATORY_CARE_PROVIDER_SITE_OTHER): Payer: Medicare Other | Admitting: *Deleted

## 2019-05-14 DIAGNOSIS — I442 Atrioventricular block, complete: Secondary | ICD-10-CM

## 2019-05-14 LAB — CUP PACEART REMOTE DEVICE CHECK
Battery Remaining Longevity: 139 mo
Battery Remaining Percentage: 95.5 %
Battery Voltage: 2.98 V
Brady Statistic RV Percent Paced: 99 %
Date Time Interrogation Session: 20210310032644
Implantable Lead Implant Date: 20160217
Implantable Lead Location: 753860
Implantable Lead Model: 1948
Implantable Pulse Generator Implant Date: 20160217
Lead Channel Impedance Value: 560 Ohm
Lead Channel Pacing Threshold Amplitude: 0.5 V
Lead Channel Pacing Threshold Pulse Width: 0.4 ms
Lead Channel Sensing Intrinsic Amplitude: 8.4 mV
Lead Channel Setting Pacing Amplitude: 0.75 V
Lead Channel Setting Pacing Pulse Width: 0.4 ms
Lead Channel Setting Sensing Sensitivity: 2.5 mV
Pulse Gen Model: 2240
Pulse Gen Serial Number: 3050195

## 2019-05-15 NOTE — Progress Notes (Signed)
PPM Remote  

## 2019-06-04 ENCOUNTER — Other Ambulatory Visit: Payer: Self-pay

## 2019-06-04 ENCOUNTER — Ambulatory Visit (INDEPENDENT_AMBULATORY_CARE_PROVIDER_SITE_OTHER): Payer: Medicare Other

## 2019-06-04 DIAGNOSIS — E538 Deficiency of other specified B group vitamins: Secondary | ICD-10-CM

## 2019-06-04 MED ORDER — CYANOCOBALAMIN 1000 MCG/ML IJ SOLN
1000.0000 ug | Freq: Once | INTRAMUSCULAR | Status: AC
  Administered 2019-06-04: 1000 ug via INTRAMUSCULAR

## 2019-06-04 NOTE — Progress Notes (Signed)
Per orders of Dr. Cody, injection of vit B12 given by Naveya Ellerman. Patient tolerated injection well.  

## 2019-07-15 ENCOUNTER — Ambulatory Visit (INDEPENDENT_AMBULATORY_CARE_PROVIDER_SITE_OTHER): Payer: Medicare Other

## 2019-07-15 DIAGNOSIS — E538 Deficiency of other specified B group vitamins: Secondary | ICD-10-CM

## 2019-07-15 MED ORDER — CYANOCOBALAMIN 1000 MCG/ML IJ SOLN
1000.0000 ug | Freq: Once | INTRAMUSCULAR | Status: AC
  Administered 2019-07-15: 1000 ug via INTRAMUSCULAR

## 2019-07-15 NOTE — Progress Notes (Signed)
Monthly B12 10109mcg/ml 62ml given in left deltoid. Tolerated well.

## 2019-08-13 ENCOUNTER — Ambulatory Visit (INDEPENDENT_AMBULATORY_CARE_PROVIDER_SITE_OTHER): Payer: Medicare Other | Admitting: *Deleted

## 2019-08-13 DIAGNOSIS — I4821 Permanent atrial fibrillation: Secondary | ICD-10-CM | POA: Diagnosis not present

## 2019-08-13 LAB — CUP PACEART REMOTE DEVICE CHECK
Battery Remaining Longevity: 139 mo
Battery Remaining Percentage: 95.5 %
Battery Voltage: 2.98 V
Brady Statistic RV Percent Paced: 99 %
Date Time Interrogation Session: 20210609030615
Implantable Lead Implant Date: 20160217
Implantable Lead Location: 753860
Implantable Lead Model: 1948
Implantable Pulse Generator Implant Date: 20160217
Lead Channel Impedance Value: 560 Ohm
Lead Channel Pacing Threshold Amplitude: 0.625 V
Lead Channel Pacing Threshold Pulse Width: 0.4 ms
Lead Channel Sensing Intrinsic Amplitude: 9.2 mV
Lead Channel Setting Pacing Amplitude: 0.875
Lead Channel Setting Pacing Pulse Width: 0.4 ms
Lead Channel Setting Sensing Sensitivity: 2.5 mV
Pulse Gen Model: 2240
Pulse Gen Serial Number: 3050195

## 2019-08-14 NOTE — Progress Notes (Signed)
Remote pacemaker transmission.   

## 2019-08-19 ENCOUNTER — Ambulatory Visit: Payer: Medicare Other | Admitting: *Deleted

## 2019-08-19 DIAGNOSIS — E538 Deficiency of other specified B group vitamins: Secondary | ICD-10-CM

## 2019-08-19 MED ORDER — CYANOCOBALAMIN 1000 MCG/ML IJ SOLN
1000.0000 ug | Freq: Once | INTRAMUSCULAR | Status: AC
  Administered 2021-04-18: 1000 ug via INTRAMUSCULAR

## 2019-08-19 NOTE — Progress Notes (Signed)
Per orders of Dr. Gutierrez, injection of Vit B12 given by Keisy Strickler M. Patient tolerated injection well.  

## 2019-09-22 ENCOUNTER — Telehealth: Payer: Self-pay | Admitting: *Deleted

## 2019-09-22 NOTE — Telephone Encounter (Signed)
Noted. Will see then.  

## 2019-09-22 NOTE — Telephone Encounter (Signed)
Patient called to schedule a nurse visit for a B-12 but was transferred to triage because of dizzy spells. Spoke to patient and was advised that she has been having dizzy spells off and on for about a week. Patient stated that she has had SOB and itching for years but the SOB has gotten a little worse in the past couple of weeks. Patient stated that she has a pacemaker but has not seen her cardiologist for a while. Patient stated today at 1:40 her blood pressure was 113/53 and heart rate 79.  Patient stated that the dizziness was bad yesterday but some better today. Patient stated that she has low energy and is wondering if all of this could be related to her pacemaker.Patient denies any chest pain. Patient stated that she has not seen Dr. Danise Mina for a while and would like to see him. Patient scheduled for appointment 09/24/19 at 11:15. Patient was given ER/911 precautions and she verbalized understanding.

## 2019-09-24 ENCOUNTER — Other Ambulatory Visit: Payer: Self-pay

## 2019-09-24 ENCOUNTER — Ambulatory Visit (INDEPENDENT_AMBULATORY_CARE_PROVIDER_SITE_OTHER): Payer: Medicare Other | Admitting: Family Medicine

## 2019-09-24 ENCOUNTER — Ambulatory Visit: Payer: Medicare Other

## 2019-09-24 ENCOUNTER — Encounter: Payer: Self-pay | Admitting: Family Medicine

## 2019-09-24 VITALS — BP 120/62 | HR 72 | Temp 97.9°F | Ht 68.0 in | Wt 139.0 lb

## 2019-09-24 DIAGNOSIS — R42 Dizziness and giddiness: Secondary | ICD-10-CM

## 2019-09-24 DIAGNOSIS — I5032 Chronic diastolic (congestive) heart failure: Secondary | ICD-10-CM | POA: Diagnosis not present

## 2019-09-24 DIAGNOSIS — R5382 Chronic fatigue, unspecified: Secondary | ICD-10-CM

## 2019-09-24 DIAGNOSIS — E538 Deficiency of other specified B group vitamins: Secondary | ICD-10-CM

## 2019-09-24 DIAGNOSIS — R0609 Other forms of dyspnea: Secondary | ICD-10-CM

## 2019-09-24 DIAGNOSIS — Z7901 Long term (current) use of anticoagulants: Secondary | ICD-10-CM

## 2019-09-24 DIAGNOSIS — K219 Gastro-esophageal reflux disease without esophagitis: Secondary | ICD-10-CM

## 2019-09-24 DIAGNOSIS — I4821 Permanent atrial fibrillation: Secondary | ICD-10-CM

## 2019-09-24 DIAGNOSIS — I442 Atrioventricular block, complete: Secondary | ICD-10-CM

## 2019-09-24 DIAGNOSIS — E611 Iron deficiency: Secondary | ICD-10-CM

## 2019-09-24 DIAGNOSIS — L299 Pruritus, unspecified: Secondary | ICD-10-CM

## 2019-09-24 DIAGNOSIS — E785 Hyperlipidemia, unspecified: Secondary | ICD-10-CM

## 2019-09-24 MED ORDER — CYANOCOBALAMIN 1000 MCG/ML IJ SOLN
1000.0000 ug | Freq: Once | INTRAMUSCULAR | Status: AC
  Administered 2019-09-24: 1000 ug via INTRAMUSCULAR

## 2019-09-24 MED ORDER — FUROSEMIDE 20 MG PO TABS: 20.0000 mg | ORAL_TABLET | ORAL | 3 refills | Status: DC | PRN

## 2019-09-24 NOTE — Assessment & Plan Note (Signed)
Continue xarelto

## 2019-09-24 NOTE — Assessment & Plan Note (Signed)
Compliant with monthly B12 shots - doesn't feel this has significantly helped fatigue.

## 2019-09-24 NOTE — Assessment & Plan Note (Signed)
Ongoing despite benadryl and triamcinolone through derm. Did not tolerate low dose gabapentin well. Encouraged derm f/u.  She is also overdue for mammogram - encouraged she schedule. Will further review preventative care at [facility] next month.

## 2019-09-24 NOTE — Assessment & Plan Note (Addendum)
Seems euvolemic however weight gain noted - see below.

## 2019-09-24 NOTE — Progress Notes (Signed)
This visit was conducted in person.  BP 120/62 (BP Location: Left Arm, Patient Position: Sitting, Cuff Size: Normal)   Pulse 72   Temp 97.9 F (36.6 C) (Temporal)   Ht 5\' 8"  (1.727 m)   Wt 139 lb (63 kg)   SpO2 96%   BMI 21.13 kg/m   Orthostatic VS for the past 24 hrs (Last 3 readings):  BP- Lying BP- Standing at 0 minutes  09/24/19 1131 -- 120/60  09/24/19 1128 142/78 --    BP Readings from Last 3 Encounters:  09/24/19 120/62  02/19/19 120/62  10/28/18 122/62    CC: dizziness, dyspnea, itching "I just don't feel normal" Subjective:    Patient ID: [name], female    DOB: Feb 02, 1934, 86 y.o.   MRN: [id number]  HPI: EKNOOR NOVACK is a 86 y.o. female presenting on 09/24/2019 for Dizziness (C/o dizziness.  Started 2 wks ago.  ), Shortness of Breath (C/o SOB worse than usual. ), and Pruritis (C/o itching allover.  Seen by dermatology. Prescribed triamcinolone oint. )   Chronic issues of dizziness, dyspnea and pruritis (10 yrs in duration) acutely worse over the past 2 weeks. Fingers feel numb, increased dyspnea noted, notes trouble with poor fingernail growth. Occasional imbalance. 6 lb weight gain noted since 02/2019. No recent lasix use. No claudication symptoms.   She has stopped biotin, benadryl, zetia, crestor, lasix, omeprazole - unclear why. Denies GERD symptoms so will stay off this.   She saw Dr Tami Ribas last week - R asymmetric hearing loss - told MRI was normal.   Pacemaker present, has not seen cardiology since 11/2018.  Last B12 shot 08/19/2019. She just received another B12 shot today.   Sees derm for chronic pruritis 11/2018 (Dr Charlyne Quale) - recommended regular moisturizer throughout the day, also treated with TCI ointment BID PRN (she has just refilled). Trial low dose gabapentin - caused bad mood trouble so she stopped. Also discussed benadryl - small amt at a time - she takes 12.5mg  nightly. rec 2 mo f/u at that time - has not seen since.   Due for CPE next  month.  Completed COVID vaccine Pfizer 03/2019.      Relevant past medical, surgical, family and social history reviewed and updated as indicated. Interim medical history since our last visit reviewed. Allergies and medications reviewed and updated. Outpatient Medications Prior to Visit  Medication Sig Dispense Refill  . cyanocobalamin (,VITAMIN B-12,) 1000 MCG/ML injection Inject 1 mL (1,000 mcg total) into the muscle every 30 (thirty) days.    . fluticasone (FLONASE) 50 MCG/ACT nasal spray SPRAY 2 SPRAYS INTO EACH NOSTRIL EVERY DAY (Patient taking differently: Place 2 sprays into both nostrils daily. Takes as needed) 16 g 3  . Polyethyl Glycol-Propyl Glycol (SYSTANE OP) Place 1 drop into both eyes daily as needed (dry eyes).    . triamcinolone (KENALOG) 0.025 % ointment Apply 1 application topically 2 (two) times daily.     . furosemide (LASIX) 40 MG tablet Take 40 mg by mouth as needed.    . ezetimibe (ZETIA) 10 MG tablet Take 1 tablet (10 mg total) by mouth daily. (Patient not taking: Reported on 09/24/2019) 90 tablet 3  . meclizine (ANTIVERT) 25 MG tablet Take 25 mg by mouth 2 (two) times daily.  (Patient not taking: Reported on 09/24/2019)    . Rivaroxaban (XARELTO) 15 MG TABS tablet TAKE 1 TABLET DAILY WITH SUPPER 90 tablet 1  . rosuvastatin (CRESTOR) 20 MG tablet Take 1 tablet (  20 mg total) by mouth every other day. (Patient not taking: Reported on 09/24/2019) 45 tablet 3  . BIOTIN PO Take by mouth daily.    . diphenhydrAMINE-zinc acetate (BENADRYL) cream Apply 1 application topically 2 (two) times daily as needed for itching.    . gabapentin (NEURONTIN) 100 MG capsule Take 100 mg by mouth at bedtime.  (Patient not taking: Reported on 09/24/2019)    . omeprazole (PRILOSEC) 40 MG capsule Take 40 mg by mouth daily. (Patient not taking: Reported on 09/24/2019)     Facility-Administered Medications Prior to Visit  Medication Dose Route Frequency Provider Last Rate Last Admin  . cyanocobalamin  ((VITAMIN B-12)) injection 1,000 mcg  1,000 mcg Intramuscular Once [name], MD         Per HPI unless specifically indicated in ROS section below Review of Systems Objective:  BP 120/62 (BP Location: Left Arm, Patient Position: Sitting, Cuff Size: Normal)   Pulse 72   Temp 97.9 F (36.6 C) (Temporal)   Ht 5\' 8"  (1.727 m)   Wt 139 lb (63 kg)   SpO2 96%   BMI 21.13 kg/m   Wt Readings from Last 3 Encounters:  09/24/19 139 lb (63 kg)  02/19/19 133 lb (60.3 kg)  10/28/18 132 lb 1 oz (59.9 kg)      Physical Exam Vitals and nursing note reviewed.  Constitutional:      Appearance: Normal appearance. She is well-developed. She is not ill-appearing.  Eyes:     Extraocular Movements: Extraocular movements intact.     Conjunctiva/sclera: Conjunctivae normal.     Pupils: Pupils are equal, round, and reactive to light.  Cardiovascular:     Rate and Rhythm: Normal rate and regular rhythm.     Pulses: Normal pulses.     Heart sounds: Normal heart sounds. No murmur heard.   Pulmonary:     Effort: Pulmonary effort is normal. No respiratory distress.     Breath sounds: Normal breath sounds. No wheezing, rhonchi or rales.  Musculoskeletal:        General: No tenderness. Normal range of motion.     Right lower leg: No edema.     Left lower leg: No edema.     Comments: 1+ DP bilaterally  Skin:    General: Skin is warm and dry.     Capillary Refill: Capillary refill takes less than 2 seconds.     Findings: No erythema or rash.  Neurological:     Mental Status: She is alert.  Psychiatric:        Mood and Affect: Mood normal.        Behavior: Behavior normal.       Assessment & Plan:  This visit occurred during the SARS-CoV-2 public health emergency.  Safety protocols were in place, including screening questions prior to the visit, additional usage of staff PPE, and extensive cleaning of exam room while observing appropriate contact time as indicated for disinfecting  solutions.   Problem List Items Addressed This Visit    Pruritus    Ongoing despite benadryl and triamcinolone through derm. Did not tolerate low dose gabapentin well. Encouraged derm f/u.  She is also overdue for mammogram - encouraged she schedule. Will further review preventative care at CPE next month.       Orthostatic dizziness - Primary    Noted on vitals today.  She does not drink enough water. Advised increase water intake, start using compression stockings (Rx provided today to take to local DME  supply store for fitting).       Low serum vitamin B12    Compliant with monthly B12 shots - doesn't feel this has significantly helped fatigue.        Iron deficiency    Feels similar to how she felt prior to last iron infusion - with benefit after infusion. Offered labwork today, she prefers to wait until CPE next month.       Hyperlipidemia    She had self-stopped crestor and zetia - unclear why. Advised restart both, will update FLP at CPE next month.       Relevant Medications   furosemide (LASIX) 20 MG tablet   GERD (gastroesophageal reflux disease)    Denies symptoms off PPI - will remain off omeprazole.       Complete heart block ([place])    Pacer in place. May be due for EP f/u.       Relevant Medications   furosemide (LASIX) 20 MG tablet   Chronic fatigue   Chronic dyspnea    Progressively worsening over the last 2 weeks, benign exam today.   Rec restart lasix 20mg  QD PRN to see if any improvement in dyspnea. Will start with lower dose than prior 40mg  given noted orthostasis      Chronic diastolic CHF (congestive heart failure) (HCC)    Seems euvolemic however weight gain noted - see below.       Relevant Medications   furosemide (LASIX) 20 MG tablet   Chronic anticoagulation    Continue xarelto.       Atrial fibrillation, permanent ([place])    Sounds regular today S/p ablation previously Continues xarelto.       Relevant Medications   furosemide  (LASIX) 20 MG tablet       Meds ordered this encounter  Medications  . cyanocobalamin ((VITAMIN B-12)) injection 1,000 mcg  . furosemide (LASIX) 20 MG tablet    Sig: Take 1 tablet (20 mg total) by mouth as needed for fluid or edema.    Dispense:  30 tablet    Refill:  3   No orders of the defined types were placed in this encounter.   Patient Instructions  You received B12 shot today.  Schedule physical in 1 month - we will check labs at that time  Schedule follow up with Dr Tresa Garter and dermatology as you're due.  Restart crestor and zetia.  Ok to cut lasix 40mg  in half when you use it.  Your blood pressures drop when you stand up. I want you to increase water intake as well as use compression stockings (prescription provided today) - this may help dizziness.    Follow up plan: Return in about 4 weeks (around 10/22/2019) for annual exam, prior fasting for blood work, medicare wellness visit.  [name], MD

## 2019-09-24 NOTE — Assessment & Plan Note (Signed)
Feels similar to how she felt prior to last iron infusion - with benefit after infusion. Offered labwork today, she prefers to wait until CPE next month.

## 2019-09-24 NOTE — Assessment & Plan Note (Signed)
Sounds regular today S/p ablation previously Continues xarelto.

## 2019-09-24 NOTE — Assessment & Plan Note (Signed)
Pacer in place. May be due for EP f/u.

## 2019-09-24 NOTE — Assessment & Plan Note (Signed)
Noted on vitals today.  She does not drink enough water. Advised increase water intake, start using compression stockings (Rx provided today to take to local DME supply store for fitting).

## 2019-09-24 NOTE — Assessment & Plan Note (Signed)
Progressively worsening over the last 2 weeks, benign exam today.   Rec restart lasix 20mg  QD PRN to see if any improvement in dyspnea. Will start with lower dose than prior 40mg  given noted orthostasis

## 2019-09-24 NOTE — Assessment & Plan Note (Signed)
She had self-stopped crestor and zetia - unclear why. Advised restart both, will update FLP at CPE next month.

## 2019-09-24 NOTE — Patient Instructions (Addendum)
You received B12 shot today.  Schedule physical in 1 month - we will check labs at that time  Schedule follow up with Dr Tresa Garter and dermatology as you're due.  Restart crestor and zetia.  Ok to cut lasix 40mg  in half when you use it.  Your blood pressures drop when you stand up. I want you to increase water intake as well as use compression stockings (prescription provided today) - this may help dizziness.

## 2019-09-24 NOTE — Assessment & Plan Note (Signed)
Denies symptoms off PPI - will remain off omeprazole.

## 2019-10-14 ENCOUNTER — Other Ambulatory Visit: Payer: Self-pay | Admitting: Family Medicine

## 2019-10-14 DIAGNOSIS — E538 Deficiency of other specified B group vitamins: Secondary | ICD-10-CM

## 2019-10-14 DIAGNOSIS — E611 Iron deficiency: Secondary | ICD-10-CM

## 2019-10-14 DIAGNOSIS — R5382 Chronic fatigue, unspecified: Secondary | ICD-10-CM

## 2019-10-14 DIAGNOSIS — I4821 Permanent atrial fibrillation: Secondary | ICD-10-CM

## 2019-10-14 DIAGNOSIS — N183 Chronic kidney disease, stage 3 unspecified: Secondary | ICD-10-CM

## 2019-10-14 DIAGNOSIS — E785 Hyperlipidemia, unspecified: Secondary | ICD-10-CM

## 2019-10-15 ENCOUNTER — Other Ambulatory Visit: Payer: Self-pay

## 2019-10-15 ENCOUNTER — Other Ambulatory Visit (INDEPENDENT_AMBULATORY_CARE_PROVIDER_SITE_OTHER): Payer: Medicare Other

## 2019-10-15 ENCOUNTER — Ambulatory Visit (INDEPENDENT_AMBULATORY_CARE_PROVIDER_SITE_OTHER): Payer: Medicare Other

## 2019-10-15 DIAGNOSIS — E538 Deficiency of other specified B group vitamins: Secondary | ICD-10-CM | POA: Diagnosis not present

## 2019-10-15 DIAGNOSIS — R5382 Chronic fatigue, unspecified: Secondary | ICD-10-CM

## 2019-10-15 DIAGNOSIS — Z0000 Encounter for general adult medical examination without abnormal findings: Secondary | ICD-10-CM

## 2019-10-15 DIAGNOSIS — E611 Iron deficiency: Secondary | ICD-10-CM

## 2019-10-15 DIAGNOSIS — E785 Hyperlipidemia, unspecified: Secondary | ICD-10-CM

## 2019-10-15 DIAGNOSIS — N183 Chronic kidney disease, stage 3 unspecified: Secondary | ICD-10-CM

## 2019-10-15 DIAGNOSIS — I4821 Permanent atrial fibrillation: Secondary | ICD-10-CM | POA: Diagnosis not present

## 2019-10-15 LAB — CBC WITH DIFFERENTIAL/PLATELET
Basophils Absolute: 0.1 10*3/uL (ref 0.0–0.1)
Basophils Relative: 1.3 % (ref 0.0–3.0)
Eosinophils Absolute: 0.3 10*3/uL (ref 0.0–0.7)
Eosinophils Relative: 3.9 % (ref 0.0–5.0)
HCT: 41 % (ref 36.0–46.0)
Hemoglobin: 14 g/dL (ref 12.0–15.0)
Lymphocytes Relative: 28.1 % (ref 12.0–46.0)
Lymphs Abs: 1.8 10*3/uL (ref 0.7–4.0)
MCHC: 34.1 g/dL (ref 30.0–36.0)
MCV: 93.7 fl (ref 78.0–100.0)
Monocytes Absolute: 0.5 10*3/uL (ref 0.1–1.0)
Monocytes Relative: 7.9 % (ref 3.0–12.0)
Neutro Abs: 3.8 10*3/uL (ref 1.4–7.7)
Neutrophils Relative %: 58.8 % (ref 43.0–77.0)
Platelets: 183 10*3/uL (ref 150.0–400.0)
RBC: 4.37 Mil/uL (ref 3.87–5.11)
RDW: 13.5 % (ref 11.5–15.5)
WBC: 6.5 10*3/uL (ref 4.0–10.5)

## 2019-10-15 LAB — IBC PANEL
Iron: 89 ug/dL (ref 42–145)
Saturation Ratios: 21.4 % (ref 20.0–50.0)
Transferrin: 297 mg/dL (ref 212.0–360.0)

## 2019-10-15 LAB — COMPREHENSIVE METABOLIC PANEL
ALT: 9 U/L (ref 0–35)
AST: 12 U/L (ref 0–37)
Albumin: 4.3 g/dL (ref 3.5–5.2)
Alkaline Phosphatase: 58 U/L (ref 39–117)
BUN: 29 mg/dL — ABNORMAL HIGH (ref 6–23)
CO2: 27 mEq/L (ref 19–32)
Calcium: 9.9 mg/dL (ref 8.4–10.5)
Chloride: 106 mEq/L (ref 96–112)
Creatinine, Ser: 1.41 mg/dL — ABNORMAL HIGH (ref 0.40–1.20)
GFR: 35.49 mL/min — ABNORMAL LOW (ref >60.00)
Glucose, Bld: 86 mg/dL (ref 70–99)
Potassium: 4.4 mEq/L (ref 3.5–5.1)
Sodium: 143 mEq/L (ref 135–145)
Total Bilirubin: 0.6 mg/dL (ref 0.2–1.2)
Total Protein: 6.5 g/dL (ref 6.0–8.3)

## 2019-10-15 LAB — LIPID PANEL
Cholesterol: 128 mg/dL (ref 0–200)
HDL: 56.6 mg/dL (ref >39.00)
LDL Cholesterol: 51 mg/dL (ref 0–99)
NonHDL: 71.26
Total CHOL/HDL Ratio: 2
Triglycerides: 99 mg/dL (ref 0.0–149.0)
VLDL: 19.8 mg/dL (ref 0.0–40.0)

## 2019-10-15 LAB — FOLATE: Folate: 15.4 ng/mL (ref >5.9)

## 2019-10-15 LAB — VITAMIN B12: Vitamin B-12: 471 pg/mL (ref 211–911)

## 2019-10-15 LAB — TSH: TSH: 6.78 u[IU]/mL — ABNORMAL HIGH (ref 0.35–4.50)

## 2019-10-15 LAB — FERRITIN: Ferritin: 8.7 ng/mL — ABNORMAL LOW (ref 10.0–291.0)

## 2019-10-15 LAB — VITAMIN D 25 HYDROXY (VIT D DEFICIENCY, FRACTURES): VITD: 28.63 ng/mL — ABNORMAL LOW (ref 30.00–100.00)

## 2019-10-15 NOTE — Progress Notes (Signed)
PCP notes:  Health Maintenance: Tdap- insurance/financial   Abnormal Screenings: none   Patient concerns: Dizziness- chronic issue and she has been seeing the doctor concerning this for years she states   Nurse concerns: none   Next PCP appt.: 10/29/2019 @ 3:15 pm

## 2019-10-15 NOTE — Patient Instructions (Signed)
Ms. Sierra Kaiser , Thank you for taking time to come for your Medicare Wellness Visit. I appreciate your ongoing commitment to your health goals. Please review the following plan we discussed and let me know if I can assist you in the future.   Screening recommendations/referrals: Colonoscopy: no longer required Mammogram: no longer required Bone Density: declined Recommended yearly ophthalmology/optometry visit for glaucoma screening and checkup Recommended yearly dental visit for hygiene and checkup  Vaccinations: Influenza vaccine: due, will be available in the office at he end of August Pneumococcal vaccine: Completed series Tdap vaccine: decline- insurance/financial Shingles vaccine: due, check with your insurance regarding coverage   Covid-19:Completed series  Advanced directives: Please bring a copy of your POA (Power of [place]) and/or Living Will to your next appointment.   Conditions/risks identified: hyperlipidemia  Next appointment: Follow up in one year for your annual wellness visit    Preventive Care 65 Years and Older, Female Preventive care refers to lifestyle choices and visits with your health care provider that can promote health and wellness. What does preventive care include?  A yearly physical exam. This is also called an annual well check.  Dental exams once or twice a year.  Routine eye exams. Ask your health care provider how often you should have your eyes checked.  Personal lifestyle choices, including:  Daily care of your teeth and gums.  Regular physical activity.  Eating a healthy diet.  Avoiding tobacco and drug use.  Limiting alcohol use.  Practicing safe sex.  Taking low-dose aspirin every day.  Taking vitamin and mineral supplements as recommended by your health care provider. What happens during an annual well check? The services and screenings done by your health care provider during your annual well check will depend on your age,  overall health, lifestyle risk factors, and family history of disease. Counseling  Your health care provider may ask you questions about your:  Alcohol use.  Tobacco use.  Drug use.  Emotional well-being.  Home and relationship well-being.  Sexual activity.  Eating habits.  History of falls.  Memory and ability to understand (cognition).  Work and work Statistician.  Reproductive health. Screening  You may have the following tests or measurements:  Height, weight, and BMI.  Blood pressure.  Lipid and cholesterol levels. These may be checked every 5 years, or more frequently if you are over 86 years old.  Skin check.  Lung cancer screening. You may have this screening every year starting at age 53 if you have a 30-pack-year history of smoking and currently smoke or have quit within the past 15 years.  Fecal occult blood test (FOBT) of the stool. You may have this test every year starting at age 9.  Flexible sigmoidoscopy or colonoscopy. You may have a sigmoidoscopy every 5 years or a colonoscopy every 10 years starting at age 5.  Hepatitis C blood test.  Hepatitis B blood test.  Sexually transmitted disease (STD) testing.  Diabetes screening. This is done by checking your blood sugar (glucose) after you have not eaten for a while (fasting). You may have this done every 1-3 years.  Bone density scan. This is done to screen for osteoporosis. You may have this done starting at age 51.  Mammogram. This may be done every 1-2 years. Talk to your health care provider about how often you should have regular mammograms. Talk with your health care provider about your test results, treatment options, and if necessary, the need for more tests. Vaccines  Your  health care provider may recommend certain vaccines, such as:  Influenza vaccine. This is recommended every year.  Tetanus, diphtheria, and acellular pertussis (Tdap, Td) vaccine. You may need a Td booster every 10  years.  Zoster vaccine. You may need this after age 59.  Pneumococcal 13-valent conjugate (PCV13) vaccine. One dose is recommended after age 53.  Pneumococcal polysaccharide (PPSV23) vaccine. One dose is recommended after age 72. Talk to your health care provider about which screenings and vaccines you need and how often you need them. This information is not intended to replace advice given to you by your health care provider. Make sure you discuss any questions you have with your health care provider. Document Released: 03/19/2015 Document Revised: 11/10/2015 Document Reviewed: 12/22/2014 Elsevier Interactive Patient Education  2017 [place] Prevention in the Home Falls can cause injuries. They can happen to people of all ages. There are many things you can do to make your home safe and to help prevent falls. What can I do on the outside of my home?  Regularly fix the edges of walkways and driveways and fix any cracks.  Remove anything that might make you trip as you walk through a door, such as a raised step or threshold.  Trim any bushes or trees on the path to your home.  Use bright outdoor lighting.  Clear any walking paths of anything that might make someone trip, such as rocks or tools.  Regularly check to see if handrails are loose or broken. Make sure that both sides of any steps have handrails.  Any raised decks and porches should have guardrails on the edges.  Have any leaves, snow, or ice cleared regularly.  Use sand or salt on walking paths during winter.  Clean up any spills in your garage right away. This includes oil or grease spills. What can I do in the bathroom?  Use night lights.  Install grab bars by the toilet and in the tub and shower. Do not use towel bars as grab bars.  Use non-skid mats or decals in the tub or shower.  If you need to sit down in the shower, use a plastic, non-slip stool.  Keep the floor dry. Clean up any water that  spills on the floor as soon as it happens.  Remove soap buildup in the tub or shower regularly.  Attach bath mats securely with double-sided non-slip rug tape.  Do not have throw rugs and other things on the floor that can make you trip. What can I do in the bedroom?  Use night lights.  Make sure that you have a light by your bed that is easy to reach.  Do not use any sheets or blankets that are too big for your bed. They should not hang down onto the floor.  Have a firm chair that has side arms. You can use this for support while you get dressed.  Do not have throw rugs and other things on the floor that can make you trip. What can I do in the kitchen?  Clean up any spills right away.  Avoid walking on wet floors.  Keep items that you use a lot in easy-to-reach places.  If you need to reach something above you, use a strong step stool that has a grab bar.  Keep electrical cords out of the way.  Do not use floor polish or wax that makes floors slippery. If you must use wax, use non-skid floor wax.  Do not  have throw rugs and other things on the floor that can make you trip. What can I do with my stairs?  Do not leave any items on the stairs.  Make sure that there are handrails on both sides of the stairs and use them. Fix handrails that are broken or loose. Make sure that handrails are as long as the stairways.  Check any carpeting to make sure that it is firmly attached to the stairs. Fix any carpet that is loose or worn.  Avoid having throw rugs at the top or bottom of the stairs. If you do have throw rugs, attach them to the floor with carpet tape.  Make sure that you have a light switch at the top of the stairs and the bottom of the stairs. If you do not have them, ask someone to add them for you. What else can I do to help prevent falls?  Wear shoes that:  Do not have high heels.  Have rubber bottoms.  Are comfortable and fit you well.  Are closed at the  toe. Do not wear sandals.  If you use a stepladder:  Make sure that it is fully opened. Do not climb a closed stepladder.  Make sure that both sides of the stepladder are locked into place.  Ask someone to hold it for you, if possible.  Clearly mark and make sure that you can see:  Any grab bars or handrails.  First and last steps.  Where the edge of each step is.  Use tools that help you move around (mobility aids) if they are needed. These include:  Canes.  Walkers.  Scooters.  Crutches.  Turn on the lights when you go into a dark area. Replace any light bulbs as soon as they burn out.  Set up your furniture so you have a clear path. Avoid moving your furniture around.  If any of your floors are uneven, fix them.  If there are any pets around you, be aware of where they are.  Review your medicines with your doctor. Some medicines can make you feel dizzy. This can increase your chance of falling. Ask your doctor what other things that you can do to help prevent falls. This information is not intended to replace advice given to you by your health care provider. Make sure you discuss any questions you have with your health care provider. Document Released: 12/17/2008 Document Revised: 07/29/2015 Document Reviewed: 03/27/2014 Elsevier Interactive Patient Education  2017 [employer].

## 2019-10-15 NOTE — Progress Notes (Signed)
Subjective:   Sierra Kaiser is a 86 y.o. female who presents for Medicare Annual (Subsequent) preventive examination.  Review of Systems: N/A      I connected with the patient today by telephone and verified that I am speaking with the correct person using two identifiers. Location patient: home Location nurse: work Persons participating in the virtual visit: patient, Marine scientist.   I discussed the limitations, risks, security and privacy concerns of performing an evaluation and management service by telephone and the availability of in person appointments. I also discussed with the patient that there may be a patient responsible charge related to this service. The patient expressed understanding and verbally consented to this telephonic visit.    Interactive audio and video telecommunications were attempted between this nurse and patient, however failed, due to patient having technical difficulties OR patient did not have access to video capability.  We continued and completed visit with audio only.     Cardiac Risk Factors include: advanced age (>38men, >90 women);dyslipidemia     Objective:    Today's Vitals   There is no height or weight on file to calculate BMI.  Advanced Directives 10/15/2019 08/29/2017 01/17/2017 10/09/2016 10/07/2015 05/07/2014 05/06/2014  Does Patient Have a Medical Advance Directive? Yes Yes Yes Yes Yes No No  Type of Paramedic of [place];Living will [place];Living will [place];Living will [place];Living will [place];Living will - -  Does patient want to make changes to medical advance directive? - - - No - Patient declined No - Patient declined - -  Copy of [place] in Chart? No - copy requested - No - copy requested - No - copy requested - -  Would patient like information on creating a medical advance directive? - - - - - No - patient  declined information No - patient declined information    Current Medications (verified) Outpatient Encounter Medications as of 10/15/2019  Medication Sig  . cyanocobalamin (,VITAMIN B-12,) 1000 MCG/ML injection Inject 1 mL (1,000 mcg total) into the muscle every 30 (thirty) days.  Marland Kitchen ezetimibe (ZETIA) 10 MG tablet Take 1 tablet (10 mg total) by mouth daily.  . fluticasone (FLONASE) 50 MCG/ACT nasal spray SPRAY 2 SPRAYS INTO EACH NOSTRIL EVERY DAY (Patient taking differently: Place 2 sprays into both nostrils daily. Takes as needed)  . furosemide (LASIX) 20 MG tablet Take 1 tablet (20 mg total) by mouth as needed for fluid or edema.  [name] Glycol-Propyl Glycol (SYSTANE OP) Place 1 drop into both eyes daily as needed (dry eyes).  . Rivaroxaban (XARELTO) 15 MG TABS tablet TAKE 1 TABLET DAILY WITH SUPPER  . rosuvastatin (CRESTOR) 20 MG tablet Take 1 tablet (20 mg total) by mouth every other day.  . triamcinolone (KENALOG) 0.025 % ointment Apply 1 application topically 2 (two) times daily.   . meclizine (ANTIVERT) 25 MG tablet Take 25 mg by mouth 2 (two) times daily.  (Patient not taking: Reported on 10/15/2019)   Facility-Administered Encounter Medications as of 10/15/2019  Medication  . cyanocobalamin ((VITAMIN B-12)) injection 1,000 mcg    Allergies (verified) Gadolinium derivatives, Contrast media [iodinated diagnostic agents], Gabapentin, Metronidazole, Other, Sertraline, and Diltiazem hcl   History: Past Medical History:  Diagnosis Date  . Anxiety   . CAD (coronary artery disease)   . CHF (congestive heart failure) ([place])   . Esophageal reflux   . GI bleeding 2012   during  EGD necessitating open surgery  . Hiatal hernia   . History of blood transfusion   . History of diverticulitis   . History of UTI   . HTN (hypertension)   . Hyperlipidemia   . Meniere's disease 1970s  . Mitral regurgitation   . Osteopenia 12/11/2015   T score -1.2 femur, -2.0 spine (12/2015)  .  Permanent atrial fibrillation ([place])    a. permanent b. s/p PVI RFA at [facility] 10/14 c. failed amio (neuro toxicity) and Tikosyn d. 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 PUD (peptic ulcer disease)   . Raynaud's disease   . Raynaud's disease   . SNHL (sensorineural hearing loss)    right ear  . Syncopal episodes   . Tinnitus   . Tinnitus   . Ulcerative colitis ([place])    per prior records  . Urinary incontinence, urge    Past Surgical History:  Procedure Laterality Date  . APPENDECTOMY  1973  . AV NODE ABLATION N/A 05/06/2014   Procedure: AV NODE ABLATION;  Surgeon: [name], MD;  Location[facility] CATH LAB;  Service: Cardiovascular;  Laterality: N/A;  . CARDIAC CATHETERIZATION  2014   [facility]  . CARDIAC ELECTROPHYSIOLOGY STUDY AND ABLATION    . CHOLECYSTECTOMY  2010  . COLONOSCOPY  2010   polyps  . COLONOSCOPY WITH PROPOFOL N/A 08/29/2017   TAs, no f/u recommended [facility], Benay Pike, MD)  . ESOPHAGOGASTRODUODENOSCOPY (EGD) WITH PROPOFOL N/A 08/29/2017   benign biopsies [facility], Benay Pike, MD)  . HEMORRHOID SURGERY    . LAPAROTOMY  2012   EGD biopsy led to bleeding - needed laparotomy to stop bleed (Dr Jimmye Norman [facility])  . PERMANENT PACEMAKER INSERTION N/A 04/22/2014   STJ single chamber pacemaker implanted by Dr [name]  . TRANSESOPHAGEAL ECHOCARDIOGRAM WITH CARDIOVERSION  2014   [facility]  . UPPER GI ENDOSCOPY  2012   with polypectomy  . VAGINAL HYSTERECTOMY  1973   elective; ovaries remained   Family History  Problem Relation Age of Onset  . Arrhythmia Mother   . Hypertension Mother   . Diabetes Mother   . Heart failure Brother   . Heart failure Brother   . Cancer Brother        colon (with colostomy)  . Breast cancer Sister 65  . Stroke Sister   . Alcohol abuse Brother   . Stroke Brother   . Diabetes Son    Social History   Socioeconomic History  . Marital status: Married    Spouse name: Not on file  . Number of  children: Not on file  . Years of education: Not on file  . Highest education level: Not on file  Occupational History  . Not on file  Tobacco Use  . Smoking status: Never Smoker  . Smokeless tobacco: Never Used  Vaping Use  . Vaping Use: Never used  Substance and Sexual Activity  . Alcohol use: Yes    Alcohol/week: 1.0 - 2.0 standard drink    Types: 1 - 2 Glasses of wine per week    Comment: infrequent mixed drinks  . Drug use: Never  . Sexual activity: Never  Other Topics Concern  . Not on file  Social History Narrative   Lives with husband.    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 Difficulty of Paying Living Expenses: Not hard at all  Food Insecurity: No Food Insecurity  . Worried About Charity fundraiser in the Last Year: Never true  . Ran Out of Food in the Last Year: Never true  Transportation Needs: No Transportation Needs  . Lack of Transportation (Medical): No  . Lack of Transportation (Non-Medical): No  Physical Activity: Inactive  . Days of Exercise per Week: 0 days  . Minutes of Exercise per Session: 0 min  Stress: Stress Concern Present  . Feeling of Stress : To some extent  Social Connections:   . Frequency of Communication with Friends and Family:   . Frequency of Social Gatherings with Friends and Family:   . Attends Religious Services:   . Active Member of Clubs or Organizations:   . Attends Archivist Meetings:   Marland Kitchen Marital Status:     Tobacco Counseling Counseling given: Not Answered   Clinical Intake:  Pre-visit preparation completed: Yes  Pain : No/denies pain     Nutritional Risks: None Diabetes: No  How often do you need to have someone help you when you read instructions, pamphlets, or other written materials from your doctor or pharmacy?: 1 - Never What is the last grade level you  completed in school?: 12th  Diabetic: No Nutrition Risk Assessment:  Has the patient had any N/V/D within the last 2 months?  No  Does the patient have any non-healing wounds?  No  Has the patient had any unintentional weight loss or weight gain?  No   Diabetes:  Is the patient diabetic?  No  If diabetic, was a CBG obtained today?  N/A Did the patient bring in their glucometer from home?  N/A How often do you monitor your CBG's? N/A.   Financial Strains and Diabetes Management:  Are you having any financial strains with the device, your supplies or your medication? N/A.  Does the patient want to be seen by Chronic Care Management for management of their diabetes?  N/A Would the patient like to be referred to a Nutritionist or for Diabetic Management?  N/A     Interpreter Needed?: No  Information entered by :: CJohnson, LPN   Activities of Daily Living In your present state of health, do you have any difficulty performing the following activities: 10/15/2019  Hearing? N  Vision? N  Difficulty concentrating or making decisions? N  Walking or climbing stairs? N  Dressing or bathing? N  Doing errands, shopping? N  Preparing Food and eating ? N  Using the Toilet? N  In the past six months, have you accidently leaked urine? Y  Comment wears pads  Do you have problems with loss of bowel control? N  Managing your Medications? N  Managing your Finances? N  Housekeeping or managing your Housekeeping? N  Some recent data might be hidden    Patient Care Team: [name], MD as PCP - General (Family Medicine) [name], MD as PCP - Cardiology (Cardiology) Minna Merritts, MD as Consulting Physician (Cardiology)  Indicate any recent Medical Services you may have received from other than [facility] providers in the past year (date may be approximate).     Assessment:   This is a routine wellness examination for [place].  Hearing/Vision screen  Hearing Screening    125Hz  250Hz  500Hz  1000Hz  2000Hz  3000Hz  4000Hz  6000Hz  8000Hz   Right ear:           Left ear:  Vision Screening Comments: Advised patient to get annual eye exams. Last one was five years ago.   Dietary issues and exercise activities discussed: Current Exercise Habits: The patient does not participate in regular exercise at present, Exercise limited by: None identified  Goals    . other     After vacation, I plan to see a vein specialist to discuss leaky valve and shortness of breath.    . Patient Stated     10/15/2019, I will maintain and continue medications as prescribed.     . water intake     Starting 01/17/2017, I will attempt to drink at least 3-4 glasses of water daily.       Depression Screen PHQ 2/9 Scores 10/15/2019 10/18/2018 01/17/2017 01/12/2017 11/24/2016 10/09/2016 10/07/2015  PHQ - 2 Score 0 4 0 0 2 4 0  PHQ- 9 Score 0 16 0 4 11 13  -    Fall Risk Fall Risk  10/15/2019 10/18/2018 01/17/2017 01/12/2017 11/24/2016  Falls in the past year? 0 1 No No No  Number falls in past yr: 0 0 - - -  Injury with Fall? 0 1 - - -  Risk for fall due to : Medication side effect - - - -  Follow up Falls evaluation completed;Falls prevention discussed - - - -    Any stairs in or around the home? Yes  If so, are there any without handrails? No  Home free of loose throw rugs in walkways, pet beds, electrical cords, etc? Yes  Adequate lighting in your home to reduce risk of falls? Yes   ASSISTIVE DEVICES UTILIZED TO PREVENT FALLS:  Life alert? No  Use of a cane, walker or w/c? No  Grab bars in the bathroom? Yes  Shower chair or bench in shower? No  Elevated toilet seat or a handicapped toilet? No   TIMED UP AND GO:  Was the test performed? N/A, telephonic visit .    Cognitive Function: MMSE - Mini Mental State Exam 10/15/2019 01/17/2017 10/07/2015  Orientation to time 5 5 5   Orientation to Place 5 5 5   Registration 3 3 3   Attention/ Calculation 5 0 0  Recall 3 3 3   Language-  name 2 objects - 0 0  Language- repeat 1 1 1   Language- follow 3 step command - 3 3  Language- read & follow direction - 0 0  Write a sentence - 0 0  Copy design - 0 0  Total score - 20 20  Mini Cog  Mini-Cog screen was completed. Maximum score is 22. A value of 0 denotes this part of the MMSE was not completed or the patient failed this part of the Mini-Cog screening.       Immunizations Immunization History  Administered Date(s) Administered  . Fluad Quad(high Dose 65+) 12/24/2018  . Influenza, High Dose Seasonal PF 12/05/2016  . Influenza,inj,Quad PF,6+ Mos 11/15/2015, 11/27/2017  . Influenza-Unspecified 12/28/2014  . PFIZER SARS-COV-2 Vaccination 03/11/2019, 04/01/2019  . Pneumococcal Conjugate-13 04/03/2014  . Pneumococcal Polysaccharide-23 03/16/2010    TDAP status: Due, Education has been provided regarding the importance of this vaccine. Advised may receive this vaccine at local pharmacy or Health Dept. Aware to provide a copy of the vaccination record if obtained from local pharmacy or Health Dept. Verbalized acceptance and understanding. Flu Vaccine status: due, will be available in the office at the end of August Pneumococcal vaccine status: Up to date Covid-19 vaccine status: Completed vaccines  Qualifies for Shingles Vaccine? Yes  Zostavax completed No   Shingrix Completed?: No.    Education has been provided regarding the importance of this vaccine. Patient has been advised to call insurance company to determine out of pocket expense if they have not yet received this vaccine. Advised may also receive vaccine at local pharmacy or Health Dept. Verbalized acceptance and understanding.  Screening Tests Health Maintenance  Topic Date Due  . DTAP VACCINES (1) Never done  . DTaP/Tdap/Td (1 - Tdap) Never done  . INFLUENZA VACCINE  10/05/2019  . TETANUS/TDAP  10/15/2023 (Originally 04/23/1952)  . DEXA SCAN  Completed  . COVID-19 Vaccine  Completed  . PNA vac Low Risk  Adult  Completed  . FOOT EXAM  Discontinued  . HEMOGLOBIN A1C  Discontinued  . OPHTHALMOLOGY EXAM  Discontinued  . URINE MICROALBUMIN  Discontinued    Health Maintenance  Health Maintenance Due  Topic Date Due  . DTAP VACCINES (1) Never done  . DTaP/Tdap/Td (1 - Tdap) Never done  . INFLUENZA VACCINE  10/05/2019    Colorectal cancer screening: No longer required.  Mammogram status: No longer required.  Bone Density status: declined  Lung Cancer Screening: (Low Dose CT Chest recommended if Age 37-80 years, 30 pack-year currently smoking OR have quit w/in 15 years.) does not qualify.    Additional Screening:  Hepatitis C Screening: does not qualify; Completed N/A  Vision Screening: Recommended annual ophthalmology exams for early detection of glaucoma and other disorders of the eye. Is the patient up to date with their annual eye exam?  No  Who is the provider or what is the name of the office in which the patient attends annual eye exams? In the process of finding an eye doctor  If pt is not established with a provider, would they like to be referred to a provider to establish care? No .   Dental Screening: Recommended annual dental exams for proper oral hygiene  Community Resource Referral / Chronic Care Management: CRR required this visit?  No   CCM required this visit?  No      Plan:     I have personally reviewed and noted the following in the patient's chart:   . Medical and social history . Use of alcohol, tobacco or illicit drugs  . Current medications and supplements . Functional ability and status . Nutritional status . Physical activity . Advanced directives . List of other physicians . Hospitalizations, surgeries, and ER visits in previous 12 months . Vitals . Screenings to include cognitive, depression, and falls . Referrals and appointments  In addition, I have reviewed and discussed with patient certain preventive protocols, quality metrics, and  best practice recommendations. A written personalized care plan for preventive services as well as general preventive health recommendations were provided to patient.   Due to this being a telephonic visit, the after visit summary with patients personalized plan was offered to patient via mail or my-chart. Patient preferred to pick up at office at next visit.   Andrez Grime, LPN   07/21/15

## 2019-10-29 ENCOUNTER — Other Ambulatory Visit: Payer: Self-pay

## 2019-10-29 ENCOUNTER — Encounter: Payer: Self-pay | Admitting: Family Medicine

## 2019-10-29 ENCOUNTER — Telehealth: Payer: Self-pay

## 2019-10-29 ENCOUNTER — Ambulatory Visit (INDEPENDENT_AMBULATORY_CARE_PROVIDER_SITE_OTHER): Payer: Medicare Other | Admitting: Family Medicine

## 2019-10-29 VITALS — BP 118/60 | HR 71 | Temp 97.9°F | Ht 66.25 in | Wt 139.4 lb

## 2019-10-29 DIAGNOSIS — E538 Deficiency of other specified B group vitamins: Secondary | ICD-10-CM

## 2019-10-29 DIAGNOSIS — I442 Atrioventricular block, complete: Secondary | ICD-10-CM

## 2019-10-29 DIAGNOSIS — Z7901 Long term (current) use of anticoagulants: Secondary | ICD-10-CM

## 2019-10-29 DIAGNOSIS — E559 Vitamin D deficiency, unspecified: Secondary | ICD-10-CM

## 2019-10-29 DIAGNOSIS — E611 Iron deficiency: Secondary | ICD-10-CM

## 2019-10-29 DIAGNOSIS — R5382 Chronic fatigue, unspecified: Secondary | ICD-10-CM

## 2019-10-29 DIAGNOSIS — E785 Hyperlipidemia, unspecified: Secondary | ICD-10-CM

## 2019-10-29 DIAGNOSIS — R0609 Other forms of dyspnea: Secondary | ICD-10-CM

## 2019-10-29 DIAGNOSIS — E039 Hypothyroidism, unspecified: Secondary | ICD-10-CM | POA: Diagnosis not present

## 2019-10-29 DIAGNOSIS — M8588 Other specified disorders of bone density and structure, other site: Secondary | ICD-10-CM

## 2019-10-29 DIAGNOSIS — Z0000 Encounter for general adult medical examination without abnormal findings: Secondary | ICD-10-CM

## 2019-10-29 DIAGNOSIS — H8109 Meniere's disease, unspecified ear: Secondary | ICD-10-CM

## 2019-10-29 DIAGNOSIS — I5032 Chronic diastolic (congestive) heart failure: Secondary | ICD-10-CM

## 2019-10-29 DIAGNOSIS — N1832 Chronic kidney disease, stage 3b: Secondary | ICD-10-CM

## 2019-10-29 DIAGNOSIS — I4821 Permanent atrial fibrillation: Secondary | ICD-10-CM

## 2019-10-29 DIAGNOSIS — E038 Other specified hypothyroidism: Secondary | ICD-10-CM

## 2019-10-29 DIAGNOSIS — Z7189 Other specified counseling: Secondary | ICD-10-CM

## 2019-10-29 DIAGNOSIS — L299 Pruritus, unspecified: Secondary | ICD-10-CM

## 2019-10-29 MED ORDER — CYANOCOBALAMIN 1000 MCG/ML IJ SOLN
1000.0000 ug | Freq: Once | INTRAMUSCULAR | Status: AC
  Administered 2019-10-29: 1000 ug via INTRAMUSCULAR

## 2019-10-29 MED ORDER — VITAMIN D3 25 MCG (1000 UT) PO CAPS: 1.0000 | ORAL_CAPSULE | Freq: Every day | ORAL | Status: DC

## 2019-10-29 MED ORDER — HYDROXYZINE HCL 10 MG PO TABS: 10.0000 mg | ORAL_TABLET | Freq: Two times a day (BID) | ORAL | 0 refills | Status: DC | PRN

## 2019-10-29 NOTE — Progress Notes (Signed)
This visit was conducted in person.  BP 118/60 (BP Location: Left Arm, Patient Position: Sitting, Cuff Size: Normal)   Pulse 71   Temp 97.9 F (36.6 C) (Temporal)   Ht 5' 6.25" (1.683 m)   Wt 139 lb 7 oz (63.2 kg)   SpO2 96%   BMI 22.34 kg/m    CC: CPE  Subjective:    Patient ID: Sierra Kaiser, female    DOB: 09-Jun-1933, 86 y.o.   MRN: [id number]  HPI: Sierra Kaiser is a 86 y.o. female presenting on 10/29/2019 for Annual Exam (Prt 2. ) and Pruritis (C/o itching allover, worsened right now.  Has had for several yrs.  Followed by dermatology. )   Saw health advisor earlier this month for medicare wellness visit. Note reviewed.   No exam data present    Clinical Support from 10/15/2019 in [place] at [facility] Total Score 0      Fall Risk  10/15/2019 10/18/2018 01/17/2017 01/12/2017 11/24/2016  Falls in the past year? 0 1 No No No  Number falls in past yr: 0 0 - - -  Injury with Fall? 0 1 - - -  Risk for fall due to : Medication side effect - - - -  Follow up Falls evaluation completed;Falls prevention discussed - - - -    Chronic dizziness in h/o meniere's has seen ENT.   Ongoing pruritis, acutely worse over the last 5 days - managing with benadryl - has needed to increase use up to 1-4 tablets daily. Also can use hydroxyzine and steroid cream with benefit. Antihistamine hasn't helped. Unknown trigger. Has seen derm. Has not schedule derm f/u yet - advised schedule. No fevers/chills, night sweats, weight loss.   Ongoing dyspnea.    Preventative: Colonoscopy 2010 - polyps  COLONOSCOPY WITH PROPOFOL 08/29/2017 - TAs, no f/u recommended Alice Reichert, Benay Pike, MD)  Mammogram 12/2015. She does check breast checks at home without concerns. Encouraged she call and schedule f/u at [facility] - # provided  DEXA 12/2015 - osteopenia. Discussed calcium and vit D dosing as well as regular weight bearing exercise.  Flu shot yearly  [place] 03/2019  x2 Pneumovax 2012, prevnar 2016  Tetanus - unsure  zostavax ~2014  shingrix - discussed Advanced directive discussion: has at home. Sister [name]) would be HCPOA. Will bring me copy.  Seat belt use discussed Sunscreen use discussed. Wants mole on R groin checked.  Non smoker  Alcohol - 1 wine glass every few weeks  Dentist q6 mo  Eye exam - has not seen in 7 yrs Bowel - rare constipation managed with suppository  Bladder - ongoing urge incontinence - wards pad   Lives with husband.  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 Interim medical history since our last visit reviewed. Allergies and medications reviewed and updated. Outpatient Medications Prior to Visit  Medication Sig Dispense Refill  . cyanocobalamin (,VITAMIN B-12,) 1000 MCG/ML injection Inject 1 mL (1,000 mcg total) into the muscle every 30 (thirty) days.    Marland Kitchen ezetimibe (ZETIA) 10 MG tablet Take 1 tablet (10 mg total) by mouth daily. 90 tablet 3  . fluticasone (FLONASE) 50 MCG/ACT nasal spray SPRAY 2 SPRAYS INTO EACH NOSTRIL EVERY  DAY (Patient taking differently: Place 2 sprays into both nostrils daily. Takes as needed) 16 g 3  . furosemide (LASIX) 20 MG tablet Take 1 tablet (20 mg total) by mouth as needed for fluid or edema. 30 tablet 3  . meclizine (ANTIVERT) 25 MG tablet Take 25 mg by mouth 2 (two) times daily.     [name] Glycol-Propyl Glycol (SYSTANE OP) Place 1 drop into both eyes daily as needed (dry eyes).    . Rivaroxaban (XARELTO) 15 MG TABS tablet TAKE 1 TABLET DAILY WITH SUPPER 90 tablet 1  . rosuvastatin (CRESTOR) 20 MG tablet Take 1 tablet (20 mg total) by mouth every other day. 45 tablet 3  . triamcinolone (KENALOG) 0.025 %  ointment Apply 1 application topically 2 (two) times daily.      Facility-Administered Medications Prior to Visit  Medication Dose Route Frequency Provider Last Rate Last Admin  . cyanocobalamin ((VITAMIN B-12)) injection 1,000 mcg  1,000 mcg Intramuscular Once [name], MD         Per HPI unless specifically indicated in ROS section below Review of Systems  Constitutional: Negative for activity change, appetite change, chills, fatigue, fever and unexpected weight change.  HENT: Negative for hearing loss.   Eyes: Negative for visual disturbance.  Respiratory: Positive for shortness of breath. Negative for cough, chest tightness and wheezing.   Cardiovascular: Negative for chest pain, palpitations and leg swelling.  Gastrointestinal: Negative for abdominal distention, abdominal pain, blood in stool, constipation, diarrhea, nausea and vomiting.  Genitourinary: Negative for difficulty urinating and hematuria.  Musculoskeletal: Negative for arthralgias, myalgias and neck pain.  Skin: Negative for rash.       Chronic pruritis  Neurological: Positive for dizziness and light-headedness. Negative for seizures, syncope and headaches.  Hematological: Negative for adenopathy. Does not bruise/bleed easily.  Psychiatric/Behavioral: Negative for dysphoric mood. The patient is nervous/anxious (from itching).    Objective:  BP 118/60 (BP Location: Left Arm, Patient Position: Sitting, Cuff Size: Normal)   Pulse 71   Temp 97.9 F (36.6 C) (Temporal)   Ht 5' 6.25" (1.683 m)   Wt 139 lb 7 oz (63.2 kg)   SpO2 96%   BMI 22.34 kg/m   Wt Readings from Last 3 Encounters:  10/29/19 139 lb 7 oz (63.2 kg)  09/24/19 139 lb (63 kg)  02/19/19 133 lb (60.3 kg)      Physical Exam Vitals and nursing note reviewed.  Constitutional:      General: She is not in acute distress.    Appearance: Normal appearance. She is well-developed. She is not ill-appearing.  HENT:     Head: Normocephalic and  atraumatic.     Right Ear: Hearing, tympanic membrane, ear canal and external ear normal.     Left Ear: Hearing, tympanic membrane, ear canal and external ear normal.  Eyes:     General: No scleral icterus.    Extraocular Movements: Extraocular movements intact.     Conjunctiva/sclera: Conjunctivae normal.     Pupils: Pupils are equal, round, and reactive to light.  Neck:     Thyroid: No thyroid mass, thyromegaly or thyroid tenderness.     Vascular: No carotid bruit.  Cardiovascular:     Rate and Rhythm: Normal rate and regular rhythm.     Pulses: Normal pulses.          Radial pulses are 2+ on the right side and 2+ on the left side.     Heart sounds: Normal heart sounds. No murmur  heard.   Pulmonary:     Effort: Pulmonary effort is normal. No respiratory distress.     Breath sounds: Normal breath sounds. No wheezing, rhonchi or rales.  Abdominal:     General: Abdomen is flat. Bowel sounds are normal. There is no distension.     Palpations: Abdomen is soft. There is no mass.     Tenderness: There is no abdominal tenderness. There is no guarding or rebound.     Hernia: No hernia is present.  Musculoskeletal:        General: Normal range of motion.     Cervical back: Normal range of motion and neck supple.     Right lower leg: No edema.     Left lower leg: No edema.  Lymphadenopathy:     Cervical: No cervical adenopathy.  Skin:    General: Skin is warm and dry.     Findings: Rash present. Rash is purpuric and pustular.     Comments: Few scattered highly pruritic papules to BUE and BLE  Neurological:     General: No focal deficit present.     Mental Status: She is alert and oriented to person, place, and time.     Comments: CN grossly intact, station and gait intact  Psychiatric:        Mood and Affect: Mood normal.        Behavior: Behavior normal.        Thought Content: Thought content normal.        Judgment: Judgment normal.       Results for orders placed or  performed in visit on 10/29/19  TSH  Result Value Ref Range   TSH 5.53 (H) 0.35 - 4.50 uIU/mL  T4, free  Result Value Ref Range   Free T4 0.91 0.60 - 1.60 ng/dL  Renal function panel  Result Value Ref Range   Sodium 142 135 - 145 mEq/L   Potassium 5.2 No hemolysis seen (H) 3.5 - 5.1 mEq/L   Chloride 107 96 - 112 mEq/L   CO2 27 19 - 32 mEq/L   Albumin 4.5 3.5 - 5.2 g/dL   BUN 25 (H) 6 - 23 mg/dL   Creatinine, Ser 1.27 (H) 0.40 - 1.20 mg/dL   Glucose, Bld 91 70 - 99 mg/dL   Phosphorus 4.0 2.3 - 4.6 mg/dL   GFR 40.04 (L) >60.00 mL/min   Calcium 10.1 8.4 - 10.5 mg/dL  Microalbumin / creatinine urine ratio  Result Value Ref Range   Microalb, Ur 5.3 (H) 0.0 - 1.9 mg/dL   Creatinine,U 196.5 mg/dL   Microalb Creat Ratio 2.7 0.0 - 30.0 mg/g  Parathyroid hormone, intact (no Ca)  Result Value Ref Range   PTH 72 (H) 14 - 64 pg/mL   Lab Results  Component Value Date   CHOL 128 10/15/2019   HDL 56.60 10/15/2019   LDLCALC 51 10/15/2019   TRIG 99.0 10/15/2019   CHOLHDL 2 10/15/2019   Depression screen PHQ 2/9 10/15/2019 10/18/2018 01/17/2017 01/12/2017 11/24/2016  Decreased Interest 0 2 0 0 2  Down, Depressed, Hopeless 0 2 0 0 0  PHQ - 2 Score 0 4 0 0 2  Altered sleeping 0 2 0 1 1  Tired, decreased energy 0 3 0 1 2  Change in appetite 0 3 0 1 2  Feeling bad or failure about yourself  0 1 0 0 1  Trouble concentrating 0 1 0 0 1  Moving slowly or fidgety/restless 0 2 0 1 2  Suicidal thoughts 0 0 0 0 0  PHQ-9 Score 0 16 0 4 11  Difficult doing work/chores Not difficult at all - Not difficult at all Somewhat difficult Somewhat difficult    GAD 7 : Generalized Anxiety Score 10/18/2018 03/01/2018  Nervous, Anxious, on Edge 1 1  Control/stop worrying 1 0  Worry too much - different things 1 1  Trouble relaxing 1 0  Restless 1 0  Easily annoyed or irritable 1 2  Afraid - awful might happen 1 1  Total GAD 7 Score 7 5   Assessment & Plan:  This visit occurred during the SARS-CoV-2  public health emergency.  Safety protocols were in place, including screening questions prior to the visit, additional usage of staff PPE, and extensive cleaning of exam room while observing appropriate contact time as indicated for disinfecting solutions.   Problem List Items Addressed This Visit    Vitamin D deficiency    rec start 1000 IU vit D.       Subclinical hypothyroidism    This could contribute to fatigue.  Update TFTs today.       Relevant Orders   TSH (Completed)   T4, free (Completed)   Pruritus    Longstanding for years. ?neurodermatitis. No signs of liver disease. ?thyroid related.  Has previously seen derm.  Ongoing despite benadryl and triamcinolone cream. Will change benadryl to hydroxyzine. Encouraged derm f/u.  Will see if we can add SPEP (MM eval) and ANA (autoimmune evaluation for sjogren, dermatomyositis, scleroderma).       Osteopenia    Repeat next year.       Meniere's disease   Low serum vitamin B12    Continue replacement with monthly b12 shots - one done today.       Iron deficiency    Ferritin again low - with fatigue, dyspnea, pruritis. Will schedule repeat iron infusions (last done 07/2017).       Hyperlipidemia    Good control. Continue zetia daily and crestor QOD dosing.  The ASCVD Risk score Mikey Bussing DC Jr., et al., 2013) failed to calculate for the following reasons:   The 2013 ASCVD risk score is only valid for ages 71 to 52       Health maintenance examination - Primary    Preventative protocols reviewed and updated unless pt declined. Discussed healthy diet and lifestyle.  Overdue for mammogram (last done 2017) - advised she call and schedule appointment.       Complete heart block (HCC)    Has pacer in place.       CKD (chronic kidney disease) stage 3, GFR 30-59 ml/min    Recent renal function deterioration - update levels today as well as other CKD labs.  If persisting, consider renal US.       Relevant Orders   Renal  function panel (Completed)   Microalbumin / creatinine urine ratio (Completed)   Parathyroid hormone, intact (no Ca) (Completed)   Chronic fatigue    Anticipate related to low iron. Await effect of upcoming feraheme infusions      Chronic dyspnea    Chronic. Previously improved after iron infusion - will schedule again given dropping ferritin levels.       Chronic diastolic CHF (congestive heart failure) (HCC)    Seems euvolemic. Ongoing dyspnea.       Chronic anticoagulation    Continues xarelto for h/o afib s/p ablation       Atrial fibrillation, permanent ([place])    Continues  xarelto.       Advanced care planning/counseling discussion    Advanced directive discussion: has at home. Sister [name]) would be HCPOA. Will bring me copy.           Meds ordered this encounter  Medications  . hydrOXYzine (ATARAX/VISTARIL) 10 MG tablet    Sig: Take 1 tablet (10 mg total) by mouth 2 (two) times daily as needed for anxiety (sedation precautions).    Dispense:  30 tablet    Refill:  0    Do not take with benadryl  . Cholecalciferol (VITAMIN D3) 25 MCG (1000 UT) CAPS    Sig: Take 1 capsule (1,000 Units total) by mouth daily.    Dispense:  30 capsule  . cyanocobalamin ((VITAMIN B-12)) injection 1,000 mcg   Orders Placed This Encounter  Procedures  . TSH  . T4, free  . Renal function panel  . Microalbumin / creatinine urine ratio  . Parathyroid hormone, intact (no Ca)    Patient instructions: B12 shot today  Start vitamin D 1000 units daily (over the counter) Iron stores are low again - we will sign you up for another 2 iron infusions.  Recheck thyroid levels today ?underactive thyroid - if staying low, we may trial low dose thyroid replacement.  Try hydroxyzine 1/2-1 tablet twice daily as needed for itch in place of benadryl. Schedule follow up with dermatology.  Last mammogram was 2017. Call to schedule mammogram at your convenience: [facility] at [facility]  [phone] If interested, check with pharmacy about new 2 shot shingles series (shingrix).  Bring me copy of your advanced directive to update your chart. Schedule eye exam.  Schedule follow up with dermatology.   Follow up plan: Return in about 3 months (around 01/29/2020) for follow up visit.  [name], MD

## 2019-10-29 NOTE — Assessment & Plan Note (Addendum)
Preventative protocols reviewed and updated unless pt declined. Discussed healthy diet and lifestyle.  Overdue for mammogram (last done 2017) - advised she call and schedule appointment.

## 2019-10-29 NOTE — Telephone Encounter (Signed)
Placed infusion order form in Dr. Synthia Innocent box.

## 2019-10-29 NOTE — Telephone Encounter (Signed)
Filled and in Lisa's box 

## 2019-10-29 NOTE — Assessment & Plan Note (Addendum)
Continue replacement with monthly b12 shots - one done today.

## 2019-10-29 NOTE — Assessment & Plan Note (Signed)
Advanced directive discussion: has at home. Sister [name]) would be HCPOA. Will bring me copy.

## 2019-10-29 NOTE — Patient Instructions (Addendum)
B12 shot today  Labs today.  Start vitamin D 1000 units daily (over the counter) Iron stores are low again - we will sign you up for another 2 iron infusions.  Recheck thyroid levels today ?underactive thyroid - if staying low, we may trial low dose thyroid replacement.  Try hydroxyzine 1/2-1 tablet twice daily as needed for itch in place of benadryl. Schedule follow up with dermatology.  Last mammogram was 2017. Call to schedule mammogram. [facility] [phone] If interested, check with pharmacy about new 2 shot shingles series (shingrix).  Bring me copy of your advanced directive to update your chart. Schedule eye exam.  Schedule follow up with dermatology.   Health Maintenance After Age 85 After age 19, you are at a higher risk for certain long-term diseases and infections as well as injuries from falls. Falls are a major cause of broken bones and head injuries in people who are older than age 71. Getting regular preventive care can help to keep you healthy and well. Preventive care includes getting regular testing and making lifestyle changes as recommended by your health care provider. Talk with your health care provider about:  Which screenings and tests you should have. A screening is a test that checks for a disease when you have no symptoms.  A diet and exercise plan that is right for you. What should I know about screenings and tests to prevent falls? Screening and testing are the best ways to find a health problem early. Early diagnosis and treatment give you the best chance of managing medical conditions that are common after age 5. Certain conditions and lifestyle choices may make you more likely to have a fall. Your health care provider may recommend:  Regular vision checks. Poor vision and conditions such as cataracts can make you more likely to have a fall. If you wear glasses, make sure to get your prescription updated if your vision changes.  Medicine  review. Work with your health care provider to regularly review all of the medicines you are taking, including over-the-counter medicines. Ask your health care provider about any side effects that may make you more likely to have a fall. Tell your health care provider if any medicines that you take make you feel dizzy or sleepy.  Osteoporosis screening. Osteoporosis is a condition that causes the bones to get weaker. This can make the bones weak and cause them to break more easily.  Blood pressure screening. Blood pressure changes and medicines to control blood pressure can make you feel dizzy.  Strength and balance checks. Your health care provider may recommend certain tests to check your strength and balance while standing, walking, or changing positions.  Foot health exam. Foot pain and numbness, as well as not wearing proper footwear, can make you more likely to have a fall.  Depression screening. You may be more likely to have a fall if you have a fear of falling, feel emotionally low, or feel unable to do activities that you used to do.  Alcohol use screening. Using too much alcohol can affect your balance and may make you more likely to have a fall. What actions can I take to lower my risk of falls? General instructions  Talk with your health care provider about your risks for falling. Tell your health care provider if: ? You fall. Be sure to tell your health care provider about all falls, even ones that seem minor. ? You feel dizzy, sleepy, or off-balance.  Take over-the-counter and prescription medicines only as told by your health care provider. These include any supplements.  Eat a healthy diet and maintain a healthy weight. A healthy diet includes low-fat dairy products, low-fat (lean) meats, and fiber from whole grains, beans, and lots of fruits and vegetables. Home safety  Remove any tripping hazards, such as rugs, cords, and clutter.  Install safety equipment such as grab  bars in bathrooms and safety rails on stairs.  Keep rooms and walkways well-lit. Activity   Follow a regular exercise program to stay fit. This will help you maintain your balance. Ask your health care provider what types of exercise are appropriate for you.  If you need a cane or walker, use it as recommended by your health care provider.  Wear supportive shoes that have nonskid soles. Lifestyle  Do not drink alcohol if your health care provider tells you not to drink.  If you drink alcohol, limit how much you have: ? 0-1 drink a day for women. ? 0-2 drinks a day for men.  Be aware of how much alcohol is in your drink. In the [place] one drink equals one typical bottle of beer (12 oz), one-half glass of wine (5 oz), or one shot of hard liquor (1 oz).  Do not use any products that contain nicotine or tobacco, such as cigarettes and e-cigarettes. If you need help quitting, ask your health care provider. Summary  Having a healthy lifestyle and getting preventive care can help to protect your health and wellness after age 67.  Screening and testing are the best way to find a health problem early and help you avoid having a fall. Early diagnosis and treatment give you the best chance for managing medical conditions that are more common for people who are older than age 60.  Falls are a major cause of broken bones and head injuries in people who are older than age 20. Take precautions to prevent a fall at home.  Work with your health care provider to learn what changes you can make to improve your health and wellness and to prevent falls. This information is not intended to replace advice given to you by your health care provider. Make sure you discuss any questions you have with your health care provider. Document Revised: 06/13/2018 Document Reviewed: 01/03/2017 Elsevier Patient Education  2020 [employer].

## 2019-10-30 ENCOUNTER — Other Ambulatory Visit: Payer: Self-pay | Admitting: Cardiovascular Disease

## 2019-10-30 DIAGNOSIS — E559 Vitamin D deficiency, unspecified: Secondary | ICD-10-CM | POA: Insufficient documentation

## 2019-10-30 DIAGNOSIS — E038 Other specified hypothyroidism: Secondary | ICD-10-CM | POA: Insufficient documentation

## 2019-10-30 LAB — RENAL FUNCTION PANEL
Albumin: 4.5 g/dL (ref 3.5–5.2)
BUN: 25 mg/dL — ABNORMAL HIGH (ref 6–23)
CO2: 27 mEq/L (ref 19–32)
Calcium: 10.1 mg/dL (ref 8.4–10.5)
Chloride: 107 mEq/L (ref 96–112)
Creatinine, Ser: 1.27 mg/dL — ABNORMAL HIGH (ref 0.40–1.20)
GFR: 40.04 mL/min — ABNORMAL LOW (ref >60.00)
Glucose, Bld: 91 mg/dL (ref 70–99)
Phosphorus: 4 mg/dL (ref 2.3–4.6)
Potassium: 5.2 mEq/L — ABNORMAL HIGH (ref 3.5–5.1)
Sodium: 142 mEq/L (ref 135–145)

## 2019-10-30 LAB — MICROALBUMIN / CREATININE URINE RATIO
Creatinine,U: 196.5 mg/dL
Microalb Creat Ratio: 2.7 mg/g (ref 0.0–30.0)
Microalb, Ur: 5.3 mg/dL — ABNORMAL HIGH (ref 0.0–1.9)

## 2019-10-30 LAB — TSH: TSH: 5.53 u[IU]/mL — ABNORMAL HIGH (ref 0.35–4.50)

## 2019-10-30 LAB — T4, FREE: Free T4: 0.91 ng/dL (ref 0.60–1.60)

## 2019-10-30 MED ORDER — EZETIMIBE 10 MG PO TABS
10.0000 mg | ORAL_TABLET | Freq: Every day | ORAL | 3 refills | Status: DC
Start: 2019-10-30 — End: 2020-11-09

## 2019-10-30 NOTE — Assessment & Plan Note (Signed)
Continues xarelto for h/o afib s/p ablation

## 2019-10-30 NOTE — Assessment & Plan Note (Signed)
Anticipate related to low iron. Await effect of upcoming feraheme infusions

## 2019-10-30 NOTE — Assessment & Plan Note (Signed)
Good control. Continue zetia daily and crestor QOD dosing.  The ASCVD Risk score Mikey Bussing DC Jr., et al., 2013) failed to calculate for the following reasons:   The 2013 ASCVD risk score is only valid for ages 63 to 81

## 2019-10-30 NOTE — Assessment & Plan Note (Signed)
Continues xarelto 

## 2019-10-30 NOTE — Assessment & Plan Note (Addendum)
Longstanding for years. ?neurodermatitis. No signs of liver disease. ?thyroid related.  Has previously seen derm.  Ongoing despite benadryl and triamcinolone cream. Will change benadryl to hydroxyzine. Encouraged derm f/u.  Will see if we can add SPEP (MM eval) and ANA (autoimmune evaluation for sjogren, dermatomyositis, scleroderma).

## 2019-10-30 NOTE — Assessment & Plan Note (Signed)
rec start 1000 IU vit D.

## 2019-10-30 NOTE — Assessment & Plan Note (Signed)
Ferritin again low - with fatigue, dyspnea, pruritis. Will schedule repeat iron infusions (last done 07/2017).

## 2019-10-30 NOTE — Assessment & Plan Note (Signed)
Has pacer in place.

## 2019-10-30 NOTE — Assessment & Plan Note (Signed)
Chronic. Previously improved after iron infusion - will schedule again given dropping ferritin levels.

## 2019-10-30 NOTE — Assessment & Plan Note (Signed)
This could contribute to fatigue.  Update TFTs today.

## 2019-10-30 NOTE — Telephone Encounter (Signed)
*  STAT* If patient is at the pharmacy, call can be transferred to refill team.   1. Which medications need to be refilled? (please list name of each medication and dose if known) ezetimibe 10 mg  2. Which pharmacy/location (including street and city if local pharmacy) is medication to be sent to? [facility] on [address]  3. Do they need a 30 day or 90 day supply? [place]

## 2019-10-30 NOTE — Assessment & Plan Note (Addendum)
Recent renal function deterioration - update levels today as well as other CKD labs.  If persisting, consider renal US.

## 2019-10-30 NOTE — Assessment & Plan Note (Signed)
Seems euvolemic. Ongoing dyspnea.

## 2019-10-30 NOTE — Assessment & Plan Note (Signed)
Repeat next year.

## 2019-10-31 NOTE — Telephone Encounter (Signed)
Lvm at [place] [facility] asking for a call back.  Need to schedule pt for iron infusion asap.

## 2019-10-31 NOTE — Telephone Encounter (Signed)
Spoke with [employer] to do pre-cert for infusion.  Told none required.  Ref #:  [id number].

## 2019-11-03 ENCOUNTER — Telehealth: Payer: Self-pay | Admitting: Family Medicine

## 2019-11-03 NOTE — Telephone Encounter (Signed)
Received message from Santiago Glad at [facility] stating pt has been scheduled on 11/07/19 at 11:00.  Theressa Millard TE, 11/03/19]  Spoke with notifying her of appt.  Pt verbalizes understanding.  I also provided phn # [phone], in case pt needs to r/s or c/x.

## 2019-11-03 NOTE — Telephone Encounter (Signed)
Attempted to schedule infusion.  Got vm.  Will try again later.

## 2019-11-03 NOTE — Telephone Encounter (Signed)
Left another vm with [facility] asking for a call back.  Need to get pt scheduled for iron infusion asap.  [Pt info already faxed.  Also, no pre-cert needed, per insurance co.]

## 2019-11-03 NOTE — Telephone Encounter (Signed)
[facility] at [facility], called to let Sierra Kaiser know she scheduled patient on 11/07/19 at 11:00.

## 2019-11-03 NOTE — Telephone Encounter (Signed)
Noted. Pt aware.  Sierra Kaiser, TE, 10/29/19]

## 2019-11-06 ENCOUNTER — Telehealth: Payer: Self-pay | Admitting: Family Medicine

## 2019-11-06 NOTE — Telephone Encounter (Signed)
[facility] called.  Patient has an appointment tomorrow at 11 am.  They don't have an order for the appointment. Please put order in Epic. Patient's scheduled for IV Iron.

## 2019-11-06 NOTE — Telephone Encounter (Signed)
Original order faxed on 10/31/19 (see TE, 10/29/19).  Refaxed order today.

## 2019-11-07 ENCOUNTER — Other Ambulatory Visit: Payer: Self-pay

## 2019-11-07 ENCOUNTER — Encounter (HOSPITAL_COMMUNITY)
Admission: RE | Admit: 2019-11-07 | Discharge: 2019-11-07 | Disposition: A | Discharge status: Home / Self Care | Payer: Medicare Other | Source: Ambulatory Visit | Attending: Family Medicine | Admitting: Family Medicine

## 2019-11-07 DIAGNOSIS — E611 Iron deficiency: Secondary | ICD-10-CM | POA: Diagnosis not present

## 2019-11-07 DIAGNOSIS — R5383 Other fatigue: Secondary | ICD-10-CM | POA: Insufficient documentation

## 2019-11-07 LAB — PROTEIN,TOTAL AND ELECTROPHOR W/IFE
Albumin ELP: 4.2 g/dL (ref 3.8–4.8)
Alpha 1: 0.3 g/dL (ref 0.2–0.3)
Alpha 2: 0.7 g/dL (ref 0.5–0.9)
Beta 2: 0.3 g/dL (ref 0.2–0.5)
Beta Globulin: 0.5 g/dL (ref 0.4–0.6)
Gamma Globulin: 0.7 g/dL — ABNORMAL LOW (ref 0.8–1.7)
Total Protein: 6.5 g/dL (ref 6.1–8.1)

## 2019-11-07 LAB — ANTI-NUCLEAR AB-TITER (ANA TITER): ANA Titer 1: 1:320 {titer} — ABNORMAL HIGH

## 2019-11-07 LAB — PARATHYROID HORMONE, INTACT (NO CA): PTH: 72 pg/mL — ABNORMAL HIGH (ref 14–64)

## 2019-11-07 LAB — ANA: Anti Nuclear Antibody (ANA): POSITIVE — AB

## 2019-11-07 MED ORDER — SODIUM CHLORIDE 0.9 % IV SOLN
510.0000 mg | INTRAVENOUS | Status: DC
  Filled 2019-11-07: qty 17

## 2019-11-07 MED ORDER — SODIUM CHLORIDE 0.9 % IV SOLN
510.0000 mg | INTRAVENOUS | Status: DC
  Administered 2019-11-07: 510 mg via INTRAVENOUS
  Filled 2019-11-07: qty 510

## 2019-11-08 ENCOUNTER — Other Ambulatory Visit: Payer: Self-pay | Admitting: Family Medicine

## 2019-11-08 ENCOUNTER — Encounter: Payer: Self-pay | Admitting: Family Medicine

## 2019-11-08 DIAGNOSIS — D472 Monoclonal gammopathy: Secondary | ICD-10-CM | POA: Insufficient documentation

## 2019-11-08 DIAGNOSIS — N183 Chronic kidney disease, stage 3 unspecified: Secondary | ICD-10-CM

## 2019-11-08 DIAGNOSIS — L299 Pruritus, unspecified: Secondary | ICD-10-CM

## 2019-11-08 DIAGNOSIS — R768 Other specified abnormal immunological findings in serum: Secondary | ICD-10-CM | POA: Insufficient documentation

## 2019-11-11 ENCOUNTER — Telehealth: Payer: Self-pay

## 2019-11-11 NOTE — Telephone Encounter (Signed)
Spoke to pt

## 2019-11-11 NOTE — Telephone Encounter (Signed)
Lvm asking pt to call back.  Need to relay most recent results and Dr. Synthia Innocent message.   Labs & msg: Your ANA returned elevated.  This is a nonspecific test looking for autoimmune conditions.  One type of protein in the blood also returned elevated.  For this, I would like you to collect a 24-hour urine test looking for a specific type of protein in urine.  Come in to the lab to pick up jug to collect the urine.  I have also ordered kidney ultrasound to further evaluate for kidney disease.

## 2019-11-11 NOTE — Telephone Encounter (Signed)
Noted  

## 2019-11-12 ENCOUNTER — Ambulatory Visit (INDEPENDENT_AMBULATORY_CARE_PROVIDER_SITE_OTHER): Payer: Medicare Other | Admitting: *Deleted

## 2019-11-12 DIAGNOSIS — I4821 Permanent atrial fibrillation: Secondary | ICD-10-CM

## 2019-11-12 LAB — CUP PACEART REMOTE DEVICE CHECK
Battery Remaining Longevity: 139 mo
Battery Remaining Percentage: 95.5 %
Battery Voltage: 2.98 V
Brady Statistic RV Percent Paced: 99 %
Date Time Interrogation Session: 20210908020016
Implantable Lead Implant Date: 20160217
Implantable Lead Location: 753860
Implantable Lead Model: 1948
Implantable Pulse Generator Implant Date: 20160217
Lead Channel Impedance Value: 560 Ohm
Lead Channel Pacing Threshold Amplitude: 0.625 V
Lead Channel Pacing Threshold Pulse Width: 0.4 ms
Lead Channel Sensing Intrinsic Amplitude: 10.1 mV
Lead Channel Setting Pacing Amplitude: 0.875
Lead Channel Setting Pacing Pulse Width: 0.4 ms
Lead Channel Setting Sensing Sensitivity: 2.5 mV
Pulse Gen Model: 2240
Pulse Gen Serial Number: 3050195

## 2019-11-13 ENCOUNTER — Other Ambulatory Visit: Payer: Medicare Other

## 2019-11-13 ENCOUNTER — Other Ambulatory Visit: Payer: Self-pay | Admitting: Family Medicine

## 2019-11-13 DIAGNOSIS — D472 Monoclonal gammopathy: Secondary | ICD-10-CM

## 2019-11-13 NOTE — Progress Notes (Signed)
Remote pacemaker transmission.   

## 2019-11-14 ENCOUNTER — Other Ambulatory Visit: Payer: Self-pay

## 2019-11-14 ENCOUNTER — Encounter (HOSPITAL_COMMUNITY)
Admission: RE | Admit: 2019-11-14 | Discharge: 2019-11-14 | Disposition: A | Discharge status: Home / Self Care | Payer: Medicare Other | Source: Ambulatory Visit | Attending: Family Medicine | Admitting: Family Medicine

## 2019-11-14 DIAGNOSIS — E611 Iron deficiency: Secondary | ICD-10-CM | POA: Diagnosis not present

## 2019-11-14 MED ORDER — SODIUM CHLORIDE 0.9 % IV SOLN
510.0000 mg | INTRAVENOUS | Status: AC
  Administered 2019-11-14: 510 mg via INTRAVENOUS
  Filled 2019-11-14: qty 17

## 2019-11-16 LAB — KAPPA/LAMBDA LIGHT CHAINS, FREE, WITH RATIO, 24HR. URINE
KAPPA Ligh Chain, Free U: 30.11 mg/L (ref <=32.90)
Kappa/Lambda,Free Ratio: 11.07 — ABNORMAL HIGH (ref <=8.69)
Lambda Light Chain, Free U: 2.72 mg/L (ref <=3.79)

## 2019-11-17 ENCOUNTER — Other Ambulatory Visit: Payer: Self-pay | Admitting: Cardiovascular Disease

## 2019-11-17 MED ORDER — RIVAROXABAN 15 MG PO TABS: ORAL_TABLET | ORAL | 1 refills | Status: DC

## 2019-11-17 NOTE — Telephone Encounter (Signed)
Pt's age 86, wt 63.2 kg, SCr 1.41, CrCl 29.63, last ov w/ TG 02/19/19. Xarelto refill sent in as requested.

## 2019-11-17 NOTE — Telephone Encounter (Signed)
Please review for refill. Thank!

## 2019-11-17 NOTE — Telephone Encounter (Signed)
*  STAT* If patient is at the pharmacy, call can be transferred to refill team.   1. Which medications need to be refilled? (please list name of each medication and dose if known)   Xarelto 15 mg po q d   2. Which pharmacy/location (including street and city if local pharmacy) is medication to be sent to?  [facility] [address] [place]   3. Do they need a 30 day or 90 day supply? [place]

## 2019-11-19 ENCOUNTER — Other Ambulatory Visit: Payer: Self-pay

## 2019-11-19 ENCOUNTER — Ambulatory Visit
Admission: RE | Admit: 2019-11-19 | Discharge: 2019-11-19 | Disposition: A | Discharge status: Home / Self Care | Payer: Medicare Other | Source: Ambulatory Visit | Attending: Family Medicine | Admitting: Family Medicine

## 2019-11-19 DIAGNOSIS — N183 Chronic kidney disease, stage 3 unspecified: Secondary | ICD-10-CM | POA: Insufficient documentation

## 2019-11-27 ENCOUNTER — Ambulatory Visit: Payer: Medicare Other

## 2019-12-02 ENCOUNTER — Other Ambulatory Visit (INDEPENDENT_AMBULATORY_CARE_PROVIDER_SITE_OTHER): Payer: Medicare Other

## 2019-12-02 ENCOUNTER — Encounter: Payer: Self-pay | Admitting: Family Medicine

## 2019-12-02 ENCOUNTER — Ambulatory Visit (INDEPENDENT_AMBULATORY_CARE_PROVIDER_SITE_OTHER): Payer: Medicare Other | Admitting: Family Medicine

## 2019-12-02 ENCOUNTER — Other Ambulatory Visit: Payer: Self-pay

## 2019-12-02 VITALS — BP 136/70 | HR 73 | Temp 97.9°F | Ht 66.25 in | Wt 139.4 lb

## 2019-12-02 DIAGNOSIS — Z950 Presence of cardiac pacemaker: Secondary | ICD-10-CM

## 2019-12-02 DIAGNOSIS — E039 Hypothyroidism, unspecified: Secondary | ICD-10-CM

## 2019-12-02 DIAGNOSIS — N1832 Chronic kidney disease, stage 3b: Secondary | ICD-10-CM

## 2019-12-02 DIAGNOSIS — E538 Deficiency of other specified B group vitamins: Secondary | ICD-10-CM

## 2019-12-02 DIAGNOSIS — Z23 Encounter for immunization: Secondary | ICD-10-CM | POA: Diagnosis not present

## 2019-12-02 DIAGNOSIS — R0609 Other forms of dyspnea: Secondary | ICD-10-CM

## 2019-12-02 DIAGNOSIS — E611 Iron deficiency: Secondary | ICD-10-CM

## 2019-12-02 DIAGNOSIS — D472 Monoclonal gammopathy: Secondary | ICD-10-CM

## 2019-12-02 DIAGNOSIS — I4821 Permanent atrial fibrillation: Secondary | ICD-10-CM

## 2019-12-02 DIAGNOSIS — L299 Pruritus, unspecified: Secondary | ICD-10-CM

## 2019-12-02 DIAGNOSIS — R5382 Chronic fatigue, unspecified: Secondary | ICD-10-CM | POA: Diagnosis not present

## 2019-12-02 DIAGNOSIS — R768 Other specified abnormal immunological findings in serum: Secondary | ICD-10-CM | POA: Diagnosis not present

## 2019-12-02 DIAGNOSIS — E038 Other specified hypothyroidism: Secondary | ICD-10-CM

## 2019-12-02 DIAGNOSIS — I7300 Raynaud's syndrome without gangrene: Secondary | ICD-10-CM | POA: Insufficient documentation

## 2019-12-02 LAB — BASIC METABOLIC PANEL
BUN: 25 mg/dL — ABNORMAL HIGH (ref 6–23)
CO2: 26 mEq/L (ref 19–32)
Calcium: 9.6 mg/dL (ref 8.4–10.5)
Chloride: 108 mEq/L (ref 96–112)
Creatinine, Ser: 1.3 mg/dL — ABNORMAL HIGH (ref 0.40–1.20)
GFR: 38.97 mL/min — ABNORMAL LOW (ref >60.00)
Glucose, Bld: 81 mg/dL (ref 70–99)
Potassium: 4.2 mEq/L (ref 3.5–5.1)
Sodium: 141 mEq/L (ref 135–145)

## 2019-12-02 LAB — C-REACTIVE PROTEIN: CRP: <1 mg/dL (ref 0.5–20.0)

## 2019-12-02 MED ORDER — CYANOCOBALAMIN 1000 MCG/ML IJ SOLN
1000.0000 ug | Freq: Once | INTRAMUSCULAR | Status: AC
  Administered 2019-12-02: 1000 ug via INTRAMUSCULAR

## 2019-12-02 NOTE — Assessment & Plan Note (Addendum)
B12 shot today.  Continue B12 monthly shots.  Oral replacement was not effective (2019)

## 2019-12-02 NOTE — Patient Instructions (Addendum)
B12 shot today Flu shot today  Labs today.  We will be in touch with results - which will determine referral to blood doctor or autoimmune doctor.

## 2019-12-02 NOTE — Progress Notes (Signed)
This visit was conducted in person.  BP 136/70 (BP Location: Right Arm, Patient Position: Sitting, Cuff Size: Normal)   Pulse 73   Temp 97.9 F (36.6 C) (Temporal)   Ht 5' 6.25" (1.683 m)   Wt 139 lb 6 oz (63.2 kg)   SpO2 97%   BMI 22.33 kg/m    CC: 1 mo f/u visit  Subjective:    Patient ID: Sierra Kaiser, female    DOB: Jul 17, 1933, 86 y.o.   MRN: [id number]  HPI: Sierra Kaiser is a 86 y.o. female presenting on 12/02/2019 for Numbness (C/o numbness in bilateral hands.  Started yrs ago.  Also, c/o hands peeling. Pt accompanied by sister, [name]- temp 97.6.) and Shortness of Breath (C/o SOB on and off for yrs. )   See prior note for details. Here with sister today who is concerned for autoimmune condition.  Seen here last month for physical, noted ongoing pruritis previously saw derm for this. This seemed to resolve after iron infusion (see below).   Work up revealed mild hyperkalemia (needs repeated), CKD stage 3 (GFR 40), borderline low thyroid (TSH 5.5, fT4 0.91). SPEP showed faint IgG (lambda) monoclonal Ig and 24hr urine protein collection showed normal kappa/lambda light chains but elevated ration to 11.  Iron stores were low (ferritin 8.7) - s/p IV feraheme infusion x2. She feels this helped pruritis which has since resolved. However chronic dyspnea and fatigue have persisted.   Renal US 11/2019 returned reassuringly normal.   Notes ongoing hand numbness associated with peeling rash which just recently started.  Ongoing marked fatigue as well as chronic dyspnea.  No weight loss. No night sweats. No swollen glands.  No malar rash or shawl sign.   Sister endorses longstanding h/o flares of malaise over the years.   Borderline hypothyroid - endorses constipation and fatigue, but no other hypothyroid symptoms.      Relevant past medical, surgical, family and social history reviewed and updated as indicated. Interim medical history since our last visit  reviewed. Allergies and medications reviewed and updated. Outpatient Medications Prior to Visit  Medication Sig Dispense Refill  . Cholecalciferol (VITAMIN D3) 25 MCG (1000 UT) CAPS Take 1 capsule (1,000 Units total) by mouth daily. 30 capsule   . cyanocobalamin (,VITAMIN B-12,) 1000 MCG/ML injection Inject 1 mL (1,000 mcg total) into the muscle every 30 (thirty) days.    Marland Kitchen ezetimibe (ZETIA) 10 MG tablet Take 1 tablet (10 mg total) by mouth daily. 90 tablet 3  . fluticasone (FLONASE) 50 MCG/ACT nasal spray SPRAY 2 SPRAYS INTO EACH NOSTRIL EVERY DAY (Patient taking differently: Place 2 sprays into both nostrils daily. Takes as needed) 16 g 3  . furosemide (LASIX) 20 MG tablet Take 1 tablet (20 mg total) by mouth as needed for fluid or edema. 30 tablet 3  . hydrOXYzine (ATARAX/VISTARIL) 10 MG tablet Take 1 tablet (10 mg total) by mouth 2 (two) times daily as needed for anxiety (sedation precautions). (Patient not taking: Reported on 12/02/2019) 30 tablet 0  . meclizine (ANTIVERT) 25 MG tablet Take 25 mg by mouth 2 (two) times daily.     [name] Glycol-Propyl Glycol (SYSTANE OP) Place 1 drop into both eyes daily as needed (dry eyes).    . Rivaroxaban (XARELTO) 15 MG TABS tablet TAKE 1 TABLET DAILY WITH SUPPER 90 tablet 1  . rosuvastatin (CRESTOR) 20 MG tablet Take 1 tablet (20 mg total) by mouth every other day. 45 tablet 3  . triamcinolone (KENALOG)  0.025 % ointment Apply 1 application topically 2 (two) times daily.      Facility-Administered Medications Prior to Visit  Medication Dose Route Frequency Provider Last Rate Last Admin  . cyanocobalamin ((VITAMIN B-12)) injection 1,000 mcg  1,000 mcg Intramuscular Once [name], MD         Per HPI unless specifically indicated in ROS section below Review of Systems Objective:  BP 136/70 (BP Location: Right Arm, Patient Position: Sitting, Cuff Size: Normal)   Pulse 73   Temp 97.9 F (36.6 C) (Temporal)   Ht 5' 6.25" (1.683 m)   Wt  139 lb 6 oz (63.2 kg)   SpO2 97%   BMI 22.33 kg/m   Wt Readings from Last 3 Encounters:  12/02/19 139 lb 6 oz (63.2 kg)  10/29/19 139 lb 7 oz (63.2 kg)  09/24/19 139 lb (63 kg)      Physical Exam Vitals and nursing note reviewed.  Constitutional:      Appearance: Normal appearance. She is not ill-appearing.  Eyes:     Extraocular Movements: Extraocular movements intact.     Pupils: Pupils are equal, round, and reactive to light.  Neck:     Thyroid: No thyroid mass or thyromegaly.  Cardiovascular:     Rate and Rhythm: Normal rate and regular rhythm.     Pulses: Normal pulses.     Heart sounds: Normal heart sounds. No murmur heard.   Pulmonary:     Effort: Pulmonary effort is normal. No respiratory distress.     Breath sounds: Normal breath sounds. No wheezing, rhonchi or rales.  Abdominal:     General: Abdomen is flat. Bowel sounds are normal. There is no distension.     Palpations: Abdomen is soft. There is no hepatomegaly, splenomegaly or mass.     Tenderness: There is no abdominal tenderness. There is no guarding or rebound. Negative signs include Murphy's sign.     Hernia: No hernia is present.  Musculoskeletal:        General: Normal range of motion.     Cervical back: Normal range of motion and neck supple.     Right lower leg: No edema.  Skin:    General: Skin is warm and dry.     Capillary Refill: Capillary refill takes less than 2 seconds.     Findings: Rash present. No bruising or erythema.     Comments: Dry peeling skin to bilateral hands without erythema or vesicles  Neurological:     General: No focal deficit present.     Mental Status: She is alert and oriented to person, place, and time.     Comments: Neg tinel and phalen. Sensation BUE intact to light touch  Psychiatric:        Mood and Affect: Mood normal.        Behavior: Behavior normal.       Results for orders placed or performed in visit on 77/41/28  Basic metabolic panel  Result Value Ref  Range   Sodium 141 135 - 145 mEq/L   Potassium 4.2 3.5 - 5.1 mEq/L   Chloride 108 96 - 112 mEq/L   CO2 26 19 - 32 mEq/L   Glucose, Bld 81 70 - 99 mg/dL   BUN 25 (H) 6 - 23 mg/dL   Creatinine, Ser 1.30 (H) 0.40 - 1.20 mg/dL   GFR 38.97 (L) >60.00 mL/min   Calcium 9.6 8.4 - 10.5 mg/dL  Anti-DNA antibody, double-stranded  Result Value Ref Range  ds DNA Ab <1 IU/mL  C3 complement  Result Value Ref Range   C3 Complement 134 mg/dL  C4 complement  Result Value Ref Range   C4 Complement 37 mg/dL  RNP Antibody  Result Value Ref Range   Ribonucleic Protein(ENA) Antibody, IgG <1.0 NEG <1.0 NEG AI   Lab Results  Component Value Date   WBC 6.5 10/15/2019   HGB 14.0 10/15/2019   HCT 41.0 10/15/2019   MCV 93.7 10/15/2019   PLT 183.0 10/15/2019    US Renal CLINICAL DATA:  Chronic kidney disease.  EXAM: RENAL / URINARY TRACT ULTRASOUND COMPLETE  COMPARISON:  CT abdomen pelvis dated September 29, 2015.  FINDINGS: Right Kidney:  Renal measurements: 9.3 x 3.9 x 4.8 cm = volume: 91 mL. Echogenicity within normal limits. No mass or hydronephrosis visualized.  Left Kidney:  Renal measurements: 8.5 x 4.3 x 3.9 cm = volume: 74 mL. Echogenicity within normal limits. No mass or hydronephrosis visualized.  Bladder:  Decompressed.  Other:  1.2 cm simple cyst in the right hepatic lobe.  IMPRESSION: 1. Normal renal ultrasound.  Electronically Signed   By: Titus Dubin M.D.   On: 11/20/2019 15:54  Assessment & Plan:  This visit occurred during the SARS-CoV-2 public health emergency.  Safety protocols were in place, including screening questions prior to the visit, additional usage of staff PPE, and extensive cleaning of exam room while observing appropriate contact time as indicated for disinfecting solutions.   Problem List Items Addressed This Visit    Subclinical hypothyroidism   S/P placement of cardiac pacemaker   Raynaud's disease    Endorses longstanding history of this.        Pruritus    Improved after iron infusion.       Relevant Orders   Ambulatory referral to Hematology   Positive ANA (antinuclear antibody)    High titer. Check for lupus with C3/4 levels and anti-dsDNA.       Relevant Orders   Basic metabolic panel (Completed)   Anti-DNA antibody, double-stranded (Completed)   C3 complement (Completed)   C4 complement (Completed)   Low serum vitamin B12    B12 shot today.  Continue B12 monthly shots.  Oral replacement was not effective (2019)      Iron deficiency    Feraheme infusion x2 earlier this month.  Has significantly helped pruritis but not so much fatigue and dyspnea.      Relevant Orders   Ambulatory referral to Hematology   IgG lambda monoclonal gammopathy    Faint IgG lambda monoclonal Ig on SPEP last month, with slightly elevated K/L ratio in 24hr urine. ?relation to current symptomatology - will refer to hematology for further evaluation.       Relevant Orders   Ambulatory referral to Hematology   CKD (chronic kidney disease) stage 3, GFR 30-59 ml/min   Chronic fatigue - Primary    Patient/sister endorse waxing/waning episodes of fatigue and malaise throughout the years. ?autoimmune vs rheumatologic etiology - discussed possible further evaluation of both.       Relevant Orders   Ambulatory referral to Hematology   Chronic dyspnea    Has had unrevealing pulm and cards workup in the past for this.       Atrial fibrillation, permanent ([place])    Continues xarelto.        Other Visit Diagnoses    Need for influenza vaccination       Relevant Orders   Flu Vaccine QUAD High Dose(Fluad) (Completed)  Meds ordered this encounter  Medications  . cyanocobalamin ((VITAMIN B-12)) injection 1,000 mcg   Orders Placed This Encounter  Procedures  . Flu Vaccine QUAD High Dose(Fluad)  . Basic metabolic panel  . Anti-DNA antibody, double-stranded  . C3 complement  . C4 complement  . RNP Antibody  . Ambulatory  referral to Hematology    Referral Priority:   Routine    Referral Type:   Consultation    Referral Reason:   Specialty Services Required    Requested Specialty:   Oncology    Number of Visits Requested:   1    Patient Instructions  B12 shot today Flu shot today  Labs today.  We will be in touch with results - which will determine referral to blood doctor or autoimmune doctor.    Follow up plan: Return if symptoms worsen or fail to improve.  [name], MD

## 2019-12-03 LAB — C4 COMPLEMENT: C4 Complement: 37 mg/dL

## 2019-12-03 LAB — C3 COMPLEMENT: C3 Complement: 134 mg/dL

## 2019-12-03 LAB — RNP ANTIBODY: Ribonucleic Protein(ENA) Antibody, IgG: <1 AI

## 2019-12-03 LAB — ANTI-DNA ANTIBODY, DOUBLE-STRANDED: ds DNA Ab: <1 [IU]/mL

## 2019-12-04 DIAGNOSIS — Z950 Presence of cardiac pacemaker: Secondary | ICD-10-CM | POA: Insufficient documentation

## 2019-12-04 NOTE — Assessment & Plan Note (Signed)
High titer. Check for lupus with C3/4 levels and anti-dsDNA.

## 2019-12-04 NOTE — Assessment & Plan Note (Signed)
Improved after iron infusion.

## 2019-12-04 NOTE — Assessment & Plan Note (Signed)
Faint IgG lambda monoclonal Ig on SPEP last month, with slightly elevated K/L ratio in 24hr urine. ?relation to current symptomatology - will refer to hematology for further evaluation.

## 2019-12-04 NOTE — Assessment & Plan Note (Addendum)
Patient/sister endorse waxing/waning episodes of fatigue and malaise throughout the years. ?autoimmune vs rheumatologic etiology - discussed possible further evaluation of both.

## 2019-12-04 NOTE — Assessment & Plan Note (Signed)
Endorses longstanding history of this.

## 2019-12-04 NOTE — Assessment & Plan Note (Signed)
Has had unrevealing pulm and cards workup in the past for this.

## 2019-12-04 NOTE — Assessment & Plan Note (Signed)
Continues xarelto 

## 2019-12-04 NOTE — Assessment & Plan Note (Signed)
Feraheme infusion x2 earlier this month.  Has significantly helped pruritis but not so much fatigue and dyspnea.

## 2019-12-11 ENCOUNTER — Inpatient Hospital Stay: Payer: Medicare Other

## 2019-12-11 ENCOUNTER — Other Ambulatory Visit: Payer: Self-pay

## 2019-12-11 ENCOUNTER — Encounter: Payer: Self-pay | Admitting: Oncology

## 2019-12-11 ENCOUNTER — Inpatient Hospital Stay: Payer: Medicare Other | Attending: Oncology | Admitting: Oncology

## 2019-12-11 VITALS — BP 135/78 | HR 71 | Temp 96.8°F | Resp 16 | Wt 140.9 lb

## 2019-12-11 DIAGNOSIS — Z800 Family history of malignant neoplasm of digestive organs: Secondary | ICD-10-CM | POA: Insufficient documentation

## 2019-12-11 DIAGNOSIS — E038 Other specified hypothyroidism: Secondary | ICD-10-CM | POA: Insufficient documentation

## 2019-12-11 DIAGNOSIS — Z803 Family history of malignant neoplasm of breast: Secondary | ICD-10-CM | POA: Insufficient documentation

## 2019-12-11 DIAGNOSIS — N183 Chronic kidney disease, stage 3 unspecified: Secondary | ICD-10-CM | POA: Diagnosis not present

## 2019-12-11 DIAGNOSIS — Z8249 Family history of ischemic heart disease and other diseases of the circulatory system: Secondary | ICD-10-CM | POA: Diagnosis not present

## 2019-12-11 DIAGNOSIS — G629 Polyneuropathy, unspecified: Secondary | ICD-10-CM | POA: Insufficient documentation

## 2019-12-11 DIAGNOSIS — Z79899 Other long term (current) drug therapy: Secondary | ICD-10-CM | POA: Insufficient documentation

## 2019-12-11 DIAGNOSIS — K219 Gastro-esophageal reflux disease without esophagitis: Secondary | ICD-10-CM | POA: Diagnosis not present

## 2019-12-11 DIAGNOSIS — R778 Other specified abnormalities of plasma proteins: Secondary | ICD-10-CM

## 2019-12-11 DIAGNOSIS — I4821 Permanent atrial fibrillation: Secondary | ICD-10-CM | POA: Diagnosis not present

## 2019-12-11 DIAGNOSIS — H8109 Meniere's disease, unspecified ear: Secondary | ICD-10-CM | POA: Insufficient documentation

## 2019-12-11 DIAGNOSIS — D472 Monoclonal gammopathy: Secondary | ICD-10-CM | POA: Diagnosis not present

## 2019-12-11 DIAGNOSIS — F419 Anxiety disorder, unspecified: Secondary | ICD-10-CM | POA: Insufficient documentation

## 2019-12-11 DIAGNOSIS — I110 Hypertensive heart disease with heart failure: Secondary | ICD-10-CM | POA: Insufficient documentation

## 2019-12-11 DIAGNOSIS — I7300 Raynaud's syndrome without gangrene: Secondary | ICD-10-CM | POA: Diagnosis not present

## 2019-12-11 DIAGNOSIS — M8580 Other specified disorders of bone density and structure, unspecified site: Secondary | ICD-10-CM | POA: Diagnosis not present

## 2019-12-11 DIAGNOSIS — E611 Iron deficiency: Secondary | ICD-10-CM | POA: Insufficient documentation

## 2019-12-11 DIAGNOSIS — E559 Vitamin D deficiency, unspecified: Secondary | ICD-10-CM | POA: Insufficient documentation

## 2019-12-11 DIAGNOSIS — I5032 Chronic diastolic (congestive) heart failure: Secondary | ICD-10-CM | POA: Diagnosis not present

## 2019-12-11 DIAGNOSIS — Z7901 Long term (current) use of anticoagulants: Secondary | ICD-10-CM | POA: Diagnosis not present

## 2019-12-11 DIAGNOSIS — Z833 Family history of diabetes mellitus: Secondary | ICD-10-CM | POA: Insufficient documentation

## 2019-12-11 DIAGNOSIS — E785 Hyperlipidemia, unspecified: Secondary | ICD-10-CM | POA: Diagnosis not present

## 2019-12-11 LAB — CBC WITH DIFFERENTIAL/PLATELET
Abs Immature Granulocytes: 0.02 10*3/uL (ref 0.00–0.07)
Basophils Absolute: 0 10*3/uL (ref 0.0–0.1)
Basophils Relative: 1 %
Eosinophils Absolute: 0.2 10*3/uL (ref 0.0–0.5)
Eosinophils Relative: 3 %
HCT: 39.6 % (ref 36.0–46.0)
Hemoglobin: 14.4 g/dL (ref 12.0–15.0)
Immature Granulocytes: 0 %
Lymphocytes Relative: 25 %
Lymphs Abs: 1.7 10*3/uL (ref 0.7–4.0)
MCH: 33.2 pg (ref 26.0–34.0)
MCHC: 36.4 g/dL — ABNORMAL HIGH (ref 30.0–36.0)
MCV: 91.2 fL (ref 80.0–100.0)
Monocytes Absolute: 0.5 10*3/uL (ref 0.1–1.0)
Monocytes Relative: 8 %
Neutro Abs: 4.3 10*3/uL (ref 1.7–7.7)
Neutrophils Relative %: 63 %
Platelets: 171 10*3/uL (ref 150–400)
RBC: 4.34 MIL/uL (ref 3.87–5.11)
RDW: 12.9 % (ref 11.5–15.5)
WBC: 6.9 10*3/uL (ref 4.0–10.5)
nRBC: 0 % (ref 0.0–0.2)

## 2019-12-11 LAB — COMPREHENSIVE METABOLIC PANEL
ALT: 18 U/L (ref 0–44)
AST: 18 U/L (ref 15–41)
Albumin: 4.6 g/dL (ref 3.5–5.0)
Alkaline Phosphatase: 63 U/L (ref 38–126)
Anion gap: 9 (ref 5–15)
BUN: 26 mg/dL — ABNORMAL HIGH (ref 8–23)
CO2: 22 mmol/L (ref 22–32)
Calcium: 9.4 mg/dL (ref 8.9–10.3)
Chloride: 109 mmol/L (ref 98–111)
Creatinine, Ser: 1.17 mg/dL — ABNORMAL HIGH (ref 0.44–1.00)
GFR calc non Af Amer: 43 mL/min — ABNORMAL LOW (ref >60)
Glucose, Bld: 99 mg/dL (ref 70–99)
Potassium: 4.5 mmol/L (ref 3.5–5.1)
Sodium: 140 mmol/L (ref 135–145)
Total Bilirubin: 0.7 mg/dL (ref 0.3–1.2)
Total Protein: 7.5 g/dL (ref 6.5–8.1)

## 2019-12-11 LAB — FERRITIN: Ferritin: 299 ng/mL (ref 11–307)

## 2019-12-12 LAB — KAPPA/LAMBDA LIGHT CHAINS
Kappa free light chain: 24.8 mg/L — ABNORMAL HIGH (ref 3.3–19.4)
Kappa, lambda light chain ratio: 1.78 — ABNORMAL HIGH (ref 0.26–1.65)
Lambda free light chains: 13.9 mg/L (ref 5.7–26.3)

## 2019-12-14 ENCOUNTER — Encounter: Payer: Self-pay | Admitting: Oncology

## 2019-12-14 NOTE — Progress Notes (Signed)
Hematology/Oncology Consult note [facility] Telephone:([phone] Fax:([phone]) [phone]  Patient Care Team: [name], MD as PCP - General (Family Medicine) [name], MD as PCP - Cardiology (Cardiology) Minna Merritts, MD as Consulting Physician (Cardiology)   Name of the patient: Sierra Kaiser  [id number]  1933/11/22    Reason for referral- iron deficiency and MGUS   Referring physician-Dr. Danise Mina  Date of visit: 12/14/19   History of presenting illness- Patient is a 86 year old female with a past medical history significant for atrial fibrillation, diastolic heart failure, CAD, Raynaud's disease who has been referred for MGUS and iron deficiency.  Patient had SPEP done which showed IgG lambda immunoglobulin on immunofixation.  She also had her ferritin levels checked 2 months ago which were low at 8.7 iron studies were otherwise normal.  CBC was normal with an H&H of 14/41.  B12 folate and TSH was normal.  Patient reports occasional exertional shortness of breath.  She is also been having peripheral neuropathy especially over her fingertips and finds it difficult to grasp onto things and button her shirt and this has been ongoing for the last few years.  And gradually getting worse. She was positive for ANA  ECOG PS- 1  Pain scale- 0   Review of systems- Review of Systems  Constitutional: Positive for malaise/fatigue. Negative for chills, fever and weight loss.  HENT: Negative for congestion, ear discharge and nosebleeds.   Eyes: Negative for blurred vision.  Respiratory: Positive for shortness of breath. Negative for cough, hemoptysis, sputum production and wheezing.   Cardiovascular: Negative for chest pain, palpitations, orthopnea and claudication.  Gastrointestinal: Negative for abdominal pain, blood in stool, constipation, diarrhea, heartburn, melena, nausea and vomiting.  Genitourinary: Negative for dysuria, flank pain,  frequency, hematuria and urgency.  Musculoskeletal: Negative for back pain, joint pain and myalgias.  Skin: Negative for rash.  Neurological: Positive for sensory change (peripheral neuropathy). Negative for dizziness, tingling, focal weakness, seizures, weakness and headaches.  Endo/Heme/Allergies: Does not bruise/bleed easily.  Psychiatric/Behavioral: Negative for depression and suicidal ideas. The patient does not have insomnia.     Allergies  Allergen Reactions  . Gadolinium Derivatives Itching and Rash    Persisted for months  . Contrast Media [Iodinated Diagnostic Agents] Hives       . Gabapentin Other (See Comments)    Affected mood bad  . Metronidazole Itching and Other (See Comments)    Hands really red  . Other   . Sertraline Other (See Comments)    tremors  . Diltiazem Hcl Itching and Rash    Patient Active Problem List   Diagnosis Date Noted  . S/P placement of cardiac pacemaker 12/04/2019  . Raynaud's disease 12/02/2019  . IgG lambda monoclonal gammopathy 11/08/2019  . Positive ANA (antinuclear antibody) 11/08/2019  . Subclinical hypothyroidism 10/30/2019  . Vitamin D deficiency 10/30/2019  . Orthostatic dizziness 09/24/2019  . Pruritus 10/28/2018  . Medicare annual wellness visit, subsequent 10/18/2018  . Advanced care planning/counseling discussion 10/18/2018  . Anxiety 01/28/2018  . Low serum vitamin B12 11/27/2017  . Right knee pain 07/17/2017  . Chronic fatigue 06/13/2017  . Health maintenance examination 01/30/2017  . Chronic constipation 01/30/2017  . Family circumstance 04/03/2016  . Osteopenia 12/11/2015  . GERD (gastroesophageal reflux disease) 10/18/2015  . Iron deficiency 05/26/2015  . Meniere's disease   . Hyperlipidemia   . CKD (chronic kidney disease) stage 3, GFR 30-59 ml/min (HCC)   . Mitral regurgitation   . Complete  heart block ([place]) 05/06/2014  . Atrial fibrillation, permanent ([place]) 04/22/2014  . 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 S/P ablation of atrial fibrillation 07/25/2013  . Chronic anticoagulation 07/25/2013  . Chronic diastolic CHF (congestive heart failure) ([place]) 07/25/2013  . Chronic dyspnea 07/25/2013     Past Medical History:  Diagnosis Date  . Anxiety   . CAD (coronary artery disease)   . CHF (congestive heart failure) ([place])   . Esophageal reflux   . GI bleeding 2012   during EGD necessitating open surgery  . Hiatal hernia   . History of blood transfusion   . History of diverticulitis   . History of UTI   . HTN (hypertension)   . Hyperlipidemia   . Meniere's disease 1970s  . Mitral regurgitation   . Osteopenia 12/11/2015   T score -1.2 femur, -2.0 spine (12/2015)  . Permanent atrial fibrillation ([place])    a. permanent b. s/p PVI RFA at [facility] 10/14 c. failed amio (neuro toxicity) and Tikosyn d. 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 PUD (peptic ulcer disease)   . Raynaud's disease   . SNHL (sensorineural hearing loss)    right ear  . Syncopal episodes   . Tinnitus   . Tinnitus   . Ulcerative colitis ([place])    per prior records  . Urinary incontinence, urge      Past Surgical History:  Procedure Laterality Date  . APPENDECTOMY  1973  . AV NODE ABLATION N/A 05/06/2014   Procedure: AV NODE ABLATION;  Surgeon: [name], MD;  Location[facility] C[facility] LAB;  Service: Cardiovascular;  Laterality: N/A;  . CARDIAC CATHETERIZATION  2014   [facility]  . CARDIAC ELECTROPHYSIOLOGY STUDY AND ABLATION    . CHOLECYSTECTOMY  2010  . COLONOSCOPY  2010   polyps  . COLONOSCOPY WITH PROPOFOL N/A 08/29/2017   TAs, no f/u recommended [facility], Benay Pike, MD)  . ESOPHAGOGASTRODUODENOSCOPY (EGD) WITH PROPOFOL N/A 08/29/2017   benign biopsies [facility], Benay Pike, MD)  . HEMORRHOID SURGERY    . LAPAROTOMY  2012   EGD biopsy led to bleeding - needed laparotomy to stop bleed (Dr Jimmye Norman at [facility])  . PERMANENT PACEMAKER INSERTION N/A 04/22/2014   STJ  single chamber pacemaker implanted by Dr [name]  . TRANSESOPHAGEAL ECHOCARDIOGRAM WITH CARDIOVERSION  2014   [facility]  . UPPER GI ENDOSCOPY  2012   with polypectomy  . VAGINAL HYSTERECTOMY  1973   elective; ovaries remained    Social History   Socioeconomic History  . Marital status: Married    Spouse name: Not on file  . Number of children: Not on file  . Years of education: Not on file  . Highest education level: Not on file  Occupational History  . Not on file  Tobacco Use  . Smoking status: Never Smoker  . Smokeless tobacco: Never Used  Vaping Use  . Vaping Use: Never used  Substance and Sexual Activity  . Alcohol use: Yes    Alcohol/week: 1.0 - 2.0 standard drink    Types: 1 - 2 Glasses of wine per week    Comment: infrequent mixed drinks  . Drug use: Never  . Sexual activity: Never  Other Topics Concern  . Not on file  Social History Narrative   Lives with husband.    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 Difficulty of Paying Living Expenses: Not hard at all  Food Insecurity: No Food Insecurity  . Worried About Charity fundraiser in the Last Year: Never true  . Ran Out of Food in the Last Year: Never true  Transportation Needs: No Transportation Needs  . Lack of Transportation (Medical): No  . Lack of Transportation (Non-Medical): No  Physical Activity: Inactive  . Days of Exercise per Week: 0 days  . Minutes of Exercise per Session: 0 min  Stress: Stress Concern Present  . Feeling of Stress : To some extent  Social Connections:   . Frequency of Communication with Friends and Family: Not on file  . Frequency of Social Gatherings with Friends and Family: Not on file  . Attends Religious Services: Not on file  . Active Member of Clubs or Organizations: Not on file  . Attends Archivist Meetings: Not on file  .  Marital Status: Not on file  Intimate Partner Violence: Not At Risk  . Fear of Current or Ex-Partner: No  . Emotionally Abused: No  . Physically Abused: No  . Sexually Abused: No     Family History  Problem Relation Age of Onset  . Arrhythmia Mother   . Hypertension Mother   . Diabetes Mother   . Heart failure Brother   . Heart failure Brother   . Cancer Brother        colon (with colostomy)  . Breast cancer Sister 12  . Stroke Sister   . Alcohol abuse Brother   . Stroke Brother   . Diabetes Son      Current Outpatient Medications:  .  Cholecalciferol (VITAMIN D3) 25 MCG (1000 UT) CAPS, Take 1 capsule (1,000 Units total) by mouth daily., Disp: 30 capsule, Rfl:  .  cyanocobalamin (,VITAMIN B-12,) 1000 MCG/ML injection, Inject 1 mL (1,000 mcg total) into the muscle every 30 (thirty) days., Disp: , Rfl:  .  ezetimibe (ZETIA) 10 MG tablet, Take 1 tablet (10 mg total) by mouth daily., Disp: 90 tablet, Rfl: 3 .  fluticasone (FLONASE) 50 MCG/ACT nasal spray, SPRAY 2 SPRAYS INTO EACH NOSTRIL EVERY DAY (Patient taking differently: Place 2 sprays into both nostrils daily. Takes as needed), Disp: 16 g, Rfl: 3 .  furosemide (LASIX) 20 MG tablet, Take 1 tablet (20 mg total) by mouth as needed for fluid or edema., Disp: 30 tablet, Rfl: 3 .  meclizine (ANTIVERT) 25 MG tablet, Take 25 mg by mouth 2 (two) times daily. , Disp: , Rfl:  .  Polyethyl Glycol-Propyl Glycol (SYSTANE OP), Place 1 drop into both eyes daily as needed (dry eyes)., Disp: , Rfl:  .  Rivaroxaban (XARELTO) 15 MG TABS tablet, TAKE 1 TABLET DAILY WITH SUPPER, Disp: 90 tablet, Rfl: 1 .  rosuvastatin (CRESTOR) 20 MG tablet, Take 1 tablet (20 mg total) by mouth every other day., Disp: 45 tablet, Rfl: 3 .  hydrOXYzine (ATARAX/VISTARIL) 10 MG tablet, Take 1 tablet (10 mg total) by mouth 2 (two) times daily as needed for anxiety (sedation precautions). (Patient not taking: Reported on 12/02/2019), Disp: 30 tablet, Rfl: 0 .  triamcinolone  (KENALOG) 0.025 % ointment, Apply 1 application topically 2 (two) times daily. , Disp: , Rfl:   Current Facility-Administered Medications:  .  cyanocobalamin ((VITAMIN B-12)) injection 1,000 mcg, 1,000 mcg, Intramuscular, Once, [name], MD   Physical exam:  Vitals:   12/11/19 1343  BP: 135/78  Pulse: 71  Resp: 16  Temp: (!) 96.8 F (36 C)  TempSrc: Tympanic  SpO2: 100%  Weight: 140 lb 14.4 oz (63.9 kg)   Physical Exam Cardiovascular:     Rate and Rhythm: Normal rate and regular rhythm.     Heart sounds: Normal heart sounds.  Pulmonary:     Effort: Pulmonary effort is normal.     Breath sounds: Normal breath sounds.  Abdominal:     General: Bowel sounds are normal.     Palpations: Abdomen is soft.  Skin:    General: Skin is warm and dry.  Neurological:     Mental Status: She is alert and oriented to person, place, and time.     Comments: Sensory impairment over b/l fingertips        CMP Latest Ref Rng & Units 12/11/2019  Glucose 70 - 99 mg/dL 99  BUN 8 - 23 mg/dL 26(H)  Creatinine 0.44 - 1.00 mg/dL 1.17(H)  Sodium 135 - 145 mmol/L 140  Potassium 3.5 - 5.1 mmol/L 4.5  Chloride 98 - 111 mmol/L 109  CO2 22 - 32 mmol/L 22  Calcium 8.9 - 10.3 mg/dL 9.4  Total Protein 6.5 - 8.1 g/dL 7.5  Total Bilirubin 0.3 - 1.2 mg/dL 0.7  Alkaline Phos 38 - 126 U/L 63  AST 15 - 41 U/L 18  ALT 0 - 44 U/L 18   CBC Latest Ref Rng & Units 12/11/2019  WBC 4.0 - 10.5 K/uL 6.9  Hemoglobin 12.0 - 15.0 g/dL 14.4  Hematocrit 36 - 46 % 39.6  Platelets 150 - 400 K/uL 171    No images are attached to the encounter.  US Renal  Result Date: 11/20/2019 CLINICAL DATA:  Chronic kidney disease. EXAM: RENAL / URINARY TRACT ULTRASOUND COMPLETE COMPARISON:  CT abdomen pelvis dated September 29, 2015. FINDINGS: Right Kidney: Renal measurements: 9.3 x 3.9 x 4.8 cm = volume: 91 mL. Echogenicity within normal limits. No mass or hydronephrosis visualized. Left Kidney: Renal measurements: 8.5 x 4.3  x 3.9 cm = volume: 74 mL. Echogenicity within normal limits. No mass or hydronephrosis visualized. Bladder: Decompressed. Other: 1.2 cm simple cyst in the right hepatic lobe. IMPRESSION: 1. Normal renal ultrasound. Electronically Signed   By: Titus Dubin M.D.   On: 11/20/2019 15:54    Assessment and plan- Patient is a 86 y.o. female referred for iron deficiency and IgG lambda MGUS  1. IgG lambda MGUS: Patient found to have a small IgG lambda paraprotein detected on immunofixation.  She does not have any evidence of anemia or hypercalcemia on her CMP.  Also her GFR is mildly low at 40.  Today I will do a complete myeloma work-up including a myeloma panel and serum free light chains CBC with differential and CMP.  Even if she has a low level of MGUS I do not suspect that this is causing her neuropathy and a neurology work-up may be warranted.  2.  Patient was found to have a low ferritin 2 months ago we will repeat that today.  She is not anemic.  Again low ferritin should not cause her peripheral neuropathy.  I will see her in 2 weeks for a video visit to discuss the results of her blood work   Thank you for this kind referral and the opportunity to participate in the care of this  Patient   Visit Diagnosis 1. Abnormal SPEP     Dr. Randa Evens, MD, MPH [facility] [phone] 12/14/2019  10:38 AM

## 2019-12-15 ENCOUNTER — Telehealth: Payer: Self-pay

## 2019-12-15 LAB — MULTIPLE MYELOMA PANEL, SERUM
Albumin SerPl Elph-Mcnc: 3.9 g/dL (ref 2.9–4.4)
Albumin/Glob SerPl: 1.6 (ref 0.7–1.7)
Alpha 1: 0.3 g/dL (ref 0.0–0.4)
Alpha2 Glob SerPl Elph-Mcnc: 0.7 g/dL (ref 0.4–1.0)
B-Globulin SerPl Elph-Mcnc: 0.9 g/dL (ref 0.7–1.3)
Gamma Glob SerPl Elph-Mcnc: 0.7 g/dL (ref 0.4–1.8)
Globulin, Total: 2.6 g/dL (ref 2.2–3.9)
IgA: 88 mg/dL (ref 64–422)
IgG (Immunoglobin G), Serum: 680 mg/dL (ref 586–1602)
IgM (Immunoglobulin M), Srm: 67 mg/dL (ref 26–217)
Total Protein ELP: 6.5 g/dL (ref 6.0–8.5)

## 2019-12-15 MED ORDER — ROSUVASTATIN CALCIUM 20 MG PO TABS
20.0000 mg | ORAL_TABLET | ORAL | 3 refills | Status: DC
Start: 2019-12-15 — End: 2020-04-08

## 2019-12-15 NOTE — Telephone Encounter (Signed)
Call from pt stating change in ins co and she no longer uses [facility] mail order pharmacy.  Pt now uses [facility].  She is requesting refill [facility]atin [facility].  Pt's chart updated.  [facility].  Pt aware.

## 2019-12-25 ENCOUNTER — Other Ambulatory Visit: Payer: Self-pay

## 2019-12-25 ENCOUNTER — Encounter: Payer: Self-pay | Admitting: Oncology

## 2019-12-25 ENCOUNTER — Inpatient Hospital Stay (HOSPITAL_BASED_OUTPATIENT_CLINIC_OR_DEPARTMENT_OTHER): Payer: Medicare Other | Admitting: Oncology

## 2019-12-25 VITALS — BP 118/54 | HR 70 | Temp 97.3°F | Resp 16 | Wt 141.4 lb

## 2019-12-25 DIAGNOSIS — R778 Other specified abnormalities of plasma proteins: Secondary | ICD-10-CM

## 2019-12-25 DIAGNOSIS — R768 Other specified abnormal immunological findings in serum: Secondary | ICD-10-CM

## 2019-12-25 DIAGNOSIS — G629 Polyneuropathy, unspecified: Secondary | ICD-10-CM | POA: Diagnosis not present

## 2019-12-25 DIAGNOSIS — D472 Monoclonal gammopathy: Secondary | ICD-10-CM | POA: Diagnosis not present

## 2019-12-30 NOTE — Progress Notes (Signed)
Hematology/Oncology Consult note [facility]  Telephone:([phone] [phone]  Patient Care Team: [name], MD as PCP - General (Family Medicine) [name], MD as PCP - Cardiology (Cardiology) Minna Merritts, MD as Consulting Physician (Cardiology)   Name of the patient: Sierra Kaiser  [id number]  26-Mar-1933   Date of visit: 12/30/19  Diagnosis-MGUS  Chief complaint/ Reason for visit-discuss results of blood work  Heme/Onc history: Patient is a 86 year old female with a past medical history significant for atrial fibrillation, diastolic heart failure, CAD, Raynaud's disease who has been referred for MGUS and iron deficiency.  Patient had SPEP done which showed IgG lambda immunoglobulin on immunofixation.  She also had her ferritin levels checked 2 months ago which were low at 8.7 iron studies were otherwise normal.  CBC was normal with an H&H of 14/41.  B12 folate and TSH was normal.  Patient reports occasional exertional shortness of breath.  She is also been having peripheral neuropathy especially over her fingertips and finds it difficult to grasp onto things and button her shirt and this has been ongoing for the last few years.  And gradually getting worse. She was positive for ANA  Results of blood work from 12/11/2019 were as follows: CBC showed white count of 6.9, H&H of 14.4/39.6 with a platelet count of 171.  Serum creatinine was mildly elevated at 1.17.  Myeloma panel showed no M protein on SPEP and immunofixation.  Serum free kappa light chain was mildly elevated at 24 with a light chain ratio 1.78.  Ferritin levels were normal at 299.  Interval history-patient's main concerns are ongoing neuropathy which has affected her right hand for a while but now seems to be involving her left hand as well.  Patient also reports exertional shortness of breath  ECOG PS- 1 Pain scale- 0  Review of systems- Review of Systems    Constitutional: Negative for chills, fever, malaise/fatigue and weight loss.  HENT: Negative for congestion, ear discharge and nosebleeds.   Eyes: Negative for blurred vision.  Respiratory: Positive for shortness of breath. Negative for cough, hemoptysis, sputum production and wheezing.   Cardiovascular: Negative for chest pain, palpitations, orthopnea and claudication.  Gastrointestinal: Negative for abdominal pain, blood in stool, constipation, diarrhea, heartburn, melena, nausea and vomiting.  Genitourinary: Negative for dysuria, flank pain, frequency, hematuria and urgency.  Musculoskeletal: Negative for back pain, joint pain and myalgias.  Skin: Negative for rash.  Neurological: Positive for sensory change (Peripheral neuropathy). Negative for dizziness, tingling, focal weakness, seizures, weakness and headaches.  Endo/Heme/Allergies: Does not bruise/bleed easily.  Psychiatric/Behavioral: Negative for depression and suicidal ideas. The patient does not have insomnia.       Allergies  Allergen Reactions  . Gadolinium Derivatives Itching and Rash    Persisted for months  . Contrast Media [Iodinated Diagnostic Agents] Hives       . Gabapentin Other (See Comments)    Affected mood bad  . Metronidazole Itching and Other (See Comments)    Hands really red  . Other   . Sertraline Other (See Comments)    tremors  . Diltiazem Hcl Itching and Rash     Past Medical History:  Diagnosis Date  . Anxiety   . CAD (coronary artery disease)   . CHF (congestive heart failure) ([place])   . Esophageal reflux   . GI bleeding 2012   during EGD necessitating open surgery  . Hiatal hernia   . History of blood transfusion   .  History of diverticulitis   . History of UTI   . HTN (hypertension)   . Hyperlipidemia   . Meniere's disease 1970s  . Mitral regurgitation   . Osteopenia 12/11/2015   T score -1.2 femur, -2.0 spine (12/2015)  . Permanent atrial fibrillation ([place])    a. permanent b.  s/p PVI RFA at [facility] 10/14 c. failed amio (neuro toxicity) and Tikosyn d. 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 PUD (peptic ulcer disease)   . Raynaud's disease   . SNHL (sensorineural hearing loss)    right ear  . Syncopal episodes   . Tinnitus   . Tinnitus   . Ulcerative colitis ([place])    per prior records  . Urinary incontinence, urge      Past Surgical History:  Procedure Laterality Date  . APPENDECTOMY  1973  . AV NODE ABLATION N/A 05/06/2014   Procedure: AV NODE ABLATION;  Surgeon: [name], MD;  Location: [facility] CATH LAB;  Service: Cardiovascular;  Laterality: N/A;  . CARDIAC CATHETERIZATION  2014   [facility]  . CARDIAC ELECTROPHYSIOLOGY STUDY AND ABLATION    . CHOLECYSTECTOMY  2010  . COLONOSCOPY  2010   polyps  . COLONOSCOPY WITH PROPOFOL N/A 08/29/2017   TAs, no f/u recommended [facility], Benay Pike, MD)  . ESOPHAGOGASTRODUODENOSCOPY (EGD) WITH PROPOFOL N/A 08/29/2017   benign biopsies [facility], Benay Pike, MD)  . HEMORRHOID SURGERY    . LAPAROTOMY  2012   EGD biopsy led to bleeding - needed laparotomy to stop bleed ([facility] Jimmye Norman [facility])  . PERMANENT PACEMAKER INSERTION N/A 04/22/2014   STJ single chamber pacemaker implanted by Dr [name]  . TRANSESOPHAGEAL ECHOCARDIOGRAM WITH CARDIOVERSION  2014   [facility]  . UPPER GI ENDOSCOPY  2012   with polypectomy  . VAGINAL HYSTERECTOMY  1973   elective; ovaries remained    Social History   Socioeconomic History  . Marital status: Married    Spouse name: Not on file  . Number of children: Not on file  . Years of education: Not on file  . Highest education level: Not on file  Occupational History  . Not on file  Tobacco Use  . Smoking status: Never Smoker  . Smokeless tobacco: Never Used  Vaping Use  . Vaping Use: Never used  Substance and Sexual Activity  . Alcohol use: Yes    Alcohol/week: 1.0 - 2.0 standard drink    Types: 1 - 2 Glasses of wine per week    Comment:  infrequent mixed drinks  . Drug use: Never  . Sexual activity: Never  Other Topics Concern  . Not on file  Social History Narrative   Lives with husband.    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 Difficulty of Paying Living Expenses: Not hard at all  Food Insecurity: No Food Insecurity  . Worried About Charity fundraiser in the Last Year: Never true  . Ran Out of Food in the Last Year: Never true  Transportation Needs: No Transportation Needs  . Lack of Transportation (Medical): No  . Lack of Transportation (Non-Medical): No  Physical Activity: Inactive  . Days of Exercise per Week: 0 days  . Minutes of Exercise per Session: 0 min  Stress: Stress Concern Present  . Feeling of Stress :  To some extent  Social Connections:   . Frequency of Communication with Friends and Family: Not on file  . Frequency of Social Gatherings with Friends and Family: Not on file  . Attends Religious Services: Not on file  . Active Member of Clubs or Organizations: Not on file  . Attends Archivist Meetings: Not on file  . Marital Status: Not on file  Intimate Partner Violence: Not At Risk  . Fear of Current or Ex-Partner: No  . Emotionally Abused: No  . Physically Abused: No  . Sexually Abused: No    Family History  Problem Relation Age of Onset  . Arrhythmia Mother   . Hypertension Mother   . Diabetes Mother   . Heart failure Brother   . Heart failure Brother   . Cancer Brother        colon (with colostomy)  . Breast cancer Sister 37  . Stroke Sister   . Alcohol abuse Brother   . Stroke Brother   . Diabetes Son      Current Outpatient Medications:  .  Cholecalciferol (VITAMIN D3) 25 MCG (1000 UT) CAPS, Take 1 capsule (1,000 Units total) by mouth daily., Disp: 30 capsule, Rfl:  .  cyanocobalamin (,VITAMIN B-12,) 1000 MCG/ML injection,  Inject 1 mL (1,000 mcg total) into the muscle every 30 (thirty) days., Disp: , Rfl:  .  ezetimibe (ZETIA) 10 MG tablet, Take 1 tablet (10 mg total) by mouth daily., Disp: 90 tablet, Rfl: 3 .  fluticasone (FLONASE) 50 MCG/ACT nasal spray, SPRAY 2 SPRAYS INTO EACH NOSTRIL EVERY DAY (Patient taking differently: Place 2 sprays into both nostrils daily. Takes as needed), Disp: 16 g, Rfl: 3 .  furosemide (LASIX) 20 MG tablet, Take 1 tablet (20 mg total) by mouth as needed for fluid or edema., Disp: 30 tablet, Rfl: 3 .  meclizine (ANTIVERT) 25 MG tablet, Take 25 mg by mouth 2 (two) times daily. , Disp: , Rfl:  .  Polyethyl Glycol-Propyl Glycol (SYSTANE OP), Place 1 drop into both eyes daily as needed (dry eyes)., Disp: , Rfl:  .  Rivaroxaban (XARELTO) 15 MG TABS tablet, TAKE 1 TABLET DAILY WITH SUPPER, Disp: 90 tablet, Rfl: 1 .  rosuvastatin (CRESTOR) 20 MG tablet, Take 1 tablet (20 mg total) by mouth every other day., Disp: 45 tablet, Rfl: 3 .  triamcinolone (KENALOG) 0.025 % ointment, Apply 1 application topically 2 (two) times daily. , Disp: , Rfl:  .  hydrOXYzine (ATARAX/VISTARIL) 10 MG tablet, Take 1 tablet (10 mg total) by mouth 2 (two) times daily as needed for anxiety (sedation precautions). (Patient not taking: Reported on 12/02/2019), Disp: 30 tablet, Rfl: 0  Current Facility-Administered Medications:  .  cyanocobalamin ((VITAMIN B-12)) injection 1,000 mcg, 1,000 mcg, Intramuscular, Once, [name], MD  Physical exam:  Vitals:   12/25/19 1448  BP: (!) 118/54  Pulse: 70  Resp: 16  Temp: (!) 97.3 F (36.3 C)  TempSrc: Tympanic  SpO2: 100%  Weight: 141 lb 6.4 oz (64.1 kg)   Physical Exam Constitutional:      General: She is not in acute distress. Cardiovascular:     Rate and Rhythm: Normal rate and regular rhythm.     Heart sounds: Normal heart sounds.  Pulmonary:     Effort: Pulmonary effort is normal.     Breath sounds: Normal breath sounds.  Abdominal:     General: Bowel  sounds are normal.     Palpations: Abdomen is soft.  Skin:  General: Skin is warm and dry.  Neurological:     Mental Status: She is alert and oriented to person, place, and time.      CMP Latest Ref Rng & Units 12/11/2019  Glucose 70 - 99 mg/dL 99  BUN 8 - 23 mg/dL 26(H)  Creatinine 0.44 - 1.00 mg/dL 1.17(H)  Sodium 135 - 145 mmol/L 140  Potassium 3.5 - 5.1 mmol/L 4.5  Chloride 98 - 111 mmol/L 109  CO2 22 - 32 mmol/L 22  Calcium 8.9 - 10.3 mg/dL 9.4  Total Protein 6.5 - 8.1 g/dL 7.5  Total Bilirubin 0.3 - 1.2 mg/dL 0.7  Alkaline Phos 38 - 126 U/L 63  AST 15 - 41 U/L 18  ALT 0 - 44 U/L 18   CBC Latest Ref Rng & Units 12/11/2019  WBC 4.0 - 10.5 K/uL 6.9  Hemoglobin 12.0 - 15.0 g/dL 14.4  Hematocrit 36 - 46 % 39.6  Platelets 150 - 400 K/uL 171     Assessment and plan- Patient is a 86 y.o. female referred for IgG lambda MGUS  1.  IgG lambda MGUS: On repeat myeloma panel testing there was no IgG lambda paraprotein detected both on SPEP and immunofixation.  I will follow up on these findings once more in 6 months.  In fact her serum kappa was mildly elevated at 24 with a ratio of 1.78 but this is not concerning for myeloma.  She may have a small amount of light chain MGUS which can also be followed up on in 6 months.  However these findings would not explain her neuropathy.  I would like to refer the patient to neurology for her symptoms of ongoing neuropathy.  2.  With regards to symptoms of exertional shortness of breath, I have asked the patient to follow-up with Dr. Danise Mina for further work-up   Visit Diagnosis 1. Neuropathy   2. Abnormal SPEP      Dr. Randa Evens, MD, MPH [facility] at [facility] [phone] 12/30/2019 8:36 AM

## 2019-12-31 ENCOUNTER — Telehealth: Payer: Self-pay

## 2019-12-31 NOTE — Telephone Encounter (Signed)
Faxed referral to [facility] neuro for neuropathy.

## 2020-01-07 ENCOUNTER — Other Ambulatory Visit: Payer: Self-pay

## 2020-01-07 ENCOUNTER — Ambulatory Visit (INDEPENDENT_AMBULATORY_CARE_PROVIDER_SITE_OTHER): Payer: Medicare Other

## 2020-01-07 DIAGNOSIS — E538 Deficiency of other specified B group vitamins: Secondary | ICD-10-CM | POA: Diagnosis not present

## 2020-01-07 MED ORDER — CYANOCOBALAMIN 1000 MCG/ML IJ SOLN
1000.0000 ug | Freq: Once | INTRAMUSCULAR | Status: AC
  Administered 2020-01-07: 1000 ug via INTRAMUSCULAR

## 2020-01-07 NOTE — Progress Notes (Signed)
Per orders of Dr. Danise Mina, monthly injection of B12, given by Loreen Freud. Patient tolerated injection well.

## 2020-01-21 NOTE — Telephone Encounter (Signed)
error 

## 2020-01-30 ENCOUNTER — Other Ambulatory Visit: Payer: Self-pay | Admitting: Family Medicine

## 2020-02-02 ENCOUNTER — Telehealth: Payer: Self-pay | Admitting: *Deleted

## 2020-02-02 NOTE — Telephone Encounter (Signed)
Noted.  Routed to PCP to address upon return.  Thanks.

## 2020-02-02 NOTE — Telephone Encounter (Signed)
Dr. Mickey Farber left a voicemail stating that this is not urgent.  Dr. Mickey Farber stated that he just wanted to touch basis with Dr. Danise Mina about the patient. Dr. Mickey Farber stated that he will be in the office the rest of the day, but then will be out for a couple of days. Dr. Mickey Farber stated that you can reach him on his cell phone. [phone], office # [phone]

## 2020-02-03 ENCOUNTER — Other Ambulatory Visit: Payer: Self-pay

## 2020-02-03 ENCOUNTER — Encounter: Payer: Self-pay | Admitting: Family Medicine

## 2020-02-03 ENCOUNTER — Ambulatory Visit (INDEPENDENT_AMBULATORY_CARE_PROVIDER_SITE_OTHER): Payer: Medicare Other | Admitting: Family Medicine

## 2020-02-03 VITALS — BP 120/64 | HR 79 | Temp 97.6°F | Ht 66.25 in | Wt 142.1 lb

## 2020-02-03 DIAGNOSIS — D472 Monoclonal gammopathy: Secondary | ICD-10-CM

## 2020-02-03 DIAGNOSIS — F419 Anxiety disorder, unspecified: Secondary | ICD-10-CM

## 2020-02-03 DIAGNOSIS — E559 Vitamin D deficiency, unspecified: Secondary | ICD-10-CM

## 2020-02-03 DIAGNOSIS — Z7901 Long term (current) use of anticoagulants: Secondary | ICD-10-CM | POA: Diagnosis not present

## 2020-02-03 DIAGNOSIS — H8109 Meniere's disease, unspecified ear: Secondary | ICD-10-CM

## 2020-02-03 DIAGNOSIS — L299 Pruritus, unspecified: Secondary | ICD-10-CM

## 2020-02-03 DIAGNOSIS — E538 Deficiency of other specified B group vitamins: Secondary | ICD-10-CM | POA: Diagnosis not present

## 2020-02-03 DIAGNOSIS — R0609 Other forms of dyspnea: Secondary | ICD-10-CM

## 2020-02-03 DIAGNOSIS — I442 Atrioventricular block, complete: Secondary | ICD-10-CM

## 2020-02-03 MED ORDER — CYANOCOBALAMIN 1000 MCG/ML IJ SOLN
1000.0000 ug | Freq: Once | INTRAMUSCULAR | Status: AC
  Administered 2020-02-03: 1000 ug via INTRAMUSCULAR

## 2020-02-03 NOTE — Assessment & Plan Note (Signed)
Continue monthly b12 shots.  

## 2020-02-03 NOTE — Assessment & Plan Note (Signed)
Today feeling better from this standpoint. I spoke with Dr Charlyne Quale - overall reassuring evaluation, reviewed OTC topical options.  Patient feels oral benadryl 12.5mg  at a time BID PRN has been most effective to date.

## 2020-02-03 NOTE — Telephone Encounter (Signed)
I spoke with Dr Charlyne Quale today.

## 2020-02-03 NOTE — Assessment & Plan Note (Signed)
Pacer in place.

## 2020-02-03 NOTE — Assessment & Plan Note (Signed)
Continues xarelto for h/o afib

## 2020-02-03 NOTE — Assessment & Plan Note (Signed)
H/o this. Consider hctz.

## 2020-02-03 NOTE — Assessment & Plan Note (Signed)
Recently started on lexapro by neurology ?pseudodementia.  Planned f/u with them.

## 2020-02-03 NOTE — Progress Notes (Signed)
Patient ID: Sierra Kaiser, female    DOB: [birth year], 86 y.o.   MRN: [id number]  This visit was conducted in person.  BP 120/64 (BP Location: Left Arm, Patient Position: Sitting, Cuff Size: Normal)   Pulse 79   Temp 97.6 F (36.4 C) (Temporal)   Ht 5' 6.25" (1.683 m)   Wt 142 lb 1 oz (64.4 kg)   SpO2 96%   BMI 22.76 kg/m   BP Readings from Last 3 Encounters:  02/03/20 120/64  12/25/19 (!) 118/54  12/11/19 135/78    CC: 3 mo f/u visit  Subjective:   HPI: Sierra Kaiser is a 86 y.o. female presenting on 02/03/2020 for Follow-up (Here for 3 mo f/u.)   Chronic dyspnea and fatigue, has seen cardiology (Klein/Gollan) and pulm Raul Del) previously (last 2018, 2019). Continues dyspneic. No upcoming appointments - advised schedule.   Since seen here has seen oncology for IgG lambda MGUS (repeat labs returned normal). Planning to rpt 6 mo labs. ?small light chain MGUS as well. Not the explanation for patient's neuropathy. 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 Ordered HST, rec walking with rolling walker 30 min daily. Started on lexapro 75m daily for possible pseudodementia. Pt states "I was diagnosed with Alzheimer's". She doesn't think she has memory trouble but notes her husband thinks she does.   I touched base with Dr MCharlyne Qualetoday on the phone regarding dermatologic care to date - unsure cause of chronic pruritis. rec sarna and capsaicin, as well as topical benadryl and triamcinolone. To consider light box therapy.   She is not itching as much - feels benadryl tablets (1/2 BID PRN) helps this.  Notes dizziness with exertion ie walking. Describes imbalance. Doesn't use cane/walker. Denies falls this past year. She declines PT referral for balance training/fall prevention program.      Relevant past medical, surgical, family and social history reviewed and updated as indicated.  Interim medical history since our last visit reviewed. Allergies and medications reviewed and updated. Outpatient Medications Prior to Visit  Medication Sig Dispense Refill  . cyanocobalamin (,VITAMIN B-12,) 1000 MCG/ML injection Inject 1 mL (1,000 mcg total) into the muscle every 30 (thirty) days.    . ergocalciferol (VITAMIN D2) 1.25 MG (50000 UT) capsule Take by mouth.    . escitalopram (LEXAPRO) 10 MG tablet Take 10 mg by mouth daily.    .[place] Kitchenezetimibe (ZETIA) 10 MG tablet Take 1 tablet (10 mg total) by mouth daily. 90 tablet 3  . fluticasone (FLONASE) 50 MCG/ACT nasal spray SPRAY 2 SPRAYS INTO EACH NOSTRIL EVERY DAY (Patient taking differently: Place 2 sprays into both nostrils daily. Takes as needed) 16 g 3  . furosemide (LASIX) 20 MG tablet TAKE 1 TABLET (20 MG TOTAL) BY MOUTH AS NEEDED FOR FLUID OR EDEMA. 30 tablet 3  . meclizine (ANTIVERT) 25 MG tablet Take 25 mg by mouth 2 (two) times daily.     .Vladimir FasterGlycol-Propyl Glycol (SYSTANE OP) Place 1 drop into both eyes daily as needed (dry eyes).    . Rivaroxaban (XARELTO) 15 MG TABS tablet TAKE 1 TABLET DAILY WITH SUPPER 90 tablet 1  . rosuvastatin (CRESTOR) 20 MG tablet Take 1 tablet (20 mg total) by mouth every other day. 45 tablet 3  . triamcinolone (KENALOG) 0.025 % ointment Apply 1 application topically 2 (two) times daily.     . diphenhydrAMINE (BENADRYL ALLERGY) 25 MG tablet Take 0.5-1 tablets (  12.5-25 mg total) by mouth 2 (two) times daily as needed for itching.    . Cholecalciferol (VITAMIN D3) 25 MCG (1000 UT) CAPS Take 1 capsule (1,000 Units total) by mouth daily. 30 capsule   . hydrOXYzine (ATARAX/VISTARIL) 10 MG tablet Take 1 tablet (10 mg total) by mouth 2 (two) times daily as needed for anxiety (sedation precautions). (Patient not taking: Reported on 12/02/2019) 30 tablet 0   Facility-Administered Medications Prior to Visit  Medication Dose Route Frequency Provider Last Rate Last Admin  . cyanocobalamin ((VITAMIN B-12))  injection 1,000 mcg  1,000 mcg Intramuscular Once [name], MD         Per HPI unless specifically indicated in ROS section below Review of Systems Objective:  BP 120/64 (BP Location: Left Arm, Patient Position: Sitting, Cuff Size: Normal)   Pulse 79   Temp 97.6 F (36.4 C) (Temporal)   Ht 5' 6.25" (1.683 m)   Wt 142 lb 1 oz (64.4 kg)   SpO2 96%   BMI 22.76 kg/m   Wt Readings from Last 3 Encounters:  02/03/20 142 lb 1 oz (64.4 kg)  12/25/19 141 lb 6.4 oz (64.1 kg)  12/11/19 140 lb 14.4 oz (63.9 kg)      Physical Exam Vitals and nursing note reviewed.  Constitutional:      Appearance: Normal appearance. She is not ill-appearing.  Cardiovascular:     Rate and Rhythm: Normal rate. Rhythm irregular.     Pulses: Normal pulses.     Heart sounds: Normal heart sounds. No murmur heard.   Pulmonary:     Effort: Pulmonary effort is normal. No respiratory distress.     Breath sounds: Normal breath sounds. No wheezing, rhonchi or rales.  Musculoskeletal:     Right lower leg: No edema.     Left lower leg: No edema.  Neurological:     Mental Status: She is alert.  Psychiatric:        Mood and Affect: Mood normal.        Behavior: Behavior normal.       Results for orders placed or performed in visit on 12/11/19  Ferritin  Result Value Ref Range   Ferritin 299 11 - 307 ng/mL  Kappa/lambda light chains  Result Value Ref Range   Kappa free light chain 24.8 (H) 3.3 - 19.4 mg/L   Lamda free light chains 13.9 5.7 - 26.3 mg/L   Kappa, lamda light chain ratio 1.78 (H) 0.26 - 1.65  Multiple Myeloma Panel (SPEP&IFE w/QIG)  Result Value Ref Range   IgG (Immunoglobin G), Serum 680 586 - 1,602 mg/dL   IgA 88 64 - 422 mg/dL   IgM (Immunoglobulin M), Srm 67 26 - 217 mg/dL   Total Protein ELP 6.5 6.0 - 8.5 g/dL   Albumin SerPl Elph-Mcnc 3.9 2.9 - 4.4 g/dL   Alpha 1 0.3 0.0 - 0.4 g/dL   Alpha2 Glob SerPl Elph-Mcnc 0.7 0.4 - 1.0 g/dL   B-Globulin SerPl Elph-Mcnc 0.9 0.7 - 1.3  g/dL   Gamma Glob SerPl Elph-Mcnc 0.7 0.4 - 1.8 g/dL   M Protein SerPl Elph-Mcnc Not Observed Not Observed g/dL   Globulin, Total 2.6 2.2 - 3.9 g/dL   Albumin/Glob SerPl 1.6 0.7 - 1.7   IFE 1 Comment    Please Note Comment   Comprehensive metabolic panel  Result Value Ref Range   Sodium 140 135 - 145 mmol/L   Potassium 4.5 3.5 - 5.1 mmol/L   Chloride 109 98 - 111  mmol/L   CO2 22 22 - 32 mmol/L   Glucose, Bld 99 70 - 99 mg/dL   BUN 26 (H) 8 - 23 mg/dL   Creatinine, Ser 1.17 (H) 0.44 - 1.00 mg/dL   Calcium 9.4 8.9 - 10.3 mg/dL   Total Protein 7.5 6.5 - 8.1 g/dL   Albumin 4.6 3.5 - 5.0 g/dL   AST 18 15 - 41 U/L   ALT 18 0 - 44 U/L   Alkaline Phosphatase 63 38 - 126 U/L   Total Bilirubin 0.7 0.3 - 1.2 mg/dL   GFR calc non Af Amer 43 (L) >60 mL/min   Anion gap 9 5 - 15  CBC with Differential/Platelet  Result Value Ref Range   WBC 6.9 4.0 - 10.5 K/uL   RBC 4.34 3.87 - 5.11 MIL/uL   Hemoglobin 14.4 12.0 - 15.0 g/dL   HCT 39.6 36 - 46 %   MCV 91.2 80.0 - 100.0 fL   MCH 33.2 26.0 - 34.0 pg   MCHC 36.4 (H) 30.0 - 36.0 g/dL   RDW 12.9 11.5 - 15.5 %   Platelets 171 150 - 400 K/uL   nRBC 0.0 0.0 - 0.2 %   Neutrophils Relative % 63 %   Neutro Abs 4.3 1.7 - 7.7 K/uL   Lymphocytes Relative 25 %   Lymphs Abs 1.7 0.7 - 4.0 K/uL   Monocytes Relative 8 %   Monocytes Absolute 0.5 0.1 - 1.0 K/uL   Eosinophils Relative 3 %   Eosinophils Absolute 0.2 0.0 - 0.5 K/uL   Basophils Relative 1 %   Basophils Absolute 0.0 0.0 - 0.1 K/uL   Immature Granulocytes 0 %   Abs Immature Granulocytes 0.02 0.00 - 0.07 K/uL   Lab Results  Component Value Date   TSH 5.53 (H) 10/29/2019    Assessment & Plan:  This visit occurred during the SARS-CoV-2 public health emergency.  Safety protocols were in place, including screening questions prior to the visit, additional usage of staff PPE, and extensive cleaning of exam room while observing appropriate contact time as indicated for disinfecting solutions.    Problem List Items Addressed This Visit    Vitamin D deficiency    She will let me know what dose she's taking.       Pruritus    Today feeling better from this standpoint. I spoke with Dr Charlyne Quale - overall reassuring evaluation, reviewed OTC topical options.  Patient feels oral benadryl 12.51m at a time BID PRN has been most effective to date.      Meniere's disease    H/o this. Consider hctz.       Low serum vitamin B12    Continue monthly b12 shots.       IgG lambda monoclonal gammopathy    Appreciate onc care.       [place]    Pacer in place.       Chronic dyspnea - Primary    Advised return to pulm/cards as last seen 2018, 2019.  Likely multifactorial, thought COPD and dCHF and deconditioning contributing. Encouraged daily walking - she doesn't like walking in the cold.       Chronic anticoagulation    Continues xarelto for h/o afib       Anxiety    Recently started on lexapro by neurology ?pseudodementia.  Planned f/u with them.       Relevant Medications   escitalopram (LEXAPRO) 10 MG tablet       Meds  ordered this encounter  Medications  . cyanocobalamin ((VITAMIN B-12)) injection 1,000 mcg   No orders of the defined types were placed in this encounter.   Follow up plan: Return in about 3 months (around 05/03/2020) for follow up visit.  [name], MD

## 2020-02-03 NOTE — Assessment & Plan Note (Addendum)
Advised return to pulm/cards as last seen 2018, 2019.  Likely multifactorial, thought COPD and dCHF and deconditioning contributing. Encouraged daily walking - she doesn't like walking in the cold.

## 2020-02-03 NOTE — Patient Instructions (Addendum)
B12 shot today.  Touch base to schedule appointment with Dr Rockey Situ and Dr Raul Del lung doctor about ongoing shortness of breath and follow up visit.  Change ecitalopram to AM dosing.  Schedule next b12 shot (monthly). Return in 3 months for follow up visit.

## 2020-02-03 NOTE — Assessment & Plan Note (Signed)
She will let me know what dose she's taking.

## 2020-02-03 NOTE — Assessment & Plan Note (Signed)
Appreciate onc care.  

## 2020-02-11 ENCOUNTER — Ambulatory Visit (INDEPENDENT_AMBULATORY_CARE_PROVIDER_SITE_OTHER): Payer: Medicare Other

## 2020-02-11 DIAGNOSIS — I4821 Permanent atrial fibrillation: Secondary | ICD-10-CM

## 2020-02-11 LAB — CUP PACEART REMOTE DEVICE CHECK
Battery Remaining Longevity: 131 mo
Battery Remaining Percentage: 95.5 %
Battery Voltage: 2.96 V
Brady Statistic RV Percent Paced: 99 %
Date Time Interrogation Session: 20211208020018
Implantable Lead Implant Date: 20160217
Implantable Lead Location: 753860
Implantable Lead Model: 1948
Implantable Pulse Generator Implant Date: 20160217
Lead Channel Impedance Value: 550 Ohm
Lead Channel Pacing Threshold Amplitude: 0.5 V
Lead Channel Pacing Threshold Pulse Width: 0.4 ms
Lead Channel Sensing Intrinsic Amplitude: 9.1 mV
Lead Channel Setting Pacing Amplitude: 0.75 V
Lead Channel Setting Pacing Pulse Width: 0.4 ms
Lead Channel Setting Sensing Sensitivity: 2.5 mV
Pulse Gen Model: 2240
Pulse Gen Serial Number: 3050195

## 2020-02-24 NOTE — Progress Notes (Signed)
Remote pacemaker transmission.   

## 2020-03-09 ENCOUNTER — Ambulatory Visit: Payer: Medicare Other

## 2020-03-10 ENCOUNTER — Encounter: Payer: Self-pay | Admitting: Family Medicine

## 2020-03-10 ENCOUNTER — Telehealth: Payer: Self-pay

## 2020-03-10 NOTE — Telephone Encounter (Signed)
[facility] - Client TELEPHONE ADVICE RECORD [facility] Patient Name: Sierra Kaiser Gender: Female DOB: 11/30/1933 Age: 87 Y 10 M 18 D Return Phone Number: [phone] (Primary), [phone] (Secondary) Address: City/State/Zip: [place] Client [facility] - Client Client Site Post Physician [name] - MD Contact Type Call Who Is Calling Patient / Member / Family / Caregiver Call Type Triage / Clinical Caller Name Durwin Nora Relationship To Patient Sibling Return Phone Number [phone] (Secondary) Chief Complaint BREATHING - shortness of breath or sounds breathless Reason for Call Symptomatic / Request for [place] states is Sierra Kaiser with [place]. Pt's sister is experiencing shortness of breath, headache and COVID symptoms. Translation No Nurse Assessment Nurse: Rock Nephew, RN, Juliann Pulse Date/Time (Eastern Time): 03/10/2020 1:50:35 PM Confirm and document reason for call. If symptomatic, describe symptoms. ---Caller was exposed to Sierra Kaiser by her husband. She is experiencing shortness of breath, and headache. Does the patient have any new or worsening symptoms? ---Yes Will a triage be completed? ---Yes Related visit to physician within the last 2 weeks? ---No Does the PT have any chronic conditions? (i.e. diabetes, asthma, this includes High risk factors for pregnancy, etc.) ---Yes List chronic conditions. ---Cardiac pacemaker, CKD stage 3, Xarelto Is this a behavioral health or substance abuse call? ---No Guidelines Guideline Title Affirmed Question Affirmed Notes Nurse Date/Time ([place] Time) COVID-19 - Diagnosed or Suspected MILD difficulty breathing (e.g., minimal/no SOB at rest, SOB with walking, pulse <100) Rock Nephew, RN, Juliann Pulse 03/10/2020 1:52:13 PM Disp. Time Eilene Ghazi Time) Disposition Final User 03/10/2020 1:46:52 PM Send to  Urgent Tawanna Cooler 03/10/2020 2:00:07 PM See HCP within 4 Hours (or PCP triage) Yes Rock Nephew, RN, Juliann Pulse PLEASE NOTE: All timestamps contained within this report are represented as [place] Standard Time. CONFIDENTIALTY NOTICE: This fax transmission is intended only for the addressee. It contains information that is legally privileged, confidential or otherwise protected from use or disclosure. If you are not the intended recipient, you are strictly prohibited from reviewing, disclosing, copying using or disseminating any of this information or taking any action in reliance on or regarding this information. If you have received this fax in error, please notify us immediately by telephone so that we can arrange for its return to [place]. Phone: [phone], Toll-Free: [phone], Fax: [phone] Page: 2 of 2 Call Id: [id number] [place] Disagree/Comply Comply Caller Understands Yes PreDisposition Call Doctor Care Advice Given Per Guideline SEE HCP (OR PCP TRIAGE) WITHIN 4 HOURS: * IF OFFICE WILL BE OPEN: You need to be seen within the next 3 or 4 hours. Call your doctor (or NP/PA) now or as soon as the office opens. * Feeling dehydrated: Drink extra liquids. If the air in your home is dry, use a humidifier. * Telemedicine may be your best choice for care during this COVID-19 outbreak. GENERAL CARE ADVICE FOR COVID-19 SYMPTOMS: CALL BACK IF: * You become worse CARE ADVICE given per COVID-19 - DIAGNOSED OR SUSPECTED (Adult) guideline. Comments User: Valetta Mole, RN Date/Time Eilene Ghazi Time): 03/10/2020 1:49:55 PM Primary number is Caller and secondary number is patient Sierra Kaiser ) . User: Valetta Mole, RN Date/Time Eilene Ghazi Time): 03/10/2020 2:04:06 PM Unable to reach back line per directives, calling main number to assist patient with an appt and I am " Caller #[phone]" . User: Valetta Mole, RN Date/Time Eilene Ghazi Time): 03/10/2020 2:17:06 PM No appts available at the clinic even for telemedicine visits until  Monday . patient stated she will go to [facility]. Referrals REFERRED TO PCP OFFICE

## 2020-03-10 NOTE — Telephone Encounter (Signed)
Spoke with pt's sister [name] and I told her that Dr. Reece Agar schedule was full and the only thing that we could do for her is schedule a 4:30 virtual visit bc with the symptoms that she is having there is no way that she could come into the office. She stated that she wanted a rapid test done and I told her that we do not do rapid here. She stated that she would take her to [facility] since the take walk-ins and do rapid testing. I said ok and keep [place] update on her progression and to let us know if there is anything else that we can assist with.  Christy Gentles

## 2020-03-23 ENCOUNTER — Ambulatory Visit: Payer: Medicare Other | Admitting: Cardiovascular Disease

## 2020-04-07 ENCOUNTER — Other Ambulatory Visit: Payer: Self-pay | Admitting: Family Medicine

## 2020-04-26 NOTE — Progress Notes (Signed)
Date:  04/27/2020   ID:  Sierra Kaiser, [place] 11-14-1933, MRN [id number]  Patient Location:  [place] [id number]   Provider location:   [facility], [place] office  PCP:  [name], MD  Cardiologist:  [name] [facility]   Chief Complaint  Patient presents with   Follow-up    Annual follow up. Medications verbally reviewed with patient.     History of Present Illness:    Sierra Kaiser is a 87 y.o. female  past medical history of atrial fibrillation, previously followed at [facility],  long history of atrial fibrillation, trial on numerous medications.  Tikosyn Started 2012.Multaq.   atrial fibrillation ablation in October 2014. syncope in 2013, March 2015. Possibly from low blood pressure Chronic renal insufficiency AV node ablation, now with pacemaker February 2016 Aortic atherosclerosis, moderate on CT scan 09/2015 Anxiety/stress Mild to moderate mitral valve regurgitation Chronic SOB, "had it for 15 years" She presents for routine follow-up of her atrial fibrillation, paced rhythm  In follow-up today presents with her son who has moved from [place] to live in the area She reports some problems with her memory, he is helping  Continued hearing loss, Mnire's disease, chronic dizziness Some medication confusion, uses a pillbox  No exercise, legs are getting weak Deconditioned, sedentary Denies any falls Continues to make birdfeeders  EKG personally reviewed by myself on todays visit Shows underlying atrial fibrillation, paced rhythm   Lab Results  Component Value Date   CHOL 128 10/15/2019   HDL 56.60 10/15/2019   LDLCALC 51 10/15/2019   TRIG 99.0 10/15/2019    Other past medical history reviewed CT scan 09/29/2015 moderate diffuse aortic and iliac artery  Atherosclerosis Unable to exclude distal RCA coronary calcification   on anticoagulation, xarelto 15 mg daily  Echocardiogram March  2016 showing moderate MR, mildly elevated right heart pressures estimated 40 mmHg  Previously on metoprolol and tikosyn. Changed to amiodarone in preparation for cardioversion She underwent cardioversion 11/12/2013 which was successful but she reports going back to atrial fibrillation the next day   Lab work from [facility] 09/28/2012 showing creatinine 1.55, BUN 24 BNP 4800 EKG 09/28/2012 showing atrial fibrillation with RVR, rate 137 beats per minute followup EKG showing normal sinus rhythm with rate 82 beats per minute   Echocardiogram January 2011 was essentially normal. Repeat echocardiogram September 2014 again with normal LV function, normal study  Cardioversion may 2012 Cardioversion 12/06/2012 cardiac cath 05/17/2007 showing no significant CAD   Prior CV studies:   The following studies were reviewed today:    Past Medical History:  Diagnosis Date   Anxiety    CAD (coronary artery disease)    CHF (congestive heart failure) ([place])    Esophageal reflux    GI bleeding 2012   during EGD necessitating open surgery   Hiatal hernia    History of blood transfusion    History of diverticulitis    History of UTI    HTN (hypertension)    Hyperlipidemia    Meniere's disease 1970s   Mitral regurgitation    Osteopenia 12/11/2015   T score -1.2 femur, -2.0 spine (12/2015)   Permanent atrial fibrillation ([place])    a. permanent b. s/p PVI RFA at [facility] 10/14 c. failed amio (neuro toxicity) and Tikosyn d. 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    PUD (peptic ulcer disease)    Raynaud's disease  SNHL (sensorineural hearing loss)    right ear   Syncopal episodes    Tinnitus    Tinnitus    Ulcerative colitis ([place])    per prior records   Urinary incontinence, urge    Past Surgical History:  Procedure Laterality Date   APPENDECTOMY  1973   AV NODE ABLATION N/A 05/06/2014   Procedure: AV NODE ABLATION;  Surgeon: [name], MD;  Location: [facility] CATH LAB;  Service: Cardiovascular;  Laterality: N/A;   CARDIAC CATHETERIZATION  2014   [facility]   CARDIAC ELECTROPHYSIOLOGY STUDY AND ABLATION     CHOLECYSTECTOMY  2010   COLONOSCOPY  2010   polyps   COLONOSCOPY WITH PROPOFOL N/A 08/29/2017   TAs, no f/u recommended Alice Reichert, Benay Pike, MD)   ESOPHAGOGASTRODUODENOSCOPY (EGD) WITH PROPOFOL N/A 08/29/2017   benign biopsies Alice Reichert, Benay Pike, MD)   [place]  2012   EGD biopsy led to bleeding - needed laparotomy to stop bleed (Dr Jimmye Norman at [facility])   [place] N/A 04/22/2014   STJ single chamber pacemaker implanted by Dr [name]   TRANSESOPHAGEAL ECHOCARDIOGRAM WITH CARDIOVERSION  2014   [facility]   UPPER GI ENDOSCOPY  2012   with polypectomy   VAGINAL HYSTERECTOMY  1973   elective; ovaries remained      Allergies:   Gadolinium derivatives, Contrast media [iodinated diagnostic agents], Gabapentin, Metronidazole, Other, Sertraline, and Diltiazem hcl   Social History   Tobacco Use   Smoking status: Never Smoker   Smokeless tobacco: Never Used  Vaping Use   Vaping Use: Never used  Substance Use Topics   Alcohol use: Yes    Alcohol/week: 1.0 - 2.0 standard drink    Types: 1 - 2 Glasses of wine per week    Comment: infrequent mixed drinks   Drug use: Never     Current Outpatient Medications on File Prior to Visit  Medication Sig Dispense Refill   cyanocobalamin (,VITAMIN B-12,) 1000 MCG/ML injection Inject 1 mL (1,000 mcg total) into the muscle every 30 (thirty) days.     diphenhydrAMINE (BENADRYL) 25 MG tablet Take 0.5-1 tablets (12.5-25 mg total) by mouth 2 (two) times daily as needed for itching.     escitalopram (LEXAPRO) 10 MG tablet Take 10 mg by mouth daily.     ezetimibe (ZETIA) 10 MG tablet Take 1 tablet (10 mg total) by mouth daily. 90 tablet 3   fluticasone (FLONASE) 50 MCG/ACT nasal spray SPRAY 2 SPRAYS INTO EACH NOSTRIL  EVERY DAY 16 g 3   furosemide (LASIX) 20 MG tablet TAKE 1 TABLET (20 MG TOTAL) BY MOUTH AS NEEDED FOR FLUID OR EDEMA. 30 tablet 3   Polyethyl Glycol-Propyl Glycol (SYSTANE OP) Place 1 drop into both eyes daily as needed (dry eyes).     Rivaroxaban (XARELTO) 15 MG TABS tablet TAKE 1 TABLET DAILY WITH SUPPER 90 tablet 1   triamcinolone (KENALOG) 0.025 % ointment Apply 1 application topically 2 (two) times daily.      Current Facility-Administered Medications on File Prior to Visit  Medication Dose Route Frequency Provider Last Rate Last Admin   cyanocobalamin ((VITAMIN B-12)) injection 1,000 mcg  1,000 mcg Intramuscular Once [name], MD         Family Hx: The patient's family history includes Alcohol abuse in her brother; Arrhythmia in her mother; Breast cancer (age of onset: 62) in her sister; Cancer in her brother; Diabetes in her mother and son; Heart failure  in her brother and brother; Hypertension in her mother; Stroke in her brother and sister.  ROS:   Please see the history of present illness.    Review of Systems  Constitutional: Negative.   HENT: Negative.   Respiratory: Positive for shortness of breath.   Cardiovascular: Negative.   Gastrointestinal: Negative.   Musculoskeletal: Negative.   Neurological: Negative.   Psychiatric/Behavioral: Negative.   All other systems reviewed and are negative.     Labs/Other Tests and Data Reviewed:    Recent Labs: 10/29/2019: TSH 5.53 12/11/2019: ALT 18; BUN 26; Creatinine, Ser 1.17; Hemoglobin 14.4; Platelets 171; Potassium 4.5; Sodium 140   Recent Lipid Panel Lab Results  Component Value Date/Time   CHOL 128 10/15/2019 08:56 AM   CHOL 159 08/15/2016 11:14 AM   TRIG 99.0 10/15/2019 08:56 AM   HDL 56.60 10/15/2019 08:56 AM   HDL 57 08/15/2016 11:14 AM   CHOLHDL 2 10/15/2019 08:56 AM   LDLCALC 51 10/15/2019 08:56 AM   LDLCALC 73 08/15/2016 11:14 AM    Wt Readings from Last 3 Encounters:  04/27/20 140 lb (63.5  kg)  02/03/20 142 lb 1 oz (64.4 kg)  12/25/19 141 lb 6.4 oz (64.1 kg)     Exam:    BP 122/70 (BP Location: Left Arm, Patient Position: Sitting, Cuff Size: Normal)    Pulse 71    Ht 5' 6.25" (1.683 m)    Wt 140 lb (63.5 kg)    SpO2 97%    BMI 22.43 kg/m  Constitutional:  oriented to person, place, and time. No distress.  HENT:  Head: Grossly normal Eyes:  no discharge. No scleral icterus.  Neck: No JVD, no carotid bruits  Cardiovascular: Regular rate and rhythm, no murmurs appreciated Pulmonary/Chest: Clear to auscultation bilaterally, no wheezes or rails Abdominal: Soft.  no distension.  no tenderness.  Musculoskeletal: Normal range of motion Neurological:  normal muscle tone. Coordination normal. No atrophy Skin: Skin warm and dry Psychiatric: normal affect, pleasant   ASSESSMENT & PLAN:    Problem List Items Addressed This Visit      Cardiology Problems   Complete heart block (HCC)   Atrial fibrillation, permanent (HCC) - Primary   Chronic diastolic CHF (congestive heart failure) ([place])    Other Visit Diagnoses    Sinus node dysfunction (HCC)       Cardiac pacemaker in situ       NICM (nonischemic cardiomyopathy) (HCC)         SOB, chronic issue Chronic diastolic and systolic CHF Deconditioned, RV paced,  Paced rhythm Followed by dr. [name], sinus node dysfunction Discussed recent pacemaker download with patient and son who presents with her today  Atrial fib On xarelto No recent falls  Hyperlipidemia Continue Crestor every other day with Zetia Cholesterol at goal   Total encounter time more than 25 minutes  Greater than 50% was spent in counseling and coordination of care with the patient   Signed, Ida Rogue, [facility] [address], [place], [place] [address]

## 2020-04-27 ENCOUNTER — Other Ambulatory Visit: Payer: Self-pay

## 2020-04-27 ENCOUNTER — Ambulatory Visit (INDEPENDENT_AMBULATORY_CARE_PROVIDER_SITE_OTHER): Payer: Medicare Other | Admitting: Cardiovascular Disease

## 2020-04-27 ENCOUNTER — Encounter: Payer: Self-pay | Admitting: Cardiovascular Disease

## 2020-04-27 VITALS — BP 122/70 | HR 71 | Ht 66.25 in | Wt 140.0 lb

## 2020-04-27 DIAGNOSIS — I442 Atrioventricular block, complete: Secondary | ICD-10-CM | POA: Diagnosis not present

## 2020-04-27 DIAGNOSIS — I4821 Permanent atrial fibrillation: Secondary | ICD-10-CM

## 2020-04-27 DIAGNOSIS — I495 Sick sinus syndrome: Secondary | ICD-10-CM

## 2020-04-27 DIAGNOSIS — I428 Other cardiomyopathies: Secondary | ICD-10-CM

## 2020-04-27 DIAGNOSIS — I5032 Chronic diastolic (congestive) heart failure: Secondary | ICD-10-CM

## 2020-04-27 DIAGNOSIS — Z950 Presence of cardiac pacemaker: Secondary | ICD-10-CM

## 2020-04-27 MED ORDER — ROSUVASTATIN CALCIUM 20 MG PO TABS: 20.0000 mg | ORAL_TABLET | ORAL | 3 refills | Status: DC

## 2020-04-27 NOTE — Patient Instructions (Signed)
Medication Instructions:  No changes  If you need a refill on your cardiac medications before your next appointment, please call your pharmacy.    Lab work: No new labs needed   If you have labs (blood work) drawn today and your tests are completely normal, you will receive your results only by: . MyChart Message (if you have MyChart) OR . A paper copy in the mail If you have any lab test that is abnormal or we need to change your treatment, we will call you to review the results.   Testing/Procedures: No new testing needed   Follow-Up: At [facility], you and your health needs are our priority.  As part of our continuing mission to provide you with exceptional heart care, we have created designated Provider Care Teams.  These Care Teams include your primary Cardiologist (physician) and Advanced Practice Providers (APPs -  Physician Assistants and Nurse Practitioners) who all work together to provide you with the care you need, when you need it.  . You will need a follow up appointment in 12 months  . Providers on your designated Care Team:   . Christopher Berge, NP . Ryan Dunn, PA-C . Jacquelyn Visser, PA-C  Any Other Special Instructions Will Be Listed Below (If Applicable).  COVID-19 Vaccine Information can be found at: [email or web address] For questions related to vaccine distribution or appointments, please email [email or web address] or call [phone].     

## 2020-04-29 ENCOUNTER — Encounter: Payer: Medicare Other | Admitting: Internal Medicine

## 2020-05-03 ENCOUNTER — Ambulatory Visit: Payer: Medicare Other | Admitting: Family Medicine

## 2020-05-03 ENCOUNTER — Other Ambulatory Visit: Payer: Self-pay

## 2020-05-03 ENCOUNTER — Encounter: Payer: Self-pay | Admitting: Family Medicine

## 2020-05-03 ENCOUNTER — Ambulatory Visit (INDEPENDENT_AMBULATORY_CARE_PROVIDER_SITE_OTHER): Payer: Medicare Other | Admitting: Family Medicine

## 2020-05-03 VITALS — BP 122/70 | HR 73 | Temp 97.7°F | Ht 66.25 in | Wt 140.2 lb

## 2020-05-03 DIAGNOSIS — F419 Anxiety disorder, unspecified: Secondary | ICD-10-CM

## 2020-05-03 DIAGNOSIS — E538 Deficiency of other specified B group vitamins: Secondary | ICD-10-CM | POA: Diagnosis not present

## 2020-05-03 DIAGNOSIS — S46002A Unspecified injury of muscle(s) and tendon(s) of the rotator cuff of left shoulder, initial encounter: Secondary | ICD-10-CM | POA: Diagnosis not present

## 2020-05-03 DIAGNOSIS — I4821 Permanent atrial fibrillation: Secondary | ICD-10-CM

## 2020-05-03 DIAGNOSIS — R5382 Chronic fatigue, unspecified: Secondary | ICD-10-CM

## 2020-05-03 DIAGNOSIS — H8109 Meniere's disease, unspecified ear: Secondary | ICD-10-CM | POA: Diagnosis not present

## 2020-05-03 DIAGNOSIS — R0609 Other forms of dyspnea: Secondary | ICD-10-CM

## 2020-05-03 MED ORDER — CYANOCOBALAMIN 1000 MCG/ML IJ SOLN
1000.0000 ug | Freq: Once | INTRAMUSCULAR | Status: AC
Start: 2020-05-03 — End: 2020-05-03
  Administered 2020-05-03: 1000 ug via INTRAMUSCULAR

## 2020-05-03 NOTE — Assessment & Plan Note (Addendum)
Ongoing. Saw neurology 01/2020 - ?pseudodementia, started on lexapro - pt doesn't note significant change since starting 3 months ago.

## 2020-05-03 NOTE — Assessment & Plan Note (Addendum)
No formal COPD diagnosis.  Normal spirometry 2018.  Discussed return to pulm for further evaluation of ongoing dyspnea. Consider updating CXR (last done 2016).

## 2020-05-03 NOTE — Assessment & Plan Note (Addendum)
Ongoing for last few weeks. Exam consistent with L RTC partial tear or tendonitis. Rx heating pad, tylenol (avoid NSAID with xarelto use), may continue topical therapies. Provided with exercises from SM pt advisor - she thinks she has resistance band at home. Update if not improving with this.

## 2020-05-03 NOTE — Patient Instructions (Addendum)
B12 shot today. Continue monthly B12 shots.  Call to schedule followup with neurologist to talk about memory, as well as neuropathy symptoms.  For left shoulder - I think you have rotator cuff injury. Try exercises provided today. Ok to continue topical treatment, add tylenol (acetaminophen) 500mg  three times daily, may use heating pad covered in a towel 10-10min several times a day. Let us know if not improving or any worsening to consider referral to ortho or sports medicine.  Return in 3 months for follow up visit.  Return in 6 months for physical/wellness visit

## 2020-05-03 NOTE — Assessment & Plan Note (Signed)
Continue monthly B12 shots.  

## 2020-05-03 NOTE — Assessment & Plan Note (Addendum)
S/p ablation. Sounds regular. Continues xarelto

## 2020-05-03 NOTE — Assessment & Plan Note (Signed)
States dizziness is some better today. Consider hctz trial if recurrent vertigo.

## 2020-05-03 NOTE — Progress Notes (Signed)
Patient ID: Sierra Kaiser, female    DOB: September 16, 1933, 87 y.o.   MRN: [id number]  This visit was conducted in person.  BP 122/70   Pulse 73   Temp 97.7 F (36.5 C) (Temporal)   Ht 5' 6.25" (1.683 m)   Wt 140 lb 4 oz (63.6 kg)   SpO2 97%   BMI 22.47 kg/m    CC: 3 mo f/u visit, discuss L arm pain  Subjective:   HPI: Sierra Kaiser is a 87 y.o. female presenting on 05/03/2020 for Follow-up (Here for 3 mo f/u.  Wants to discuss taking 2 Lexapro tabs to help with sleep.) and Arm Pain (C/o left arm pain from elbow up to shoulder, worsening.  Started 2-3 wks ago.  Beginning to have trouble lifting arm. )   Not feeling too well today - yesterday was busy day with family and only slept 4 hours last night.   R handed.  2 wk h/o L arm pain that starts at elbow and travels up to shoulder, progressively worsening. Difficult to get complete history. No pain at neck, no pain at the bone, the muscle seems to hurt. She's been treating with emu blue cream and linement ointment. She also takes some OTC pain medicine. Denies inciting trauma/injury or fall. She wonders if making more bird houses recently may have aggravated something.   Recently saw Dr Rockey Situ. She also has seen Dr [name] EP and Dr Raul Del pulm.  Recent COVID illness 03/2020. She had fatigue with this, has fully recovered.   Last saw [facility] neurology NP 01/2020 - started lexapro. 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 No firm diagnosis of COPD at this time, spirometry 2018 WNL. She did undergo pulm rehab at [facility] 01/2017.   She states neuropathy is doing better as well as dizziness.      Relevant past medical, surgical, family and social history reviewed and updated as indicated. Interim medical history since our last visit reviewed. Allergies and medications reviewed and updated. Outpatient Medications Prior to Visit  Medication Sig Dispense Refill  . cyanocobalamin (,VITAMIN B-12,) 1000 MCG/ML injection  Inject 1 mL (1,000 mcg total) into the muscle every 30 (thirty) days.    . diphenhydrAMINE (BENADRYL) 25 MG tablet Take 0.5-1 tablets (12.5-25 mg total) by mouth 2 (two) times daily as needed for itching.    . escitalopram (LEXAPRO) 10 MG tablet Take 10 mg by mouth daily.    Marland Kitchen ezetimibe (ZETIA) 10 MG tablet Take 1 tablet (10 mg total) by mouth daily. 90 tablet 3  . fluticasone (FLONASE) 50 MCG/ACT nasal spray SPRAY 2 SPRAYS INTO EACH NOSTRIL EVERY DAY 16 g 3  . furosemide (LASIX) 20 MG tablet TAKE 1 TABLET (20 MG TOTAL) BY MOUTH AS NEEDED FOR FLUID OR EDEMA. 30 tablet 3  . Polyethyl Glycol-Propyl Glycol (SYSTANE OP) Place 1 drop into both eyes daily as needed (dry eyes).    . Rivaroxaban (XARELTO) 15 MG TABS tablet TAKE 1 TABLET DAILY WITH SUPPER 90 tablet 1  . rosuvastatin (CRESTOR) 20 MG tablet Take 1 tablet (20 mg total) by mouth every other day. 45 tablet 3  . triamcinolone (KENALOG) 0.025 % ointment Apply 1 application topically 2 (two) times daily.      Facility-Administered Medications Prior to Visit  Medication Dose Route Frequency Provider Last Rate Last Admin  . cyanocobalamin ((VITAMIN B-12)) injection 1,000 mcg  1,000 mcg Intramuscular Once [name], MD  Per HPI unless specifically indicated in ROS section below Review of Systems Objective:  BP 122/70   Pulse 73   Temp 97.7 F (36.5 C) (Temporal)   Ht 5' 6.25" (1.683 m)   Wt 140 lb 4 oz (63.6 kg)   SpO2 97%   BMI 22.47 kg/m   Wt Readings from Last 3 Encounters:  05/03/20 140 lb 4 oz (63.6 kg)  04/27/20 140 lb (63.5 kg)  02/03/20 142 lb 1 oz (64.4 kg)      Physical Exam Vitals and nursing note reviewed.  Constitutional:      Appearance: Normal appearance. She is not ill-appearing.  Cardiovascular:     Rate and Rhythm: Normal rate and regular rhythm.     Pulses: Normal pulses.     Heart sounds: Normal heart sounds. No murmur heard.   Pulmonary:     Effort: Pulmonary effort is normal. No  respiratory distress.     Breath sounds: Normal breath sounds. No wheezing, rhonchi or rales.  Musculoskeletal:     Right lower leg: No edema.     Left lower leg: No edema.     Comments:  R shoulder WNL L shoulder exam: No deformity of shoulders on inspection. No pain with palpation of shoulder landmarks. LROM in abduction and forward flexion due to discomfort, better with passive ROM. No pain or weakness with testing SITS in ext/int rotation. ++ pain with empty can sign. ++ pain with Speed test. + impingement. Discomfort pain with crossover test. No significant pain with rotation of humeral head in Sutter joint.   Skin:    General: Skin is warm and dry.     Findings: No rash.  Neurological:     Mental Status: She is alert.  Psychiatric:        Mood and Affect: Mood normal.        Behavior: Behavior normal.       Assessment & Plan:  This visit occurred during the SARS-CoV-2 public health emergency.  Safety protocols were in place, including screening questions prior to the visit, additional usage of staff PPE, and extensive cleaning of exam room while observing appropriate contact time as indicated for disinfecting solutions.   Problem List Items Addressed This Visit    Meniere's disease    States dizziness is some better today. Consider hctz trial if recurrent vertigo.       Low serum vitamin B12    Continue monthly B12 shots.       Injury of left rotator cuff - Primary    Ongoing for last few weeks. Exam consistent with L RTC partial tear or tendonitis. Rx heating pad, tylenol (avoid NSAID with xarelto use), may continue topical therapies. Provided with exercises from [facility] pt advisor - she thinks she has resistance band at home. Update if not improving with this.       Chronic fatigue   Chronic dyspnea    No formal COPD diagnosis.  Normal spirometry 2018.  Discussed return to pulm for further evaluation of ongoing dyspnea. Consider updating CXR (last done 2016).        Atrial fibrillation, permanent ([place])    S/p ablation. Sounds regular. Continues xarelto      Anxiety    Ongoing. Saw neurology 01/2020 - ?pseudodementia, started on lexapro - pt doesn't note significant change since starting 3 months ago.           Meds ordered this encounter  Medications  . cyanocobalamin ((VITAMIN B-12)) injection 1,000 mcg  No orders of the defined types were placed in this encounter.   Patient Instructions  B12 shot today. Continue monthly B12 shots.  Call to schedule followup with neurologist to talk about memory, as well as neuropathy symptoms.  For left shoulder - I think you have rotator cuff injury. Try exercises provided today. Ok to continue topical treatment, add tylenol (acetaminophen) 500mg  three times daily, may use heating pad covered in a towel 10-28min several times a day. Let us know if not improving or any worsening to consider referral to ortho or sports medicine.  Return in 3 months for follow up visit.  Return in 6 months for physical/wellness visit  Follow up plan: Return in about 3 months (around 07/31/2020), or if symptoms worsen or fail to improve, for follow up visit.  [name], MD

## 2020-05-12 ENCOUNTER — Other Ambulatory Visit: Payer: Self-pay | Admitting: Cardiovascular Disease

## 2020-05-12 ENCOUNTER — Ambulatory Visit (INDEPENDENT_AMBULATORY_CARE_PROVIDER_SITE_OTHER): Payer: Medicare Other

## 2020-05-12 DIAGNOSIS — I442 Atrioventricular block, complete: Secondary | ICD-10-CM

## 2020-05-12 NOTE — Telephone Encounter (Signed)
Pt's age 87, wt 63.6 kg, SCr 1.17, CrCl 35.3, last ov w/ TG 04/27/20.

## 2020-05-12 NOTE — Telephone Encounter (Signed)
Please review for refill. Thanks!  

## 2020-05-14 LAB — CUP PACEART REMOTE DEVICE CHECK
Battery Remaining Longevity: 131 mo
Battery Remaining Percentage: 95.5 %
Battery Voltage: 2.96 V
Brady Statistic RV Percent Paced: 98 %
Date Time Interrogation Session: 20220309021614
Implantable Lead Implant Date: 20160217
Implantable Lead Location: 753860
Implantable Lead Model: 1948
Implantable Pulse Generator Implant Date: 20160217
Lead Channel Impedance Value: 550 Ohm
Lead Channel Pacing Threshold Amplitude: 0.5 V
Lead Channel Pacing Threshold Pulse Width: 0.4 ms
Lead Channel Sensing Intrinsic Amplitude: 8.5 mV
Lead Channel Setting Pacing Amplitude: 0.75 V
Lead Channel Setting Pacing Pulse Width: 0.4 ms
Lead Channel Setting Sensing Sensitivity: 2.5 mV
Pulse Gen Model: 2240
Pulse Gen Serial Number: 3050195

## 2020-05-20 NOTE — Progress Notes (Signed)
Remote pacemaker transmission.   

## 2020-06-01 ENCOUNTER — Ambulatory Visit: Payer: Medicare Other

## 2020-06-03 ENCOUNTER — Other Ambulatory Visit: Payer: Self-pay

## 2020-06-03 ENCOUNTER — Ambulatory Visit (INDEPENDENT_AMBULATORY_CARE_PROVIDER_SITE_OTHER): Payer: Medicare Other | Admitting: *Deleted

## 2020-06-03 DIAGNOSIS — E538 Deficiency of other specified B group vitamins: Secondary | ICD-10-CM | POA: Diagnosis not present

## 2020-06-03 MED ORDER — CYANOCOBALAMIN 1000 MCG/ML IJ SOLN
1000.0000 ug | Freq: Once | INTRAMUSCULAR | Status: AC
  Administered 2020-06-03: 1000 ug via INTRAMUSCULAR

## 2020-06-03 NOTE — Progress Notes (Signed)
Per orders of Dr. Diona Browner, injection of B12 given by Tammi Sou. Patient tolerated injection well.  PCP out of the office today

## 2020-06-20 ENCOUNTER — Other Ambulatory Visit: Payer: Self-pay | Admitting: *Deleted

## 2020-06-20 DIAGNOSIS — R768 Other specified abnormal immunological findings in serum: Secondary | ICD-10-CM

## 2020-06-20 DIAGNOSIS — R778 Other specified abnormalities of plasma proteins: Secondary | ICD-10-CM

## 2020-06-24 ENCOUNTER — Other Ambulatory Visit: Payer: Medicare Other

## 2020-06-24 ENCOUNTER — Inpatient Hospital Stay: Payer: Medicare Other | Admitting: Oncology

## 2020-06-24 ENCOUNTER — Inpatient Hospital Stay: Payer: Medicare Other

## 2020-07-05 ENCOUNTER — Other Ambulatory Visit: Payer: Self-pay

## 2020-07-05 ENCOUNTER — Emergency Department: Payer: Medicare Other

## 2020-07-05 ENCOUNTER — Telehealth: Payer: Self-pay | Admitting: *Deleted

## 2020-07-05 ENCOUNTER — Encounter: Payer: Self-pay | Admitting: Intensive Care

## 2020-07-05 ENCOUNTER — Emergency Department
Admission: EM | Admit: 2020-07-05 | Discharge: 2020-07-05 | Disposition: A | Discharge status: Home / Self Care | Payer: Medicare Other | Attending: Emergency Medicine | Admitting: Emergency Medicine

## 2020-07-05 DIAGNOSIS — R1030 Lower abdominal pain, unspecified: Secondary | ICD-10-CM | POA: Diagnosis present

## 2020-07-05 DIAGNOSIS — K5792 Diverticulitis of intestine, part unspecified, without perforation or abscess without bleeding: Secondary | ICD-10-CM

## 2020-07-05 DIAGNOSIS — N183 Chronic kidney disease, stage 3 unspecified: Secondary | ICD-10-CM | POA: Diagnosis not present

## 2020-07-05 DIAGNOSIS — K5732 Diverticulitis of large intestine without perforation or abscess without bleeding: Secondary | ICD-10-CM | POA: Diagnosis not present

## 2020-07-05 DIAGNOSIS — I2510 Atherosclerotic heart disease of native coronary artery without angina pectoris: Secondary | ICD-10-CM | POA: Insufficient documentation

## 2020-07-05 DIAGNOSIS — Z950 Presence of cardiac pacemaker: Secondary | ICD-10-CM | POA: Diagnosis not present

## 2020-07-05 DIAGNOSIS — E039 Hypothyroidism, unspecified: Secondary | ICD-10-CM | POA: Insufficient documentation

## 2020-07-05 DIAGNOSIS — I5032 Chronic diastolic (congestive) heart failure: Secondary | ICD-10-CM | POA: Diagnosis not present

## 2020-07-05 DIAGNOSIS — I130 Hypertensive heart and chronic kidney disease with heart failure and stage 1 through stage 4 chronic kidney disease, or unspecified chronic kidney disease: Secondary | ICD-10-CM | POA: Diagnosis not present

## 2020-07-05 LAB — COMPREHENSIVE METABOLIC PANEL
ALT: 11 U/L (ref 0–44)
AST: 15 U/L (ref 15–41)
Albumin: 4.1 g/dL (ref 3.5–5.0)
Alkaline Phosphatase: 53 U/L (ref 38–126)
Anion gap: 7 (ref 5–15)
BUN: 21 mg/dL (ref 8–23)
CO2: 24 mmol/L (ref 22–32)
Calcium: 9.4 mg/dL (ref 8.9–10.3)
Chloride: 109 mmol/L (ref 98–111)
Creatinine, Ser: 0.98 mg/dL (ref 0.44–1.00)
GFR, Estimated: 57 mL/min — ABNORMAL LOW (ref >60)
Glucose, Bld: 109 mg/dL — ABNORMAL HIGH (ref 70–99)
Potassium: 4.4 mmol/L (ref 3.5–5.1)
Sodium: 140 mmol/L (ref 135–145)
Total Bilirubin: 0.9 mg/dL (ref 0.3–1.2)
Total Protein: 6.8 g/dL (ref 6.5–8.1)

## 2020-07-05 LAB — CBC
HCT: 38.2 % (ref 36.0–46.0)
Hemoglobin: 13.2 g/dL (ref 12.0–15.0)
MCH: 33 pg (ref 26.0–34.0)
MCHC: 34.6 g/dL (ref 30.0–36.0)
MCV: 95.5 fL (ref 80.0–100.0)
Platelets: 176 10*3/uL (ref 150–400)
RBC: 4 MIL/uL (ref 3.87–5.11)
RDW: 12.7 % (ref 11.5–15.5)
WBC: 11.6 10*3/uL — ABNORMAL HIGH (ref 4.0–10.5)
nRBC: 0 % (ref 0.0–0.2)

## 2020-07-05 LAB — LIPASE, BLOOD: Lipase: 31 U/L (ref 11–51)

## 2020-07-05 MED ORDER — ONDANSETRON HCL 4 MG/2ML IJ SOLN
4.0000 mg | Freq: Once | INTRAMUSCULAR | Status: AC
  Administered 2020-07-05: 4 mg via INTRAVENOUS
  Filled 2020-07-05: qty 2

## 2020-07-05 MED ORDER — TRAMADOL HCL 50 MG PO TABS: 50.0000 mg | ORAL_TABLET | Freq: Four times a day (QID) | ORAL | 0 refills | Status: DC | PRN

## 2020-07-05 MED ORDER — AMOXICILLIN-POT CLAVULANATE 875-125 MG PO TABS: 1.0000 | ORAL_TABLET | Freq: Two times a day (BID) | ORAL | 0 refills | Status: AC

## 2020-07-05 MED ORDER — MORPHINE SULFATE (PF) 2 MG/ML IV SOLN
2.0000 mg | Freq: Once | INTRAVENOUS | Status: AC
  Administered 2020-07-05: 2 mg via INTRAVENOUS
  Filled 2020-07-05: qty 1

## 2020-07-05 NOTE — ED Provider Notes (Signed)
[facility] Emergency Department Provider Note   ____________________________________________    I have reviewed the triage vital signs and the nursing notes.   HISTORY  Chief Complaint Abdominal Pain     HPI Sierra Kaiser is a 87 y.o. female with extensive past medical history as detailed below including a history of diverticulitis who presents with complaints of lower abdominal pain for approximately 1 week.  She notes she has had some loose stools as well.  No nausea or vomiting.  No fevers or chills.  Has not take anything for this.  She reports the pain is in the lower abdomen, left greater than right.  No dysuria or frequency  Past Medical History:  Diagnosis Date  . Anxiety   . CAD (coronary artery disease)   . CHF (congestive heart failure) ([place])   . Esophageal reflux   . GI bleeding 2012   during EGD necessitating open surgery  . Hiatal hernia   . History of blood transfusion   . History of diverticulitis   . History of UTI   . HTN (hypertension)   . Hyperlipidemia   . Meniere's disease 1970s  . Mitral regurgitation   . Osteopenia 12/11/2015   T score -1.2 femur, -2.0 spine (12/2015)  . Permanent atrial fibrillation ([place])    a. permanent b. s/p PVI RFA at [facility] 10/14 c. failed amio (neuro toxicity) and Tikosyn d. 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 PUD (peptic ulcer disease)   . Raynaud's disease   . SNHL (sensorineural hearing loss)    right ear  . Syncopal episodes   . Tinnitus   . Tinnitus   . Ulcerative colitis ([place])    per prior records  . Urinary incontinence, urge     Patient Active Problem List   Diagnosis Date Noted  . Injury of left rotator cuff 05/03/2020  . S/P placement of cardiac pacemaker 12/04/2019  . Raynaud's disease 12/02/2019  . IgG lambda monoclonal gammopathy 11/08/2019  . Positive ANA (antinuclear antibody) 11/08/2019  . Subclinical hypothyroidism 10/30/2019   . Vitamin D deficiency 10/30/2019  . Orthostatic dizziness 09/24/2019  . Pruritus 10/28/2018  . Medicare annual wellness visit, subsequent 10/18/2018  . Advanced care planning/counseling discussion 10/18/2018  . Anxiety 01/28/2018  . Low serum vitamin B12 11/27/2017  . Right knee pain 07/17/2017  . Chronic fatigue 06/13/2017  . Health maintenance examination 01/30/2017  . Chronic constipation 01/30/2017  . Family circumstance 04/03/2016  . Osteopenia 12/11/2015  . GERD (gastroesophageal reflux disease) 10/18/2015  . Iron deficiency 05/26/2015  . Meniere's disease   . Hyperlipidemia   . CKD (chronic kidney disease) stage 3, GFR 30-59 ml/min ([facility])   . Mitral regurgitation   . Complete heart block ([place]) 05/06/2014  . Atrial fibrillation, permanent ([place]) 04/22/2014  . 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 S/P ablation of atrial fibrillation 07/25/2013  . Chronic anticoagulation 07/25/2013  . Chronic diastolic CHF (congestive heart failure) ([place]) 07/25/2013  . Chronic dyspnea 07/25/2013    Past Surgical History:  Procedure Laterality Date  . APPENDECTOMY  1973  . AV NODE ABLATION N/A 05/06/2014   Procedure: AV NODE ABLATION;  Surgeon: [name], MD;  Location: [facility] CATH LAB;  Service: Cardiovascular;  Laterality: N/A;  . CARDIAC CATHETERIZATION  2014   [facility]  . CARDIAC ELECTROPHYSIOLOGY STUDY AND ABLATION    . CHOLECYSTECTOMY  2010  . COLONOSCOPY  2010   polyps  . COLONOSCOPY  WITH PROPOFOL N/A 08/29/2017   TAs, no f/u recommended [facility], Benay Pike, MD)  . ESOPHAGOGASTRODUODENOSCOPY (EGD) WITH PROPOFOL N/A 08/29/2017   benign biopsies [facility], Benay Pike, MD)  . HEMORRHOID SURGERY    . LAPAROTOMY  2012   EGD biopsy led to bleeding - needed laparotomy to stop bleed (Dr Jimmye Norman at [facility])  . PERMANENT PACEMAKER INSERTION N/A 04/22/2014   STJ single chamber pacemaker implanted by Dr [name]  . TRANSESOPHAGEAL ECHOCARDIOGRAM WITH CARDIOVERSION  2014   [facility]  .  UPPER GI ENDOSCOPY  2012   with polypectomy  . VAGINAL HYSTERECTOMY  1973   elective; ovaries remained    Prior to Admission medications   Medication Sig Start Date End Date Taking? Authorizing Provider  amoxicillin-clavulanate (AUGMENTIN) 875-125 MG tablet Take 1 tablet by mouth 2 (two) times daily for 7 days. 07/05/20 07/12/20 Yes Lavonia Drafts, MD  traMADol (ULTRAM) 50 MG tablet Take 1 tablet (50 mg total) by mouth every 6 (six) hours as needed. 07/05/20 07/05/21 Yes Lavonia Drafts, MD  cyanocobalamin (,VITAMIN B-12,) 1000 MCG/ML injection Inject 1 mL (1,000 mcg total) into the muscle every 30 (thirty) days. 11/27/17   [name], MD  diphenhydrAMINE (BENADRYL) 25 MG tablet Take 0.5-1 tablets (12.5-25 mg total) by mouth 2 (two) times daily as needed for itching. 02/03/20   [name], MD  escitalopram (LEXAPRO) 10 MG tablet Take 10 mg by mouth daily. 02/02/20   [provider]  ezetimibe (ZETIA) 10 MG tablet Take 1 tablet (10 mg total) by mouth daily. 10/30/19   Dunn, Areta Haber, PA-C  fluticasone [facility]) 50 MCG/ACT nasal spray SPRAY 2 SPRAYS INTO EACH NOSTRIL EVERY DAY 05/02/18   [name], MD  furosemide (LASIX) 20 MG tablet TAKE 1 TABLET (20 MG TOTAL) BY MOUTH AS NEEDED FOR FLUID OR EDEMA. 02/01/20   [name], MD  Polyethyl Glycol-Propyl Glycol (SYSTANE OP) Place 1 drop into both eyes daily as needed (dry eyes).    [provider]  rosuvastatin (CRESTOR) 20 MG tablet Take 1 tablet (20 mg total) by mouth every other day. 04/27/20   Minna Merritts, MD  triamcinolone (KENALOG) 0.025 % ointment Apply 1 application topically 2 (two) times daily.  12/02/18   [provider]  XARELTO 15 MG TABS tablet TAKE 1 TABLET BY MOUTH DAILY WITH SUPPER 05/12/20   Minna Merritts, MD     Allergies Gadolinium derivatives, Contrast media [iodinated diagnostic agents], Gabapentin, Metronidazole, Other, Sertraline, and Diltiazem hcl  Family History  Problem  Relation Age of Onset  . Arrhythmia Mother   . Hypertension Mother   . Diabetes Mother   . Heart failure Brother   . Heart failure Brother   . Cancer Brother        colon (with colostomy)  . Breast cancer Sister 42  . Stroke Sister   . Alcohol abuse Brother   . Stroke Brother   . Diabetes Son     Social History Social History   Tobacco Use  . Smoking status: Never Smoker  . Smokeless tobacco: Never Used  Vaping Use  . Vaping Use: Never used  Substance Use Topics  . Alcohol use: Yes    Alcohol/week: 1.0 standard drink    Types: 1 Glasses of wine per week    Comment: 1-2 glasses wine a month  . Drug use: Never    Review of Systems  Constitutional: No fever/chills Eyes: No visual changes.  ENT: No sore throat. Cardiovascular: Denies chest  pain. Respiratory: Denies shortness of breath. Gastrointestinal: As above Genitourinary: As above Musculoskeletal: Negative for back pain. Skin: Negative for rash. Neurological: Negative for headaches or weakness   ____________________________________________   PHYSICAL EXAM:  VITAL SIGNS: ED Triage Vitals  Enc Vitals Group     BP 07/05/20 1219 (!) 142/92     Pulse Rate 07/05/20 1219 75     Resp 07/05/20 1219 16     Temp 07/05/20 1219 98.8 F (37.1 C)     Temp Source 07/05/20 1219 Oral     SpO2 07/05/20 1219 97 %     Weight 07/05/20 1220 63.5 kg (140 lb)     Height 07/05/20 1220 1.74 m (5' 8.5")     Head Circumference --      Peak Flow --      Pain Score 07/05/20 1220 0     Pain Loc --      Pain Edu? --      Excl. in [place]? --     Constitutional: Alert and oriented.   Nose: No congestion/rhinnorhea. Mouth/Throat: Mucous membranes are moist.   Neck:  Painless ROM Cardiovascular: Normal rate, regular rhythm. Good peripheral circulation. Respiratory: Normal respiratory effort.  No retractions. Lungs CTAB. Gastrointestinal: Soft, mild tenderness left lower quadrant, no distention, no CVA  tenderness  Musculoskeletal:  Warm and well perfused Neurologic:  Normal speech and language. No gross focal neurologic deficits are appreciated.  Skin:  Skin is warm, dry and intact. No rash noted. Psychiatric: Mood and affect are normal. Speech and behavior are normal.  ____________________________________________   LABS (all labs ordered are listed, but only abnormal results are displayed)  Labs Reviewed  COMPREHENSIVE METABOLIC PANEL - Abnormal; Notable for the following components:      Result Value   Glucose, Bld 109 (*)    GFR, Estimated 57 (*)    All other components within normal limits  CBC - Abnormal; Notable for the following components:   WBC 11.6 (*)    All other components within normal limits  LIPASE, BLOOD   ____________________________________________  EKG  None ____________________________________________  RADIOLOGY  CT abdomen pelvis, reviewed by me, consistent with diverticulitis, no complications apparent ____________________________________________   PROCEDURES  Procedure(s) performed: No  Procedures   Critical Care performed: No ____________________________________________   INITIAL IMPRESSION / ASSESSMENT AND PLAN / ED COURSE  Pertinent labs & imaging results that were available during my care of the patient were reviewed by me and considered in my medical decision making (see chart for details).  Patient presents with lower abdominal pain as detailed above, given her history suspicious for diverticulitis, less likely UTI, ureterolithiasis  Treated with IV morphine, IV Zofran with good relief.  Lab work is overall reassuring, mild elevation of white blood cell count noted  CT scan consistent with acute sigmoid diverticulitis, no evidence of complications.  Will treat with Augmentin, strict return precautions, patient agrees with this plan    ____________________________________________   FINAL CLINICAL IMPRESSION(S) / ED  DIAGNOSES  Final diagnoses:  Diverticulitis        Note:  This document was prepared using Dragon voice recognition software and may include unintentional dictation errors.   Lavonia Drafts, MD 07/05/20 2140

## 2020-07-05 NOTE — Telephone Encounter (Signed)
Seen at ER, treated for diverticulitis. plz call tomorrow for update on symptoms.

## 2020-07-05 NOTE — ED Triage Notes (Signed)
Patient c/o lower abdominal pain. Reports little diarrhea. Denies N/V HX diverticulitis.

## 2020-07-05 NOTE — Telephone Encounter (Signed)
Patient called stating that she thinks that she may have diverticulitis. Patient stated that she had diverticulitis maybe 20 years ago. Patient stated that she had some lower bowel pain about two weeks ago but it cleared up. Patient stated that she started yesterday with left side lower pain in her bowel area. Patient stated that the pain kept her awake during the night. Patient stated that she may have had a fever last night but her thermometer does not work. Patient stated that she has had diarrhea in the past 2 days. Patient denies any cough, chills or body aches. Patient stated her pain level is about an  8. Patient stated that she does have a runny nose but has had that for years. Patient stated that she is okay to go to an UC. After speaking with Dr. Danise Mina patient was advised that he can work her in at 12:30 or if she feels that she needs to be seen now she can go to an UC. Patient stated that she will go ahead and go to an UC Patient was given information on the [facility]. Patient stated that she is going to get her husband to take her there now.

## 2020-07-06 NOTE — Telephone Encounter (Signed)
Spoke to patient by telephone and was advised that she is feeling better today. Patient stated that the pain has really eased up. Patient stated that she has taken the pain medication and the antibiotic. Patient stated that she has to take the antibiotic for 7 days. Patient stated that she is going to rest and take it easy and do what she feels like doing. Patient was advised to give our office a call if she does not continue to improve. Patient stated that she really appreciated [place] checking on her. Patient was advised to rest and drink plenty of fluids.

## 2020-07-22 ENCOUNTER — Telehealth: Payer: Self-pay

## 2020-07-22 NOTE — Telephone Encounter (Signed)
Pt said was at ED on 07/05/20 with diverticulitis and pt was given pain med and abx. Pt said starting on 07/21/20 on and off lt lower side pain; pain level now is 0 but hen has pain pain level is 8 - 10. Pt describes as sharp spasms when has pain. No N&V&diarrhea. Pt does not have thermometer but does not think has fever.pt has usual SOB that is nothing new. Occasional prod cough with clear phlegm; not had recently. Pt scheduled appt with Dr Darnell Level on 07/23/20 at 9 AM but if pt condition changes or worsens prior to appt pt will go to [facility] or ED. Pt also request B 12 shot at 07/23/20 visit. Sending note to Dr Darnell Level and Lattie Haw CMA.

## 2020-07-22 NOTE — Telephone Encounter (Signed)
Noted. Ok to do B12 shot tomorrow.

## 2020-07-23 ENCOUNTER — Ambulatory Visit (INDEPENDENT_AMBULATORY_CARE_PROVIDER_SITE_OTHER): Payer: Medicare Other | Admitting: Family Medicine

## 2020-07-23 ENCOUNTER — Other Ambulatory Visit: Payer: Self-pay

## 2020-07-23 ENCOUNTER — Encounter: Payer: Self-pay | Admitting: Family Medicine

## 2020-07-23 ENCOUNTER — Telehealth: Payer: Self-pay

## 2020-07-23 VITALS — BP 116/70 | HR 74 | Temp 97.5°F | Ht 68.5 in | Wt 130.2 lb

## 2020-07-23 DIAGNOSIS — G3184 Mild cognitive impairment, so stated: Secondary | ICD-10-CM | POA: Diagnosis not present

## 2020-07-23 DIAGNOSIS — E538 Deficiency of other specified B group vitamins: Secondary | ICD-10-CM

## 2020-07-23 DIAGNOSIS — R1032 Left lower quadrant pain: Secondary | ICD-10-CM | POA: Diagnosis not present

## 2020-07-23 DIAGNOSIS — K5792 Diverticulitis of intestine, part unspecified, without perforation or abscess without bleeding: Secondary | ICD-10-CM

## 2020-07-23 LAB — CBC WITH DIFFERENTIAL/PLATELET
Basophils Absolute: 0.1 10*3/uL (ref 0.0–0.1)
Basophils Relative: 1 % (ref 0.0–3.0)
Eosinophils Absolute: 0.2 10*3/uL (ref 0.0–0.7)
Eosinophils Relative: 4.3 % (ref 0.0–5.0)
HCT: 41.4 % (ref 36.0–46.0)
Hemoglobin: 14.2 g/dL (ref 12.0–15.0)
Lymphocytes Relative: 29.3 % (ref 12.0–46.0)
Lymphs Abs: 1.5 10*3/uL (ref 0.7–4.0)
MCHC: 34.2 g/dL (ref 30.0–36.0)
MCV: 95.9 fl (ref 78.0–100.0)
Monocytes Absolute: 0.5 10*3/uL (ref 0.1–1.0)
Monocytes Relative: 9.7 % (ref 3.0–12.0)
Neutro Abs: 2.8 10*3/uL (ref 1.4–7.7)
Neutrophils Relative %: 55.7 % (ref 43.0–77.0)
Platelets: 204 10*3/uL (ref 150.0–400.0)
RBC: 4.32 Mil/uL (ref 3.87–5.11)
RDW: 13.3 % (ref 11.5–15.5)
WBC: 5.1 10*3/uL (ref 4.0–10.5)

## 2020-07-23 LAB — COMPREHENSIVE METABOLIC PANEL
ALT: 14 U/L (ref 0–35)
AST: 15 U/L (ref 0–37)
Albumin: 4.4 g/dL (ref 3.5–5.2)
Alkaline Phosphatase: 87 U/L (ref 39–117)
BUN: 25 mg/dL — ABNORMAL HIGH (ref 6–23)
CO2: 27 mEq/L (ref 19–32)
Calcium: 9.9 mg/dL (ref 8.4–10.5)
Chloride: 108 mEq/L (ref 96–112)
Creatinine, Ser: 1.15 mg/dL (ref 0.40–1.20)
GFR: 43.52 mL/min — ABNORMAL LOW (ref >60.00)
Glucose, Bld: 90 mg/dL (ref 70–99)
Potassium: 4.3 mEq/L (ref 3.5–5.1)
Sodium: 143 mEq/L (ref 135–145)
Total Bilirubin: 0.7 mg/dL (ref 0.2–1.2)
Total Protein: 7 g/dL (ref 6.0–8.3)

## 2020-07-23 LAB — POC URINALSYSI DIPSTICK (AUTOMATED)
Blood, UA: NEGATIVE
Glucose, UA: NEGATIVE
Nitrite, UA: NEGATIVE
Protein, UA: POSITIVE — AB
Spec Grav, UA: >=1.03 — AB (ref 1.010–1.025)
Urobilinogen, UA: 1 E.U./dL
pH, UA: 5.5 (ref 5.0–8.0)

## 2020-07-23 MED ORDER — AMOXICILLIN-POT CLAVULANATE 875-125 MG PO TABS: 1.0000 | ORAL_TABLET | Freq: Two times a day (BID) | ORAL | 0 refills | Status: AC

## 2020-07-23 MED ORDER — CYANOCOBALAMIN 1000 MCG/ML IJ SOLN
1000.0000 ug | Freq: Once | INTRAMUSCULAR | Status: AC
  Administered 2020-07-23: 1000 ug via INTRAMUSCULAR

## 2020-07-23 NOTE — Patient Instructions (Addendum)
b12 shot today  Continue monthly B12 shots.  You may have repeat diverticulitis flare.  Restart 10 day course of augmentin antibiotic.  Labs and urine test today  Seek urgent care if worsening symptoms.  Ok to continue tylenol use for pain. Recommend low fiber diet while having acute diverticulitis flare, then once symptoms fully resolved, may increase fiber in diet to help prevent future flare.   Diverticulitis  Diverticulitis is when small pouches in your colon (large intestine) get infected or swollen. This causes pain in the belly (abdomen) and watery poop (diarrhea). These pouches are called diverticula. The pouches form in people who have a condition called diverticulosis. What are the causes? This condition may be caused by poop (stool) that gets trapped in the pouches in your colon. The poop lets germs (bacteria) grow in the pouches. This causes the infection. What increases the risk? You are more likely to get this condition if you have small pouches in your colon. The risk is higher if:  You are overweight or very overweight (obese).  You do not exercise enough.  You drink alcohol.  You smoke or use products with tobacco in them.  You eat a diet that has a lot of red meat such as beef, pork, or lamb.  You eat a diet that does not have enough fiber in it.  You are older than 85 years of age. What are the signs or symptoms?  Pain in the belly. Pain is often on the left side, but it may be in other areas.  Fever and feeling cold.  Feeling like you may vomit.  Vomiting.  Having cramps.  Feeling full.  Changes to how often you poop.  Blood in your poop. How is this treated? Most cases are treated at home by:  Taking over-the-counter pain medicines.  Following a clear liquid diet.  Taking antibiotic medicines.  Resting. Very bad cases may need to be treated at a hospital. This may include:  Not eating or drinking.  Taking prescription pain  medicine.  Getting antibiotic medicines through an IV tube.  Getting fluid and food through an IV tube.  Having surgery. When you are feeling better, your doctor may tell you to have a test to check your colon (colonoscopy). Follow these instructions at home: Medicines  Take over-the-counter and prescription medicines only as told by your doctor. These include: ? Antibiotics. ? Pain medicines. ? Fiber pills. ? Probiotics. ? Stool softeners.  If you were prescribed an antibiotic medicine, take it as told by your doctor. Do not stop taking the antibiotic even if you start to feel better.  Ask your doctor if the medicine prescribed to you requires you to avoid driving or using machinery. Eating and drinking  Follow a diet as told by your doctor.  When you feel better, your doctor may tell you to change your diet. You may need to eat a lot of fiber. Fiber makes it easier to poop (have a bowel movement). Foods with fiber include: ? Berries. ? Beans. ? Lentils. ? Green vegetables.  Avoid eating red meat.   General instructions  Do not use any products that contain nicotine or tobacco, such as cigarettes, e-cigarettes, and chewing tobacco. If you need help quitting, ask your doctor.  Exercise 3 or more times a week. Try to get 30 minutes each time. Exercise enough to sweat and make your heart beat faster.  Keep all follow-up visits as told by your doctor. This is important. Contact a  doctor if:  Your pain does not get better.  You are not pooping like normal. Get help right away if:  Your pain gets worse.  Your symptoms do not get better.  Your symptoms get worse very fast.  You have a fever.  You vomit more than one time.  You have poop that is: ? Bloody. ? Black. ? Tarry. Summary  This condition happens when small pouches in your colon get infected or swollen.  Take medicines only as told by your doctor.  Follow a diet as told by your doctor.  Keep all  follow-up visits as told by your doctor. This is important. This information is not intended to replace advice given to you by your health care provider. Make sure you discuss any questions you have with your health care provider. Document Revised: 12/02/2018 Document Reviewed: 12/02/2018 Elsevier Patient Education  2021 [employer].

## 2020-07-23 NOTE — Assessment & Plan Note (Addendum)
Recent diverticulitis symptoms did resolve after 7d augmentin course but shortly have recurred. Will Rx augmentin 10d course, reviewed red flags to seek urgent care.  Check UA and CBC/CMP today. In abnormal UA, send UCx.  Low threshold to reimage if not improving as expected

## 2020-07-23 NOTE — Assessment & Plan Note (Signed)
Continue monthly b12 shots.  

## 2020-07-23 NOTE — Telephone Encounter (Signed)
Lvm asking pt to call back.  Need to relay Dr. Synthia Innocent message.  UA results: Plz notify urine returned concentrated. Recommend increase water intake. I have sent a culture to check for infection as well. Do start augmentin antibiotic, let us know if worsening symptoms despite this. Will await labs.

## 2020-07-23 NOTE — Assessment & Plan Note (Signed)
Saw neuro Dr Manuella Ghazi, started on aricept 5mg  daily.

## 2020-07-23 NOTE — Assessment & Plan Note (Signed)
Recent dx at ER through CT scan.  Now with recurrent symptoms anticipate recurrent diverticulitis - see above.

## 2020-07-23 NOTE — Progress Notes (Signed)
Patient ID: Sierra Kaiser, female    DOB: October 15, 1933, 87 y.o.   MRN: [id number]  This visit was conducted in person.  BP 116/70   Pulse 74   Temp (!) 97.5 F (36.4 C) (Temporal)   Ht 5' 8.5" (1.74 m)   Wt 130 lb 3 oz (59.1 kg)   SpO2 96%   BMI 19.51 kg/m    CC: abdominal pain  Subjective:   HPI: Sierra Kaiser is a 87 y.o. female presenting on 07/23/2020 for Abdominal Pain (C/o LLQ abd pain.  Had diarrhea yesterday.  Denies nausea/vomiting.  Started about 2 days ago.  Was seen on 07/05/20 at [facility] ED, dx diverticulitis.  No sure if she's having a flare.  Feels better today. )   Seen at ER 07/05/2020 with CT confirmed acute sigmoid diverticulitis treated with augmentin 7d course. Symptoms fully resolved.   2d h/o L lower abdominal discomfort up to 8/10, describes sharp spasms. 2 nights ago had trouble sleeping due to pain. Yesterday worked all day in the yard. She did have episode of diarrhea with stool urgency and some leaking. Has had 5 episodes of stool incontinence in the past 3 months.   No fevers/chills, nausea/vomiting, dysuria, urinary urgency.  10 lb weight loss since last seen here. Has lost appetite.   COLONOSCOPY WITH PROPOFOL 08/29/2017 - TAs, no f/u recommended [facility], Benay Pike, MD)  Remote h/o diverticulitis, but not in years.  Due for B12 shot today.   Recently saw Dr Manuella Ghazi neurology - dx MCI started on donepezil.      Relevant past medical, surgical, family and social history reviewed and updated as indicated. Interim medical history since our last visit reviewed. Allergies and medications reviewed and updated. Outpatient Medications Prior to Visit  Medication Sig Dispense Refill  . cyanocobalamin (,VITAMIN B-12,) 1000 MCG/ML injection Inject 1 mL (1,000 mcg total) into the muscle every 30 (thirty) days.    Marland Kitchen donepezil (ARICEPT) 5 MG tablet Take 1 tablet (5 mg total) by mouth at bedtime.    Marland Kitchen escitalopram (LEXAPRO) 10 MG tablet Take 10 mg by mouth daily.    Marland Kitchen  ezetimibe (ZETIA) 10 MG tablet Take 1 tablet (10 mg total) by mouth daily. 90 tablet 3  . fluticasone (FLONASE) 50 MCG/ACT nasal spray SPRAY 2 SPRAYS INTO EACH NOSTRIL EVERY DAY 16 g 3  . furosemide (LASIX) 20 MG tablet TAKE 1 TABLET (20 MG TOTAL) BY MOUTH AS NEEDED FOR FLUID OR EDEMA. 30 tablet 3  . Polyethyl Glycol-Propyl Glycol (SYSTANE OP) Place 1 drop into both eyes daily as needed (dry eyes).    . rosuvastatin (CRESTOR) 20 MG tablet Take 1 tablet (20 mg total) by mouth every other day. 45 tablet 3  . traMADol (ULTRAM) 50 MG tablet Take 1 tablet (50 mg total) by mouth every 6 (six) hours as needed. 20 tablet 0  . triamcinolone (KENALOG) 0.025 % ointment Apply 1 application topically 2 (two) times daily.     Alveda Reasons 15 MG TABS tablet TAKE 1 TABLET BY MOUTH DAILY WITH SUPPER 90 tablet 1  . diphenhydrAMINE (BENADRYL) 25 MG tablet Take 0.5-1 tablets (12.5-25 mg total) by mouth 2 (two) times daily as needed for itching.     Facility-Administered Medications Prior to Visit  Medication Dose Route Frequency Provider Last Rate Last Admin  . cyanocobalamin ((VITAMIN B-12)) injection 1,000 mcg  1,000 mcg Intramuscular Once [name], MD         Per HPI unless  specifically indicated in ROS section below Review of Systems Objective:  BP 116/70   Pulse 74   Temp (!) 97.5 F (36.4 C) (Temporal)   Ht 5' 8.5" (1.74 m)   Wt 130 lb 3 oz (59.1 kg)   SpO2 96%   BMI 19.51 kg/m   Wt Readings from Last 3 Encounters:  07/23/20 130 lb 3 oz (59.1 kg)  07/05/20 140 lb (63.5 kg)  05/03/20 140 lb 4 oz (63.6 kg)      Physical Exam Vitals and nursing note reviewed.  Constitutional:      Appearance: Normal appearance. She is not ill-appearing.  Eyes:     Extraocular Movements: Extraocular movements intact.     Pupils: Pupils are equal, round, and reactive to light.  Cardiovascular:     Rate and Rhythm: Normal rate and regular rhythm.     Pulses: Normal pulses.     Heart sounds: Normal heart  sounds. No murmur heard.   Pulmonary:     Effort: Pulmonary effort is normal. No respiratory distress.     Breath sounds: Normal breath sounds. No wheezing, rhonchi or rales.  Abdominal:     General: Abdomen is flat. Bowel sounds are normal. There is no distension.     Palpations: Abdomen is soft. There is no mass.     Tenderness: There is abdominal tenderness (mild-mod) in the left lower quadrant. There is guarding. There is no right CVA tenderness, left CVA tenderness or rebound.     Hernia: No hernia is present.  Musculoskeletal:     Right lower leg: No edema.     Left lower leg: No edema.  Skin:    General: Skin is warm and dry.     Findings: No rash.  Neurological:     Mental Status: She is alert.  Psychiatric:        Mood and Affect: Mood normal.        Behavior: Behavior normal.       Results for orders placed or performed in visit on 07/23/20  POCT Urinalysis Dipstick (Automated)  Result Value Ref Range   Color, UA dark yellow    Clarity, UA clear    Glucose, UA Negative Negative   Bilirubin, UA 2+    Ketones, UA +/-    Spec Grav, UA >=1.030 (A) 1.010 - 1.025   Blood, UA negative    pH, UA 5.5 5.0 - 8.0   Protein, UA Positive (A) Negative   Urobilinogen, UA 1.0 0.2 or 1.0 E.U./dL   Nitrite, UA negative    Leukocytes, UA Small (1+) (A) Negative   Assessment & Plan:  This visit occurred during the SARS-CoV-2 public health emergency.  Safety protocols were in place, including screening questions prior to the visit, additional usage of staff PPE, and extensive cleaning of exam room while observing appropriate contact time as indicated for disinfecting solutions.   Problem List Items Addressed This Visit    Acute diverticulitis    Recent dx at ER through CT scan.  Now with recurrent symptoms anticipate recurrent diverticulitis - see above.       Relevant Medications   amoxicillin-clavulanate (AUGMENTIN) 875-125 MG tablet   Low serum vitamin B12    Continue  monthly b12 shots.       LLQ abdominal pain - Primary    Recent diverticulitis symptoms did resolve after 7d augmentin course but shortly have recurred. Will Rx augmentin 10d course, reviewed red flags to seek urgent care.  Check UA  and CBC/CMP today. In abnormal UA, send UCx.  Low threshold to reimage if not improving as expected      Relevant Orders   Comprehensive metabolic panel   CBC with Differential/Platelet   POCT Urinalysis Dipstick (Automated) (Completed)   Urine Culture   Mild cognitive impairment with memory loss    Saw neuro Dr Manuella Ghazi, started on aricept 5mg  daily.          Meds ordered this encounter  Medications  . cyanocobalamin ((VITAMIN B-12)) injection 1,000 mcg  . amoxicillin-clavulanate (AUGMENTIN) 875-125 MG tablet    Sig: Take 1 tablet by mouth 2 (two) times daily for 10 days.    Dispense:  20 tablet    Refill:  0   Orders Placed This Encounter  Procedures  . Urine Culture  . Comprehensive metabolic panel  . CBC with Differential/Platelet  . POCT Urinalysis Dipstick (Automated)    Patient instructions: b12 shot today  Continue monthly B12 shots.  You may have repeat diverticulitis flare.  Restart 10 day course of augmentin antibiotic.  Labs and urine test today  Seek urgent care if worsening symptoms.  Ok to continue tylenol use for pain. Recommend low fiber diet while having acute diverticulitis flare, then once symptoms fully resolved, may increase fiber in diet to help prevent future flare.   Follow up plan: Return if symptoms worsen or fail to improve.  [name], MD

## 2020-07-24 LAB — URINE CULTURE
MICRO NUMBER:: 11916417
SPECIMEN QUALITY:: ADEQUATE

## 2020-07-26 NOTE — Telephone Encounter (Signed)
Spoke with pt relaying results and Dr. Synthia Innocent message.  Pt verbalizes understanding and states she has started abx and is feeling much better.  Encouraged pt to finish med unless we tell her different.  Pt verbalizes understanding.

## 2020-08-03 ENCOUNTER — Encounter: Payer: Self-pay | Admitting: Family Medicine

## 2020-08-03 ENCOUNTER — Ambulatory Visit (INDEPENDENT_AMBULATORY_CARE_PROVIDER_SITE_OTHER): Payer: Medicare Other | Admitting: Family Medicine

## 2020-08-03 ENCOUNTER — Other Ambulatory Visit: Payer: Self-pay

## 2020-08-03 DIAGNOSIS — R1032 Left lower quadrant pain: Secondary | ICD-10-CM

## 2020-08-03 DIAGNOSIS — E538 Deficiency of other specified B group vitamins: Secondary | ICD-10-CM

## 2020-08-03 DIAGNOSIS — G3184 Mild cognitive impairment, so stated: Secondary | ICD-10-CM | POA: Diagnosis not present

## 2020-08-03 NOTE — Assessment & Plan Note (Addendum)
Symptoms have largely resolved.  Discussed adding probiotic after 2 courses of augmentin to replenish healthy gut flora.  Discussed slowly increasing fiber in diet when fully feeling better as preventative measure for future diverticulitis flares.

## 2020-08-03 NOTE — Assessment & Plan Note (Signed)
MCI vs pseudodementia - followed by neurology, continues lexapro and aricept.

## 2020-08-03 NOTE — Patient Instructions (Addendum)
After recent antibiotic courses, start taking probiotic for next few weeks (either activia in yogurt form or phillips colon health probiotic capsules).  Once you're feeling fully back to normal, then may increase fiber in the diet to prevent future diverticulitis flares.  Good to see you today.  Return as needed or in 3 months for wellness visit/physical.

## 2020-08-03 NOTE — Assessment & Plan Note (Signed)
Continue monthly b12 shots.  

## 2020-08-03 NOTE — Progress Notes (Signed)
Patient ID: Sierra Kaiser, female    DOB: 1933-08-24, 87 y.o.   MRN: [id number]  This visit was conducted in person.  BP 122/64   Pulse 73   Temp 97.9 F (36.6 C) (Temporal)   Ht 5' 8.5" (1.74 m)   Wt 132 lb 7 oz (60.1 kg)   SpO2 97%   BMI 19.84 kg/m    CC: 3 mo f/u visit  Subjective:   HPI: Sierra Kaiser is a 87 y.o. female presenting on 08/03/2020 for Follow-up (Here for 3 mo f/u.)   "I think I'm better". Normal BM this morning. Some ongoing LLQ discomfort. Appetite is improving.  See prior note for details. Seen here last week with LLQ abd pain concern for diverticulitis flare, treated with augmentin course. UCx negative for infection. She had recently had ER visit for CT confirmed acute sigmoid diverticulitis initially treated with 7d augmentin course.   She has been drinking ensure daily.   COLONOSCOPY WITH PROPOFOL 08/29/2017 - TAs, no f/u recommended [facility], Benay Pike, MD)  Recently saw Dr Manuella Ghazi neurology - dx MCI started on donepezil. 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   Did see Dr Rockey Situ 04/2020 - overall stable period. Sees Dr [name] for sinus node dysfunction on pacemaker. Continues xarelto in h/o afib.   Last CPE 10/2019.  Ho iron infusions (feraheme) latest 11/2019 x2.      Relevant past medical, surgical, family and social history reviewed and updated as indicated. Interim medical history since our last visit reviewed. Allergies and medications reviewed and updated. Outpatient Medications Prior to Visit  Medication Sig Dispense Refill  . cyanocobalamin (,VITAMIN B-12,) 1000 MCG/ML injection Inject 1 mL (1,000 mcg total) into the muscle every 30 (thirty) days.    Marland Kitchen donepezil (ARICEPT) 5 MG tablet Take 1 tablet (5 mg total) by mouth at bedtime.    Marland Kitchen escitalopram (LEXAPRO) 10 MG tablet Take 10 mg by mouth daily.    Marland Kitchen ezetimibe (ZETIA) 10 MG tablet Take 1 tablet (10 mg total) by mouth daily. 90 tablet 3  . fluticasone (FLONASE)  50 MCG/ACT nasal spray SPRAY 2 SPRAYS INTO EACH NOSTRIL EVERY DAY 16 g 3  . furosemide (LASIX) 20 MG tablet TAKE 1 TABLET (20 MG TOTAL) BY MOUTH AS NEEDED FOR FLUID OR EDEMA. 30 tablet 3  . Polyethyl Glycol-Propyl Glycol (SYSTANE OP) Place 1 drop into both eyes daily as needed (dry eyes).    . rosuvastatin (CRESTOR) 20 MG tablet Take 1 tablet (20 mg total) by mouth every other day. 45 tablet 3  . triamcinolone (KENALOG) 0.025 % ointment Apply 1 application topically 2 (two) times daily.     Alveda Reasons 15 MG TABS tablet TAKE 1 TABLET BY MOUTH DAILY WITH SUPPER 90 tablet 1  . traMADol (ULTRAM) 50 MG tablet Take 1 tablet (50 mg total) by mouth every 6 (six) hours as needed. 20 tablet 0   Facility-Administered Medications Prior to Visit  Medication Dose Route Frequency Provider Last Rate Last Admin  . cyanocobalamin ((VITAMIN B-12)) injection 1,000 mcg  1,000 mcg Intramuscular Once [name], MD         Per HPI unless specifically indicated in ROS section below Review of Systems Objective:  BP 122/64   Pulse 73   Temp 97.9 F (36.6 C) (Temporal)   Ht 5' 8.5" (1.74 m)   Wt 132 lb 7 oz (60.1 kg)   SpO2 97%   BMI 19.84 kg/m  Wt Readings from Last 3 Encounters:  08/03/20 132 lb 7 oz (60.1 kg)  07/23/20 130 lb 3 oz (59.1 kg)  07/05/20 140 lb (63.5 kg)      Physical Exam Vitals and nursing note reviewed.  Constitutional:      Appearance: Normal appearance. She is not ill-appearing.  Eyes:     Extraocular Movements: Extraocular movements intact.     Pupils: Pupils are equal, round, and reactive to light.  Cardiovascular:     Rate and Rhythm: Normal rate and regular rhythm.     Pulses: Normal pulses.     Heart sounds: Normal heart sounds. No murmur heard.   Pulmonary:     Effort: Pulmonary effort is normal. No respiratory distress.     Breath sounds: Normal breath sounds. No wheezing, rhonchi or rales.  Musculoskeletal:     Right lower leg: No edema.     Left lower leg:  No edema.  Skin:    General: Skin is warm and dry.     Findings: No rash.  Neurological:     Mental Status: She is alert.  Psychiatric:        Mood and Affect: Mood normal.        Behavior: Behavior normal.       Results for orders placed or performed in visit on 07/23/20  Urine Culture   Specimen: Urine  Result Value Ref Range   MICRO NUMBER: [id number]    SPECIMEN QUALITY: Adequate    Sample Source NOT GIVEN    STATUS: FINAL    Result:      Mixed genital flora isolated. These superficial bacteria are not indicative of a urinary tract infection. No further organism identification is warranted on this specimen. If clinically indicated, recollect clean-catch, mid-stream urine and transfer  immediately to Urine Culture Transport Tube.   Comprehensive metabolic panel  Result Value Ref Range   Sodium 143 135 - 145 mEq/L   Potassium 4.3 3.5 - 5.1 mEq/L   Chloride 108 96 - 112 mEq/L   CO2 27 19 - 32 mEq/L   Glucose, Bld 90 70 - 99 mg/dL   BUN 25 (H) 6 - 23 mg/dL   Creatinine, Ser 1.15 0.40 - 1.20 mg/dL   Total Bilirubin 0.7 0.2 - 1.2 mg/dL   Alkaline Phosphatase 87 39 - 117 U/L   AST 15 0 - 37 U/L   ALT 14 0 - 35 U/L   Total Protein 7.0 6.0 - 8.3 g/dL   Albumin 4.4 3.5 - 5.2 g/dL   GFR 43.52 (L) >60.00 mL/min   Calcium 9.9 8.4 - 10.5 mg/dL  CBC with Differential/Platelet  Result Value Ref Range   WBC 5.1 4.0 - 10.5 K/uL   RBC 4.32 3.87 - 5.11 Mil/uL   Hemoglobin 14.2 12.0 - 15.0 g/dL   HCT 41.4 36.0 - 46.0 %   MCV 95.9 78.0 - 100.0 fl   MCHC 34.2 30.0 - 36.0 g/dL   RDW 13.3 11.5 - 15.5 %   Platelets 204.0 150.0 - 400.0 K/uL   Neutrophils Relative % 55.7 43.0 - 77.0 %   Lymphocytes Relative 29.3 12.0 - 46.0 %   Monocytes Relative 9.7 3.0 - 12.0 %   Eosinophils Relative 4.3 0.0 - 5.0 %   Basophils Relative 1.0 0.0 - 3.0 %   Neutro Abs 2.8 1.4 - 7.7 K/uL   Lymphs Abs 1.5 0.7 - 4.0 K/uL   Monocytes Absolute 0.5 0.1 - 1.0 K/uL   Eosinophils Absolute 0.2  0.0 - 0.7 K/uL    Basophils Absolute 0.1 0.0 - 0.1 K/uL  POCT Urinalysis Dipstick (Automated)  Result Value Ref Range   Color, UA dark yellow    Clarity, UA clear    Glucose, UA Negative Negative   Bilirubin, UA 2+    Ketones, UA +/-    Spec Grav, UA >=1.030 (A) 1.010 - 1.025   Blood, UA negative    pH, UA 5.5 5.0 - 8.0   Protein, UA Positive (A) Negative   Urobilinogen, UA 1.0 0.2 or 1.0 E.U./dL   Nitrite, UA negative    Leukocytes, UA Small (1+) (A) Negative   Assessment & Plan:  This visit occurred during the SARS-CoV-2 public health emergency.  Safety protocols were in place, including screening questions prior to the visit, additional usage of staff PPE, and extensive cleaning of exam room while observing appropriate contact time as indicated for disinfecting solutions.   Problem List Items Addressed This Visit    LLQ abdominal pain    Symptoms have largely resolved.  Discussed adding probiotic after 2 courses of augmentin to replenish healthy gut flora.  Discussed slowly increasing fiber in diet when fully feeling better as preventative measure for future diverticulitis flares.       Low serum vitamin B12    Continue monthly b12 shots       Mild cognitive impairment with memory loss    MCI vs pseudodementia - followed by neurology, continues lexapro and aricept.           No orders of the defined types were placed in this encounter.  No orders of the defined types were placed in this encounter.   Patient Instructions  After recent antibiotic courses, start taking probiotic for next few weeks (either activia in yogurt form or phillips colon health probiotic capsules).  Once you're feeling fully back to normal, then may increase fiber in the diet to prevent future diverticulitis flares.  Good to see you today.  Return as needed or in 3 months for wellness visit/physical.    Follow up plan: Return in about 3 months (around 11/03/2020), or if symptoms worsen or fail to improve, for  annual exam, prior fasting for blood work, medicare wellness visit.  [name], MD

## 2020-08-06 ENCOUNTER — Telehealth: Payer: Self-pay | Admitting: Family Medicine

## 2020-08-06 NOTE — Telephone Encounter (Signed)
Pt is calling in voiced that she would like a Colonoscopy Pt would like a call back from [place]

## 2020-08-06 NOTE — Telephone Encounter (Signed)
Spoke with pt about colonoscopy.  States she hasn't had one in about 15 yrs and can't remember where.  She is ok going to [employer] or [place].

## 2020-08-06 NOTE — Telephone Encounter (Signed)
She had colonoscopy 2019. Doesn't need another one.

## 2020-08-06 NOTE — Telephone Encounter (Signed)
Spoke with pt relaying Dr. G's message. Pt verbalizes understanding.  

## 2020-08-11 ENCOUNTER — Ambulatory Visit (INDEPENDENT_AMBULATORY_CARE_PROVIDER_SITE_OTHER): Payer: Medicare Other

## 2020-08-11 DIAGNOSIS — I442 Atrioventricular block, complete: Secondary | ICD-10-CM

## 2020-08-11 LAB — CUP PACEART REMOTE DEVICE CHECK
Battery Remaining Longevity: 131 mo
Battery Remaining Percentage: 95.5 %
Battery Voltage: 2.96 V
Brady Statistic RV Percent Paced: 98 %
Date Time Interrogation Session: 20220608055548
Implantable Lead Implant Date: 20160217
Implantable Lead Location: 753860
Implantable Lead Model: 1948
Implantable Pulse Generator Implant Date: 20160217
Lead Channel Impedance Value: 540 Ohm
Lead Channel Pacing Threshold Amplitude: 0.5 V
Lead Channel Pacing Threshold Pulse Width: 0.4 ms
Lead Channel Sensing Intrinsic Amplitude: 8.8 mV
Lead Channel Setting Pacing Amplitude: 0.75 V
Lead Channel Setting Pacing Pulse Width: 0.4 ms
Lead Channel Setting Sensing Sensitivity: 2.5 mV
Pulse Gen Model: 2240
Pulse Gen Serial Number: 3050195

## 2020-08-17 ENCOUNTER — Encounter: Payer: Self-pay | Admitting: Internal Medicine

## 2020-08-17 ENCOUNTER — Ambulatory Visit (INDEPENDENT_AMBULATORY_CARE_PROVIDER_SITE_OTHER): Payer: Medicare Other | Admitting: Internal Medicine

## 2020-08-17 ENCOUNTER — Other Ambulatory Visit: Payer: Self-pay

## 2020-08-17 VITALS — BP 142/72 | HR 71 | Ht 68.5 in | Wt 132.0 lb

## 2020-08-17 DIAGNOSIS — I495 Sick sinus syndrome: Secondary | ICD-10-CM | POA: Diagnosis not present

## 2020-08-17 DIAGNOSIS — I442 Atrioventricular block, complete: Secondary | ICD-10-CM | POA: Diagnosis not present

## 2020-08-17 DIAGNOSIS — I5032 Chronic diastolic (congestive) heart failure: Secondary | ICD-10-CM

## 2020-08-17 DIAGNOSIS — Z950 Presence of cardiac pacemaker: Secondary | ICD-10-CM | POA: Diagnosis not present

## 2020-08-17 DIAGNOSIS — I428 Other cardiomyopathies: Secondary | ICD-10-CM

## 2020-08-17 DIAGNOSIS — I4821 Permanent atrial fibrillation: Secondary | ICD-10-CM | POA: Diagnosis not present

## 2020-08-17 MED ORDER — EMPAGLIFLOZIN 10 MG PO TABS: 10.0000 mg | ORAL_TABLET | Freq: Every day | ORAL | 6 refills | Status: DC

## 2020-08-17 NOTE — Progress Notes (Signed)
Patient Care Team: [name], MD as PCP - General (Family Medicine) [name], MD as PCP - Cardiology (Cardiology) Minna Merritts, MD as Consulting Physician (Cardiology)   HPI  Sierra Kaiser is a 87 y.o. female Seen in follow-up for AV ablation for permanent atrial fibrillation with poorly controlled ventricular response. Initially she was very short of breath and the device was reprogrammed  There had been gradual improvement She had undergone previous atrial fibrillation ablation at [facility].     Been well but Shortness of Breath has not improved.   Dyspnea assoc with exertion, provoked by 1 flight of stairs, < 100 yds.    LE edema states she had a fall and swollen is periodically .  DATE TEST EF   3/16 Echo   55-65% MR mod  4/17 Echo   55-65 % MR mild-mod  6/18 Echo  EF 45-50% MR mild-mod  1/19 Echo  EF 45-50% MR mild-mod   Date Cr K Hgb  5/22 1.15 4.3 14.2        The patient denies chest pain, nocturnal dyspnea.  There have been no palpitations, lightheadedness or syncope.    Past Medical History:  Diagnosis Date   Anxiety    CAD (coronary artery disease)    CHF (congestive heart failure) (HCC)    Esophageal reflux    GI bleeding 2012   during EGD necessitating open surgery   Hiatal hernia    History of blood transfusion    History of diverticulitis    History of UTI    HTN (hypertension)    Hyperlipidemia    Meniere's disease 1970s   Mitral regurgitation    Osteopenia 12/11/2015   T score -1.2 femur, -2.0 spine (12/2015)   Permanent atrial fibrillation ([place])    a. permanent b. s/p PVI RFA at [facility] 10/14 c. failed amio (neuro toxicity) and Tikosyn d. 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    PUD (peptic ulcer disease)    Raynaud's disease    SNHL (sensorineural hearing loss)    right ear   Syncopal episodes    Tinnitus    Tinnitus    Ulcerative colitis ([place])    per prior records   Urinary  incontinence, urge     Past Surgical History:  Procedure Laterality Date   APPENDECTOMY  1973   AV NODE ABLATION N/A 05/06/2014   Procedure: AV NODE ABLATION;  Surgeon: [name], MD;  Location: [facility] CATH LAB;  Service: Cardiovascular;  Laterality: N/A;   CARDIAC CATHETERIZATION  2014   [facility]   CARDIAC ELECTROPHYSIOLOGY STUDY AND ABLATION     CHOLECYSTECTOMY  2010   COLONOSCOPY  2010   polyps   COLONOSCOPY WITH PROPOFOL N/A 08/29/2017   TAs, no f/u recommended Alice Reichert, Benay Pike, MD)   ESOPHAGOGASTRODUODENOSCOPY (EGD) WITH PROPOFOL N/A 08/29/2017   benign biopsies Alice Reichert, Benay Pike, MD)   [place]  2012   EGD biopsy led to bleeding - needed laparotomy to stop bleed (Dr Jimmye Norman at [facility])   [place] N/A 04/22/2014   STJ single chamber pacemaker implanted by Dr [name]   TRANSESOPHAGEAL ECHOCARDIOGRAM WITH CARDIOVERSION  2014   [facility]   UPPER GI ENDOSCOPY  2012   with polypectomy   VAGINAL HYSTERECTOMY  1973   elective; ovaries remained    Current Outpatient Medications  Medication Sig Dispense Refill   cyanocobalamin (,VITAMIN B-12,)  1000 MCG/ML injection Inject 1 mL (1,000 mcg total) into the muscle every 30 (thirty) days.     donepezil (ARICEPT) 5 MG tablet Take 1 tablet (5 mg total) by mouth at bedtime.     escitalopram (LEXAPRO) 10 MG tablet Take 10 mg by mouth daily.     ezetimibe (ZETIA) 10 MG tablet Take 1 tablet (10 mg total) by mouth daily. 90 tablet 3   fluticasone (FLONASE) 50 MCG/ACT nasal spray SPRAY 2 SPRAYS INTO EACH NOSTRIL EVERY DAY 16 g 3   furosemide (LASIX) 20 MG tablet TAKE 1 TABLET (20 MG TOTAL) BY MOUTH AS NEEDED FOR FLUID OR EDEMA. 30 tablet 3   Polyethyl Glycol-Propyl Glycol (SYSTANE OP) Place 1 drop into both eyes daily as needed (dry eyes).     rosuvastatin (CRESTOR) 20 MG tablet Take 1 tablet (20 mg total) by mouth every other day. 45 tablet 3   triamcinolone (KENALOG) 0.025 % ointment Apply 1  application topically 2 (two) times daily.      XARELTO 15 MG TABS tablet TAKE 1 TABLET BY MOUTH DAILY WITH SUPPER 90 tablet 1   Current Facility-Administered Medications  Medication Dose Route Frequency Provider Last Rate Last Admin   cyanocobalamin ((VITAMIN B-12)) injection 1,000 mcg  1,000 mcg Intramuscular Once [name], MD        Allergies  Allergen Reactions   Gadolinium Derivatives Itching and Rash    Persisted for months   Contrast Media [Iodinated Diagnostic Agents] Hives        Gabapentin Other (See Comments)    Affected mood bad   Metronidazole Itching and Other (See Comments)    Hands really red   Other    Sertraline Other (See Comments)    tremors   Diltiazem Hcl Itching and Rash    Review of Systems negative except from HPI and PMH  Physical Exam BP (!) 142/72 (BP Location: Left Arm, Patient Position: Sitting, Cuff Size: Normal)   Pulse 71   Ht 5' 8.5" (1.74 m)   Wt 132 lb (59.9 kg)   SpO2 93%   BMI 19.78 kg/m  Well developed and nourished in no acute distress HENT normal Neck supple with JVP- Flat Lungs Clear Irregular rate and rhythm, 2/6 murmur Abd-soft with active BS No Clubbing cyanosis  No edema Skin-warm and dry A & Oriented  Grossly normal sensory and motor function Upper extremity strength was equal bilaterally and reflexes were intact   ECG ventricular pacing at 72 with underlying atrial fibrillation -17/45 Assessment and  Plan  Atrial fibrillation  Dyspnea on exertion  Complete heart block status post AV ablation  HFpEF   Normal>> 45-50%  LV function   MR mod  Anticoagulation    On Anticoagulation;  No bleeding issues   Dyspnea got better a little bit with exercise; her intercurrent illness has been associated with its worsening.  She is back in exercise.  LVEF was stable 1/19.  We will continue to observe but hold off on CRT upgrade  Not sure of the mechanism of her arm pain.  No pain with motion; neurological  exam (rudimentary) was normal  Euvolemic continue current meds but we will undertake a trial for 5 days of taking her diuretics daily to see if it improves dyspnea     I,Stephanie Williams,acting as a scribe for Virl Axe, MD.,have documented all relevant documentation on the behalf of Virl Axe, MD,as directed by  Virl Axe, MD while in the presence of Virl Axe, MD.  I, Virl Axe, MD, have reviewed all documentation for this visit. The documentation on 08/17/20 for the exam, diagnosis, procedures, and orders are all accurate and complete.

## 2020-08-17 NOTE — Progress Notes (Signed)
Patient Care Team: [name], MD as PCP - General (Family Medicine) [name], MD as PCP - Cardiology (Cardiology) Minna Merritts, MD as Consulting Physician (Cardiology)   HPI  Sierra Kaiser is a 87 y.o. female Seen in follow-up for AV ablation for permanent atrial fibrillation with poorly controlled ventricular response. Initially she was very short of breath and the device was reprogrammed  There had been gradual improvement She had undergone previous atrial fibrillation ablation at [facility].      DATE TEST EF   3/16 Echo   55-65% MR mod  4/17 Echo   55-65 % MR mild-mod  6/18 Echo  EF 45-50% MR mild-mod  1/19 Echo  EF 45-50% MR mild-mod    Date Cr K Hgb  5/22 1.15 4.3 14.2        The patient denies chest pain, nocturnal dyspnea, orthopnea or peripheral edema.  There have been no palpitations, lightheadedness or syncope.  Complains of shortness of breath which is chronic but somewhat better over recent years.  No bleeding.     Past Medical History:  Diagnosis Date   Anxiety    CAD (coronary artery disease)    CHF (congestive heart failure) (HCC)    Esophageal reflux    GI bleeding 2012   during EGD necessitating open surgery   Hiatal hernia    History of blood transfusion    History of diverticulitis    History of UTI    HTN (hypertension)    Hyperlipidemia    Meniere's disease 1970s   Mitral regurgitation    Osteopenia 12/11/2015   T score -1.2 femur, -2.0 spine (12/2015)   Permanent atrial fibrillation ([place])    a. permanent b. s/p PVI RFA at [facility] 10/14 c. failed amio (neuro toxicity) and Tikosyn d. 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    PUD (peptic ulcer disease)    Raynaud's disease    SNHL (sensorineural hearing loss)    right ear   Syncopal episodes    Tinnitus    Tinnitus    Ulcerative colitis ([place])    per prior records   Urinary incontinence, urge     Past Surgical History:  Procedure  Laterality Date   APPENDECTOMY  1973   AV NODE ABLATION N/A 05/06/2014   Procedure: AV NODE ABLATION;  Surgeon: [name], MD;  Location: [facility] CATH LAB;  Service: Cardiovascular;  Laterality: N/A;   CARDIAC CATHETERIZATION  2014   [facility]   CARDIAC ELECTROPHYSIOLOGY STUDY AND ABLATION     CHOLECYSTECTOMY  2010   COLONOSCOPY  2010   polyps   COLONOSCOPY WITH PROPOFOL N/A 08/29/2017   TAs, no f/u recommended Alice Reichert, Benay Pike, MD)   ESOPHAGOGASTRODUODENOSCOPY (EGD) WITH PROPOFOL N/A 08/29/2017   benign biopsies Alice Reichert, Benay Pike, MD)   [place]  2012   EGD biopsy led to bleeding - needed laparotomy to stop bleed (Dr Jimmye Norman at [facility])   [place] 04/22/2014   STJ single chamber pacemaker implanted by Dr [name]   TRANSESOPHAGEAL ECHOCARDIOGRAM WITH CARDIOVERSION  2014   [facility]   UPPER GI ENDOSCOPY  2012   with polypectomy   VAGINAL HYSTERECTOMY  1973   elective; ovaries remained    Current Outpatient Medications  Medication Sig Dispense Refill   cyanocobalamin (,VITAMIN B-12,) 1000 MCG/ML injection Inject 1 mL (1,000 mcg total) into the muscle every 30 (thirty)  days.     donepezil (ARICEPT) 5 MG tablet Take 1 tablet (5 mg total) by mouth at bedtime.     escitalopram (LEXAPRO) 10 MG tablet Take 10 mg by mouth daily.     ezetimibe (ZETIA) 10 MG tablet Take 1 tablet (10 mg total) by mouth daily. 90 tablet 3   fluticasone (FLONASE) 50 MCG/ACT nasal spray SPRAY 2 SPRAYS INTO EACH NOSTRIL EVERY DAY 16 g 3   furosemide (LASIX) 20 MG tablet TAKE 1 TABLET (20 MG TOTAL) BY MOUTH AS NEEDED FOR FLUID OR EDEMA. 30 tablet 3   Polyethyl Glycol-Propyl Glycol (SYSTANE OP) Place 1 drop into both eyes daily as needed (dry eyes).     rosuvastatin (CRESTOR) 20 MG tablet Take 1 tablet (20 mg total) by mouth every other day. 45 tablet 3   triamcinolone (KENALOG) 0.025 % ointment Apply 1 application topically 2 (two) times daily.      XARELTO 15  MG TABS tablet TAKE 1 TABLET BY MOUTH DAILY WITH SUPPER 90 tablet 1   Current Facility-Administered Medications  Medication Dose Route Frequency Provider Last Rate Last Admin   cyanocobalamin ((VITAMIN B-12)) injection 1,000 mcg  1,000 mcg Intramuscular Once [name], MD        Allergies  Allergen Reactions   Gadolinium Derivatives Itching and Rash    Persisted for months   Contrast Media [Iodinated Diagnostic Agents] Hives        Gabapentin Other (See Comments)    Affected mood bad   Metronidazole Itching and Other (See Comments)    Hands really red   Other    Sertraline Other (See Comments)    tremors   Diltiazem Hcl Itching and Rash    Review of Systems negative except from HPI and PMH  Physical Exam Ht 5' 8.5" (1.74 m)   BMI 19.84 kg/m  Well developed and nourished in no acute distress HENT normal Neck supple with JVP-7 cm Clear Regular rate and rhythm, 2/6 murmur Abd-soft with active BS No Clubbing cyanosis edema Skin-warm and dry A & Oriented  Grossly normal sensory and motor function Upper extremity strength was equal bilaterally and reflexes were intact   ECG ventricular pacing at 72 with underlying atrial fibrillation -17/45 Assessment and  Plan  Atrial fibrillation-  Dyspnea on exertion  Complete heart block status post AV ablation  HFpEF   Normal>> 45-50%  LV function   MR mod  Anticoagulation    Atrial fibrillation permanent.  Anticoagulated with Xarelto.  Continue current 15 mg.  Hemoglobin assessment recently was within normal limits.  Dyspnea remains chronic.  No evidence of volume overload.  Some degree of HFpEF.  Given that, we will start her on SGLT2-Jardiance at 10 mg daily.  Device function is normal.  Heart rate excursion adequate.  Reassessment 3 months        .

## 2020-08-17 NOTE — Patient Instructions (Addendum)
Medication Instructions:  - Your physician has recommended you make the following change in your medication:   1) START Jardiance 10 mg- take 1 tablet by mouth once daily    *If you need a refill on your cardiac medications before your next appointment, please call your pharmacy*   Lab Work: - none ordered  If you have labs (blood work) drawn today and your tests are completely normal, you will receive your results only by: [place] (if you have MyChart) OR A paper copy in the mail If you have any lab test that is abnormal or we need to change your treatment, we will call you to review the results.   Testing/Procedures: - none ordered   Follow-Up: At [facility], you and your health needs are our priority.  As part of our continuing mission to provide you with exceptional heart care, we have created designated Provider Care Teams.  These Care Teams include your primary Cardiologist (physician) and Advanced Practice Providers (APPs -  Physician Assistants and Nurse Practitioners) who all work together to provide you with the care you need, when you need it.  We recommend signing up for the patient portal called "MyChart".  Sign up information is provided on this After Visit Summary.  MyChart is used to connect with patients for Virtual Visits (Telemedicine).  Patients are able to view lab/test results, encounter notes, upcoming appointments, etc.  Non-urgent messages can be sent to your provider as well.   To learn more about what you can do with MyChart, go to [email or web address].    Your next appointment:   3 months  The format for your next appointment:   In Person  Provider:   Virl Axe, MD   Other Instructions N/a

## 2020-08-26 ENCOUNTER — Ambulatory Visit: Payer: Medicare Other

## 2020-09-01 ENCOUNTER — Ambulatory Visit (INDEPENDENT_AMBULATORY_CARE_PROVIDER_SITE_OTHER): Payer: Medicare Other | Admitting: *Deleted

## 2020-09-01 ENCOUNTER — Other Ambulatory Visit: Payer: Self-pay

## 2020-09-01 DIAGNOSIS — E538 Deficiency of other specified B group vitamins: Secondary | ICD-10-CM

## 2020-09-01 MED ORDER — CYANOCOBALAMIN 1000 MCG/ML IJ SOLN
1000.0000 ug | Freq: Once | INTRAMUSCULAR | Status: AC
  Administered 2020-09-01: 1000 ug via INTRAMUSCULAR

## 2020-09-01 NOTE — Progress Notes (Signed)
Per orders of Dr. Gutierrez, injection of Vitamin B12 given in Left Deltoid by Danikah Budzik Simpson. Patient tolerated injection well.  

## 2020-09-03 NOTE — Progress Notes (Signed)
Remote pacemaker transmission.   

## 2020-10-25 ENCOUNTER — Ambulatory Visit: Payer: Medicare Other

## 2020-10-27 ENCOUNTER — Other Ambulatory Visit: Payer: Self-pay | Admitting: Family Medicine

## 2020-10-27 DIAGNOSIS — E559 Vitamin D deficiency, unspecified: Secondary | ICD-10-CM

## 2020-10-27 DIAGNOSIS — E538 Deficiency of other specified B group vitamins: Secondary | ICD-10-CM

## 2020-10-27 DIAGNOSIS — N1832 Chronic kidney disease, stage 3b: Secondary | ICD-10-CM

## 2020-10-27 DIAGNOSIS — E038 Other specified hypothyroidism: Secondary | ICD-10-CM

## 2020-10-27 DIAGNOSIS — E785 Hyperlipidemia, unspecified: Secondary | ICD-10-CM

## 2020-10-29 ENCOUNTER — Other Ambulatory Visit: Payer: Self-pay

## 2020-10-29 ENCOUNTER — Other Ambulatory Visit (INDEPENDENT_AMBULATORY_CARE_PROVIDER_SITE_OTHER): Payer: Medicare Other

## 2020-10-29 DIAGNOSIS — E785 Hyperlipidemia, unspecified: Secondary | ICD-10-CM | POA: Diagnosis not present

## 2020-10-29 DIAGNOSIS — E038 Other specified hypothyroidism: Secondary | ICD-10-CM

## 2020-10-29 DIAGNOSIS — E559 Vitamin D deficiency, unspecified: Secondary | ICD-10-CM | POA: Diagnosis not present

## 2020-10-29 DIAGNOSIS — N1832 Chronic kidney disease, stage 3b: Secondary | ICD-10-CM

## 2020-10-29 DIAGNOSIS — E538 Deficiency of other specified B group vitamins: Secondary | ICD-10-CM | POA: Diagnosis not present

## 2020-10-29 LAB — LIPID PANEL
Cholesterol: 123 mg/dL (ref 0–200)
HDL: 56.4 mg/dL (ref >39.00)
LDL Cholesterol: 52 mg/dL (ref 0–99)
NonHDL: 66.22
Total CHOL/HDL Ratio: 2
Triglycerides: 72 mg/dL (ref 0.0–149.0)
VLDL: 14.4 mg/dL (ref 0.0–40.0)

## 2020-10-29 LAB — COMPREHENSIVE METABOLIC PANEL
ALT: 9 U/L (ref 0–35)
AST: 13 U/L (ref 0–37)
Albumin: 4.1 g/dL (ref 3.5–5.2)
Alkaline Phosphatase: 54 U/L (ref 39–117)
BUN: 23 mg/dL (ref 6–23)
CO2: 27 mEq/L (ref 19–32)
Calcium: 9.7 mg/dL (ref 8.4–10.5)
Chloride: 108 mEq/L (ref 96–112)
Creatinine, Ser: 1.08 mg/dL (ref 0.40–1.20)
GFR: 46.84 mL/min — ABNORMAL LOW (ref >60.00)
Glucose, Bld: 73 mg/dL (ref 70–99)
Potassium: 4.1 mEq/L (ref 3.5–5.1)
Sodium: 142 mEq/L (ref 135–145)
Total Bilirubin: 0.6 mg/dL (ref 0.2–1.2)
Total Protein: 6 g/dL (ref 6.0–8.3)

## 2020-10-29 LAB — TSH: TSH: 6.67 u[IU]/mL — ABNORMAL HIGH (ref 0.35–5.50)

## 2020-10-29 LAB — VITAMIN D 25 HYDROXY (VIT D DEFICIENCY, FRACTURES): VITD: 29.24 ng/mL — ABNORMAL LOW (ref 30.00–100.00)

## 2020-10-29 LAB — T4, FREE: Free T4: 0.63 ng/dL (ref 0.60–1.60)

## 2020-10-29 LAB — VITAMIN B12: Vitamin B-12: 191 pg/mL — ABNORMAL LOW (ref 211–911)

## 2020-10-30 LAB — PARATHYROID HORMONE, INTACT (NO CA): PTH: 64 pg/mL (ref 16–77)

## 2020-11-02 ENCOUNTER — Encounter: Payer: Self-pay | Admitting: Family Medicine

## 2020-11-02 ENCOUNTER — Other Ambulatory Visit: Payer: Self-pay

## 2020-11-02 ENCOUNTER — Ambulatory Visit (INDEPENDENT_AMBULATORY_CARE_PROVIDER_SITE_OTHER): Payer: Medicare Other | Admitting: Family Medicine

## 2020-11-02 VITALS — BP 124/66 | HR 64 | Temp 97.6°F | Ht 66.25 in | Wt 130.1 lb

## 2020-11-02 DIAGNOSIS — E538 Deficiency of other specified B group vitamins: Secondary | ICD-10-CM | POA: Diagnosis not present

## 2020-11-02 DIAGNOSIS — Z0001 Encounter for general adult medical examination with abnormal findings: Secondary | ICD-10-CM

## 2020-11-02 DIAGNOSIS — I5032 Chronic diastolic (congestive) heart failure: Secondary | ICD-10-CM

## 2020-11-02 DIAGNOSIS — K5909 Other constipation: Secondary | ICD-10-CM

## 2020-11-02 DIAGNOSIS — D6859 Other primary thrombophilia: Secondary | ICD-10-CM | POA: Diagnosis not present

## 2020-11-02 DIAGNOSIS — E038 Other specified hypothyroidism: Secondary | ICD-10-CM

## 2020-11-02 DIAGNOSIS — E559 Vitamin D deficiency, unspecified: Secondary | ICD-10-CM

## 2020-11-02 DIAGNOSIS — I7300 Raynaud's syndrome without gangrene: Secondary | ICD-10-CM

## 2020-11-02 DIAGNOSIS — Z950 Presence of cardiac pacemaker: Secondary | ICD-10-CM

## 2020-11-02 DIAGNOSIS — H8109 Meniere's disease, unspecified ear: Secondary | ICD-10-CM

## 2020-11-02 DIAGNOSIS — F419 Anxiety disorder, unspecified: Secondary | ICD-10-CM

## 2020-11-02 DIAGNOSIS — N1832 Chronic kidney disease, stage 3b: Secondary | ICD-10-CM

## 2020-11-02 DIAGNOSIS — K219 Gastro-esophageal reflux disease without esophagitis: Secondary | ICD-10-CM

## 2020-11-02 DIAGNOSIS — E785 Hyperlipidemia, unspecified: Secondary | ICD-10-CM

## 2020-11-02 DIAGNOSIS — G3184 Mild cognitive impairment, so stated: Secondary | ICD-10-CM

## 2020-11-02 DIAGNOSIS — I4821 Permanent atrial fibrillation: Secondary | ICD-10-CM

## 2020-11-02 DIAGNOSIS — I442 Atrioventricular block, complete: Secondary | ICD-10-CM

## 2020-11-02 DIAGNOSIS — M8588 Other specified disorders of bone density and structure, other site: Secondary | ICD-10-CM | POA: Diagnosis not present

## 2020-11-02 DIAGNOSIS — D171 Benign lipomatous neoplasm of skin and subcutaneous tissue of trunk: Secondary | ICD-10-CM | POA: Diagnosis not present

## 2020-11-02 DIAGNOSIS — R0609 Other forms of dyspnea: Secondary | ICD-10-CM

## 2020-11-02 DIAGNOSIS — R768 Other specified abnormal immunological findings in serum: Secondary | ICD-10-CM

## 2020-11-02 DIAGNOSIS — D6869 Other thrombophilia: Secondary | ICD-10-CM

## 2020-11-02 DIAGNOSIS — D472 Monoclonal gammopathy: Secondary | ICD-10-CM

## 2020-11-02 MED ORDER — LEVOTHYROXINE SODIUM 25 MCG PO TABS: 25.0000 ug | ORAL_TABLET | Freq: Every day | ORAL | 3 refills | Status: DC

## 2020-11-02 MED ORDER — FAMOTIDINE 20 MG PO TABS: 20.0000 mg | ORAL_TABLET | Freq: Every evening | ORAL | Status: DC | PRN

## 2020-11-02 MED ORDER — CYANOCOBALAMIN 1000 MCG/ML IJ SOLN
1000.0000 ug | Freq: Once | INTRAMUSCULAR | Status: AC
  Administered 2020-11-02: 1000 ug via INTRAMUSCULAR

## 2020-11-02 NOTE — Patient Instructions (Addendum)
Thyroid borderline low - start levothyroxine 36mg daily. Return in 2-3 months for follow up visit with labs.  B12 shot today - you need to continue monthly B12 shots. Call to schedule mammogram and bone density scan at your convenience. Your last mammogram was over 5 years ago. Schedule them both for the same date. [facility]([phone] Call [place] dates of your COVID booster shot.  Call Dr KCaryl Comesto let him know you're not taking jardiance.  Call and schedule an eye exam.  Start taking daily probiotic like align or phillips colon health brand - do this for 1 month and see effect on bowels.  Try over the counter pepcid '20mg'$  at night as needed for reflux  Health Maintenance After Age 9835After age 503 you are at a higher risk for certain long-term diseases and infections as well as injuries from falls. Falls are a major cause of broken bones and head injuries in people who are older than age 85 Getting regular preventive care can help to keep you healthy and well. Preventive care includes getting regular testing and making lifestyle changes as recommended by your health care provider. Talk with your health care provider about: Which screenings and tests you should have. A screening is a test that checks for a disease when you have no symptoms. A diet and exercise plan that is right for you. What should I know about screenings and tests to prevent falls? Screening and testing are the best ways to find a health problem early. Early diagnosis and treatment give you the best chance of managing medical conditions that are common after age 85 Certain conditions and lifestyle choices may make you more likely to have a fall. Your health care provider may recommend: Regular vision checks. Poor vision and conditions such as cataracts can make you more likely to have a fall. If you wear glasses, make sure to get your prescription updated if your vision changes. Medicine review. Work  with your health care provider to regularly review all of the medicines you are taking, including over-the-counter medicines. Ask your health care provider about any side effects that may make you more likely to have a fall. Tell your health care provider if any medicines that you take make you feel dizzy or sleepy. Osteoporosis screening. Osteoporosis is a condition that causes the bones to get weaker. This can make the bones weak and cause them to break more easily. Blood pressure screening. Blood pressure changes and medicines to control blood pressure can make you feel dizzy. Strength and balance checks. Your health care provider may recommend certain tests to check your strength and balance while standing, walking, or changing positions. Foot health exam. Foot pain and numbness, as well as not wearing proper footwear, can make you more likely to have a fall. Depression screening. You may be more likely to have a fall if you have a fear of falling, feel emotionally low, or feel unable to do activities that you used to do. Alcohol use screening. Using too much alcohol can affect your balance and may make you more likely to have a fall. What actions can I take to lower my risk of falls? General instructions Talk with your health care provider about your risks for falling. Tell your health care provider if: You fall. Be sure to tell your health care provider about all falls, even ones that seem minor. You feel dizzy, sleepy, or off-balance. Take over-the-counter and prescription medicines only  as told by your health care provider. These include any supplements. Eat a healthy diet and maintain a healthy weight. A healthy diet includes low-fat dairy products, low-fat (lean) meats, and fiber from whole grains, beans, and lots of fruits and vegetables. Home safety Remove any tripping hazards, such as rugs, cords, and clutter. Install safety equipment such as grab bars in bathrooms and safety rails on  stairs. Keep rooms and walkways well-lit. Activity  Follow a regular exercise program to stay fit. This will help you maintain your balance. Ask your health care provider what types of exercise are appropriate for you. If you need a cane or walker, use it as recommended by your health care provider. Wear supportive shoes that have nonskid soles. Lifestyle Do not drink alcohol if your health care provider tells you not to drink. If you drink alcohol, limit how much you have: 0-1 drink a day for women. 0-2 drinks a day for men. Be aware of how much alcohol is in your drink. In the [place] one drink equals one typical bottle of beer (12 oz), one-half glass of wine (5 oz), or one shot of hard liquor (1 oz). Do not use any products that contain nicotine or tobacco, such as cigarettes and e-cigarettes. If you need help quitting, ask your health care provider. Summary Having a healthy lifestyle and getting preventive care can help to protect your health and wellness after age 43. Screening and testing are the best way to find a health problem early and help you avoid having a fall. Early diagnosis and treatment give you the best chance for managing medical conditions that are more common for people who are older than age 70. Falls are a major cause of broken bones and head injuries in people who are older than age 1. Take precautions to prevent a fall at home. Work with your health care provider to learn what changes you can make to improve your health and wellness and to prevent falls. This information is not intended to replace advice given to you by your health care provider. Make sure you discuss any questions you have with your health care provider. Document Revised: 04/30/2020 Document Reviewed: 02/06/2020 Elsevier Patient Education  2022 [employer].

## 2020-11-02 NOTE — Progress Notes (Signed)
Patient ID: Sierra Kaiser, female    DOB: 14-Mar-1933, 87 y.o.   MRN: [id number]  This visit was conducted in person.  BP 124/66   Pulse 64   Temp 97.6 F (36.4 C) (Temporal)   Ht 5' 6.25" (1.683 m)   Wt 130 lb 2 oz (59 kg)   SpO2 98%   BMI 20.84 kg/m    CC: CPE Subjective:   HPI: Sierra Kaiser is a 87 y.o. female presenting on 11/02/2020 for Medicare Wellness (Wants to discuss acid reflux. )   Did not see health advisor this year.   Hearing Screening   '500Hz'$  '1000Hz'$  '2000Hz'$  '4000Hz'$   Right ear 40 0 0 0  Left ear 40 40 40 0  Comments: C/o decreased hearing in right ear.   Vision Screening - Comments:: Last eye exam, Mar/Apr 2022.  [place] Office Visit from 11/02/2020 in [facility] [facility] [place]  PHQ-2 Total Score 0      States she saw audiologist this year with normal exam.  Fall Risk  11/02/2020 10/15/2019 10/18/2018 01/17/2017 01/12/2017  Falls in the past year? 0 0 1 No No  Number falls in past yr: - 0 0 - -  Injury with Fall? - 0 1 - -  Risk for fall due to : - Medication side effect - - -  Follow up - Falls evaluation completed;Falls prevention discussed - - -    Chronic dizziness in h/o meniere's has seen ENT.  CT  confirmed acute sigmoid diverticulitis 07/2020 treated with augmentin x2 courses Ho iron infusions (feraheme) latest 11/2019 x2.   Saw Dr Manuella Ghazi neurology 05/2020 - dx MCI started on donepezil. 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  Continues monthly B12 shots, latest 6/29.  Sees EP [name]) for afib, exertional dyspnea, complete heart block s/p AV ablation, HFpEF and moderate MR. Continues xarelto. Most recently started jardiance '10mg'$  daily - she did not take this due to cost. Also sees cardiology Rockey Situ).   Borderline hypothyroid - notes fatigue, hot flashes, weight loss.    Preventative: COLONOSCOPY WITH PROPOFOL 08/29/2017 - TAs, no f/u recommended Alice Reichert, Benay Pike, MD)  Well woman - s/p hysterectomy   Breast cancer screening - mammogram 12/2015. She checks breasts at home without concerns. # previously provided several times to schedule mammogram, not done. She notes chronic knot to left lateral side present for years - told lipoma.  DEXA 12/2015 - osteopenia. Discussed calcium and vit D dosing as well as regular weight bearing exercise. Will update DEXA scan.  Lung cancer screening - not eligible  Flu shot yearly  COVID vaccine - [place] 03/2019 x2, booster but unsure how many Pneumovax 2012, [id number] 2016  Tetanus - unsure  zostavax ~2014  shingrix - discussed Advanced directive discussion: has at home. Sister [name]) would be HCPOA. Will bring me copy.  Seat belt use discussed Sunscreen use discussed. no changing moles on skin. Non smoker  Alcohol - none Dentist q6 mo  Eye exam - due Bowel - ongoing diarrhea loose stools - with bowel accidents. This alternates with constipation. No blood in stool or abd pain.  Bladder - ongoing urge incontinence - wards pad    Lives with husband.  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 Interim medical history since our last visit reviewed. Allergies and medications reviewed and updated. Outpatient Medications Prior to Visit  Medication Sig Dispense Refill   cyanocobalamin (,VITAMIN B-12,) 1000 MCG/ML injection Inject 1 mL (1,000 mcg total) into the muscle every 30 (thirty) days.     escitalopram (LEXAPRO) 10 MG tablet Take 10 mg by mouth daily.     ezetimibe (ZETIA) 10 MG tablet Take 1 tablet (10 mg total) by mouth daily. 90 tablet 3   fluticasone (FLONASE) 50 MCG/ACT nasal spray SPRAY 2 SPRAYS INTO EACH NOSTRIL EVERY DAY 16 g 3   furosemide (LASIX) 20 MG tablet  TAKE 1 TABLET (20 MG TOTAL) BY MOUTH AS NEEDED FOR FLUID OR EDEMA. 30 tablet 3   Polyethyl Glycol-Propyl Glycol (SYSTANE OP) Place 1 drop into both eyes daily as needed (dry eyes).     rosuvastatin (CRESTOR) 20 MG tablet Take 1 tablet (20 mg total) by mouth every other day. 45 tablet 3   triamcinolone (KENALOG) 0.025 % ointment Apply 1 application topically 2 (two) times daily.      XARELTO 15 MG TABS tablet TAKE 1 TABLET BY MOUTH DAILY WITH SUPPER 90 tablet 1   donepezil (ARICEPT) 5 MG tablet Take 1 tablet (5 mg total) by mouth at bedtime. (Patient not taking: Reported on 11/02/2020)     empagliflozin (JARDIANCE) 10 MG TABS tablet Take 1 tablet (10 mg total) by mouth daily before breakfast. 30 tablet 6   Facility-Administered Medications Prior to Visit  Medication Dose Route Frequency Provider Last Rate Last Admin   cyanocobalamin ((VITAMIN B-12)) injection 1,000 mcg  1,000 mcg Intramuscular Once [name], MD         Per HPI unless specifically indicated in ROS section below Review of Systems  Constitutional:  Negative for activity change, appetite change, chills, fatigue, fever and unexpected weight change.  HENT:  Negative for hearing loss.   Eyes:  Negative for visual disturbance.  Respiratory:  Negative for cough, chest tightness, shortness of breath and wheezing.   Cardiovascular:  Negative for chest pain, palpitations and leg swelling.  Gastrointestinal:  Positive for diarrhea. Negative for abdominal distention, abdominal pain, blood in stool, constipation, nausea and vomiting.  Endocrine:       Hot flashes  Genitourinary:  Positive for urgency. Negative for difficulty urinating and hematuria.  Musculoskeletal:  Negative for arthralgias, myalgias and neck pain.  Skin:  Negative for rash.  Neurological:  Positive for dizziness. Negative for seizures, syncope and headaches.  Hematological:  Negative for adenopathy. Bruises/bleeds easily.  Psychiatric/Behavioral:  Negative for  dysphoric mood. The patient is not nervous/anxious.    Objective:  BP 124/66   Pulse 64   Temp 97.6 F (36.4 C) (Temporal)   Ht 5' 6.25" (1.683 m)   Wt 130 lb 2 oz (59 kg)   SpO2 98%   BMI 20.84 kg/m   Wt Readings from Last 3 Encounters:  11/02/20 130 lb 2 oz (59 kg)  08/17/20 132 lb (59.9 kg)  08/03/20 132 lb 7 oz (60.1 kg)      Physical Exam Vitals and nursing note reviewed.  Constitutional:      Appearance: Normal appearance. She is not ill-appearing.  HENT:     Head: Normocephalic and atraumatic.     Right Ear: Tympanic membrane, ear canal and external ear normal. There is no impacted cerumen.     Left Ear: Tympanic membrane, ear canal and external ear normal. There is no  impacted cerumen.  Eyes:     General:        Right eye: No discharge.        Left eye: No discharge.     Extraocular Movements: Extraocular movements intact.     Conjunctiva/sclera: Conjunctivae normal.     Pupils: Pupils are equal, round, and reactive to light.  Neck:     Thyroid: No thyroid mass or thyromegaly.     Vascular: No carotid bruit.  Cardiovascular:     Rate and Rhythm: Normal rate and regular rhythm.     Pulses: Normal pulses.     Heart sounds: Normal heart sounds. No murmur heard. Pulmonary:     Effort: Pulmonary effort is normal. No respiratory distress.     Breath sounds: Normal breath sounds. No wheezing, rhonchi or rales.  Abdominal:     General: Bowel sounds are normal. There is no distension.     Palpations: Abdomen is soft. There is no mass.     Tenderness: There is no abdominal tenderness. There is no guarding or rebound.     Hernia: No hernia is present.  Musculoskeletal:     Cervical back: Normal range of motion and neck supple. No rigidity.     Right lower leg: No edema.     Left lower leg: No edema.  Lymphadenopathy:     Cervical: No cervical adenopathy.  Skin:    General: Skin is warm and dry.     Findings: No rash.     Comments: Well circumscribed mobile  subcutaneous mass to left lateral side   Neurological:     General: No focal deficit present.     Mental Status: She is alert. Mental status is at baseline.  Psychiatric:        Mood and Affect: Mood normal.        Behavior: Behavior normal.      Results for orders placed or performed in visit on 10/29/20  Parathyroid hormone, intact (no Ca)  Result Value Ref Range   PTH 64 16 - 77 pg/mL  T4, free  Result Value Ref Range   Free T4 0.63 0.60 - 1.60 ng/dL  Lipid panel  Result Value Ref Range   Cholesterol 123 0 - 200 mg/dL   Triglycerides 72.0 0.0 - 149.0 mg/dL   HDL 56.40 >39.00 mg/dL   VLDL 14.4 0.0 - 40.0 mg/dL   LDL Cholesterol 52 0 - 99 mg/dL   Total CHOL/HDL Ratio 2    NonHDL 66.22   Comprehensive metabolic panel  Result Value Ref Range   Sodium 142 135 - 145 mEq/L   Potassium 4.1 3.5 - 5.1 mEq/L   Chloride 108 96 - 112 mEq/L   CO2 27 19 - 32 mEq/L   Glucose, Bld 73 70 - 99 mg/dL   BUN 23 6 - 23 mg/dL   Creatinine, Ser 1.08 0.40 - 1.20 mg/dL   Total Bilirubin 0.6 0.2 - 1.2 mg/dL   Alkaline Phosphatase 54 39 - 117 U/L   AST 13 0 - 37 U/L   ALT 9 0 - 35 U/L   Total Protein 6.0 6.0 - 8.3 g/dL   Albumin 4.1 3.5 - 5.2 g/dL   GFR 46.84 (L) >60.00 mL/min   Calcium 9.7 8.4 - 10.5 mg/dL  TSH  Result Value Ref Range   TSH 6.67 (H) 0.35 - 5.50 uIU/mL  VITAMIN D 25 Hydroxy (Vit-D Deficiency, Fractures)  Result Value Ref Range   VITD 29.24 (L) 30.00 - 100.00  ng/mL  Vitamin B12  Result Value Ref Range   Vitamin B-12 191 (L) 211 - 911 pg/mL   Lab Results  Component Value Date   WBC 5.1 07/23/2020   HGB 14.2 07/23/2020   HCT 41.4 07/23/2020   MCV 95.9 07/23/2020   PLT 204.0 07/23/2020    Lab Results  Component Value Date   IRON 89 10/15/2019   FERRITIN 299 12/11/2019   Assessment & Plan:  This visit occurred during the SARS-CoV-2 public health emergency.  Safety protocols were in place, including screening questions prior to the visit, additional usage of staff  PPE, and extensive cleaning of exam room while observing appropriate contact time as indicated for disinfecting solutions.   Problem List Items Addressed This Visit     Encounter for general adult medical examination with abnormal findings - Primary (Chronic)    Preventative protocols reviewed and updated unless pt declined. Discussed healthy diet and lifestyle.  Long overdue for mammogram - again discussed this.       Chronic diastolic CHF (congestive heart failure) (HCC)    Chronic exertional dyspnea.  Did not take jardiance due to cost. Advised let cardiology know [name])      Chronic dyspnea   Atrial fibrillation, permanent ([place])   Complete heart block ([place])    S/p AV ablation and permanent pacemaker placement.       Meniere's disease   Hyperlipidemia    Chronic, stable. Continue crestor QOD.  The ASCVD Risk score Mikey Bussing DC Jr., et al., 2013) failed to calculate for the following reasons:   The 2013 ASCVD risk score is only valid for ages 12 to 30       CKD (chronic kidney disease) stage 3, GFR 30-59 ml/min (HCC)    Chronic, stable. Continue to monitor kidney function.       GERD (gastroesophageal reflux disease)    Notes some GERD symptoms - suggested pepcid '20mg'$  nightly PRN      Relevant Medications   famotidine (PEPCID) 20 MG tablet   Osteopenia    Overdue for f/u DEXA scan - I have reordered and asked her to schedule, along with mammogram as overdue for this as well.       Relevant Orders   DG Bone Density   Chronic constipation    More recently with increased diarrhea - ?IBS related - suggested she start probiotic.       Low serum vitamin B12    Levels very low after missing 2 months - restart monthly B12 shots. Update level next visit in 3 months.  Consider IF Ab       Anxiety    Ongoing, recently started on lexapro by neurology - continue this.       Subclinical hypothyroidism    TSH remains elevated, fT4 bordeline low.  Ongoing fatigue.  Pt  interested in trial thyroid medication - will start levothyroxine 3mg daily, reassess at f/u visit.       Relevant Medications   levothyroxine (SYNTHROID) 25 MCG tablet   Vitamin D deficiency    Will need to verify she's taking separate vitamin D       IgG lambda monoclonal gammopathy    Followed by oncology Dr RJanese Banks last seen 12/2019, plan was to rpt SPEP and K/L light chains in 6 months - overdue for this      Positive ANA (antinuclear antibody)    Strong positive ANA test last year - consider updating levels next visit. Lupus evaluation previously negative including  anti-dsDNA, anti-RNP, complement levels.       Raynaud's disease   S/P placement of cardiac pacemaker   Mild cognitive impairment with memory loss    On aricept followed by neurology.       Lipoma    Exam most consistent with benign lipoma at left lateral side      Acquired hypercoagulable state ([place])    Continues xarelto for afib.         Meds ordered this encounter  Medications   cyanocobalamin ((VITAMIN B-12)) injection 1,000 mcg   famotidine (PEPCID) 20 MG tablet    Sig: Take 1 tablet (20 mg total) by mouth at bedtime as needed for heartburn or indigestion.   levothyroxine (SYNTHROID) 25 MCG tablet    Sig: Take 1 tablet (25 mcg total) by mouth daily.    Dispense:  30 tablet    Refill:  3   Orders Placed This Encounter  Procedures   DG Bone Density    Standing Status:   Future    Standing Expiration Date:   11/02/2021    Order Specific Question:   Reason for Exam (SYMPTOM  OR DIAGNOSIS REQUIRED)    Answer:   osteopenia    Order Specific Question:   Preferred imaging location?    Answer:   [place]     Patient instructions: Thyroid borderline low - start levothyroxine 76mg daily. Return in 2-3 months for follow up visit with labs.  B12 shot today - you need to continue monthly B12 shots. Call to schedule mammogram and bone density scan at your convenience. Your last mammogram was  over 5 years ago. Schedule them both for the same date. [facility]([phone] Call [place] dates of your COVID booster shot.  Call Dr KCaryl Comesto let him know you're not taking jardiance.  Call and schedule an eye exam.  Start taking daily probiotic like align or phillips colon health brand - do this for 1 month and see effect on bowels.  Try over the counter pepcid '20mg'$  at night as needed for reflux  Follow up plan: Return in about 3 months (around 02/02/2021) for follow up visit.  JRia Bush MD

## 2020-11-04 DIAGNOSIS — D6859 Other primary thrombophilia: Secondary | ICD-10-CM | POA: Insufficient documentation

## 2020-11-04 DIAGNOSIS — D179 Benign lipomatous neoplasm, unspecified: Secondary | ICD-10-CM | POA: Insufficient documentation

## 2020-11-04 DIAGNOSIS — D6869 Other thrombophilia: Secondary | ICD-10-CM | POA: Insufficient documentation

## 2020-11-04 NOTE — Assessment & Plan Note (Addendum)
Preventative protocols reviewed and updated unless pt declined. Discussed healthy diet and lifestyle.  Long overdue for mammogram - again discussed this.

## 2020-11-04 NOTE — Assessment & Plan Note (Addendum)
Levels very low after missing 2 months - restart monthly B12 shots. Update level next visit in 3 months.  Consider IF Ab

## 2020-11-04 NOTE — Assessment & Plan Note (Addendum)
Chronic, stable. Continue to monitor kidney function.

## 2020-11-04 NOTE — Assessment & Plan Note (Signed)
Continues xarelto for afib.

## 2020-11-04 NOTE — Assessment & Plan Note (Addendum)
Strong positive ANA test last year - consider updating levels next visit. Lupus evaluation previously negative including anti-dsDNA, anti-RNP, complement levels.

## 2020-11-04 NOTE — Assessment & Plan Note (Signed)
Chronic, stable. Continue crestor QOD.  The ASCVD Risk score Mikey Bussing DC Jr., et al., 2013) failed to calculate for the following reasons:   The 2013 ASCVD risk score is only valid for ages 47 to 33

## 2020-11-04 NOTE — Assessment & Plan Note (Addendum)
On aricept followed by neurology.

## 2020-11-04 NOTE — Assessment & Plan Note (Signed)
Chronic exertional dyspnea.  Did not take jardiance due to cost. Advised let cardiology know [name])

## 2020-11-04 NOTE — Assessment & Plan Note (Signed)
TSH remains elevated, fT4 bordeline low.  Ongoing fatigue.  Pt interested in trial thyroid medication - will start levothyroxine 54mg daily, reassess at f/u visit.

## 2020-11-04 NOTE — Assessment & Plan Note (Signed)
Notes some GERD symptoms - suggested pepcid '20mg'$  nightly PRN

## 2020-11-04 NOTE — Assessment & Plan Note (Signed)
More recently with increased diarrhea - ?IBS related - suggested she start probiotic.

## 2020-11-04 NOTE — Assessment & Plan Note (Addendum)
Ongoing, recently started on lexapro by neurology - continue this.

## 2020-11-04 NOTE — Assessment & Plan Note (Signed)
Will need to verify she's taking separate vitamin D

## 2020-11-04 NOTE — Assessment & Plan Note (Signed)
Followed by oncology Dr Janese Banks, last seen 12/2019, plan was to rpt SPEP and K/L light chains in 6 months - overdue for this

## 2020-11-04 NOTE — Assessment & Plan Note (Addendum)
S/p AV ablation and permanent pacemaker placement.

## 2020-11-04 NOTE — Assessment & Plan Note (Signed)
Exam most consistent with benign lipoma at left lateral side

## 2020-11-04 NOTE — Assessment & Plan Note (Signed)
Overdue for f/u DEXA scan - I have reordered and asked her to schedule, along with mammogram as overdue for this as well.

## 2020-11-05 ENCOUNTER — Telehealth: Payer: Self-pay | Admitting: Family Medicine

## 2020-11-05 NOTE — Telephone Encounter (Signed)
Updated pt's chart.  FYI to Dr. Darnell Level.

## 2020-11-05 NOTE — Telephone Encounter (Signed)
Sierra Kaiser called in and wanted to let Dr. Darnell Level know that she got the Covid vaccine 03/11/19 , 04/01/19 and they were pfizer and 12/19/19 and the 07/20/20 both boosters and she was in the hospital 03/10/20 with Covid and quarantine for 10 days and was prescribed prednisone

## 2020-11-09 ENCOUNTER — Other Ambulatory Visit: Payer: Self-pay | Admitting: Physician Assistant

## 2020-11-10 ENCOUNTER — Ambulatory Visit (INDEPENDENT_AMBULATORY_CARE_PROVIDER_SITE_OTHER): Payer: Medicare Other

## 2020-11-10 ENCOUNTER — Other Ambulatory Visit: Payer: Self-pay | Admitting: Cardiovascular Disease

## 2020-11-10 DIAGNOSIS — I495 Sick sinus syndrome: Secondary | ICD-10-CM

## 2020-11-10 LAB — CUP PACEART REMOTE DEVICE CHECK
Battery Remaining Longevity: 66 mo
Battery Remaining Percentage: 48 %
Battery Voltage: 2.96 V
Brady Statistic RV Percent Paced: 99 %
Date Time Interrogation Session: 20220907043556
Implantable Lead Implant Date: 20160217
Implantable Lead Location: 753860
Implantable Lead Model: 1948
Implantable Pulse Generator Implant Date: 20160217
Lead Channel Impedance Value: 540 Ohm
Lead Channel Pacing Threshold Amplitude: 0.5 V
Lead Channel Pacing Threshold Pulse Width: 0.4 ms
Lead Channel Sensing Intrinsic Amplitude: 8.6 mV
Lead Channel Setting Pacing Amplitude: 0.75 V
Lead Channel Setting Pacing Pulse Width: 0.4 ms
Lead Channel Setting Sensing Sensitivity: 2.5 mV
Pulse Gen Model: 2240
Pulse Gen Serial Number: 3050195

## 2020-11-10 NOTE — Telephone Encounter (Signed)
Prescription refill request for Xarelto received.  Indication: afib  Last office visit: Sierra Kaiser, 08/17/2020 Weight: 59 kg  Age: 87 yo  Scr: 1.08, 10/29/2020 CrCl: 35 ml/min   Refill sent.

## 2020-11-18 NOTE — Progress Notes (Signed)
Remote pacemaker transmission.   

## 2020-11-19 ENCOUNTER — Encounter: Payer: Self-pay | Admitting: Family Medicine

## 2020-11-30 ENCOUNTER — Other Ambulatory Visit: Payer: Self-pay

## 2020-11-30 ENCOUNTER — Encounter: Payer: Self-pay | Admitting: Internal Medicine

## 2020-11-30 ENCOUNTER — Ambulatory Visit (INDEPENDENT_AMBULATORY_CARE_PROVIDER_SITE_OTHER): Payer: Medicare Other | Admitting: Internal Medicine

## 2020-11-30 VITALS — BP 138/60 | HR 70 | Ht 66.25 in | Wt 130.0 lb

## 2020-11-30 DIAGNOSIS — I4821 Permanent atrial fibrillation: Secondary | ICD-10-CM

## 2020-11-30 DIAGNOSIS — I428 Other cardiomyopathies: Secondary | ICD-10-CM

## 2020-11-30 DIAGNOSIS — I442 Atrioventricular block, complete: Secondary | ICD-10-CM | POA: Diagnosis not present

## 2020-11-30 DIAGNOSIS — Z950 Presence of cardiac pacemaker: Secondary | ICD-10-CM

## 2020-11-30 DIAGNOSIS — I5032 Chronic diastolic (congestive) heart failure: Secondary | ICD-10-CM

## 2020-11-30 LAB — PACEMAKER DEVICE OBSERVATION

## 2020-11-30 NOTE — Progress Notes (Signed)
Patient Care Team: [name], MD as PCP - General (Family Medicine) [name], MD as PCP - Cardiology (Cardiology) Minna Merritts, MD as Consulting Physician (Cardiology)   HPI  Sierra Kaiser is a 87 y.o. female Seen in follow-up for AV ablation for permanent atrial fibrillation with poorly controlled ventricular response. Initially she was very short of breath and the device was reprogrammed  There had been gradual improvement She had undergone previous atrial fibrillation ablation at [facility].  Because of ongoing struggles with dyspnea, we prescribed Farxiga; she did not take due to cost issues Still with exertional dyspnea but no chest pain nocturnal dyspnea palpitations lightheadedness or syncope  She is quite adamant today about her children and her sister commented negatively about how she spends her money.  "They bless me out "she does not feel threatened. She says her memory is not quite as good as it once  DATE TEST EF   3/16 Echo   55-65% MR mod  4/17 Echo   55-65 % MR mild-mod  6/18 Echo  45-50% MR mild-mod  1/19 Echo  45-50% MR mild-mod  7/20 Echo  45-50% MR mod   Date Cr K Hgb  5/22 1.15 4.3 14.2            Past Medical History:  Diagnosis Date   Anxiety    CAD (coronary artery disease)    CHF (congestive heart failure) ([place])    Esophageal reflux    GI bleeding 2012   during EGD necessitating open surgery   Hiatal hernia    History of blood transfusion    History of diverticulitis    History of UTI    HTN (hypertension)    Hyperlipidemia    Meniere's disease 1970s   Mitral regurgitation    Osteopenia 12/11/2015   T score -1.2 femur, -2.0 spine (12/2015)   Permanent atrial fibrillation ([place])    a. permanent b. s/p PVI RFA at [facility] 10/14 c. failed amio (neuro toxicity) and Tikosyn d. 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    PUD (peptic ulcer disease)    Raynaud's disease    SNHL (sensorineural  hearing loss)    right ear   Syncopal episodes    Tinnitus    Tinnitus    Ulcerative colitis ([place])    per prior records   Urinary incontinence, urge     Past Surgical History:  Procedure Laterality Date   APPENDECTOMY  1973   AV NODE ABLATION N/A 05/06/2014   Procedure: AV NODE ABLATION;  Surgeon: [name], MD;  Location: [facility] CATH LAB;  Service: Cardiovascular;  Laterality: N/A;   CARDIAC CATHETERIZATION  2014   [facility]   CARDIAC ELECTROPHYSIOLOGY STUDY AND ABLATION     CHOLECYSTECTOMY  2010   COLONOSCOPY  2010   polyps   COLONOSCOPY WITH PROPOFOL N/A 08/29/2017   TAs, no f/u recommended Alice Reichert, Benay Pike, MD)   ESOPHAGOGASTRODUODENOSCOPY (EGD) WITH PROPOFOL N/A 08/29/2017   benign biopsies Alice Reichert, Benay Pike, MD)   [place]  2012   EGD biopsy led to bleeding - needed laparotomy to stop bleed ([facility] Jimmye Norman [facility])   [place] N/A 04/22/2014   STJ single chamber pacemaker implanted by Dr [name]   TRANSESOPHAGEAL ECHOCARDIOGRAM WITH CARDIOVERSION  2014   [facility]   UPPER GI ENDOSCOPY  2012   with polypectomy   [place]  elective; ovaries remained    Current Outpatient Medications  Medication Sig Dispense Refill   cyanocobalamin (,VITAMIN B-12,) 1000 MCG/ML injection Inject 1 mL (1,000 mcg total) into the muscle every 30 (thirty) days.     donepezil (ARICEPT) 5 MG tablet Take 5 mg by mouth at bedtime.     escitalopram (LEXAPRO) 10 MG tablet Take 10 mg by mouth daily.     ezetimibe (ZETIA) 10 MG tablet TAKE 1 TABLET BY MOUTH EVERY DAY 90 tablet 0   famotidine (PEPCID) 20 MG tablet Take 1 tablet (20 mg total) by mouth at bedtime as needed for heartburn or indigestion.     fluticasone (FLONASE) 50 MCG/ACT nasal spray SPRAY 2 SPRAYS INTO EACH NOSTRIL EVERY DAY 16 g 3   furosemide (LASIX) 20 MG tablet TAKE 1 TABLET (20 MG TOTAL) BY MOUTH AS NEEDED FOR FLUID OR EDEMA. 30 tablet 3   levothyroxine  (SYNTHROID) 25 MCG tablet Take 1 tablet (25 mcg total) by mouth daily. 30 tablet 3   Polyethyl Glycol-Propyl Glycol (SYSTANE OP) Place 1 drop into both eyes daily as needed (dry eyes).     rosuvastatin (CRESTOR) 20 MG tablet Take 1 tablet (20 mg total) by mouth every other day. 45 tablet 3   triamcinolone (KENALOG) 0.025 % ointment Apply 1 application topically 2 (two) times daily.      XARELTO 15 MG TABS tablet TAKE 1 TABLET BY MOUTH DAILY WITH SUPPER 90 tablet 1   Current Facility-Administered Medications  Medication Dose Route Frequency Provider Last Rate Last Admin   cyanocobalamin ((VITAMIN B-12)) injection 1,000 mcg  1,000 mcg Intramuscular Once [name], MD        Allergies  Allergen Reactions   Gadolinium Derivatives Itching and Rash    Persisted for months   Contrast Media [Iodinated Diagnostic Agents] Hives        Gabapentin Other (See Comments)    Affected mood bad   Metronidazole Itching and Other (See Comments)    Hands really red   Other    Sertraline Other (See Comments)    tremors   Diltiazem Hcl Itching and Rash    Review of Systems negative except from HPI and PMH  Physical Exam BP 138/60 (BP Location: Left Arm, Patient Position: Sitting, Cuff Size: Normal)   Pulse 70   Ht 5' 6.25" (1.683 m)   Wt 130 lb (59 kg)   SpO2 98%   BMI 20.82 kg/m  Well developed and well nourished in no acute distress HENT normal Neck supple with JVP-flat Clear Device pocket well healed; without hematoma or erythema.  There is no tethering  Regular rate and rhythm, no  gallop No / murmur Abd-soft with active BS No Clubbing cyanosis  edema Skin-warm and dry A & Oriented  Grossly normal sensory and motor function  ECG atrial fibrillation with underlying ventricular pacing at 70 Intervals-/17/46    Assessment and  Plan  Atrial fibrillation  Complete heart block status post AV ablation  Dyspnea on exertion  HFpEF   Normal>> 45-50%  LV function   MR  mod  Anticoagulation    Coagulated with Xarelto 15, dosed appropriately for her GFR  With her cardiomyopathy and dyspnea we tried [place]; she did not take secondary to cost and I think this is probably preclusive also for [employer].  She is euvolemic.  Hold off on diuretics as they were not helpful previously  Discussed the situation with her children and her sister, as noted above not feeling threatened,  but I wonder if some of this is related to concerns regarding memory

## 2020-11-30 NOTE — Patient Instructions (Signed)

## 2020-12-07 ENCOUNTER — Ambulatory Visit (INDEPENDENT_AMBULATORY_CARE_PROVIDER_SITE_OTHER): Payer: Medicare Other

## 2020-12-07 ENCOUNTER — Other Ambulatory Visit: Payer: Self-pay

## 2020-12-07 ENCOUNTER — Ambulatory Visit: Payer: Medicare Other

## 2020-12-07 DIAGNOSIS — E538 Deficiency of other specified B group vitamins: Secondary | ICD-10-CM

## 2020-12-07 MED ORDER — CYANOCOBALAMIN 1000 MCG/ML IJ SOLN
1000.0000 ug | Freq: Once | INTRAMUSCULAR | Status: AC
  Administered 2020-12-07: 1000 ug via INTRAMUSCULAR

## 2020-12-07 NOTE — Progress Notes (Signed)
Per orders of Dr. Danise Mina, injection of monthlu B12 1000 mcg/ml given by Pilar Grammes, CMA in Left Deltoid. Patient tolerated injection well.

## 2020-12-18 ENCOUNTER — Other Ambulatory Visit: Payer: Self-pay | Admitting: Family Medicine

## 2020-12-18 DIAGNOSIS — E038 Other specified hypothyroidism: Secondary | ICD-10-CM

## 2020-12-18 DIAGNOSIS — R768 Other specified abnormal immunological findings in serum: Secondary | ICD-10-CM

## 2020-12-18 DIAGNOSIS — N1832 Chronic kidney disease, stage 3b: Secondary | ICD-10-CM

## 2020-12-18 DIAGNOSIS — E538 Deficiency of other specified B group vitamins: Secondary | ICD-10-CM

## 2020-12-21 ENCOUNTER — Other Ambulatory Visit: Payer: Medicare Other

## 2020-12-21 ENCOUNTER — Other Ambulatory Visit (INDEPENDENT_AMBULATORY_CARE_PROVIDER_SITE_OTHER): Payer: Medicare Other

## 2020-12-21 ENCOUNTER — Other Ambulatory Visit: Payer: Self-pay

## 2020-12-21 DIAGNOSIS — E038 Other specified hypothyroidism: Secondary | ICD-10-CM

## 2020-12-21 DIAGNOSIS — R768 Other specified abnormal immunological findings in serum: Secondary | ICD-10-CM

## 2020-12-21 DIAGNOSIS — N1832 Chronic kidney disease, stage 3b: Secondary | ICD-10-CM

## 2020-12-21 DIAGNOSIS — E538 Deficiency of other specified B group vitamins: Secondary | ICD-10-CM

## 2020-12-21 LAB — RENAL FUNCTION PANEL
Albumin: 4.3 g/dL (ref 3.5–5.2)
BUN: 25 mg/dL — ABNORMAL HIGH (ref 6–23)
CO2: 24 mEq/L (ref 19–32)
Calcium: 9.6 mg/dL (ref 8.4–10.5)
Chloride: 109 mEq/L (ref 96–112)
Creatinine, Ser: 1.1 mg/dL (ref 0.40–1.20)
GFR: 45.77 mL/min — ABNORMAL LOW (ref >60.00)
Glucose, Bld: 67 mg/dL — ABNORMAL LOW (ref 70–99)
Phosphorus: 3.5 mg/dL (ref 2.3–4.6)
Potassium: 4.3 mEq/L (ref 3.5–5.1)
Sodium: 146 mEq/L — ABNORMAL HIGH (ref 135–145)

## 2020-12-21 LAB — T4, FREE: Free T4: 0.58 ng/dL — ABNORMAL LOW (ref 0.60–1.60)

## 2020-12-21 LAB — VITAMIN B12: Vitamin B-12: 324 pg/mL (ref 211–911)

## 2020-12-21 LAB — TSH: TSH: 5.34 u[IU]/mL (ref 0.35–5.50)

## 2020-12-21 LAB — C-REACTIVE PROTEIN: CRP: <1 mg/dL (ref 0.5–20.0)

## 2020-12-21 LAB — SEDIMENTATION RATE: Sed Rate: 6 mm/hr (ref 0–30)

## 2020-12-24 LAB — ANA: Anti Nuclear Antibody (ANA): POSITIVE — AB

## 2020-12-24 LAB — ANTI-NUCLEAR AB-TITER (ANA TITER): ANA Titer 1: 1:320 {titer} — ABNORMAL HIGH

## 2020-12-24 LAB — INTRINSIC FACTOR ANTIBODIES: Intrinsic Factor: NEGATIVE

## 2020-12-28 ENCOUNTER — Other Ambulatory Visit: Payer: Self-pay

## 2020-12-28 ENCOUNTER — Encounter: Payer: Self-pay | Admitting: Family Medicine

## 2020-12-28 ENCOUNTER — Ambulatory Visit (INDEPENDENT_AMBULATORY_CARE_PROVIDER_SITE_OTHER): Payer: Medicare Other | Admitting: Family Medicine

## 2020-12-28 DIAGNOSIS — K219 Gastro-esophageal reflux disease without esophagitis: Secondary | ICD-10-CM | POA: Diagnosis not present

## 2020-12-28 DIAGNOSIS — E559 Vitamin D deficiency, unspecified: Secondary | ICD-10-CM

## 2020-12-28 DIAGNOSIS — G3184 Mild cognitive impairment, so stated: Secondary | ICD-10-CM

## 2020-12-28 DIAGNOSIS — R7689 Other specified abnormal immunological findings in serum: Secondary | ICD-10-CM

## 2020-12-28 DIAGNOSIS — R768 Other specified abnormal immunological findings in serum: Secondary | ICD-10-CM

## 2020-12-28 DIAGNOSIS — E538 Deficiency of other specified B group vitamins: Secondary | ICD-10-CM | POA: Diagnosis not present

## 2020-12-28 DIAGNOSIS — F419 Anxiety disorder, unspecified: Secondary | ICD-10-CM

## 2020-12-28 DIAGNOSIS — N1832 Chronic kidney disease, stage 3b: Secondary | ICD-10-CM

## 2020-12-28 DIAGNOSIS — E038 Other specified hypothyroidism: Secondary | ICD-10-CM

## 2020-12-28 DIAGNOSIS — R0989 Other specified symptoms and signs involving the circulatory and respiratory systems: Secondary | ICD-10-CM

## 2020-12-28 MED ORDER — VITAMIN D3 25 MCG (1000 UT) PO CAPS: 1.0000 | ORAL_CAPSULE | Freq: Every day | ORAL | Status: DC

## 2020-12-28 MED ORDER — FLUTICASONE PROPIONATE 50 MCG/ACT NA SUSP: 2.0000 | Freq: Every day | NASAL | 3 refills | Status: DC

## 2020-12-28 MED ORDER — LEVOTHYROXINE SODIUM 50 MCG PO TABS: 50.0000 ug | ORAL_TABLET | Freq: Every day | ORAL | 3 refills | Status: DC

## 2020-12-28 NOTE — Assessment & Plan Note (Signed)
Continue monthly B12 injections

## 2020-12-28 NOTE — Assessment & Plan Note (Signed)
Stable labs

## 2020-12-28 NOTE — Assessment & Plan Note (Signed)
Has not been taking vit D - rec start 1000 IU daily.

## 2020-12-28 NOTE — Progress Notes (Signed)
Patient ID: Sierra Kaiser, female    DOB: 1934/02/06, 87 y.o.   MRN: [id number]  This visit was conducted in person.  BP 124/64   Pulse 83   Temp 98.3 F (36.8 C) (Temporal)   Ht 5' 6.25" (1.683 m)   Wt 129 lb (58.5 kg)   SpO2 97%   BMI 20.66 kg/m    CC: 3 mo f/u visit  Subjective:   HPI: Sierra Kaiser is a 87 y.o. female presenting on 12/28/2020 for Follow-up (3 mo follow up /)   Presents alone today. Husband drove her here today. They were able to find the office ok.   Still has not scheduled DEXA or mammogram. Not interested. Discussed reasons behind recommendations - she agrees to reschedule. She is not taking vitamin D.   Saw Dr Manuella Ghazi neurology 05/2020 - dx MCI started on donepezil. 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  Continues monthly B12 shots, latest 12/07/2020.   Thyroid function remains low - will increase levothyroxine to 33mcg daily. Notes ongoing fatigue. No weight changes.    She has been taking afrin nasal spray daily since she ran out of flonase. Advised to stop this, may take flonase in its place.      Relevant past medical, surgical, family and social history reviewed and updated as indicated. Interim medical history since our last visit reviewed. Allergies and medications reviewed and updated. Outpatient Medications Prior to Visit  Medication Sig Dispense Refill   cyanocobalamin (,VITAMIN B-12,) 1000 MCG/ML injection Inject 1 mL (1,000 mcg total) into the muscle every 30 (thirty) days.     donepezil (ARICEPT) 5 MG tablet Take 5 mg by mouth at bedtime.     escitalopram (LEXAPRO) 10 MG tablet Take 10 mg by mouth daily.     ezetimibe (ZETIA) 10 MG tablet TAKE 1 TABLET BY MOUTH EVERY DAY 90 tablet 0   furosemide (LASIX) 20 MG tablet TAKE 1 TABLET (20 MG TOTAL) BY MOUTH AS NEEDED FOR FLUID OR EDEMA. 30 tablet 3   Polyethyl Glycol-Propyl Glycol (SYSTANE OP) Place 1 drop into both eyes daily as needed (dry eyes).      rosuvastatin (CRESTOR) 20 MG tablet Take 1 tablet (20 mg total) by mouth every other day. 45 tablet 3   triamcinolone (KENALOG) 0.025 % ointment Apply 1 application topically 2 (two) times daily.      XARELTO 15 MG TABS tablet TAKE 1 TABLET BY MOUTH DAILY WITH SUPPER 90 tablet 1   famotidine (PEPCID) 20 MG tablet Take 1 tablet (20 mg total) by mouth at bedtime as needed for heartburn or indigestion.     fluticasone (FLONASE) 50 MCG/ACT nasal spray SPRAY 2 SPRAYS INTO EACH NOSTRIL EVERY DAY 16 g 3   levothyroxine (SYNTHROID) 25 MCG tablet Take 1 tablet (25 mcg total) by mouth daily. 30 tablet 3   Facility-Administered Medications Prior to Visit  Medication Dose Route Frequency Provider Last Rate Last Admin   cyanocobalamin ((VITAMIN B-12)) injection 1,000 mcg  1,000 mcg Intramuscular Once [name], MD         Per HPI unless specifically indicated in ROS section below Review of Systems  Objective:  BP 124/64   Pulse 83   Temp 98.3 F (36.8 C) (Temporal)   Ht 5' 6.25" (1.683 m)   Wt 129 lb (58.5 kg)   SpO2 97%   BMI 20.66 kg/m   Wt Readings from Last 3 Encounters:  12/28/20 129 lb (58.5 kg)  11/30/20 130 lb (59 kg)  11/02/20 130 lb 2 oz (59 kg)      Physical Exam Vitals and nursing note reviewed.  Constitutional:      Appearance: Normal appearance. She is not ill-appearing.  Cardiovascular:     Rate and Rhythm: Normal rate and regular rhythm.     Pulses: Normal pulses.     Heart sounds: Normal heart sounds. No murmur heard. Pulmonary:     Effort: Pulmonary effort is normal. No respiratory distress.     Breath sounds: Normal breath sounds. No wheezing, rhonchi or rales.  Musculoskeletal:     Right lower leg: No edema.     Left lower leg: No edema.  Skin:    General: Skin is warm and dry.  Neurological:     Mental Status: She is alert.  Psychiatric:        Mood and Affect: Mood normal.        Behavior: Behavior normal.      Results for orders placed or  performed in visit on 12/21/20  C-reactive protein  Result Value Ref Range   CRP <1.0 0.5 - 20.0 mg/dL  Sedimentation rate  Result Value Ref Range   Sed Rate 6 0 - 30 mm/hr  ANA  Result Value Ref Range   Anti Nuclear Antibody (ANA) POSITIVE (A) NEGATIVE  Intrinsic Factor Antibodies  Result Value Ref Range   Intrinsic Factor Negative Negative  Renal function panel  Result Value Ref Range   Sodium 146 (H) 135 - 145 mEq/L   Potassium 4.3 3.5 - 5.1 mEq/L   Chloride 109 96 - 112 mEq/L   CO2 24 19 - 32 mEq/L   Albumin 4.3 3.5 - 5.2 g/dL   BUN 25 (H) 6 - 23 mg/dL   Creatinine, Ser 1.10 0.40 - 1.20 mg/dL   Glucose, Bld 67 (L) 70 - 99 mg/dL   Phosphorus 3.5 2.3 - 4.6 mg/dL   GFR 45.77 (L) >60.00 mL/min   Calcium 9.6 8.4 - 10.5 mg/dL  T4, free  Result Value Ref Range   Free T4 0.58 (L) 0.60 - 1.60 ng/dL  TSH  Result Value Ref Range   TSH 5.34 0.35 - 5.50 uIU/mL  Vitamin B12  Result Value Ref Range   Vitamin B-12 324 211 - 911 pg/mL  Anti-nuclear ab-titer (ANA titer)  Result Value Ref Range   ANA Titer 1 1:320 (H) titer   ANA Pattern 1 Nuclear, Homogeneous (A)     Assessment & Plan:  This visit occurred during the SARS-CoV-2 public health emergency.  Safety protocols were in place, including screening questions prior to the visit, additional usage of staff PPE, and extensive cleaning of exam room while observing appropriate contact time as indicated for disinfecting solutions.   Problem List Items Addressed This Visit     CKD (chronic kidney disease) stage 3, GFR 30-59 ml/min (HCC)    Stable labs.       Low serum vitamin B12    Continue monthly B12 injections.       Anxiety    rec continue lexapro 10mg  daily through neurology.       Subclinical hypothyroidism    Levels improving. Increase levothyroxine to 14mcg daily, monitor heat intolerance/diarrhea.       Relevant Medications   levothyroxine (SYNTHROID) 50 MCG tablet   Vitamin D deficiency    Has not been  taking vit D - rec start 1000 IU daily.       Positive ANA (antinuclear  antibody)    ANA remains elevated. Prior lupus workup returned negative. Pt denies rash, joint pains, fatigue. Will continue to monitor.       Mild cognitive impairment with memory loss    Continues aricept followed by Dr Manuella Ghazi neurology       Chest congestion    Notes ongoing clear mucous worse after meals.  She was previously on flonase, then transitioned to oxymetazoline (afrin) nasal decongestant regular use. Advised stop this, will restart flonase to facilitate discontinuation and hopefully minimize rebound congestion coming off afrin.       RESOLVED: GERD (gastroesophageal reflux disease)    Today denies any GERD symptoms of hearburn, indigestion, reflux. Will stop PPI and pepcid use.         Meds ordered this encounter  Medications   levothyroxine (SYNTHROID) 50 MCG tablet    Sig: Take 1 tablet (50 mcg total) by mouth daily.    Dispense:  90 tablet    Refill:  3   fluticasone (FLONASE) 50 MCG/ACT nasal spray    Sig: Place 2 sprays into both nostrils daily.    Dispense:  16 g    Refill:  3   Cholecalciferol (VITAMIN D3) 25 MCG (1000 UT) CAPS    Sig: Take 1 capsule (1,000 Units total) by mouth daily.    Dispense:  30 capsule   No orders of the defined types were placed in this encounter.    Patient Instructions  Stop oxymetazoline (afrin) nasal decongestant spray - it may cause rebound congestion but try to bear through this.  In its place restart flonase nasal steroid.  Increase levothyroxine 43mcg daily - new dose is at pharmacy.  Stay off pepcid and omeprazole as you're not having any heartburn or indigestion or reflux.  Return in 3 months for follow up visit.   Follow up plan: Return in about 3 months (around 03/30/2021), or if symptoms worsen or fail to improve, for follow up visit.  [name], MD

## 2020-12-28 NOTE — Assessment & Plan Note (Signed)
Continues aricept followed by Dr Manuella Ghazi neurology

## 2020-12-28 NOTE — Assessment & Plan Note (Signed)
ANA remains elevated. Prior lupus workup returned negative. Pt denies rash, joint pains, fatigue. Will continue to monitor.

## 2020-12-28 NOTE — Assessment & Plan Note (Signed)
Levels improving. Increase levothyroxine to 32mcg daily, monitor heat intolerance/diarrhea.

## 2020-12-28 NOTE — Assessment & Plan Note (Signed)
Today denies any GERD symptoms of hearburn, indigestion, reflux. Will stop PPI and pepcid use.

## 2020-12-28 NOTE — Patient Instructions (Addendum)
Stop oxymetazoline (afrin) nasal decongestant spray - it may cause rebound congestion but try to bear through this.  In its place restart flonase nasal steroid.  Increase levothyroxine 31mcg daily - new dose is at pharmacy.  Stay off pepcid and omeprazole as you're not having any heartburn or indigestion or reflux.  Return in 3 months for follow up visit.

## 2020-12-28 NOTE — Assessment & Plan Note (Signed)
Notes ongoing clear mucous worse after meals.  She was previously on flonase, then transitioned to oxymetazoline (afrin) nasal decongestant regular use. Advised stop this, will restart flonase to facilitate discontinuation and hopefully minimize rebound congestion coming off afrin.

## 2020-12-28 NOTE — Assessment & Plan Note (Signed)
rec continue lexapro 10mg  daily through neurology.

## 2020-12-30 ENCOUNTER — Other Ambulatory Visit: Payer: Self-pay | Admitting: Family Medicine

## 2020-12-30 DIAGNOSIS — Z1231 Encounter for screening mammogram for malignant neoplasm of breast: Secondary | ICD-10-CM

## 2021-01-11 ENCOUNTER — Other Ambulatory Visit: Payer: Self-pay

## 2021-01-11 ENCOUNTER — Ambulatory Visit (INDEPENDENT_AMBULATORY_CARE_PROVIDER_SITE_OTHER): Payer: Medicare Other

## 2021-01-11 DIAGNOSIS — E538 Deficiency of other specified B group vitamins: Secondary | ICD-10-CM | POA: Diagnosis not present

## 2021-01-11 MED ORDER — CYANOCOBALAMIN 1000 MCG/ML IJ SOLN
1000.0000 ug | Freq: Once | INTRAMUSCULAR | Status: AC
  Administered 2021-01-11: 1000 ug via INTRAMUSCULAR

## 2021-01-11 NOTE — Progress Notes (Signed)
Per orders of Dr. Gutierrez, injection of monthly B12 1000 mcg/ml given by Anis Degidio P Anzlee Hinesley, CMA in Right Deltoid. Patient tolerated injection well.  

## 2021-01-20 ENCOUNTER — Telehealth: Payer: Self-pay | Admitting: Family Medicine

## 2021-01-20 NOTE — Telephone Encounter (Signed)
Pt called in stating that she is going to dentist and want to know how many days should she be off the xarelto before tooth can be pulled

## 2021-01-20 NOTE — Telephone Encounter (Signed)
What exactly is she having done? How many teeth need to be pulled? If just one, don't think needs to hold xarelto. Instead dentist could use tranexamic mouthwash which can help clotting. If dentist recommends holding xarelto, would hold for 1 day for 1-2 teeth. If multiple teeth need extracting, can hold for 2 days prior to procedure.

## 2021-01-21 NOTE — Telephone Encounter (Signed)
Attempted several times to contact pt.  Sounds like someone picks up but no one answers.  Then call disconnects.

## 2021-01-24 NOTE — Telephone Encounter (Signed)
If just 1 tooth, up to oral surgeon, he may not want xarelto held.  Max would hold 1 day xarelto, not 2.  She should check with oral surgeon.

## 2021-01-24 NOTE — Telephone Encounter (Signed)
Spoke with pt relaying Dr. Synthia Innocent message.  Pt verbalizes understanding and will discuss with surgeon.

## 2021-01-24 NOTE — Telephone Encounter (Signed)
Spoke with pt asking Dr. Synthia Innocent questions.  Pt states she's having one tooth pulled due to a broken crown.  The dentist was going to do it but is sending pt to oral surgeon instead.  I relayed Dr. Synthia Innocent message.  Pt verbalizes understanding and states she will hold Xarelto for 2 days prior to procedure just to be safe.

## 2021-01-25 ENCOUNTER — Ambulatory Visit
Admission: RE | Admit: 2021-01-25 | Discharge: 2021-01-25 | Disposition: A | Discharge status: Home / Self Care | Payer: Medicare Other | Source: Ambulatory Visit | Attending: Family Medicine | Admitting: Family Medicine

## 2021-01-25 ENCOUNTER — Other Ambulatory Visit: Payer: Self-pay

## 2021-01-25 DIAGNOSIS — M8588 Other specified disorders of bone density and structure, other site: Secondary | ICD-10-CM | POA: Insufficient documentation

## 2021-01-25 DIAGNOSIS — Z1231 Encounter for screening mammogram for malignant neoplasm of breast: Secondary | ICD-10-CM | POA: Diagnosis present

## 2021-02-01 ENCOUNTER — Encounter: Payer: Self-pay | Admitting: Family Medicine

## 2021-02-01 DIAGNOSIS — M810 Age-related osteoporosis without current pathological fracture: Secondary | ICD-10-CM | POA: Insufficient documentation

## 2021-02-09 ENCOUNTER — Ambulatory Visit (INDEPENDENT_AMBULATORY_CARE_PROVIDER_SITE_OTHER): Payer: Medicare Other

## 2021-02-09 DIAGNOSIS — I428 Other cardiomyopathies: Secondary | ICD-10-CM | POA: Diagnosis not present

## 2021-02-09 LAB — CUP PACEART REMOTE DEVICE CHECK
Battery Remaining Longevity: 62 mo
Battery Remaining Percentage: 46 %
Battery Voltage: 2.96 V
Brady Statistic RV Percent Paced: 98 %
Date Time Interrogation Session: 20221207020024
Implantable Lead Implant Date: 20160217
Implantable Lead Location: 753860
Implantable Lead Model: 1948
Implantable Pulse Generator Implant Date: 20160217
Lead Channel Impedance Value: 550 Ohm
Lead Channel Pacing Threshold Amplitude: 0.625 V
Lead Channel Pacing Threshold Pulse Width: 0.4 ms
Lead Channel Sensing Intrinsic Amplitude: 7.2 mV
Lead Channel Setting Pacing Amplitude: 0.875
Lead Channel Setting Pacing Pulse Width: 0.4 ms
Lead Channel Setting Sensing Sensitivity: 2.5 mV
Pulse Gen Model: 2240
Pulse Gen Serial Number: 3050195

## 2021-02-10 ENCOUNTER — Other Ambulatory Visit: Payer: Self-pay

## 2021-02-10 ENCOUNTER — Ambulatory Visit (INDEPENDENT_AMBULATORY_CARE_PROVIDER_SITE_OTHER): Payer: Medicare Other

## 2021-02-10 DIAGNOSIS — E538 Deficiency of other specified B group vitamins: Secondary | ICD-10-CM | POA: Diagnosis not present

## 2021-02-10 MED ORDER — CYANOCOBALAMIN 1000 MCG/ML IJ SOLN
1000.0000 ug | Freq: Once | INTRAMUSCULAR | Status: AC
  Administered 2021-02-10: 1000 ug via INTRAMUSCULAR

## 2021-02-10 NOTE — Progress Notes (Signed)
Per orders of Dr. Danise Mina, injection of monthly B12 1000 mcg/ml injection given by Pilar Grammes, CMA in Left Deltoid. Patient tolerated injection well.  Sending to Dr Diona Browner in Dr Gutierrez's absence.

## 2021-02-16 ENCOUNTER — Other Ambulatory Visit: Payer: Self-pay | Admitting: Physician Assistant

## 2021-02-18 NOTE — Progress Notes (Signed)
Remote pacemaker transmission.   

## 2021-03-08 ENCOUNTER — Telehealth: Payer: Self-pay | Admitting: Cardiovascular Disease

## 2021-03-08 NOTE — Telephone Encounter (Signed)
° °  Pre-operative Risk Assessment    Patient Name: Sierra Kaiser  DOB: 1933-11-01 MRN: [id number]     Request for Surgical Clearance    Procedure:  Dental Extraction - Amount of Teeth to be Pulled:  1  Date of Surgery:  Clearance 03/09/21                                 Surgeon:  Dr. Angelica Pou Group or Practice Name:  [facility]  Phone number:  [phone] Fax number:  [phone]   Type of Clearance Requested:   - Pharmacy:  Hold Rivaroxaban (Xarelto) please advise    Type of Anesthesia:  Local    Additional requests/questions:    Jonathon Jordan   03/08/2021, 4:28 PM

## 2021-03-08 NOTE — Telephone Encounter (Signed)
° °  Patient Name: Sierra Kaiser  DOB: 08-14-1933 MRN: [id number]  Primary Cardiologist: Virl Axe, MD  Chart reviewed as part of pre-operative protocol coverage.   Simple dental extractions are considered low risk procedures per guidelines and generally do not require any specific cardiac clearance. It is also generally accepted that for simple extractions and dental cleanings, there is no need to interrupt blood thinner therapy.  SBE prophylaxis is not required for the patient from a cardiac standpoint.  I will route this recommendation to the requesting party via Epic fax function and remove from pre-op pool.  Please call with questions.  [place], [place] 03/08/2021, 9:59 PM

## 2021-03-15 ENCOUNTER — Ambulatory Visit (INDEPENDENT_AMBULATORY_CARE_PROVIDER_SITE_OTHER): Payer: Medicare Other

## 2021-03-15 ENCOUNTER — Other Ambulatory Visit: Payer: Self-pay

## 2021-03-15 DIAGNOSIS — E538 Deficiency of other specified B group vitamins: Secondary | ICD-10-CM | POA: Diagnosis not present

## 2021-03-15 MED ORDER — CYANOCOBALAMIN 1000 MCG/ML IJ SOLN
1000.0000 ug | Freq: Once | INTRAMUSCULAR | Status: AC
  Administered 2021-03-15: 1000 ug via INTRAMUSCULAR

## 2021-03-15 NOTE — Progress Notes (Signed)
Per orders of Dr. Danise Mina, injection of monthly B12 1000 mcg/ml given by Pilar Grammes, CMA in right Deltoid. Patient tolerated injection well.

## 2021-03-30 ENCOUNTER — Encounter: Payer: Self-pay | Admitting: Family Medicine

## 2021-03-30 ENCOUNTER — Other Ambulatory Visit: Payer: Self-pay

## 2021-03-30 ENCOUNTER — Ambulatory Visit (INDEPENDENT_AMBULATORY_CARE_PROVIDER_SITE_OTHER): Payer: Medicare Other | Admitting: Family Medicine

## 2021-03-30 VITALS — BP 118/62 | HR 71 | Temp 97.5°F | Ht 66.25 in | Wt 130.6 lb

## 2021-03-30 DIAGNOSIS — I7300 Raynaud's syndrome without gangrene: Secondary | ICD-10-CM

## 2021-03-30 DIAGNOSIS — Z7901 Long term (current) use of anticoagulants: Secondary | ICD-10-CM | POA: Diagnosis not present

## 2021-03-30 DIAGNOSIS — E038 Other specified hypothyroidism: Secondary | ICD-10-CM | POA: Diagnosis not present

## 2021-03-30 DIAGNOSIS — R768 Other specified abnormal immunological findings in serum: Secondary | ICD-10-CM

## 2021-03-30 DIAGNOSIS — R197 Diarrhea, unspecified: Secondary | ICD-10-CM

## 2021-03-30 DIAGNOSIS — R0609 Other forms of dyspnea: Secondary | ICD-10-CM

## 2021-03-30 DIAGNOSIS — G3184 Mild cognitive impairment, so stated: Secondary | ICD-10-CM

## 2021-03-30 DIAGNOSIS — N1832 Chronic kidney disease, stage 3b: Secondary | ICD-10-CM | POA: Diagnosis not present

## 2021-03-30 DIAGNOSIS — M810 Age-related osteoporosis without current pathological fracture: Secondary | ICD-10-CM

## 2021-03-30 DIAGNOSIS — E538 Deficiency of other specified B group vitamins: Secondary | ICD-10-CM

## 2021-03-30 LAB — T3: T3, Total: 104 ng/dL (ref 76–181)

## 2021-03-30 NOTE — Progress Notes (Signed)
Patient ID: Sierra Kaiser, female    DOB: 1933-03-18, 88 y.o.   MRN: [id number]  This visit was conducted in person.  BP 118/62    Pulse 71    Temp (!) 97.5 F (36.4 C) (Temporal)    Ht 5' 6.25" (1.683 m)    Wt 130 lb 9 oz (59.2 kg)    SpO2 95%    BMI 20.91 kg/m    CC:  3 mo f/u visit  Subjective:   HPI: Sierra Kaiser is a 88 y.o. female presenting on 03/30/2021 for Follow-up (Here for 3 mo f/u. )   MCI diagnosed by Dr Manuella Ghazi neurology 05/2020 now on donepezil 5mg  daily.   Hypothyroidism - thyroid dose increased last visit to 16mcg daily - due for rpt labs.   Continues monthly B12 shots - last dose 03/15/2021.   Over the past few months, noticing looser stools associated with stool urgency and more difficulty controlling movements. She has been managing with increasing yogurt and drinking energy drinks. No blood in stool, fevers/chills, abd pain. Having 3 BMs per day. The past 2 days having small amt formed stools. No diet changes.   Denies fevers/chills, joint pains, skin rashes, red eyes, oral lesions, dry skin or other skin changes, no dry eye sensation.  She notes intermittent pruritis and ongoing dry mouth.  Notes ongoing numbness to fingers and feet stay cold - attributes this to h/o raynaud's syndrome.      Relevant past medical, surgical, family and social history reviewed and updated as indicated. Interim medical history since our last visit reviewed. Allergies and medications reviewed and updated. Outpatient Medications Prior to Visit  Medication Sig Dispense Refill   cyanocobalamin (,VITAMIN B-12,) 1000 MCG/ML injection Inject 1 mL (1,000 mcg total) into the muscle every 30 (thirty) days.     donepezil (ARICEPT) 5 MG tablet Take 5 mg by mouth at bedtime.     escitalopram (LEXAPRO) 10 MG tablet Take 10 mg by mouth daily.     ezetimibe (ZETIA) 10 MG tablet TAKE 1 TABLET BY MOUTH EVERY DAY 90 tablet 0   fluticasone (FLONASE) 50 MCG/ACT nasal spray Place 2 sprays into both  nostrils daily. 16 g 3   furosemide (LASIX) 20 MG tablet TAKE 1 TABLET (20 MG TOTAL) BY MOUTH AS NEEDED FOR FLUID OR EDEMA. 30 tablet 3   levothyroxine (SYNTHROID) 50 MCG tablet Take 1 tablet (50 mcg total) by mouth daily. 90 tablet 3   Polyethyl Glycol-Propyl Glycol (SYSTANE OP) Place 1 drop into both eyes daily as needed (dry eyes).     rosuvastatin (CRESTOR) 20 MG tablet Take 1 tablet (20 mg total) by mouth every other day. 45 tablet 3   triamcinolone (KENALOG) 0.025 % ointment Apply 1 application topically 2 (two) times daily.      XARELTO 15 MG TABS tablet TAKE 1 TABLET BY MOUTH DAILY WITH SUPPER 90 tablet 1   Cholecalciferol (VITAMIN D3) 25 MCG (1000 UT) CAPS Take 1 capsule (1,000 Units total) by mouth daily. 30 capsule    Facility-Administered Medications Prior to Visit  Medication Dose Route Frequency Provider Last Rate Last Admin   cyanocobalamin ((VITAMIN B-12)) injection 1,000 mcg  1,000 mcg Intramuscular Once [name], MD         Per HPI unless specifically indicated in ROS section below Review of Systems  Objective:  BP 118/62    Pulse 71    Temp (!) 97.5 F (36.4 C) (Temporal)    Ht  5' 6.25" (1.683 m)    Wt 130 lb 9 oz (59.2 kg)    SpO2 95%    BMI 20.91 kg/m   Wt Readings from Last 3 Encounters:  03/30/21 130 lb 9 oz (59.2 kg)  12/28/20 129 lb (58.5 kg)  11/30/20 130 lb (59 kg)      Physical Exam Vitals and nursing note reviewed.  Constitutional:      Appearance: Normal appearance. She is not ill-appearing.  Cardiovascular:     Rate and Rhythm: Normal rate and regular rhythm.     Pulses: Normal pulses.     Heart sounds: Normal heart sounds. No murmur heard. Pulmonary:     Effort: Pulmonary effort is normal. No respiratory distress.     Breath sounds: Normal breath sounds. No wheezing, rhonchi or rales.  Musculoskeletal:     Right lower leg: No edema.     Left lower leg: No edema.  Skin:    Findings: No rash.  Neurological:     Mental Status: She is  alert.  Psychiatric:        Mood and Affect: Mood normal.        Behavior: Behavior normal.       Results for orders placed or performed in visit on 03/30/21  T3  Result Value Ref Range   T3, Total 104 76 - 181 ng/dL    Assessment & Plan:  This visit occurred during the SARS-CoV-2 public health emergency.  Safety protocols were in place, including screening questions prior to the visit, additional usage of staff PPE, and extensive cleaning of exam room while observing appropriate contact time as indicated for disinfecting solutions.   Problem List Items Addressed This Visit     Chronic anticoagulation    She continues xarelto 15mg  daily in h/o atrial fibrillation s/p ablation      Chronic dyspnea    Thought multifactorial including chronic dCHF Will need updated CXR next visit.       CKD (chronic kidney disease) stage 3, GFR 30-59 ml/min (HCC) - Primary    Update labs today.       Relevant Orders   Renal function panel   Osteoporosis    DEXA completed 01/2021 - will need to review treatment options.       Low serum vitamin B12    Continues monthly B12 shots, latest 03/15/2021 Update B12 levels today.       Relevant Orders   Vitamin B12   Subclinical hypothyroidism    Update thyroid levels on levothyroxine 20mcg daily.       Relevant Orders   T4, free   TSH   T3 (Completed)   Positive ANA (antinuclear antibody)    H/o this, testing for lupus returned normal (complements, anti-dsDNA, anti-RNP). Denies fevers/chills, joint pains, skin rashes, red eyes, oral lesions, dry skin or other skin changes, no dry eye sensation. She notes intermittent pruritis and ongoing dry mouth. Will continue to monitor, consider rheumatology evaluation if progressive or new symptoms develop       Raynaud's disease    Chronic       Mild cognitive impairment with memory loss    Sees neurology on aricept 5mg  daily.       Diarrhea    Noted over the past several months, may have  started after increasing levothyroxine dose - will await TFTs ordered today. Prior constipation - ?IBS-mixed.         No orders of the defined types were placed in this  encounter.  Orders Placed This Encounter  Procedures   Renal function panel   T4, free   TSH   T3   Vitamin B12     Patient Instructions  Labs today  Continue current medicines.  We will be in touch with results.  Return in 3-4 months for follow up visit   Follow up plan: Return in about 3 months (around 06/28/2021) for follow up visit.  [name], MD

## 2021-03-30 NOTE — Patient Instructions (Addendum)
Labs today  Continue current medicines.  We will be in touch with results.  Return in 3-4 months for follow up visit

## 2021-03-31 DIAGNOSIS — R197 Diarrhea, unspecified: Secondary | ICD-10-CM | POA: Insufficient documentation

## 2021-03-31 LAB — RENAL FUNCTION PANEL
Albumin: 4.5 g/dL (ref 3.5–5.2)
BUN: 22 mg/dL (ref 6–23)
CO2: 28 mEq/L (ref 19–32)
Calcium: 10 mg/dL (ref 8.4–10.5)
Chloride: 107 mEq/L (ref 96–112)
Creatinine, Ser: 1.17 mg/dL (ref 0.40–1.20)
GFR: 42.42 mL/min — ABNORMAL LOW (ref >60.00)
Glucose, Bld: 76 mg/dL (ref 70–99)
Phosphorus: 3.7 mg/dL (ref 2.3–4.6)
Potassium: 5.2 mEq/L — ABNORMAL HIGH (ref 3.5–5.1)
Sodium: 142 mEq/L (ref 135–145)

## 2021-03-31 LAB — T4, FREE: Free T4: 0.72 ng/dL (ref 0.60–1.60)

## 2021-03-31 LAB — TSH: TSH: 2.45 u[IU]/mL (ref 0.35–5.50)

## 2021-03-31 LAB — VITAMIN B12: Vitamin B-12: 430 pg/mL (ref 211–911)

## 2021-03-31 NOTE — Assessment & Plan Note (Signed)
Update thyroid levels on levothyroxine 22mcg daily.

## 2021-03-31 NOTE — Assessment & Plan Note (Signed)
Continues monthly B12 shots, latest 03/15/2021 Update B12 levels today.

## 2021-03-31 NOTE — Assessment & Plan Note (Signed)
Noted over the past several months, may have started after increasing levothyroxine dose - will await TFTs ordered today. Prior constipation - ?IBS-mixed.

## 2021-03-31 NOTE — Assessment & Plan Note (Signed)
Update labs today 

## 2021-03-31 NOTE — Assessment & Plan Note (Signed)
She continues xarelto 15mg  daily in h/o atrial fibrillation s/p ablation

## 2021-03-31 NOTE — Assessment & Plan Note (Signed)
DEXA completed 01/2021 - will need to review treatment options.

## 2021-03-31 NOTE — Assessment & Plan Note (Signed)
Thought multifactorial including chronic dCHF Will need updated CXR next visit.

## 2021-03-31 NOTE — Assessment & Plan Note (Signed)
H/o this, testing for lupus returned normal (complements, anti-dsDNA, anti-RNP). Denies fevers/chills, joint pains, skin rashes, red eyes, oral lesions, dry skin or other skin changes, no dry eye sensation. She notes intermittent pruritis and ongoing dry mouth. Will continue to monitor, consider rheumatology evaluation if progressive or new symptoms develop

## 2021-03-31 NOTE — Assessment & Plan Note (Signed)
Sees neurology on aricept 5mg  daily.

## 2021-03-31 NOTE — Assessment & Plan Note (Signed)
Chronic. 

## 2021-04-08 ENCOUNTER — Telehealth: Payer: Self-pay | Admitting: Cardiovascular Disease

## 2021-04-08 NOTE — Telephone Encounter (Signed)
Patient dropped off parking placement form to be filled out, placed in box

## 2021-04-12 NOTE — Telephone Encounter (Signed)
Scheduled

## 2021-04-13 ENCOUNTER — Other Ambulatory Visit: Payer: Self-pay

## 2021-04-13 ENCOUNTER — Encounter: Payer: Self-pay | Admitting: Cardiovascular Disease

## 2021-04-13 ENCOUNTER — Ambulatory Visit (INDEPENDENT_AMBULATORY_CARE_PROVIDER_SITE_OTHER): Payer: Medicare Other | Admitting: Cardiovascular Disease

## 2021-04-13 VITALS — BP 150/60 | HR 70 | Ht 69.0 in | Wt 130.0 lb

## 2021-04-13 DIAGNOSIS — Z950 Presence of cardiac pacemaker: Secondary | ICD-10-CM

## 2021-04-13 DIAGNOSIS — I428 Other cardiomyopathies: Secondary | ICD-10-CM | POA: Diagnosis not present

## 2021-04-13 DIAGNOSIS — I442 Atrioventricular block, complete: Secondary | ICD-10-CM

## 2021-04-13 DIAGNOSIS — I495 Sick sinus syndrome: Secondary | ICD-10-CM

## 2021-04-13 DIAGNOSIS — I4821 Permanent atrial fibrillation: Secondary | ICD-10-CM

## 2021-04-13 DIAGNOSIS — I5032 Chronic diastolic (congestive) heart failure: Secondary | ICD-10-CM

## 2021-04-13 NOTE — Progress Notes (Signed)
Date:  04/13/2021   ID:  Sierra Kaiser, DOB 11-Sep-1933, MRN [id number]  Patient Location:  [place] [id number]   Provider location:   [facility], [place] office  PCP:  [name], MD  Cardiologist:  [name] [facility]   Chief Complaint  Patient presents with   Shortness of Breath    Patient c/o dizziness and shortness of breath. Medications reviewed by the patient verbally.     History of Present Illness:    Sierra Kaiser is a 88 y.o. female  past medical history of atrial fibrillation, previously followed at [facility],  long history of atrial fibrillation, trial on numerous medications.  Tikosyn  Started 2012.Multaq.    atrial fibrillation ablation in October 2014. syncope in 2013, March 2015. Possibly from low blood pressure Chronic renal insufficiency AV node ablation, now with pacemaker February 2016 Aortic atherosclerosis, moderate on CT scan 09/2015 Anxiety/stress Mild to moderate mitral valve regurgitation Chronic SOB, "had it for 15 years" She presents for routine follow-up of her atrial fibrillation, paced rhythm, chronic dizziness  Last seen in clinic February 2022 In follow-up today reports doing well, Weight down 10 pounds in one year Orthostatics positive today 932 systolic to 671 with standing Not on lasix Not on Bp meds Feels she is hydrated.  Energy drink, coffee, coke, water a day  Active at baseline with no regular exercise program: "bird feeders, flowers, outdoors"  Presents today by herself, Previous concern for taking medications son has moved from [place] to live in the area  problems with her memory, he is helping  hearing loss, Mnire's disease, chronic dizziness  EKG personally reviewed by myself on todays visit Shows underlying atrial fibrillation, paced rhythm rate70   Lab Results  Component Value Date   CHOL 123 10/29/2020   HDL 56.40 10/29/2020   LDLCALC 52  10/29/2020   TRIG 72.0 10/29/2020    Other past medical history reviewed CT scan 09/29/2015 moderate diffuse aortic and iliac artery  Atherosclerosis Unable to exclude distal RCA coronary calcification   Echocardiogram March 2016 showing moderate MR, mildly elevated right heart pressures estimated 40 mmHg   Previously on metoprolol and tikosyn. Changed to amiodarone in preparation for cardioversion She underwent cardioversion 11/12/2013 which was successful but she reports going back to atrial fibrillation the next day    Lab work from [facility] 09/28/2012 showing creatinine 1.55, BUN 24 BNP 4800 EKG 09/28/2012 showing atrial fibrillation with RVR, rate 137 beats per minute followup EKG showing normal sinus rhythm with rate 82 beats per minute    Echocardiogram January 2011 was essentially normal. Repeat echocardiogram September 2014 again with normal LV function, normal study   Cardioversion may 2012 Cardioversion 12/06/2012 cardiac cath 05/17/2007 showing no significant CAD   Past Medical History:  Diagnosis Date   Anxiety    CAD (coronary artery disease)    CHF (congestive heart failure) ([place])    Esophageal reflux    GI bleeding 2012   during EGD necessitating open surgery   Hiatal hernia    History of blood transfusion    History of diverticulitis    History of UTI    HTN (hypertension)    Hyperlipidemia    Meniere's disease 1970s   Mitral regurgitation    Osteopenia 12/11/2015   T score -1.2 femur, -2.0 spine (12/2015)   Permanent atrial fibrillation ([place])    a. permanent b. s/p PVI RFA at [facility] 10/14 c.  failed amio (neuro toxicity) and Tikosyn d. 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    PUD (peptic ulcer disease)    Raynaud's disease    SNHL (sensorineural hearing loss)    right ear   Syncopal episodes    Tinnitus    Tinnitus    Ulcerative colitis ([place])    per prior records   Urinary incontinence, urge    Past Surgical History:   Procedure Laterality Date   APPENDECTOMY  1973   AV NODE ABLATION N/A 05/06/2014   Procedure: AV NODE ABLATION;  Surgeon: [name], MD;  Location: [facility] C[facility] LAB;  Service: Cardiovascular;  Laterality: N/A;   CARDIAC CATHETERIZATION  2014   [facility]   CARDIAC ELECTROPHYSIOLOGY STUDY AND ABLATION     CHOLECYSTECTOMY  2010   COLONOSCOPY  2010   polyps   COLONOSCOPY WITH PROPOFOL N/A 08/29/2017   TAs, no f/u recommended Alice Reichert, Benay Pike, MD)   ESOPHAGOGASTRODUODENOSCOPY (EGD) WITH PROPOFOL N/A 08/29/2017   benign biopsies Alice Reichert, Benay Pike, MD)   [place]  2012   EGD biopsy led to bleeding - needed laparotomy to stop bleed (Dr Jimmye Norman at [facility])   [place] N/A 04/22/2014   STJ single chamber pacemaker implanted by Dr [name]   TRANSESOPHAGEAL ECHOCARDIOGRAM WITH CARDIOVERSION  2014   [facility]   UPPER GI ENDOSCOPY  2012   with polypectomy   VAGINAL HYSTERECTOMY  1973   elective; ovaries remained      Allergies:   Gadolinium derivatives, Contrast media [iodinated contrast media], Gabapentin, Metronidazole, Other, Sertraline, and Diltiazem hcl   Social History   Tobacco Use   Smoking status: Never   Smokeless tobacco: Never  Vaping Use   Vaping Use: Never used  Substance Use Topics   Alcohol use: Yes    Alcohol/week: 1.0 standard drink    Types: 1 Glasses of wine per week    Comment: 1-2 glasses wine a month   Drug use: Never     Current Outpatient Medications on File Prior to Visit  Medication Sig Dispense Refill   cyanocobalamin (,VITAMIN B-12,) 1000 MCG/ML injection Inject 1 mL (1,000 mcg total) into the muscle every 30 (thirty) days.     donepezil (ARICEPT) 5 MG tablet Take 5 mg by mouth at bedtime.     escitalopram (LEXAPRO) 10 MG tablet Take 10 mg by mouth daily.     ezetimibe (ZETIA) 10 MG tablet TAKE 1 TABLET BY MOUTH EVERY DAY 90 tablet 0   fluticasone (FLONASE) 50 MCG/ACT nasal spray Place 2 sprays into  both nostrils daily. 16 g 3   furosemide (LASIX) 20 MG tablet TAKE 1 TABLET (20 MG TOTAL) BY MOUTH AS NEEDED FOR FLUID OR EDEMA. 30 tablet 3   levothyroxine (SYNTHROID) 50 MCG tablet Take 1 tablet (50 mcg total) by mouth daily. 90 tablet 3   Polyethyl Glycol-Propyl Glycol (SYSTANE OP) Place 1 drop into both eyes daily as needed (dry eyes).     rosuvastatin (CRESTOR) 20 MG tablet Take 1 tablet (20 mg total) by mouth every other day. 45 tablet 3   triamcinolone (KENALOG) 0.025 % ointment Apply 1 application topically 2 (two) times daily.      XARELTO 15 MG TABS tablet TAKE 1 TABLET BY MOUTH DAILY WITH SUPPER 90 tablet 1   Current Facility-Administered Medications on File Prior to Visit  Medication Dose Route Frequency Provider Last Rate Last Admin   cyanocobalamin ((  VITAMIN B-12)) injection 1,000 mcg  1,000 mcg Intramuscular Once [name], MD         Family Hx: The patient's family history includes Alcohol abuse in her brother; Arrhythmia in her mother; Breast cancer (age of onset: 81) in her sister; Cancer in her brother; Diabetes in her mother and son; Heart failure in her brother and brother; Hypertension in her mother; Stroke in her brother and sister.  ROS:   Please see the history of present illness.    Review of Systems  Constitutional:  Positive for weight loss.  HENT: Negative.    Respiratory: Negative.    Cardiovascular: Negative.   Gastrointestinal: Negative.   Musculoskeletal: Negative.   Neurological:  Positive for dizziness.  Psychiatric/Behavioral: Negative.    All other systems reviewed and are negative.   Labs/Other Tests and Data Reviewed:    Recent Labs: 07/23/2020: Hemoglobin 14.2; Platelets 204.0 10/29/2020: ALT 9 03/30/2021: BUN 22; Creatinine, Ser 1.17; Potassium 5.2 No hemolysis seen; Sodium 142; TSH 2.45   Recent Lipid Panel Lab Results  Component Value Date/Time   CHOL 123 10/29/2020 10:59 AM   CHOL 159 08/15/2016 11:14 AM   TRIG 72.0 10/29/2020  10:59 AM   HDL 56.40 10/29/2020 10:59 AM   HDL 57 08/15/2016 11:14 AM   CHOLHDL 2 10/29/2020 10:59 AM   LDLCALC 52 10/29/2020 10:59 AM   LDLCALC 73 08/15/2016 11:14 AM    Wt Readings from Last 3 Encounters:  04/13/21 130 lb (59 kg)  03/30/21 130 lb 9 oz (59.2 kg)  12/28/20 129 lb (58.5 kg)     Exam:    BP (!) 150/60 (BP Location: Left Arm, Patient Position: Sitting, Cuff Size: Normal)    Pulse 70    Ht 5\' 9"  (1.753 m)    Wt 130 lb (59 kg)    SpO2 99%    BMI 19.20 kg/m  Constitutional:  oriented to person, place, and time. No distress.  HENT:  Head: Grossly normal Eyes:  no discharge. No scleral icterus.  Neck: No JVD, no carotid bruits  Cardiovascular: Regular rate and rhythm, no murmurs appreciated Pulmonary/Chest: Clear to auscultation bilaterally, no wheezes or rails Abdominal: Soft.  no distension.  no tenderness.  Musculoskeletal: Normal range of motion Neurological:  normal muscle tone. Coordination normal. No atrophy Skin: Skin warm and dry Psychiatric: normal affect, pleasant   ASSESSMENT & PLAN:    Problem List Items Addressed This Visit       Cardiology Problems   Chronic diastolic CHF (congestive heart failure) (HCC)   Complete heart block (HCC)   Atrial fibrillation, permanent (HCC)   Other Visit Diagnoses     NICM (nonischemic cardiomyopathy) ([place])    -  Primary   Cardiac pacemaker in situ       Sinus node dysfunction (HCC)         SOB, chronic issue Reports symptoms are stable Previously reported shortness of breath symptoms in the setting of Chronic diastolic and systolic CHF/ejection fraction 45 to 50%,  RV paced, deconditioning -Not a active issue on today's visit  Chronic dizziness Longstanding history of dizziness Recommend she stay hydrated particularly in the mornings, mild orthostasis on today's visit Not on blood pressure medications 10 pound weight loss over the past year, likely contributing to drop in pressure  recommend she  stabilize her weight at home.  Needs higher calorie intake Neurocognitive issues could also be contributing to orthostasis  Paced rhythm Followed by  sinus node dysfunction Downloads reviewed with  her  Atrial fib On xarelto No significant gait instability  Hyperlipidemia Continue Crestor every other day with Zetia Total cholesterol 120s   Total encounter time more than 25 minutes  Greater than 50% was spent in counseling and coordination of care with the patient   Signed, Ida Rogue, [place] [facility] [address], [place], [place] [address]

## 2021-04-13 NOTE — Patient Instructions (Signed)
Medication Instructions:  No changes  If you need a refill on your cardiac medications before your next appointment, please call your pharmacy.   Lab work: No new labs needed  Testing/Procedures: No new testing needed  Follow-Up: At [facility], you and your health needs are our priority.  As part of our continuing mission to provide you with exceptional heart care, we have created designated Provider Care Teams.  These Care Teams include your primary Cardiologist (physician) and Advanced Practice Providers (APPs -  Physician Assistants and Nurse Practitioners) who all work together to provide you with the care you need, when you need it.  You will need a follow up appointment in 12 months  Providers on your designated Care Team:   Christopher Berge, NP Ryan Dunn, PA-C Cadence Furth, PA-C  COVID-19 Vaccine Information can be found at: [email or web address] For questions related to vaccine distribution or appointments, please email [email or web address] or call [phone].   

## 2021-04-18 ENCOUNTER — Other Ambulatory Visit: Payer: Self-pay

## 2021-04-18 ENCOUNTER — Ambulatory Visit (INDEPENDENT_AMBULATORY_CARE_PROVIDER_SITE_OTHER): Payer: Medicare Other

## 2021-04-18 DIAGNOSIS — E538 Deficiency of other specified B group vitamins: Secondary | ICD-10-CM | POA: Diagnosis not present

## 2021-04-18 NOTE — Progress Notes (Signed)
After obtaining consent, and per orders of Dr. Danise Mina, injection of b12 given by Layanna Charo  [place]. Patient instructed to report any adverse reaction to me immediately.

## 2021-05-02 NOTE — Progress Notes (Signed)
Subjective:   Sierra Kaiser is a 88 y.o. female who presents for Medicare Annual (Subsequent) preventive examination.  I connected with Sierra Kaiser today by telephone and verified that I am speaking with the correct person using two identifiers. Location patient: home Location provider: work Persons participating in the virtual visit: patient, Marine scientist.    I discussed the limitations, risks, security and privacy concerns of performing an evaluation and management service by telephone and the availability of in person appointments. I also discussed with the patient that there may be a patient responsible charge related to this service. The patient expressed understanding and verbally consented to this telephonic visit.    Interactive audio and video telecommunications were attempted between this provider and patient, however failed, due to patient having technical difficulties OR patient did not have access to video capability.  We continued and completed visit with audio only.  Some vital signs may be absent or patient reported.   Time Spent with patient on telephone encounter: 20 minutes  Review of Systems     Cardiac Risk Factors include: advanced age (>23men, >58 women);dyslipidemia     Objective:    Today's Vitals   05/03/21 1130  Weight: 130 lb (59 kg)  Height: 5\' 9"  (1.753 m)   Body mass index is 19.2 kg/m.  Advanced Directives 05/03/2021 07/05/2020 12/11/2019 10/15/2019 08/29/2017 01/17/2017 10/09/2016  Does Patient Have a Medical Advance Directive? Yes Yes No Yes Yes Yes Yes  Type of Paramedic of [place];Living will Living will - [place];Living will [place];Living will [place];Living will [place];Living will  Does patient want to make changes to medical advance directive? Yes (MAU/Ambulatory/Procedural Areas - Information given) - - - - - No - Patient declined  Copy of  [place] in Chart? - - - No - copy requested - No - copy requested -  Would patient like information on creating a medical advance directive? - No - Patient declined - - - - -    Current Medications (verified) Outpatient Encounter Medications as of 05/03/2021  Medication Sig   cyanocobalamin (,VITAMIN B-12,) 1000 MCG/ML injection Inject 1 mL (1,000 mcg total) into the muscle every 30 (thirty) days.   donepezil (ARICEPT) 5 MG tablet Take 5 mg by mouth daily. Taking in the morning   escitalopram (LEXAPRO) 10 MG tablet Take 10 mg by mouth daily.   ezetimibe (ZETIA) 10 MG tablet TAKE 1 TABLET BY MOUTH EVERY DAY   fluticasone (FLONASE) 50 MCG/ACT nasal spray Place 2 sprays into both nostrils daily.   furosemide (LASIX) 20 MG tablet TAKE 1 TABLET (20 MG TOTAL) BY MOUTH AS NEEDED FOR FLUID OR EDEMA.   levothyroxine (SYNTHROID) 50 MCG tablet Take 1 tablet (50 mcg total) by mouth daily.   Polyethyl Glycol-Propyl Glycol (SYSTANE OP) Place 1 drop into both eyes daily as needed (dry eyes).   rosuvastatin (CRESTOR) 20 MG tablet Take 1 tablet (20 mg total) by mouth every other day.   triamcinolone (KENALOG) 0.025 % ointment Apply 1 application topically 2 (two) times daily.    XARELTO 15 MG TABS tablet TAKE 1 TABLET BY MOUTH DAILY WITH SUPPER   No facility-administered encounter medications on file as of 05/03/2021.    Allergies (verified) Gadolinium derivatives, Contrast media [iodinated contrast media], Gabapentin, Metronidazole, Other, Sertraline, and Diltiazem hcl   History: Past Medical History:  Diagnosis Date   Anxiety    CAD (coronary  artery disease)    CHF (congestive heart failure) (HCC)    Esophageal reflux    GI bleeding 2012   during EGD necessitating open surgery   Hiatal hernia    History of blood transfusion    History of diverticulitis    History of UTI    HTN (hypertension)    Hyperlipidemia    Meniere's disease 1970s   Mitral regurgitation     Osteopenia 12/11/2015   T score -1.2 femur, -2.0 spine (12/2015)   Permanent atrial fibrillation ([place])    a. permanent b. s/p PVI RFA at [facility] 10/14 c. failed amio (neuro toxicity) and Tikosyn d. 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    PUD (peptic ulcer disease)    Raynaud's disease    SNHL (sensorineural hearing loss)    right ear   Syncopal episodes    Tinnitus    Tinnitus    Ulcerative colitis ([place])    per prior records   Urinary incontinence, urge    Past Surgical History:  Procedure Laterality Date   APPENDECTOMY  1973   AV NODE ABLATION N/A 05/06/2014   Procedure: AV NODE ABLATION;  Surgeon: [name], MD;  Location: [facility] CATH LAB;  Service: Cardiovascular;  Laterality: N/A;   CARDIAC CATHETERIZATION  2014   [facility]   CARDIAC ELECTROPHYSIOLOGY STUDY AND ABLATION     CHOLECYSTECTOMY  2010   COLONOSCOPY  2010   polyps   COLONOSCOPY WITH PROPOFOL N/A 08/29/2017   TAs, no f/u recommended Alice Reichert, Benay Pike, MD)   ESOPHAGOGASTRODUODENOSCOPY (EGD) WITH PROPOFOL N/A 08/29/2017   benign biopsies Alice Reichert, Benay Pike, MD)   [place]  2012   EGD biopsy led to bleeding - needed laparotomy to stop bleed (Dr Jimmye Norman at [facility])   [place] N/A 04/22/2014   STJ single chamber pacemaker implanted by Dr [name]   TRANSESOPHAGEAL ECHOCARDIOGRAM WITH CARDIOVERSION  2014   [facility]   UPPER GI ENDOSCOPY  2012   with polypectomy   VAGINAL HYSTERECTOMY  1973   elective; ovaries remained   Family History  Problem Relation Age of Onset   Arrhythmia Mother    Hypertension Mother    Diabetes Mother    Heart failure Brother    Heart failure Brother    Cancer Brother        colon (with colostomy)   Breast cancer Sister 73   Stroke Sister    Alcohol abuse Brother    Stroke Brother    Diabetes Son    Social History   Socioeconomic History   Marital status: Married    Spouse name: Not on file    Number of children: Not on file   Years of education: Not on file   Highest education level: Not on file  Occupational History   Not on file  Tobacco Use   Smoking status: Never   Smokeless tobacco: Never  Vaping Use   Vaping Use: Never used  Substance and Sexual Activity   Alcohol use: Yes    Alcohol/week: 1.0 standard drink    Types: 1 Glasses of wine per week    Comment: 1-2 glasses wine a month   Drug use: Never   Sexual activity: Never  Other Topics Concern   Not on file  Social History Narrative   Lives with husband.    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)  Edu: HS   Social Determinants of Radio broadcast assistant Strain: Low Risk    Difficulty of Paying Living Expenses: Not hard at all  Food Insecurity: No Food Insecurity   Worried About Charity fundraiser in the Last Year: Never true   [place] in the Last Year: Never true  Transportation Needs: No Transportation Needs   Lack of Transportation (Medical): No   Lack of Transportation (Non-Medical): No  Physical Activity: Not on file  Stress: No Stress Concern Present   Feeling of Stress : Not at all  Social Connections: Moderately Integrated   Frequency of Communication with Friends and Family: Three times a week   Frequency of Social Gatherings with Friends and Family: Twice a week   Attends Religious Services: More than 4 times per year   Active Member of [employer] or Organizations: No   Attends Music therapist: Never   Marital Status: Married    Tobacco Counseling Counseling given: Not Answered   Clinical Intake:  Pre-visit preparation completed: Yes  Pain : No/denies pain     BMI - recorded: 19.2 Nutritional Status: BMI of 19-24  Normal Nutritional Risks: None Diabetes: No  How often do you need to have someone help you when you read instructions, pamphlets, or other written materials from your doctor or pharmacy?: 1 -  Never  Diabetic? No  Interpreter Needed?: No  Information entered by :: Orrin Brigham LPN   Activities of Daily Living In your present state of health, do you have any difficulty performing the following activities: 05/03/2021 12/28/2020  Hearing? Tempie Donning  Vision? N N  Difficulty concentrating or making decisions? Y N  Comment Taking Aricept for memory -  Walking or climbing stairs? N N  Dressing or bathing? N N  Doing errands, shopping? Y N  Comment husband drives -  Conservation officer, nature and eating ? N -  Using the Toilet? N -  In the past six months, have you accidently leaked urine? Y -  Do you have problems with loss of bowel control? Y -  Managing your Medications? N -  Managing your Finances? N -  Housekeeping or managing your Housekeeping? N -  Some recent data might be hidden    Patient Care Team: [name], MD as PCP - General (Family Medicine) [name], MD as PCP - Cardiology (Cardiology) Minna Merritts, MD as Consulting Physician (Cardiology)  Indicate any recent Medical Services you may have received from other than [facility] providers in the past year (date may be approximate).     Assessment:   This is a routine wellness examination for Cherylin.  Hearing/Vision screen Hearing Screening - Comments:: Decrease hearing in right ear Vision Screening - Comments:: Last exam over a year ago  Dietary issues and exercise activities discussed: Current Exercise Habits: Home exercise routine, Type of exercise: Other - see comments (gardening)   Goals Addressed             This Visit's Progress    Patient Stated       Would like to maintain current routine        Depression Screen PHQ 2/9 Scores 05/03/2021 11/02/2020 10/15/2019 10/18/2018 01/17/2017 01/12/2017 11/24/2016  PHQ - 2 Score 0 0 0 4 0 0 2  PHQ- 9 Score - 10 0 16 0 4 11    Fall Risk Fall Risk  05/03/2021 11/02/2020 10/15/2019 10/18/2018 01/17/2017  Falls in the past year? 0 0 0  1 No  Number falls  in past yr: 0 - 0 0 -  Injury with Fall? 0 - 0 1 -  Risk for fall due to : No Fall Risks - Medication side effect - -  Follow up Falls prevention discussed - Falls evaluation completed;Falls prevention discussed - -    FALL RISK PREVENTION PERTAINING TO THE HOME:  Any stairs in or around the home? Yes  If so, are there any without handrails? No  Home free of loose throw rugs in walkways, pet beds, electrical cords, etc? Yes  Adequate lighting in your home to reduce risk of falls? Yes   ASSISTIVE DEVICES UTILIZED TO PREVENT FALLS:  Life alert? No  Use of a cane, walker or w/c? No  Grab bars in the bathroom? Yes  Shower chair or bench in shower? No  Elevated toilet seat or a handicapped toilet? No   TIMED UP AND GO:  Was the test performed? No .    Cognitive Function: Cognitive status assessed by observation. Patient has current diagnosis of mild cognitive impairment. . Patient did not want  to complete screening 6CIT or MMSE.   MMSE - Mini Mental State Exam 10/15/2019 01/17/2017 10/07/2015  Orientation to time 5 5 5   Orientation to Place 5 5 5   Registration 3 3 3   Attention/ Calculation 5 0 0  Recall 3 3 3   Language- name 2 objects - 0 0  Language- repeat 1 1 1   Language- follow 3 step command - 3 3  Language- read & follow direction - 0 0  Write a sentence - 0 0  Copy design - 0 0  Total score - 20 20        Immunizations Immunization History  Administered Date(s) Administered   Fluad Quad(high Dose 65+) 12/24/2018, 12/02/2019   Influenza, High Dose Seasonal PF 12/05/2016, 12/18/2020   Influenza,inj,Quad PF,6+ Mos 11/15/2015, 11/27/2017   Influenza-Unspecified 12/28/2014   PFIZER(Purple Top)SARS-COV-2 Vaccination 03/11/2019, 04/01/2019, 12/19/2019, 03/10/2020   Pneumococcal Conjugate-13 04/03/2014   Pneumococcal Polysaccharide-23 03/16/2010   Zoster Recombinat (Shingrix) 12/23/2020    TDAP status: Due, Education has been provided regarding the importance of this  vaccine. Advised may receive this vaccine at local pharmacy or Health Dept. Aware to provide a copy of the vaccination record if obtained from local pharmacy or Health Dept. Verbalized acceptance and understanding.  Flu Vaccine status: Up to date  Pneumococcal vaccine status: Up to date  Covid-19 vaccine status: Information provided on how to obtain vaccines.   Qualifies for Shingles Vaccine? Yes   Zostavax completed No   Shingrix Completed?: No.    Education has been provided regarding the importance of this vaccine. Patient has been advised to call insurance company to determine out of pocket expense if they have not yet received this vaccine. Advised may also receive vaccine at local pharmacy or Health Dept. Verbalized acceptance and understanding.  Screening Tests Health Maintenance  Topic Date Due   COVID-19 Vaccine (5 - Booster for Pfizer series) 05/05/2020   Zoster Vaccines- Shingrix (2 of 2) 02/17/2021   TETANUS/TDAP  10/15/2023 (Originally 04/23/1952)   Pneumonia Vaccine 81+ Years old  Completed   INFLUENZA VACCINE  Completed   DEXA SCAN  Completed   HPV VACCINES  Aged Out   FOOT EXAM  Discontinued   HEMOGLOBIN A1C  Discontinued   OPHTHALMOLOGY EXAM  Discontinued   URINE MICROALBUMIN  Discontinued    Health Maintenance  Health Maintenance Due  Topic Date Due   COVID-19  Vaccine (5 - Booster for Pfizer series) 05/05/2020   Zoster Vaccines- Shingrix (2 of 2) 02/17/2021    Colorectal cancer screening: No longer required.   Mammogram status: Completed 01/25/21. Repeat every year  Bone Density status: Completed 01/25/21. Results reflect: Bone density results: OSTEOPOROSIS. Repeat every 2 years.  Lung Cancer Screening: (Low Dose CT Chest recommended if Age 39-80 years, 30 pack-year currently smoking OR have quit w/in 15years.) does not qualify.    Additional Screening:  Hepatitis C Screening: does not qualify  Vision Screening: Recommended annual ophthalmology exams  for early detection of glaucoma and other disorders of the eye. Is the patient up to date with their annual eye exam?  No  Who is the provider or what is the name of the office in which the patient attends annual eye exams? Provider Information unavailable    Dental Screening: Recommended annual dental exams for proper oral hygiene  Community Resource Referral / Chronic Care Management: CRR required this visit?  No   CCM required this visit?  No      Plan:     I have personally reviewed and noted the following in the patients chart:   Medical and social history Use of alcohol, tobacco or illicit drugs  Current medications and supplements including opioid prescriptions.  Functional ability and status Nutritional status Physical activity Advanced directives List of other physicians Hospitalizations, surgeries, and ER visits in previous 12 months Vitals Screenings to include cognitive, depression, and falls Referrals and appointments  In addition, I have reviewed and discussed with patient certain preventive protocols, quality metrics, and best practice recommendations. A written personalized care plan for preventive services as well as general preventive health recommendations were provided to patient.   Due to this being a telephonic visit, the after visit summary with patients personalized plan was offered to patient via mail or my-chart. . Patient preferred to pick up at office at next visit.    Loma Messing, LPN   [id number]   Nurse Health Advisor  Nurse Notes: none

## 2021-05-03 ENCOUNTER — Ambulatory Visit (INDEPENDENT_AMBULATORY_CARE_PROVIDER_SITE_OTHER): Payer: Medicare Other

## 2021-05-03 VITALS — Ht 69.0 in | Wt 130.0 lb

## 2021-05-03 DIAGNOSIS — Z0000 Encounter for general adult medical examination without abnormal findings: Secondary | ICD-10-CM

## 2021-05-03 NOTE — Patient Instructions (Signed)
Sierra Kaiser , Thank you for taking time to complete your Medicare Wellness Visit. I appreciate your ongoing commitment to your health goals. Please review the following plan we discussed and let me know if I can assist you in the future.   Screening recommendations/referrals: Colonoscopy: no longer required  Mammogram: up to date, completed 01/25/21, due 01/25/22 Bone Density: up to date , completed 01/25/21, due 01/25/22 Recommended yearly ophthalmology/optometry visit for glaucoma screening and checkup Recommended yearly dental visit for hygiene and checkup  Vaccinations: Influenza vaccine: up to date  Pneumococcal vaccine: up to date  Tdap vaccine: Medicare may cover in the even that your are injured  Shingles vaccine: 1 dose received 12/23/20, 2nd dose due   Covid-19:newest booster available at your local pharmacy   Advanced directives: Please bring a copy of Living Will and/or [place] for your chart.   Conditions/risks identified: see problem list   Next appointment: Follow up in one year for your annual wellness visit    Preventive Care 65 Years and Older, Female Preventive care refers to lifestyle choices and visits with your health care provider that can promote health and wellness. What does preventive care include? A yearly physical exam. This is also called an annual well check. Dental exams once or twice a year. Routine eye exams. Ask your health care provider how often you should have your eyes checked. Personal lifestyle choices, including: Daily care of your teeth and gums. Regular physical activity. Eating a healthy diet. Avoiding tobacco and drug use. Limiting alcohol use. Practicing safe sex. Taking low-dose aspirin every day. Taking vitamin and mineral supplements as recommended by your health care provider. What happens during an annual well check? The services and screenings done by your health care provider during your annual well  check will depend on your age, overall health, lifestyle risk factors, and family history of disease. Counseling  Your health care provider may ask you questions about your: Alcohol use. Tobacco use. Drug use. Emotional well-being. Home and relationship well-being. Sexual activity. Eating habits. History of falls. Memory and ability to understand (cognition). Work and work Statistician. Reproductive health. Screening  You may have the following tests or measurements: Height, weight, and BMI. Blood pressure. Lipid and cholesterol levels. These may be checked every 5 years, or more frequently if you are over 8 years old. Skin check. Lung cancer screening. You may have this screening every year starting at age 22 if you have a 30-pack-year history of smoking and currently smoke or have quit within the past 15 years. Fecal occult blood test (FOBT) of the stool. You may have this test every year starting at age 24. Flexible sigmoidoscopy or colonoscopy. You may have a sigmoidoscopy every 5 years or a colonoscopy every 10 years starting at age 67. Hepatitis C blood test. Hepatitis B blood test. Sexually transmitted disease (STD) testing. Diabetes screening. This is done by checking your blood sugar (glucose) after you have not eaten for a while (fasting). You may have this done every 1-3 years. Bone density scan. This is done to screen for osteoporosis. You may have this done starting at age 77. Mammogram. This may be done every 1-2 years. Talk to your health care provider about how often you should have regular mammograms. Talk with your health care provider about your test results, treatment options, and if necessary, the need for more tests. Vaccines  Your health care provider may recommend certain vaccines, such as: Influenza vaccine. This is recommended every  year. Tetanus, diphtheria, and acellular pertussis (Tdap, Td) vaccine. You may need a Td booster every 10 years. Zoster  vaccine. You may need this after age 36. Pneumococcal 13-valent conjugate (PCV13) vaccine. One dose is recommended after age 49. Pneumococcal polysaccharide (PPSV23) vaccine. One dose is recommended after age 55. Talk to your health care provider about which screenings and vaccines you need and how often you need them. This information is not intended to replace advice given to you by your health care provider. Make sure you discuss any questions you have with your health care provider. Document Released: 03/19/2015 Document Revised: 11/10/2015 Document Reviewed: 12/22/2014 Elsevier Interactive Patient Education  2017 [place] Prevention in the Home Falls can cause injuries. They can happen to people of all ages. There are many things you can do to make your home safe and to help prevent falls. What can I do on the outside of my home? Regularly fix the edges of walkways and driveways and fix any cracks. Remove anything that might make you trip as you walk through a door, such as a raised step or threshold. Trim any bushes or trees on the path to your home. Use bright outdoor lighting. Clear any walking paths of anything that might make someone trip, such as rocks or tools. Regularly check to see if handrails are loose or broken. Make sure that both sides of any steps have handrails. Any raised decks and porches should have guardrails on the edges. Have any leaves, snow, or ice cleared regularly. Use sand or salt on walking paths during winter. Clean up any spills in your garage right away. This includes oil or grease spills. What can I do in the bathroom? Use night lights. Install grab bars by the toilet and in the tub and shower. Do not use towel bars as grab bars. Use non-skid mats or decals in the tub or shower. If you need to sit down in the shower, use a plastic, non-slip stool. Keep the floor dry. Clean up any water that spills on the floor as soon as it happens. Remove  soap buildup in the tub or shower regularly. Attach bath mats securely with double-sided non-slip rug tape. Do not have throw rugs and other things on the floor that can make you trip. What can I do in the bedroom? Use night lights. Make sure that you have a light by your bed that is easy to reach. Do not use any sheets or blankets that are too big for your bed. They should not hang down onto the floor. Have a firm chair that has side arms. You can use this for support while you get dressed. Do not have throw rugs and other things on the floor that can make you trip. What can I do in the kitchen? Clean up any spills right away. Avoid walking on wet floors. Keep items that you use a lot in easy-to-reach places. If you need to reach something above you, use a strong step stool that has a grab bar. Keep electrical cords out of the way. Do not use floor polish or wax that makes floors slippery. If you must use wax, use non-skid floor wax. Do not have throw rugs and other things on the floor that can make you trip. What can I do with my stairs? Do not leave any items on the stairs. Make sure that there are handrails on both sides of the stairs and use them. Fix handrails that are broken or  loose. Make sure that handrails are as long as the stairways. Check any carpeting to make sure that it is firmly attached to the stairs. Fix any carpet that is loose or worn. Avoid having throw rugs at the top or bottom of the stairs. If you do have throw rugs, attach them to the floor with carpet tape. Make sure that you have a light switch at the top of the stairs and the bottom of the stairs. If you do not have them, ask someone to add them for you. What else can I do to help prevent falls? Wear shoes that: Do not have high heels. Have rubber bottoms. Are comfortable and fit you well. Are closed at the toe. Do not wear sandals. If you use a stepladder: Make sure that it is fully opened. Do not climb a  closed stepladder. Make sure that both sides of the stepladder are locked into place. Ask someone to hold it for you, if possible. Clearly mark and make sure that you can see: Any grab bars or handrails. First and last steps. Where the edge of each step is. Use tools that help you move around (mobility aids) if they are needed. These include: Canes. Walkers. Scooters. Crutches. Turn on the lights when you go into a dark area. Replace any light bulbs as soon as they burn out. Set up your furniture so you have a clear path. Avoid moving your furniture around. If any of your floors are uneven, fix them. If there are any pets around you, be aware of where they are. Review your medicines with your doctor. Some medicines can make you feel dizzy. This can increase your chance of falling. Ask your doctor what other things that you can do to help prevent falls. This information is not intended to replace advice given to you by your health care provider. Make sure you discuss any questions you have with your health care provider. Document Released: 12/17/2008 Document Revised: 07/29/2015 Document Reviewed: 03/27/2014 Elsevier Interactive Patient Education  2017 [employer].

## 2021-05-04 ENCOUNTER — Ambulatory Visit: Payer: Medicare Other | Admitting: Medical

## 2021-05-11 ENCOUNTER — Ambulatory Visit (INDEPENDENT_AMBULATORY_CARE_PROVIDER_SITE_OTHER): Payer: Medicare Other

## 2021-05-11 DIAGNOSIS — I4821 Permanent atrial fibrillation: Secondary | ICD-10-CM | POA: Diagnosis not present

## 2021-05-11 LAB — CUP PACEART REMOTE DEVICE CHECK
Battery Remaining Longevity: 60 mo
Battery Remaining Percentage: 44 %
Battery Voltage: 2.95 V
Brady Statistic RV Percent Paced: 98 %
Date Time Interrogation Session: 20230308030147
Implantable Lead Implant Date: 20160217
Implantable Lead Location: 753860
Implantable Lead Model: 1948
Implantable Pulse Generator Implant Date: 20160217
Lead Channel Impedance Value: 550 Ohm
Lead Channel Pacing Threshold Amplitude: 0.5 V
Lead Channel Pacing Threshold Pulse Width: 0.4 ms
Lead Channel Sensing Intrinsic Amplitude: 7.3 mV
Lead Channel Setting Pacing Amplitude: 0.75 V
Lead Channel Setting Pacing Pulse Width: 0.4 ms
Lead Channel Setting Sensing Sensitivity: 2.5 mV
Pulse Gen Model: 2240
Pulse Gen Serial Number: 3050195

## 2021-05-18 ENCOUNTER — Other Ambulatory Visit: Payer: Self-pay

## 2021-05-18 ENCOUNTER — Ambulatory Visit (INDEPENDENT_AMBULATORY_CARE_PROVIDER_SITE_OTHER): Payer: Medicare Other

## 2021-05-18 DIAGNOSIS — E538 Deficiency of other specified B group vitamins: Secondary | ICD-10-CM | POA: Diagnosis not present

## 2021-05-18 MED ORDER — CYANOCOBALAMIN 1000 MCG/ML IJ SOLN
1000.0000 ug | Freq: Once | INTRAMUSCULAR | Status: AC
  Administered 2021-05-18: 1000 ug via INTRAMUSCULAR

## 2021-05-18 NOTE — Progress Notes (Addendum)
Per orders of Dr. Gutierrez, injection of monthly B12 1000 mcg/ml given by Lavaun Greenfield P Shadavia Dampier, CMA in Left Deltoid. Patient tolerated injection well.  

## 2021-05-23 ENCOUNTER — Other Ambulatory Visit: Payer: Self-pay | Admitting: Cardiovascular Disease

## 2021-05-23 NOTE — Telephone Encounter (Signed)
Pt last saw Dr Rockey Situ 04/13/21, last labs 10/29/20 Creat 1.08, age 88, weight 59kg, CrCl 34.83, based on CrCl pt is on appropriate dosage of Xarelto '15mg'$  QD for afib.  Will refill rx.  ?

## 2021-05-23 NOTE — Telephone Encounter (Signed)
Prescription refill request for Xarelto received.  ?Indication: Atrial Fib ?Last office visit: 04/13/21  [name] MD ?Weight: 59kg ?Age: 88 ?Scr: 1.17 on 03/30/21 ?CrCl: 32.15 ? ?Based on above findings Xarelto '15mg'$  daily is the appropriate dose.  Refill approved. ? ?

## 2021-05-24 NOTE — Progress Notes (Signed)
Remote pacemaker transmission.   

## 2021-05-25 ENCOUNTER — Other Ambulatory Visit: Payer: Self-pay | Admitting: Cardiovascular Disease

## 2021-06-29 ENCOUNTER — Encounter: Payer: Self-pay | Admitting: Family Medicine

## 2021-06-29 ENCOUNTER — Ambulatory Visit (INDEPENDENT_AMBULATORY_CARE_PROVIDER_SITE_OTHER): Payer: Medicare Other | Admitting: Family Medicine

## 2021-06-29 VITALS — BP 134/66 | HR 71 | Temp 97.5°F | Ht 65.75 in | Wt 128.5 lb

## 2021-06-29 DIAGNOSIS — M810 Age-related osteoporosis without current pathological fracture: Secondary | ICD-10-CM

## 2021-06-29 DIAGNOSIS — E538 Deficiency of other specified B group vitamins: Secondary | ICD-10-CM

## 2021-06-29 DIAGNOSIS — I5032 Chronic diastolic (congestive) heart failure: Secondary | ICD-10-CM

## 2021-06-29 DIAGNOSIS — E611 Iron deficiency: Secondary | ICD-10-CM | POA: Diagnosis not present

## 2021-06-29 DIAGNOSIS — G3184 Mild cognitive impairment, so stated: Secondary | ICD-10-CM

## 2021-06-29 DIAGNOSIS — N1832 Chronic kidney disease, stage 3b: Secondary | ICD-10-CM

## 2021-06-29 DIAGNOSIS — E785 Hyperlipidemia, unspecified: Secondary | ICD-10-CM | POA: Diagnosis not present

## 2021-06-29 DIAGNOSIS — E038 Other specified hypothyroidism: Secondary | ICD-10-CM | POA: Diagnosis not present

## 2021-06-29 DIAGNOSIS — R0609 Other forms of dyspnea: Secondary | ICD-10-CM | POA: Diagnosis not present

## 2021-06-29 DIAGNOSIS — F419 Anxiety disorder, unspecified: Secondary | ICD-10-CM

## 2021-06-29 DIAGNOSIS — E559 Vitamin D deficiency, unspecified: Secondary | ICD-10-CM | POA: Diagnosis not present

## 2021-06-29 DIAGNOSIS — R5382 Chronic fatigue, unspecified: Secondary | ICD-10-CM

## 2021-06-29 DIAGNOSIS — Z7901 Long term (current) use of anticoagulants: Secondary | ICD-10-CM | POA: Diagnosis not present

## 2021-06-29 DIAGNOSIS — L299 Pruritus, unspecified: Secondary | ICD-10-CM

## 2021-06-29 MED ORDER — CYANOCOBALAMIN 1000 MCG/ML IJ SOLN
1000.0000 ug | Freq: Once | INTRAMUSCULAR | Status: AC
  Administered 2021-06-29: 1000 ug via INTRAMUSCULAR

## 2021-06-29 MED ORDER — VITAMIN D3 25 MCG (1000 UT) PO CAPS: 1.0000 | ORAL_CAPSULE | Freq: Every day | ORAL | Status: DC

## 2021-06-29 NOTE — Progress Notes (Signed)
? ? Patient ID: Sierra Kaiser, female    DOB: 11-08-1933, 88 y.o.   MRN: [id number] ? ?This visit was conducted in person. ? ?BP 134/66   Pulse 71   Temp (!) 97.5 ?F (36.4 ?C) (Temporal)   Ht 5' 5.75" (1.67 m)   Wt 128 lb 8 oz (58.3 kg)   SpO2 97%   BMI 20.90 kg/m?   ? ?CC: CPE converted to f/u visit  ?Subjective:  ? ?HPI: ?Sierra Kaiser is a 88 y.o. female presenting on 06/29/2021 for Annual Exam [facility] prt [facility]. ) ? ? ?Last physical was 10/2020. Lat AMW 04/2021.  ?Started new insurance 06/04/2021 [facility]).  ? ?Stopped all Sierra Kaiser medicines 6 wks ago "they weren't doing me any good".  ?Sierra Kaiser feels better off medications - skin is healing better, loose stools have resolved.  ?Notes ongoing urinary urge incontinence - wears pad when going out of the house.  ? ?MCI diagnosed by Dr Manuella Ghazi 05/2020, however Sierra Kaiser stopped donepezil. Sierra Kaiser also stopped lexapro prescribed by neurology.  ?Continues monthly b12 shots - next one due today  ? ?Sierra Kaiser continues xarelto '15mg'$  daily.  ?Sierra Kaiser stopped cholesterol medications (zetia '10mg'$ , crestor '20mg'$  prescribed by Dr Rockey Situ) ? ?Chronic itching continues. Sierra Kaiser takes 1/2-1 benadryl at bedtime as needed.  ?   ? ?Relevant past medical, surgical, family and social history reviewed and updated as indicated. Interim medical history since our last visit reviewed. ?Allergies and medications reviewed and updated. ?Outpatient Medications Prior to Visit  ?Medication Sig Dispense Refill  ? cyanocobalamin (,VITAMIN B-12,) 1000 MCG/ML injection Inject 1 mL (1,000 mcg total) into the muscle every 30 (thirty) days.    ? fluticasone (FLONASE) 50 MCG/ACT nasal spray Place 2 sprays into both nostrils daily. 16 g 3  ? furosemide (LASIX) 20 MG tablet TAKE 1 TABLET (20 MG TOTAL) BY MOUTH AS NEEDED FOR FLUID OR EDEMA. 30 tablet 3  ? Polyethyl Glycol-Propyl Glycol (SYSTANE OP) Place 1 drop into both eyes daily as needed (dry eyes).    ? XARELTO 15 MG TABS tablet TAKE 1 TABLET BY MOUTH DAILY WITH SUPPER 90 tablet 1  ? triamcinolone  (KENALOG) 0.025 % ointment Apply 1 application topically 2 (two) times daily.     ? donepezil (ARICEPT) 5 MG tablet Take 5 mg by mouth daily. Taking in the morning (Patient not taking: Reported on 06/29/2021)    ? escitalopram (LEXAPRO) 10 MG tablet Take 10 mg by mouth daily. (Patient not taking: Reported on 06/29/2021)    ? ezetimibe (ZETIA) 10 MG tablet TAKE 1 TABLET BY MOUTH EVERY DAY (Patient not taking: Reported on 06/29/2021) 90 tablet 2  ? levothyroxine (SYNTHROID) 50 MCG tablet Take 1 tablet (50 mcg total) by mouth daily. (Patient not taking: Reported on 06/29/2021) 90 tablet 3  ? rosuvastatin (CRESTOR) 20 MG tablet TAKE 1 TABLET BY MOUTH EVERY OTHER DAY (Patient not taking: Reported on 06/29/2021) 45 tablet 2  ? ?No facility-administered medications prior to visit.  ?  ? ?Per HPI unless specifically indicated in ROS section below ?Review of Systems ? ?Objective:  ?BP 134/66   Pulse 71   Temp (!) 97.5 ?F (36.4 ?C) (Temporal)   Ht 5' 5.75" (1.67 m)   Wt 128 lb 8 oz (58.3 kg)   SpO2 97%   BMI 20.90 kg/m?   ?Wt Readings from Last 3 Encounters:  ?06/29/21 128 lb 8 oz (58.3 kg)  ?05/03/21 130 lb (59 kg)  ?04/13/21 130 lb (59 kg)  ?  ?  ?  Physical Exam ?Vitals and nursing note reviewed.  ?Constitutional:   ?   Appearance: Normal appearance. Sierra Kaiser is not ill-appearing.  ?Neck:  ?   Thyroid: No thyroid mass or thyromegaly.  ?Cardiovascular:  ?   Rate and Rhythm: Normal rate and regular rhythm.  ?   Pulses: Normal pulses.  ?   Heart sounds: Normal heart sounds. No murmur heard. ?   Comments: Sounds regular ?Pulmonary:  ?   Effort: Pulmonary effort is normal. No respiratory distress.  ?   Breath sounds: Normal breath sounds. No wheezing, rhonchi or rales.  ?Musculoskeletal:  ?   Right lower leg: No edema.  ?   Left lower leg: No edema.  ?Skin: ?   General: Skin is warm and dry.  ?   Findings: No rash.  ?Neurological:  ?   Mental Status: Sierra Kaiser is alert.  ?Psychiatric:     ?   Mood and Affect: Mood normal.     ?   Behavior:  Behavior normal.  ? ?   ?Lab Results  ?Component Value Date  ? CREATININE 1.17 03/30/2021  ? BUN 22 03/30/2021  ? NA 142 03/30/2021  ? K 5.2 No hemolysis seen (H) 03/30/2021  ? CL 107 03/30/2021  ? CO2 28 03/30/2021  ?  ?Lab Results  ?Component Value Date  ? CHOL 123 10/29/2020  ? HDL 56.40 10/29/2020  ? [place] 52 10/29/2020  ? TRIG 72.0 10/29/2020  ? CHOLHDL 2 10/29/2020  ?  ?Assessment & Plan:  ? ?Problem List Items Addressed This Visit   ? ? Chronic anticoagulation  ?  Continues xarelto '15mg'$  daily in h/o afib s/p ablation.  ? ?  ?  ? Chronic diastolic CHF (congestive heart failure) ([place])  ?  Manages with PRN lasix. ?Does have chronic exertional dyspnea.  ? ?  ?  ? Chronic dyspnea  ?  Thought multifactorial. Has had pulmonary and cardiac evaluation.  ?In no recent CXR, will ask Sierra Kaiser to return at Sierra Kaiser convenience for this. ? ?  ?  ? Relevant Orders  ? DG Chest 2 View  ? Hyperlipidemia - Primary  ?  Chronic. On Sierra Kaiser own, [place] has stopped zetia (QD) and crestor (QOD) over the past 6 weeks. States Sierra Kaiser has felt overall better since stopping meds. Did recommend repeat cholesterol levels (Sierra Kaiser will return fasting for this) then will reassess med need.  ?Sierra Kaiser does not have h/o CAD or CVA.  ? ?  ?  ? Relevant Orders  ? Lipid panel  ? CKD (chronic kidney disease) stage 3, GFR 30-59 ml/min (HCC)  ?  Chronic, stable. Update renal panel when Sierra Kaiser returns for fasting labs ? ?  ?  ? Relevant Orders  ? Renal function panel  ? Iron deficiency  ?  Update iron panel off replacement. ?Latest Feraheme infusion 11/2019.  ? ?  ?  ? Relevant Orders  ? Ferritin  ? IBC panel  ? Osteoporosis  ?  Discussed recent DEXA showing mild osteoporosis with T score -2.5. Sierra Kaiser is hesitant for any further medication at this time. Will focus on dietary calcium intake, vitamin D supplementation and regular weight bearing exercises.  ? ?  ?  ? Relevant Medications  ? Cholecalciferol (VITAMIN D3) 25 MCG (1000 UT) CAPS  ? Chronic fatigue  ?  Ongoing, chronic for  years. Previous workup unrevealing. Will rpt labs.  ? ?  ?  ? Low serum vitamin B12  ?  Continue monthly B12 shots.  ?Will not  check B12 given Sierra Kaiser just received a shot.  ? ?  ?  ? Anxiety  ?  Sierra Kaiser stopped lexapro (started by neuro) ? ?  ?  ? Pruritus  ?  Ongoing, managed with oral benadryl PRN.  ?Reviewed risks of ongoing benadryl use including possible anticholinergic side effects.  ? ?  ?  ? Subclinical hypothyroidism  ?  Update labs off thyroid replacement ? ?  ?  ? Relevant Orders  ? TSH  ? T4, free  ? T3  ? Vitamin D deficiency  ?  rec start vit D 1000 IU daily.  ? ?  ?  ? Relevant Orders  ? VITAMIN D 25 Hydroxy (Vit-D Deficiency, Fractures)  ? Mild cognitive impairment with memory loss  ?  Sierra Kaiser stopped aricept and lexapro and feels better off these medications - will remain off at this time.  ? ?  ?  ?  ? ?Meds ordered this encounter  ?Medications  ? Cholecalciferol (VITAMIN D3) 25 MCG (1000 UT) CAPS  ?  Sig: Take 1 capsule (1,000 Units total) by mouth daily.  ?  Dispense:  30 capsule  ? cyanocobalamin ((VITAMIN B-12)) injection 1,000 mcg  ? ?Orders Placed This Encounter  ?Procedures  ? DG Chest 2 View  ?  Standing Status:   Future  ?  Standing Expiration Date:   07/01/2022  ?  Order Specific Question:   Reason for Exam (SYMPTOM  OR DIAGNOSIS REQUIRED)  ?  Answer:   chronic exertional dyspnea  ?  Order Specific Question:   Preferred imaging location?  ?  Answer:   [facility]  ? Lipid panel  ?  Standing Status:   Future  ?  Number of Occurrences:   1  ?  Standing Expiration Date:   06/30/2022  ? Renal function panel  ?  Standing Status:   Future  ?  Number of Occurrences:   1  ?  Standing Expiration Date:   06/30/2022  ? TSH  ?  Standing Status:   Future  ?  Number of Occurrences:   1  ?  Standing Expiration Date:   06/30/2022  ? T4, free  ?  Standing Status:   Future  ?  Number of Occurrences:   1  ?  Standing Expiration Date:   06/30/2022  ? T3  ?  Standing Status:   Future  ?  Number of Occurrences:   1   ?  Standing Expiration Date:   06/30/2022  ? VITAMIN D 25 Hydroxy (Vit-D Deficiency, Fractures)  ?  Standing Status:   Future  ?  Number of Occurrences:   1  ?  Standing Expiration Date:   06/30/2022  ? Ferritin

## 2021-06-29 NOTE — Patient Instructions (Addendum)
B12 shot today  ?Return at your convenience for fasting labwork. ?Start vitamin D3 1000 units over the counter daily.  ?Make sure you're getting good calcium in your diet - diary products (yogurt, cheese) and leafy green vegetables. Also work on regular weight bearing exercise (walking).  ?I recommend repeating bone density scan in 3 years.  ?Schedule physical in 3 months.  ?

## 2021-06-30 ENCOUNTER — Telehealth: Payer: Self-pay | Admitting: Family Medicine

## 2021-06-30 ENCOUNTER — Other Ambulatory Visit (INDEPENDENT_AMBULATORY_CARE_PROVIDER_SITE_OTHER): Payer: Medicare Other

## 2021-06-30 DIAGNOSIS — N1832 Chronic kidney disease, stage 3b: Secondary | ICD-10-CM | POA: Diagnosis not present

## 2021-06-30 DIAGNOSIS — E559 Vitamin D deficiency, unspecified: Secondary | ICD-10-CM | POA: Diagnosis not present

## 2021-06-30 DIAGNOSIS — E038 Other specified hypothyroidism: Secondary | ICD-10-CM | POA: Diagnosis not present

## 2021-06-30 DIAGNOSIS — E785 Hyperlipidemia, unspecified: Secondary | ICD-10-CM

## 2021-06-30 LAB — LIPID PANEL
Cholesterol: 191 mg/dL (ref 0–200)
HDL: 63.3 mg/dL (ref >39.00)
LDL Cholesterol: 109 mg/dL — ABNORMAL HIGH (ref 0–99)
NonHDL: 127.74
Total CHOL/HDL Ratio: 3
Triglycerides: 95 mg/dL (ref 0.0–149.0)
VLDL: 19 mg/dL (ref 0.0–40.0)

## 2021-06-30 LAB — RENAL FUNCTION PANEL
Albumin: 4.4 g/dL (ref 3.5–5.2)
BUN: 27 mg/dL — ABNORMAL HIGH (ref 6–23)
CO2: 28 mEq/L (ref 19–32)
Calcium: 9.5 mg/dL (ref 8.4–10.5)
Chloride: 107 mEq/L (ref 96–112)
Creatinine, Ser: 1.03 mg/dL (ref 0.40–1.20)
GFR: 49.35 mL/min — ABNORMAL LOW (ref >60.00)
Glucose, Bld: 80 mg/dL (ref 70–99)
Phosphorus: 3.7 mg/dL (ref 2.3–4.6)
Potassium: 4.3 mEq/L (ref 3.5–5.1)
Sodium: 142 mEq/L (ref 135–145)

## 2021-06-30 LAB — T4, FREE: Free T4: 0.59 ng/dL — ABNORMAL LOW (ref 0.60–1.60)

## 2021-06-30 LAB — VITAMIN D 25 HYDROXY (VIT D DEFICIENCY, FRACTURES): VITD: 25.48 ng/mL — ABNORMAL LOW (ref 30.00–100.00)

## 2021-06-30 LAB — TSH: TSH: 5.22 u[IU]/mL (ref 0.35–5.50)

## 2021-06-30 LAB — T3: T3, Total: 98 ng/dL (ref 76–181)

## 2021-06-30 NOTE — Assessment & Plan Note (Addendum)
Continues xarelto '15mg'$  daily in h/o afib s/p ablation.  ?

## 2021-06-30 NOTE — Assessment & Plan Note (Addendum)
Chronic. On her own, Sierra Kaiser has stopped zetia (QD) and crestor (QOD) over the past 6 weeks. States she has felt overall better since stopping meds. Did recommend repeat cholesterol levels (she will return fasting for this) then will reassess med need.  ?She does not have h/o CAD or CVA.  ?

## 2021-06-30 NOTE — Assessment & Plan Note (Signed)
Update labs off thyroid replacement ?

## 2021-06-30 NOTE — Telephone Encounter (Signed)
Spoke with pt relaying Dr. Synthia Innocent message.  Pt verbalizes understanding and will come in next week for CXR.  ?

## 2021-06-30 NOTE — Assessment & Plan Note (Signed)
Manages with PRN lasix. ?Does have chronic exertional dyspnea.  ?

## 2021-06-30 NOTE — Assessment & Plan Note (Signed)
Ongoing, chronic for years. Previous workup unrevealing. Will rpt labs.  ?

## 2021-06-30 NOTE — Assessment & Plan Note (Signed)
Chronic, stable. Update renal panel when she returns for fasting labs ?

## 2021-06-30 NOTE — Assessment & Plan Note (Signed)
She stopped lexapro (started by neuro) ?

## 2021-06-30 NOTE — Assessment & Plan Note (Signed)
Ongoing, managed with oral benadryl PRN.  ?Reviewed risks of ongoing benadryl use including possible anticholinergic side effects.  ?

## 2021-06-30 NOTE — Assessment & Plan Note (Signed)
Continue monthly B12 shots.  ?Will not check B12 given she just received a shot.  ?

## 2021-06-30 NOTE — Assessment & Plan Note (Signed)
Update iron panel off replacement. ?Latest Feraheme infusion 11/2019.  ?

## 2021-06-30 NOTE — Assessment & Plan Note (Signed)
Discussed recent DEXA showing mild osteoporosis with T score -2.5. She is hesitant for any further medication at this time. Will focus on dietary calcium intake, vitamin D supplementation and regular weight bearing exercises.  ?

## 2021-06-30 NOTE — Telephone Encounter (Signed)
As pt hasn't had recent CXR I'd like her to come in for this for her ongoing chronic shortness of breath. I ordered CXR.  ?

## 2021-06-30 NOTE — Assessment & Plan Note (Signed)
She stopped aricept and lexapro and feels better off these medications - will remain off at this time.  ?

## 2021-06-30 NOTE — Assessment & Plan Note (Signed)
Thought multifactorial. Has had pulmonary and cardiac evaluation.  ?In no recent CXR, will ask her to return at her convenience for this. ?

## 2021-06-30 NOTE — Assessment & Plan Note (Signed)
rec start vit D 1000 IU daily.  

## 2021-07-04 ENCOUNTER — Ambulatory Visit (INDEPENDENT_AMBULATORY_CARE_PROVIDER_SITE_OTHER)
Admission: RE | Admit: 2021-07-04 | Discharge: 2021-07-04 | Disposition: A | Discharge status: Home / Self Care | Payer: Medicare Other | Source: Ambulatory Visit | Attending: Family Medicine | Admitting: Family Medicine

## 2021-07-04 DIAGNOSIS — R0609 Other forms of dyspnea: Secondary | ICD-10-CM

## 2021-07-04 DIAGNOSIS — R0600 Dyspnea, unspecified: Secondary | ICD-10-CM | POA: Diagnosis not present

## 2021-07-29 ENCOUNTER — Encounter: Payer: Self-pay | Admitting: Internal Medicine

## 2021-08-10 ENCOUNTER — Ambulatory Visit (INDEPENDENT_AMBULATORY_CARE_PROVIDER_SITE_OTHER): Payer: Medicare Other

## 2021-08-10 DIAGNOSIS — I428 Other cardiomyopathies: Secondary | ICD-10-CM | POA: Diagnosis not present

## 2021-08-11 LAB — CUP PACEART REMOTE DEVICE CHECK
Battery Remaining Longevity: 58 mo
Battery Remaining Percentage: 42 %
Battery Voltage: 2.95 V
Brady Statistic RV Percent Paced: 99 %
Date Time Interrogation Session: 20230607024321
Implantable Lead Implant Date: 20160217
Implantable Lead Location: 753860
Implantable Lead Model: 1948
Implantable Pulse Generator Implant Date: 20160217
Lead Channel Impedance Value: 580 Ohm
Lead Channel Pacing Threshold Amplitude: 0.5 V
Lead Channel Pacing Threshold Pulse Width: 0.4 ms
Lead Channel Sensing Intrinsic Amplitude: 7.2 mV
Lead Channel Setting Pacing Amplitude: 0.75 V
Lead Channel Setting Pacing Pulse Width: 0.4 ms
Lead Channel Setting Sensing Sensitivity: 2.5 mV
Pulse Gen Model: 2240
Pulse Gen Serial Number: 3050195

## 2021-08-18 ENCOUNTER — Ambulatory Visit (INDEPENDENT_AMBULATORY_CARE_PROVIDER_SITE_OTHER): Payer: Medicare Other

## 2021-08-18 DIAGNOSIS — E538 Deficiency of other specified B group vitamins: Secondary | ICD-10-CM | POA: Diagnosis not present

## 2021-08-18 MED ORDER — CYANOCOBALAMIN 1000 MCG/ML IJ SOLN
1000.0000 ug | Freq: Once | INTRAMUSCULAR | Status: AC
  Administered 2021-08-18: 1000 ug via INTRAMUSCULAR

## 2021-08-18 NOTE — Progress Notes (Signed)
Per orders of Dr. Graham Duncan, injection of B-12 given by Lalisa Kiehn in right deltoid. Patient tolerated injection well.   

## 2021-08-19 NOTE — Progress Notes (Signed)
Remote pacemaker transmission.   

## 2021-09-18 ENCOUNTER — Other Ambulatory Visit: Payer: Self-pay | Admitting: Family Medicine

## 2021-09-18 DIAGNOSIS — N1832 Chronic kidney disease, stage 3b: Secondary | ICD-10-CM

## 2021-09-18 DIAGNOSIS — E785 Hyperlipidemia, unspecified: Secondary | ICD-10-CM

## 2021-09-18 DIAGNOSIS — R768 Other specified abnormal immunological findings in serum: Secondary | ICD-10-CM

## 2021-09-18 DIAGNOSIS — E038 Other specified hypothyroidism: Secondary | ICD-10-CM

## 2021-09-18 DIAGNOSIS — E611 Iron deficiency: Secondary | ICD-10-CM

## 2021-09-18 DIAGNOSIS — M810 Age-related osteoporosis without current pathological fracture: Secondary | ICD-10-CM

## 2021-09-18 DIAGNOSIS — D472 Monoclonal gammopathy: Secondary | ICD-10-CM

## 2021-09-18 DIAGNOSIS — E538 Deficiency of other specified B group vitamins: Secondary | ICD-10-CM

## 2021-09-21 ENCOUNTER — Other Ambulatory Visit (INDEPENDENT_AMBULATORY_CARE_PROVIDER_SITE_OTHER): Payer: Medicare Other

## 2021-09-21 DIAGNOSIS — D472 Monoclonal gammopathy: Secondary | ICD-10-CM

## 2021-09-21 DIAGNOSIS — M810 Age-related osteoporosis without current pathological fracture: Secondary | ICD-10-CM | POA: Diagnosis not present

## 2021-09-21 DIAGNOSIS — N1832 Chronic kidney disease, stage 3b: Secondary | ICD-10-CM | POA: Diagnosis not present

## 2021-09-21 DIAGNOSIS — E038 Other specified hypothyroidism: Secondary | ICD-10-CM

## 2021-09-21 DIAGNOSIS — E538 Deficiency of other specified B group vitamins: Secondary | ICD-10-CM | POA: Diagnosis not present

## 2021-09-21 DIAGNOSIS — E785 Hyperlipidemia, unspecified: Secondary | ICD-10-CM

## 2021-09-21 DIAGNOSIS — R7689 Other specified abnormal immunological findings in serum: Secondary | ICD-10-CM

## 2021-09-21 DIAGNOSIS — D801 Nonfamilial hypogammaglobulinemia: Secondary | ICD-10-CM | POA: Diagnosis not present

## 2021-09-21 DIAGNOSIS — R768 Other specified abnormal immunological findings in serum: Secondary | ICD-10-CM

## 2021-09-21 DIAGNOSIS — E611 Iron deficiency: Secondary | ICD-10-CM | POA: Diagnosis not present

## 2021-09-21 LAB — COMPREHENSIVE METABOLIC PANEL
ALT: 9 U/L (ref 0–35)
AST: 14 U/L (ref 0–37)
Albumin: 4.5 g/dL (ref 3.5–5.2)
Alkaline Phosphatase: 61 U/L (ref 39–117)
BUN: 24 mg/dL — ABNORMAL HIGH (ref 6–23)
CO2: 28 mEq/L (ref 19–32)
Calcium: 9.4 mg/dL (ref 8.4–10.5)
Chloride: 106 mEq/L (ref 96–112)
Creatinine, Ser: 1.18 mg/dL (ref 0.40–1.20)
GFR: 41.85 mL/min — ABNORMAL LOW (ref >60.00)
Glucose, Bld: 66 mg/dL — ABNORMAL LOW (ref 70–99)
Potassium: 4.1 mEq/L (ref 3.5–5.1)
Sodium: 141 mEq/L (ref 135–145)
Total Bilirubin: 0.7 mg/dL (ref 0.2–1.2)
Total Protein: 6.4 g/dL (ref 6.0–8.3)

## 2021-09-21 LAB — MICROALBUMIN / CREATININE URINE RATIO
Creatinine,U: 204.7 mg/dL
Microalb Creat Ratio: 3.7 mg/g (ref 0.0–30.0)
Microalb, Ur: 7.6 mg/dL — ABNORMAL HIGH (ref 0.0–1.9)

## 2021-09-21 LAB — CBC WITH DIFFERENTIAL/PLATELET
Basophils Absolute: 0.1 10*3/uL (ref 0.0–0.1)
Basophils Relative: 2.1 % (ref 0.0–3.0)
Eosinophils Absolute: 0.2 10*3/uL (ref 0.0–0.7)
Eosinophils Relative: 3.3 % (ref 0.0–5.0)
HCT: 41.2 % (ref 36.0–46.0)
Hemoglobin: 14.1 g/dL (ref 12.0–15.0)
Lymphocytes Relative: 35.3 % (ref 12.0–46.0)
Lymphs Abs: 1.7 10*3/uL (ref 0.7–4.0)
MCHC: 34.2 g/dL (ref 30.0–36.0)
MCV: 95.4 fl (ref 78.0–100.0)
Monocytes Absolute: 0.4 10*3/uL (ref 0.1–1.0)
Monocytes Relative: 9 % (ref 3.0–12.0)
Neutro Abs: 2.5 10*3/uL (ref 1.4–7.7)
Neutrophils Relative %: 50.3 % (ref 43.0–77.0)
Platelets: 161 10*3/uL (ref 150.0–400.0)
RBC: 4.32 Mil/uL (ref 3.87–5.11)
RDW: 13.2 % (ref 11.5–15.5)
WBC: 5 10*3/uL (ref 4.0–10.5)

## 2021-09-21 LAB — T4, FREE: Free T4: 0.69 ng/dL (ref 0.60–1.60)

## 2021-09-21 LAB — IBC PANEL
Iron: 102 ug/dL (ref 42–145)
Saturation Ratios: 27.2 % (ref 20.0–50.0)
TIBC: 375.2 ug/dL (ref 250.0–450.0)
Transferrin: 268 mg/dL (ref 212.0–360.0)

## 2021-09-21 LAB — TSH: TSH: 5.86 u[IU]/mL — ABNORMAL HIGH (ref 0.35–5.50)

## 2021-09-21 LAB — VITAMIN D 25 HYDROXY (VIT D DEFICIENCY, FRACTURES): VITD: 30.91 ng/mL (ref 30.00–100.00)

## 2021-09-21 LAB — FERRITIN: Ferritin: 19.7 ng/mL (ref 10.0–291.0)

## 2021-09-21 LAB — VITAMIN B12: Vitamin B-12: 339 pg/mL (ref 211–911)

## 2021-09-27 LAB — PROTEIN ELECTROPHORESIS, SERUM, WITH REFLEX
Albumin ELP: 4 g/dL (ref 3.8–4.8)
Alpha 1: 0.3 g/dL (ref 0.2–0.3)
Alpha 2: 0.6 g/dL (ref 0.5–0.9)
Beta 2: 0.2 g/dL (ref 0.2–0.5)
Beta Globulin: 0.4 g/dL (ref 0.4–0.6)
Gamma Globulin: 0.7 g/dL — ABNORMAL LOW (ref 0.8–1.7)
Total Protein: 6.1 g/dL (ref 6.1–8.1)

## 2021-09-27 LAB — TEST AUTHORIZATION

## 2021-09-27 LAB — ANA: Anti Nuclear Antibody (ANA): POSITIVE — AB

## 2021-09-27 LAB — KAPPA/LAMBDA LIGHT CHAINS
Kappa free light chain: 21 mg/L — ABNORMAL HIGH (ref 3.3–19.4)
Kappa:Lambda Ratio: 1.74 — ABNORMAL HIGH (ref 0.26–1.65)
Lambda Free Lght Chn: 12.1 mg/L (ref 5.7–26.3)

## 2021-09-27 LAB — ANTI-NUCLEAR AB-TITER (ANA TITER): ANA Titer 1: 1:320 {titer} — ABNORMAL HIGH

## 2021-09-27 LAB — IFE INTERPRETATION

## 2021-09-28 ENCOUNTER — Ambulatory Visit (INDEPENDENT_AMBULATORY_CARE_PROVIDER_SITE_OTHER): Payer: Medicare Other | Admitting: Family Medicine

## 2021-09-28 ENCOUNTER — Encounter: Payer: Self-pay | Admitting: Family Medicine

## 2021-09-28 VITALS — BP 126/72 | HR 76 | Temp 97.8°F | Ht 66.0 in | Wt 129.0 lb

## 2021-09-28 DIAGNOSIS — Z9889 Other specified postprocedural states: Secondary | ICD-10-CM

## 2021-09-28 DIAGNOSIS — E559 Vitamin D deficiency, unspecified: Secondary | ICD-10-CM

## 2021-09-28 DIAGNOSIS — R768 Other specified abnormal immunological findings in serum: Secondary | ICD-10-CM

## 2021-09-28 DIAGNOSIS — E038 Other specified hypothyroidism: Secondary | ICD-10-CM

## 2021-09-28 DIAGNOSIS — E611 Iron deficiency: Secondary | ICD-10-CM | POA: Diagnosis not present

## 2021-09-28 DIAGNOSIS — D472 Monoclonal gammopathy: Secondary | ICD-10-CM | POA: Diagnosis not present

## 2021-09-28 DIAGNOSIS — G3184 Mild cognitive impairment, so stated: Secondary | ICD-10-CM

## 2021-09-28 DIAGNOSIS — N1832 Chronic kidney disease, stage 3b: Secondary | ICD-10-CM | POA: Diagnosis not present

## 2021-09-28 DIAGNOSIS — E538 Deficiency of other specified B group vitamins: Secondary | ICD-10-CM

## 2021-09-28 DIAGNOSIS — I4821 Permanent atrial fibrillation: Secondary | ICD-10-CM

## 2021-09-28 DIAGNOSIS — I7300 Raynaud's syndrome without gangrene: Secondary | ICD-10-CM

## 2021-09-28 DIAGNOSIS — R2689 Other abnormalities of gait and mobility: Secondary | ICD-10-CM | POA: Diagnosis not present

## 2021-09-28 DIAGNOSIS — L299 Pruritus, unspecified: Secondary | ICD-10-CM

## 2021-09-28 DIAGNOSIS — Z8679 Personal history of other diseases of the circulatory system: Secondary | ICD-10-CM

## 2021-09-28 MED ORDER — CYANOCOBALAMIN 1000 MCG/ML IJ SOLN
1000.0000 ug | Freq: Once | INTRAMUSCULAR | Status: AC
  Administered 2021-09-28: 1000 ug via INTRAMUSCULAR

## 2021-09-28 NOTE — Patient Instructions (Addendum)
Labs today.  We will refer you to rheumatologist for evaluation of persistently elevated ANA levels.

## 2021-09-28 NOTE — Progress Notes (Unsigned)
Patient ID: Sierra Kaiser, female    DOB: 1933-06-06, 88 y.o.   MRN: [id number]  This visit was conducted in person.  BP 126/72   Pulse 76   Temp 97.8 F (36.6 C) (Temporal)   Ht '5\' 6"'$  (1.676 m)   Wt 129 lb (58.5 kg)   SpO2 97%   BMI 20.82 kg/m    CC: CPE - converted to acute visit as last CPE was 11/02/2020  Subjective:   HPI: Sierra Kaiser is a 88 y.o. female presenting on 09/28/2021 for Follow-up   Still too soon for physical. Will return for CPE.   Saw health advisor 04/2021 for medicare wellness visit. Note reviewed. Cognitive assessment declined by patient  Ongoing unsteadiness with ambulation "like I'm drunk".  Dyspnea actually improved - last saw pulm 2019. At that time told had mild-mod COPD.   Stays itchy throughout body, worse when she gets hot.  Notes finger tips get numb especially left pinky. Notes some peeling of L pinky tip, fingers sometimes feel tight.  No fevers/chills, joint pains, skin rash, red eyes, oral lesions.   Notes mouth/tongue gets dry at night time. Feels skin gets dry.      Relevant past medical, surgical, family and social history reviewed and updated as indicated. Interim medical history since our last visit reviewed. Allergies and medications reviewed and updated. Outpatient Medications Prior to Visit  Medication Sig Dispense Refill   Cholecalciferol (VITAMIN D3) 25 MCG (1000 UT) CAPS Take 1 capsule (1,000 Units total) by mouth daily. 30 capsule    cyanocobalamin (,VITAMIN B-12,) 1000 MCG/ML injection Inject 1 mL (1,000 mcg total) into the muscle every 30 (thirty) days.     ezetimibe (ZETIA) 10 MG tablet Take 1 tablet (10 mg total) by mouth daily. 90 tablet 2   fluticasone (FLONASE) 50 MCG/ACT nasal spray Place 2 sprays into both nostrils daily. 16 g 3   furosemide (LASIX) 20 MG tablet TAKE 1 TABLET (20 MG TOTAL) BY MOUTH AS NEEDED FOR FLUID OR EDEMA. 30 tablet 3   Polyethyl Glycol-Propyl Glycol (SYSTANE OP) Place 1 drop into both eyes  daily as needed (dry eyes).     rosuvastatin (CRESTOR) 20 MG tablet Take 1 tablet (20 mg total) by mouth every other day. 45 tablet 2   XARELTO 15 MG TABS tablet TAKE 1 TABLET BY MOUTH DAILY WITH SUPPER 90 tablet 1   No facility-administered medications prior to visit.     Per HPI unless specifically indicated in ROS section below Review of Systems  Objective:  BP 126/72   Pulse 76   Temp 97.8 F (36.6 C) (Temporal)   Ht '5\' 6"'$  (1.676 m)   Wt 129 lb (58.5 kg)   SpO2 97%   BMI 20.82 kg/m   Wt Readings from Last 3 Encounters:  09/28/21 129 lb (58.5 kg)  06/29/21 128 lb 8 oz (58.3 kg)  05/03/21 130 lb (59 kg)      Physical Exam Vitals and nursing note reviewed.  Constitutional:      Appearance: Normal appearance. She is not ill-appearing.  Neurological:     Mental Status: She is alert.  Psychiatric:        Mood and Affect: Mood normal.        Behavior: Behavior normal.       Results for orders placed or performed in visit on 09/21/21  Serum protein electrophoresis with reflex  Result Value Ref Range   Total Protein 6.1 6.1 - 8.1  g/dL   Albumin ELP 4.0 3.8 - 4.8 g/dL   Alpha 1 0.3 0.2 - 0.3 g/dL   Alpha 2 0.6 0.5 - 0.9 g/dL   Beta Globulin 0.4 0.4 - 0.6 g/dL   Beta 2 0.2 0.2 - 0.5 g/dL   Gamma Globulin 0.7 (L) 0.8 - 1.7 g/dL   Abnormal Protein Band1 NOTE NONE DETECTED g/dL   SPE Interp.    ANA  Result Value Ref Range   Anti Nuclear Antibody (ANA) POSITIVE (A) NEGATIVE  T4, free  Result Value Ref Range   Free T4 0.69 0.60 - 1.60 ng/dL  TSH  Result Value Ref Range   TSH 5.86 (H) 0.35 - 5.50 uIU/mL  Microalbumin / creatinine urine ratio  Result Value Ref Range   Microalb, Ur 7.6 (H) 0.0 - 1.9 mg/dL   Creatinine,U 204.7 mg/dL   Microalb Creat Ratio 3.7 0.0 - 30.0 mg/g  CBC with Differential/Platelet  Result Value Ref Range   WBC 5.0 4.0 - 10.5 K/uL   RBC 4.32 3.87 - 5.11 Mil/uL   Hemoglobin 14.1 12.0 - 15.0 g/dL   HCT 41.2 36.0 - 46.0 %   MCV 95.4 78.0 -  100.0 fl   MCHC 34.2 30.0 - 36.0 g/dL   RDW 13.2 11.5 - 15.5 %   Platelets 161.0 150.0 - 400.0 K/uL   Neutrophils Relative % 50.3 43.0 - 77.0 %   Lymphocytes Relative 35.3 12.0 - 46.0 %   Monocytes Relative 9.0 3.0 - 12.0 %   Eosinophils Relative 3.3 0.0 - 5.0 %   Basophils Relative 2.1 0.0 - 3.0 %   Neutro Abs 2.5 1.4 - 7.7 K/uL   Lymphs Abs 1.7 0.7 - 4.0 K/uL   Monocytes Absolute 0.4 0.1 - 1.0 K/uL   Eosinophils Absolute 0.2 0.0 - 0.7 K/uL   Basophils Absolute 0.1 0.0 - 0.1 K/uL  Comprehensive metabolic panel  Result Value Ref Range   Sodium 141 135 - 145 mEq/L   Potassium 4.1 3.5 - 5.1 mEq/L   Chloride 106 96 - 112 mEq/L   CO2 28 19 - 32 mEq/L   Glucose, Bld 66 (L) 70 - 99 mg/dL   BUN 24 (H) 6 - 23 mg/dL   Creatinine, Ser 1.18 0.40 - 1.20 mg/dL   Total Bilirubin 0.7 0.2 - 1.2 mg/dL   Alkaline Phosphatase 61 39 - 117 U/L   AST 14 0 - 37 U/L   ALT 9 0 - 35 U/L   Total Protein 6.4 6.0 - 8.3 g/dL   Albumin 4.5 3.5 - 5.2 g/dL   GFR 41.85 (L) >60.00 mL/min   Calcium 9.4 8.4 - 10.5 mg/dL  VITAMIN D 25 Hydroxy (Vit-D Deficiency, Fractures)  Result Value Ref Range   VITD 30.91 30.00 - 100.00 ng/mL  IBC panel  Result Value Ref Range   Iron 102 42 - 145 ug/dL   Transferrin 268.0 212.0 - 360.0 mg/dL   Saturation Ratios 27.2 20.0 - 50.0 %   TIBC 375.2 250.0 - 450.0 mcg/dL  Ferritin  Result Value Ref Range   Ferritin 19.7 10.0 - 291.0 ng/mL  Vitamin B12  Result Value Ref Range   Vitamin B-12 339 211 - 911 pg/mL  IFE Interpretation  Result Value Ref Range   Immunofix Electr Int    Kappa/lambda light chains  Result Value Ref Range   Kappa free light chain 21.0 (H) 3.3 - 19.4 mg/L   Lambda Free Lght Chn 12.1 5.7 - 26.3 mg/L   Kappa:Lambda  Ratio 1.74 (H) 0.26 - 1.65  Anti-nuclear ab-titer (ANA titer)  Result Value Ref Range   ANA Titer 1 1:320 (H) titer   ANA Pattern 1 Nuclear, Homogeneous (A)   TEST AUTHORIZATION  Result Value Ref Range   TEST NAME: KAPPA/LAMBDA LIGHT  CHAINS    TEST CODE: [id number]    CLIENT CONTACT: TERRI WALSH    REPORT ALWAYS MESSAGE SIGNATURE      Assessment & Plan:   Problem List Items Addressed This Visit   None    No orders of the defined types were placed in this encounter.  No orders of the defined types were placed in this encounter.    Patient Instructions  Labs today.  We will refer you to rheumatologist for evaluation of persistently elevated ANA levels.   Follow up plan: No follow-ups on file.  [name], MD

## 2021-09-29 DIAGNOSIS — R2689 Other abnormalities of gait and mobility: Secondary | ICD-10-CM | POA: Insufficient documentation

## 2021-09-29 LAB — SJOGREN'S SYNDROME ANTIBODS(SSA + SSB)
SSA (Ro) (ENA) Antibody, IgG: <1 AI
SSB (La) (ENA) Antibody, IgG: <1 AI

## 2021-09-29 NOTE — Assessment & Plan Note (Addendum)
Chronic, previously improved with iron infusion.

## 2021-09-29 NOTE — Assessment & Plan Note (Signed)
Low normal readings - continue monthly b12 shots (one today)

## 2021-09-29 NOTE — Assessment & Plan Note (Signed)
Chronic, overall stable period.

## 2021-09-29 NOTE — Assessment & Plan Note (Addendum)
H/o this by previous neuro eval. She feels well off aricept and lexapro, has decided not to return to neuro at this time.

## 2021-09-29 NOTE — Assessment & Plan Note (Addendum)
Persistently faintly positive IgG (lambda) gammopathy.  Rpt free light chains stable with mildly elevated kappa light chains and ratio.  Has previously seen heme/onc Janese Banks) (2021).

## 2021-09-29 NOTE — Assessment & Plan Note (Signed)
Chronic, stable. Reviewing chart, she has had stable CKD stage 3 since at least 2018.

## 2021-09-29 NOTE — Assessment & Plan Note (Addendum)
Ferritin levels low normal, other iron levels and CBC normal. Remains off oral iron. Will continue to monitor.  S/p feraheme infusions 2019 and 2021.

## 2021-09-29 NOTE — Assessment & Plan Note (Addendum)
Longstanding ANA positive with elevated titers. SLE testing previously negative (anti-dsDNA, anti-RNP, C3/C4 normal 11/2019). Today describes ongoing dry mouth and eye symptoms - will check for Sjogren disease. Will refer to rheumatology for persistently elevated ANA. Pt agrees with plan.

## 2021-09-29 NOTE — Assessment & Plan Note (Signed)
Chronic issue, unclear cause.

## 2021-09-29 NOTE — Assessment & Plan Note (Signed)
Stable borderline hypothyroidism. Desires to remain off treatment as previous levothyroxine replacement had no effect.

## 2021-09-29 NOTE — Assessment & Plan Note (Signed)
Low normal readings - continue oral replacement

## 2021-09-29 NOTE — Assessment & Plan Note (Addendum)
Stable period on daily xarelto anticoagulation.  S/p ablation

## 2021-11-03 DIAGNOSIS — R768 Other specified abnormal immunological findings in serum: Secondary | ICD-10-CM | POA: Diagnosis not present

## 2021-11-03 DIAGNOSIS — I7300 Raynaud's syndrome without gangrene: Secondary | ICD-10-CM | POA: Diagnosis not present

## 2021-11-09 ENCOUNTER — Ambulatory Visit (INDEPENDENT_AMBULATORY_CARE_PROVIDER_SITE_OTHER): Payer: Medicare Other

## 2021-11-09 DIAGNOSIS — I428 Other cardiomyopathies: Secondary | ICD-10-CM | POA: Diagnosis not present

## 2021-11-09 LAB — CUP PACEART REMOTE DEVICE CHECK
Battery Remaining Longevity: 55 mo
Battery Remaining Percentage: 40 %
Battery Voltage: 2.95 V
Brady Statistic RV Percent Paced: 99 %
Date Time Interrogation Session: 20230906020018
Implantable Lead Implant Date: 20160217
Implantable Lead Location: 753860
Implantable Lead Model: 1948
Implantable Pulse Generator Implant Date: 20160217
Lead Channel Impedance Value: 590 Ohm
Lead Channel Pacing Threshold Amplitude: 0.625 V
Lead Channel Pacing Threshold Pulse Width: 0.4 ms
Lead Channel Sensing Intrinsic Amplitude: 8.4 mV
Lead Channel Setting Pacing Amplitude: 0.875
Lead Channel Setting Pacing Pulse Width: 0.4 ms
Lead Channel Setting Sensing Sensitivity: 2.5 mV
Pulse Gen Model: 2240
Pulse Gen Serial Number: 3050195

## 2021-11-28 NOTE — Progress Notes (Signed)
Remote pacemaker transmission.   

## 2021-12-23 ENCOUNTER — Other Ambulatory Visit: Payer: Self-pay | Admitting: Cardiovascular Disease

## 2021-12-23 ENCOUNTER — Ambulatory Visit (INDEPENDENT_AMBULATORY_CARE_PROVIDER_SITE_OTHER): Payer: Medicare Other

## 2021-12-23 DIAGNOSIS — E538 Deficiency of other specified B group vitamins: Secondary | ICD-10-CM

## 2021-12-23 MED ORDER — CYANOCOBALAMIN 1000 MCG/ML IJ SOLN
1000.0000 ug | Freq: Once | INTRAMUSCULAR | Status: AC
  Administered 2021-12-23: 1000 ug via INTRAMUSCULAR

## 2021-12-23 NOTE — Progress Notes (Signed)
Per orders of Dr. Danise Mina, injection of monthly B12-missed a couple of months- given by Kris Mouton. Patient tolerated injection well.

## 2021-12-23 NOTE — Telephone Encounter (Signed)
Prescription refill request for Xarelto received.   Indication: afib  Last office visit: [place], 04/13/2021 Weight: 58.5kg Age: 88 yo  Scr: 1.18, 09/21/2021 CrCl: 32 ml/min   Refill sent.

## 2022-01-24 ENCOUNTER — Ambulatory Visit: Payer: Medicare Other

## 2022-01-25 ENCOUNTER — Other Ambulatory Visit: Payer: Self-pay | Admitting: Family Medicine

## 2022-02-01 ENCOUNTER — Telehealth: Payer: Self-pay

## 2022-02-01 ENCOUNTER — Ambulatory Visit (INDEPENDENT_AMBULATORY_CARE_PROVIDER_SITE_OTHER): Payer: Medicare Other

## 2022-02-01 DIAGNOSIS — E538 Deficiency of other specified B group vitamins: Secondary | ICD-10-CM | POA: Diagnosis not present

## 2022-02-01 DIAGNOSIS — N3941 Urge incontinence: Secondary | ICD-10-CM

## 2022-02-01 MED ORDER — CYANOCOBALAMIN 1000 MCG/ML IJ SOLN
1000.0000 ug | Freq: Once | INTRAMUSCULAR | Status: AC
  Administered 2022-02-01: 1000 ug via INTRAMUSCULAR

## 2022-02-01 NOTE — Progress Notes (Signed)
Per orders of Dr. Danise Mina, injection of Vitamin B 12 given in right deltoid given by Ozzie Hoyle. Patient tolerated injection well.

## 2022-02-01 NOTE — Telephone Encounter (Signed)
Pt came to office for B 12 injection which pt received and pt said her sister gave her oxybutynin Chloride ? Mg and pt took one in AM and one in PM one time only to help with urgency of urination. Pt wants to know if Dr Darnell Level thinks OK for pt to take and if so please send rx to [facility]. And pt request cb after reviewed by Dr Robbi Garter note to Dr Darnell Level and Danise Mina pool.

## 2022-02-03 DIAGNOSIS — N3941 Urge incontinence: Secondary | ICD-10-CM | POA: Insufficient documentation

## 2022-02-03 NOTE — Telephone Encounter (Signed)
I'd be hesitant to prescribe oxybutynin as it can affect cognition.  Could consider medicine called myrbetriq.  Would offer OV to review options.

## 2022-02-03 NOTE — Telephone Encounter (Signed)
Spoke with pt relaying Dr. Synthia Innocent message.  Pt verbalizes understanding and scheduled OV on 02/06/22 to discuss tx options.

## 2022-02-06 ENCOUNTER — Ambulatory Visit (INDEPENDENT_AMBULATORY_CARE_PROVIDER_SITE_OTHER): Payer: Medicare Other | Admitting: Family Medicine

## 2022-02-06 ENCOUNTER — Encounter: Payer: Self-pay | Admitting: Family Medicine

## 2022-02-06 VITALS — BP 130/62 | HR 73 | Temp 97.8°F | Ht 66.0 in | Wt 134.8 lb

## 2022-02-06 DIAGNOSIS — E538 Deficiency of other specified B group vitamins: Secondary | ICD-10-CM | POA: Diagnosis not present

## 2022-02-06 DIAGNOSIS — G3184 Mild cognitive impairment, so stated: Secondary | ICD-10-CM | POA: Diagnosis not present

## 2022-02-06 DIAGNOSIS — R0609 Other forms of dyspnea: Secondary | ICD-10-CM | POA: Diagnosis not present

## 2022-02-06 DIAGNOSIS — R768 Other specified abnormal immunological findings in serum: Secondary | ICD-10-CM | POA: Diagnosis not present

## 2022-02-06 DIAGNOSIS — N3941 Urge incontinence: Secondary | ICD-10-CM

## 2022-02-06 DIAGNOSIS — H8109 Meniere's disease, unspecified ear: Secondary | ICD-10-CM | POA: Diagnosis not present

## 2022-02-06 DIAGNOSIS — Z950 Presence of cardiac pacemaker: Secondary | ICD-10-CM | POA: Diagnosis not present

## 2022-02-06 LAB — POC URINALSYSI DIPSTICK (AUTOMATED)
Glucose, UA: NEGATIVE
Nitrite, UA: NEGATIVE
Protein, UA: POSITIVE — AB
Spec Grav, UA: 1.025 (ref 1.010–1.025)
Urobilinogen, UA: 0.2 E.U./dL
pH, UA: 5.5 (ref 5.0–8.0)

## 2022-02-06 MED ORDER — MIRABEGRON ER 25 MG PO TB24: 25.0000 mg | ORAL_TABLET | Freq: Every day | ORAL | 1 refills | Status: DC

## 2022-02-06 MED ORDER — CEPHALEXIN 500 MG PO CAPS: 500.0000 mg | ORAL_CAPSULE | Freq: Three times a day (TID) | ORAL | 0 refills | Status: DC

## 2022-02-06 NOTE — Assessment & Plan Note (Signed)
Chronic issue. Has seen pulm and cards. Had pulm rehab 2018.  Thought multifactorial.

## 2022-02-06 NOTE — Patient Instructions (Addendum)
Urine test today suspicious for infection. Start keflex antibiotic '500mg'$  three time daily for 7 days. I've sent a urine culture and we will be in touch with results.  Price out [employer] '25mg'$  daily - sent to pharmacy - for urinary incontinence.  Return in 1-2 months for wellness visit/physical.   Urinary Incontinence Urinary incontinence refers to a condition in which a person is unable to control where and when to pass urine. A person with this condition will urinate involuntarily. This means that the person urinates when he or she does not mean to. What are the causes? This condition may be caused by: Medicines. Infections. Constipation. Overactive bladder muscles. Weak bladder muscles. Weak pelvic floor muscles. These muscles provide support for the bladder, intestine, and, in women, the uterus. Enlarged prostate in men. The prostate is a gland near the bladder. When it gets too big, it can pinch the urethra. With the urethra blocked, the bladder can weaken and lose the ability to empty properly. Surgery. Emotional factors, such as anxiety, stress, or post-traumatic stress disorder (PTSD). Spinal cord injury, nerve injury, or other neurological conditions. Pelvic organ prolapse. This happens in women when organs move out of place and into the vagina. This movement can prevent the bladder and urethra from working properly. What increases the risk? The following factors may make you more likely to develop this condition: Age. The older you are, the higher the risk. Obesity. Being physically inactive. Pregnancy and childbirth. Menopause. Diseases that affect the nerves or spinal cord. Long-term, or chronic, coughing. This can increase pressure on the bladder and pelvic floor muscles. What are the signs or symptoms? Symptoms may vary depending on the type of urinary incontinence you have. They include: A sudden urge to urinate, and passing urine involuntarily before you can get to a  bathroom (urge incontinence). Suddenly passing urine when doing activities that force urine to pass, such as coughing, laughing, exercising, or sneezing (stress incontinence). Needing to urinate often but urinating only a small amount, or constantly dribbling urine (overflow incontinence). Urinating because you cannot get to the bathroom in time due to a physical disability, such as arthritis or injury, or due to a communication or thinking problem, such as Alzheimer's disease (functional incontinence). How is this diagnosed? This condition may be diagnosed based on: Your medical history. A physical exam. Tests, such as: Urine tests. X-rays of your kidney and bladder. Ultrasound. CT scan. Cystoscopy. In this procedure, a health care provider inserts a tube with a light and camera (cystoscope) through the urethra and into the bladder to check for problems. Urodynamic testing. These tests assess how well the bladder, urethra, and sphincter can store and release urine. There are different types of urodynamic tests, and they vary depending on what the test is measuring. To help diagnose your condition, your health care provider may recommend that you keep a log of when you urinate and how much you urinate. How is this treated? Treatment for this condition depends on the type of incontinence that you have and its cause. Treatment may include: Lifestyle changes, such as: Quitting smoking. Maintaining a healthy weight. Staying active. Try to get 150 minutes of moderate-intensity exercise every week. Ask your health care provider which activities are safe for you. Eating a healthy diet. Avoid high-fat foods, like fried foods. Avoid refined carbohydrates like white bread and white rice. Limit how much alcohol and caffeine you drink. Increase your fiber intake. Healthy sources of fiber include beans, whole grains, and fresh fruits  and vegetables. Behavioral changes, such as: Pelvic floor muscle  exercises. Bladder training, such as lengthening the amount of time between bathroom breaks, or using the bathroom at regular intervals. Using techniques to suppress bladder urges. This can include distraction techniques or controlled breathing exercises. Medicines, such as: Medicines to relax the bladder muscles and prevent bladder spasms. Medicines to help slow or prevent the growth of a man's prostate. Botox injections. These can help relax the bladder muscles. Treatments, such as: Using pulses of electricity to help change bladder reflexes (electrical nerve stimulation). For women, using a medical device to prevent urine leaks. This is a small, tampon-like, disposable device that is inserted into the urethra. Injecting collagen or carbon beads (bulking agents) into the urinary sphincter. These can help thicken tissue and close the bladder opening. Surgery. Follow these instructions at home: Lifestyle Limit alcohol and caffeine. These can fill your bladder quickly and irritate it. Keep yourself clean to help prevent odors and skin damage. Ask your health care provider about special skin creams and cleansers that can protect the skin from urine. Consider wearing pads or adult diapers. Make sure to change them regularly, and always change them right after experiencing incontinence. General instructions Take over-the-counter and prescription medicines only as told by your health care provider. Use the bathroom about every 3-4 hours, even if you do not feel the need to urinate. Try to empty your bladder completely every time. After urinating, wait a minute. Then try to urinate again. Make sure you are in a relaxed position while urinating. If your incontinence is caused by nerve problems, keep a log of the medicines you take and the times you go to the bathroom. Keep all follow-up visits. This is important. Where to find more information [employer] of Diabetes and Digestive and Kidney  Diseases: [employer] Association: www.urologyhealth.org Contact a health care provider if: You have pain that gets worse. Your incontinence gets worse. Get help right away if: You have a fever or chills. You are unable to urinate. You have redness in your groin area or down your legs. Summary Urinary incontinence refers to a condition in which a person is unable to control where and when to pass urine. This condition may be caused by medicines, infection, weak bladder muscles, weak pelvic floor muscles, enlargement of the prostate (in men), or surgery. Factors such as older age, obesity, pregnancy and childbirth, menopause, neurological diseases, and chronic coughing may increase your risk for developing this condition. Types of urinary incontinence include urge incontinence, stress incontinence, overflow incontinence, and functional incontinence. This condition is usually treated first with lifestyle and behavioral changes, such as quitting smoking, eating a healthier diet, and doing regular pelvic floor exercises. Other treatment options include medicines, bulking agents, medical devices, electrical nerve stimulation, or surgery. This information is not intended to replace advice given to you by your health care provider. Make sure you discuss any questions you have with your health care provider. Document Revised: 09/26/2019 Document Reviewed: 09/26/2019 Elsevier Patient Education  [employer].

## 2022-02-06 NOTE — Assessment & Plan Note (Signed)
H/o this - consider hctz trial.

## 2022-02-06 NOTE — Assessment & Plan Note (Signed)
Describes ongoing urinary urge incontinence.  Oxybutynin may have helped urinary symptoms but she thinks she didn't feel well while on this medication. 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 Discussed mrybetriq - will trial this after r/o UTI.  UA/micro suspicious for infection but with large epithelial cells as well ?contamination - start keflex '500mg'$  TID and await UCx.

## 2022-02-06 NOTE — Assessment & Plan Note (Signed)
Saw rheum Posey Pronto), thought false positive ANA elevation.

## 2022-02-06 NOTE — Progress Notes (Signed)
Patient ID: Sierra Kaiser, female    DOB: [birth year], 88 y.o.   MRN: [id number]  This visit was conducted in person.  BP 130/62   Pulse 73   Temp 97.8 F (36.6 C) (Temporal)   Ht '5\' 6"'$  (1.676 m)   Wt 134 lb 12.8 oz (61.1 kg)   SpO2 95%   BMI 21.76 kg/m    CC: discuss urinary incontience Subjective:   HPI: Sierra Kaiser is a 88 y.o. female presenting on 02/06/2022 for Urinary Urgency (Here to discuss tx options other than oxybutynin.  )   Today she's feeling "terrible". Stays short winded, unsteady with imbalance - all chronic, s/p cards and pulm eval. Sitting and resting for 5 minutes resolves symptoms.   She notes 1-2 years of urinary urgency, frequency Q3 hours, she has urge incontinence. No dysuria, hematuria, abd pain, flank pain, fevers/chills or nausea.  Nocturia x3-4. No night time accidents, doesn't wear a pad at night.  She wears a pad during the day when she goes out.  No stress incontinence symptoms.   Tried sister's oxybutynin with benefit. She may have felt worse on this medicine however.   High titer positive ANA - saw rheumatology Dr Posey Pronto 10/2021, note reviewed. Thought false positive ANA. Further testing per report was normal. Reviewing chart, initial dRVVT mix elevated but confirmatory testing normal. C3, C4, anti-centromere B Ab, anticardiolipin Ab, antiscleroderma 70 Ab, anti ds-DNA Ab, anti-RNP and Smith Ab normal.  She mentions family members have been saying how mean she is.   Last B12 shot 02/01/2022.      Relevant past medical, surgical, family and social history reviewed and updated as indicated. Interim medical history since our last visit reviewed. Allergies and medications reviewed and updated. Outpatient Medications Prior to Visit  Medication Sig Dispense Refill   Cholecalciferol (VITAMIN D3) 25 MCG (1000 UT) CAPS Take 1 capsule (1,000 Units total) by mouth daily. 30 capsule    cyanocobalamin (,VITAMIN B-12,) 1000 MCG/ML injection Inject 1 mL  (1,000 mcg total) into the muscle every 30 (thirty) days.     ezetimibe (ZETIA) 10 MG tablet Take 1 tablet (10 mg total) by mouth daily. 90 tablet 2   fluticasone (FLONASE) 50 MCG/ACT nasal spray SPRAY 2 SPRAYS INTO EACH NOSTRIL EVERY DAY 16 mL 2   furosemide (LASIX) 20 MG tablet TAKE 1 TABLET (20 MG TOTAL) BY MOUTH AS NEEDED FOR FLUID OR EDEMA. 30 tablet 3   Polyethyl Glycol-Propyl Glycol (SYSTANE OP) Place 1 drop into both eyes daily as needed (dry eyes).     rosuvastatin (CRESTOR) 20 MG tablet Take 1 tablet (20 mg total) by mouth every other day. 45 tablet 2   XARELTO 15 MG TABS tablet TAKE 1 TABLET BY MOUTH DAILY WITH SUPPER 90 tablet 1   No facility-administered medications prior to visit.     Per HPI unless specifically indicated in ROS section below Review of Systems  Objective:  BP 130/62   Pulse 73   Temp 97.8 F (36.6 C) (Temporal)   Ht '5\' 6"'$  (1.676 m)   Wt 134 lb 12.8 oz (61.1 kg)   SpO2 95%   BMI 21.76 kg/m   Wt Readings from Last 3 Encounters:  02/06/22 134 lb 12.8 oz (61.1 kg)  09/28/21 129 lb (58.5 kg)  06/29/21 128 lb 8 oz (58.3 kg)      Physical Exam Vitals and nursing note reviewed.  Constitutional:      Appearance: Normal appearance. She is  not ill-appearing.  HENT:     Head: Normocephalic and atraumatic.  Eyes:     Extraocular Movements: Extraocular movements intact.     Pupils: Pupils are equal, round, and reactive to light.  Cardiovascular:     Rate and Rhythm: Normal rate and regular rhythm.     Pulses: Normal pulses.     Heart sounds: Murmur heard.  Pulmonary:     Effort: Pulmonary effort is normal. No respiratory distress.     Breath sounds: Normal breath sounds. No wheezing, rhonchi or rales.  Abdominal:     General: Bowel sounds are normal. There is no distension.     Palpations: Abdomen is soft. There is no mass.     Tenderness: There is no abdominal tenderness. There is no right CVA tenderness, left CVA tenderness, guarding or rebound.      Hernia: No hernia is present.  Musculoskeletal:     Cervical back: Normal range of motion and neck supple.     Right lower leg: No edema.     Left lower leg: No edema.  Skin:    General: Skin is warm and dry.     Findings: No rash.  Neurological:     Mental Status: She is alert.  Psychiatric:        Mood and Affect: Mood normal.        Behavior: Behavior normal.       Results for orders placed or performed in visit on 02/06/22  POCT Urinalysis Dipstick (Automated)  Result Value Ref Range   Color, UA yellow    Clarity, UA clear    Glucose, UA Negative Negative   Bilirubin, UA 1+    Ketones, UA +/-    Spec Grav, UA 1.025 1.010 - 1.025   Blood, UA 1+    pH, UA 5.5 5.0 - 8.0   Protein, UA Positive (A) Negative   Urobilinogen, UA 0.2 0.2 or 1.0 E.U./dL   Nitrite, UA negative    Leukocytes, UA Small (1+) (A) Negative    Assessment & Plan:   Problem List Items Addressed This Visit       Unprioritized   Chronic dyspnea    Chronic issue. Has seen pulm and cards. Had pulm rehab 2018.  Thought multifactorial.       Meniere's disease    H/o this - consider hctz trial.       Low serum vitamin B12    Continues monthly B12 shots, latest 02/01/2022.       Positive ANA (antinuclear antibody)    Saw rheum Posey Pronto), thought false positive ANA elevation.       S/P placement of cardiac pacemaker   Mild cognitive impairment with memory loss    Consider retrial aricept - last took 2022 but stopped due to no significant benefit noted.        Urge incontinence - Primary    Describes ongoing urinary urge incontinence.  Oxybutynin may have helped urinary symptoms but she thinks she didn't feel well while on this medication. 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 Discussed mrybetriq - will trial this after r/o UTI.  UA/micro suspicious for infection but with large epithelial cells as well ?contamination - start keflex '500mg'$  TID  and await UCx.       Relevant Medications   mirabegron ER (MYRBETRIQ) 25 MG TB24 tablet   Other Relevant Orders   POCT Urinalysis Dipstick (Automated) (Completed)   Urine Culture  Meds ordered this encounter  Medications   mirabegron ER (MYRBETRIQ) 25 MG TB24 tablet    Sig: Take 1 tablet (25 mg total) by mouth daily.    Dispense:  30 tablet    Refill:  1   cephALEXin (KEFLEX) 500 MG capsule    Sig: Take 1 capsule (500 mg total) by mouth 3 (three) times daily.    Dispense:  21 capsule    Refill:  0   Orders Placed This Encounter  Procedures   Urine Culture   POCT Urinalysis Dipstick (Automated)     Patient instructions: Urine test today suspicious for infection. Start keflex antibiotic '500mg'$  three time daily for 7 days. I've sent a urine culture and we will be in touch with results.  Price out [employer] '25mg'$  daily - sent to pharmacy - for urinary incontinence.  Return in 1-2 months for wellness visit/physical.   Follow up plan: No follow-ups on file.  [name], MD

## 2022-02-06 NOTE — Assessment & Plan Note (Addendum)
Continues monthly B12 shots, latest 02/01/2022.

## 2022-02-06 NOTE — Assessment & Plan Note (Signed)
Consider retrial aricept - last took 2022 but stopped due to no significant benefit noted.

## 2022-02-07 LAB — URINE CULTURE
MICRO NUMBER:: 14265671
SPECIMEN QUALITY:: ADEQUATE

## 2022-02-08 ENCOUNTER — Ambulatory Visit (INDEPENDENT_AMBULATORY_CARE_PROVIDER_SITE_OTHER): Payer: Medicare Other

## 2022-02-08 DIAGNOSIS — I428 Other cardiomyopathies: Secondary | ICD-10-CM | POA: Diagnosis not present

## 2022-02-08 LAB — CUP PACEART REMOTE DEVICE CHECK
Battery Remaining Longevity: 52 mo
Battery Remaining Percentage: 38 %
Battery Voltage: 2.95 V
Brady Statistic RV Percent Paced: 98 %
Date Time Interrogation Session: 20231206020014
Implantable Lead Connection Status: 753985
Implantable Lead Implant Date: 20160217
Implantable Lead Location: 753860
Implantable Lead Model: 1948
Implantable Pulse Generator Implant Date: 20160217
Lead Channel Impedance Value: 560 Ohm
Lead Channel Pacing Threshold Amplitude: 0.5 V
Lead Channel Pacing Threshold Pulse Width: 0.4 ms
Lead Channel Sensing Intrinsic Amplitude: 12 mV
Lead Channel Setting Pacing Amplitude: 0.75 V
Lead Channel Setting Pacing Pulse Width: 0.4 ms
Lead Channel Setting Sensing Sensitivity: 2.5 mV
Pulse Gen Model: 2240
Pulse Gen Serial Number: 3050195

## 2022-03-03 ENCOUNTER — Ambulatory Visit: Payer: Medicare Other

## 2022-03-03 NOTE — Progress Notes (Signed)
Remote pacemaker transmission.   

## 2022-03-09 ENCOUNTER — Ambulatory Visit (INDEPENDENT_AMBULATORY_CARE_PROVIDER_SITE_OTHER): Payer: Medicare Other

## 2022-03-09 DIAGNOSIS — E538 Deficiency of other specified B group vitamins: Secondary | ICD-10-CM

## 2022-03-09 MED ORDER — CYANOCOBALAMIN 1000 MCG/ML IJ SOLN
1000.0000 ug | Freq: Once | INTRAMUSCULAR | Status: AC
  Administered 2022-03-09: 1000 ug via INTRAMUSCULAR

## 2022-03-09 NOTE — Progress Notes (Signed)
Per orders of Dr. Damita Dunnings, injection of B-12 given in left deltoid by Ferne Reus. Patient tolerated injection well.

## 2022-03-15 ENCOUNTER — Other Ambulatory Visit: Payer: Self-pay | Admitting: Family Medicine

## 2022-03-15 DIAGNOSIS — Z1231 Encounter for screening mammogram for malignant neoplasm of breast: Secondary | ICD-10-CM

## 2022-03-20 DIAGNOSIS — H2513 Age-related nuclear cataract, bilateral: Secondary | ICD-10-CM | POA: Diagnosis not present

## 2022-04-05 ENCOUNTER — Ambulatory Visit
Admission: RE | Admit: 2022-04-05 | Discharge: 2022-04-05 | Disposition: A | Discharge status: Home / Self Care | Payer: Medicare Other | Source: Ambulatory Visit | Attending: Family Medicine | Admitting: Family Medicine

## 2022-04-05 DIAGNOSIS — Z1231 Encounter for screening mammogram for malignant neoplasm of breast: Secondary | ICD-10-CM | POA: Diagnosis not present

## 2022-04-10 ENCOUNTER — Encounter: Payer: Self-pay | Admitting: Family Medicine

## 2022-04-10 ENCOUNTER — Ambulatory Visit (INDEPENDENT_AMBULATORY_CARE_PROVIDER_SITE_OTHER): Payer: Medicare Other | Admitting: Family Medicine

## 2022-04-10 VITALS — BP 128/64 | HR 83 | Temp 97.9°F | Ht 66.0 in | Wt 137.2 lb

## 2022-04-10 DIAGNOSIS — R0989 Other specified symptoms and signs involving the circulatory and respiratory systems: Secondary | ICD-10-CM | POA: Diagnosis not present

## 2022-04-10 DIAGNOSIS — E785 Hyperlipidemia, unspecified: Secondary | ICD-10-CM

## 2022-04-10 DIAGNOSIS — E038 Other specified hypothyroidism: Secondary | ICD-10-CM

## 2022-04-10 DIAGNOSIS — F419 Anxiety disorder, unspecified: Secondary | ICD-10-CM

## 2022-04-10 DIAGNOSIS — E538 Deficiency of other specified B group vitamins: Secondary | ICD-10-CM | POA: Diagnosis not present

## 2022-04-10 DIAGNOSIS — I7300 Raynaud's syndrome without gangrene: Secondary | ICD-10-CM

## 2022-04-10 DIAGNOSIS — E611 Iron deficiency: Secondary | ICD-10-CM

## 2022-04-10 DIAGNOSIS — I442 Atrioventricular block, complete: Secondary | ICD-10-CM

## 2022-04-10 DIAGNOSIS — M810 Age-related osteoporosis without current pathological fracture: Secondary | ICD-10-CM

## 2022-04-10 DIAGNOSIS — G3184 Mild cognitive impairment, so stated: Secondary | ICD-10-CM | POA: Diagnosis not present

## 2022-04-10 DIAGNOSIS — I340 Nonrheumatic mitral (valve) insufficiency: Secondary | ICD-10-CM

## 2022-04-10 DIAGNOSIS — I4821 Permanent atrial fibrillation: Secondary | ICD-10-CM | POA: Diagnosis not present

## 2022-04-10 DIAGNOSIS — I5032 Chronic diastolic (congestive) heart failure: Secondary | ICD-10-CM

## 2022-04-10 DIAGNOSIS — D472 Monoclonal gammopathy: Secondary | ICD-10-CM | POA: Diagnosis not present

## 2022-04-10 DIAGNOSIS — E559 Vitamin D deficiency, unspecified: Secondary | ICD-10-CM

## 2022-04-10 DIAGNOSIS — N1832 Chronic kidney disease, stage 3b: Secondary | ICD-10-CM

## 2022-04-10 DIAGNOSIS — Z7189 Other specified counseling: Secondary | ICD-10-CM | POA: Diagnosis not present

## 2022-04-10 DIAGNOSIS — H6121 Impacted cerumen, right ear: Secondary | ICD-10-CM

## 2022-04-10 DIAGNOSIS — R0609 Other forms of dyspnea: Secondary | ICD-10-CM

## 2022-04-10 DIAGNOSIS — Z950 Presence of cardiac pacemaker: Secondary | ICD-10-CM

## 2022-04-10 DIAGNOSIS — N3941 Urge incontinence: Secondary | ICD-10-CM

## 2022-04-10 DIAGNOSIS — Z0001 Encounter for general adult medical examination with abnormal findings: Secondary | ICD-10-CM | POA: Diagnosis not present

## 2022-04-10 LAB — COMPREHENSIVE METABOLIC PANEL
ALT: 9 U/L (ref 0–35)
AST: 14 U/L (ref 0–37)
Albumin: 4.5 g/dL (ref 3.5–5.2)
Alkaline Phosphatase: 56 U/L (ref 39–117)
BUN: 25 mg/dL — ABNORMAL HIGH (ref 6–23)
CO2: 27 mEq/L (ref 19–32)
Calcium: 9.8 mg/dL (ref 8.4–10.5)
Chloride: 108 mEq/L (ref 96–112)
Creatinine, Ser: 1.21 mg/dL — ABNORMAL HIGH (ref 0.40–1.20)
GFR: 40.45 mL/min — ABNORMAL LOW (ref >60.00)
Glucose, Bld: 64 mg/dL — ABNORMAL LOW (ref 70–99)
Potassium: 4.5 mEq/L (ref 3.5–5.1)
Sodium: 142 mEq/L (ref 135–145)
Total Bilirubin: 0.6 mg/dL (ref 0.2–1.2)
Total Protein: 6.6 g/dL (ref 6.0–8.3)

## 2022-04-10 LAB — T4, FREE: Free T4: 0.72 ng/dL (ref 0.60–1.60)

## 2022-04-10 LAB — CBC WITH DIFFERENTIAL/PLATELET
Basophils Absolute: 0.1 10*3/uL (ref 0.0–0.1)
Basophils Relative: 0.9 % (ref 0.0–3.0)
Eosinophils Absolute: 0.1 10*3/uL (ref 0.0–0.7)
Eosinophils Relative: 2.3 % (ref 0.0–5.0)
HCT: 38.6 % (ref 36.0–46.0)
Hemoglobin: 13.2 g/dL (ref 12.0–15.0)
Lymphocytes Relative: 25.3 % (ref 12.0–46.0)
Lymphs Abs: 1.5 10*3/uL (ref 0.7–4.0)
MCHC: 34.3 g/dL (ref 30.0–36.0)
MCV: 92.3 fl (ref 78.0–100.0)
Monocytes Absolute: 0.5 10*3/uL (ref 0.1–1.0)
Monocytes Relative: 9.2 % (ref 3.0–12.0)
Neutro Abs: 3.7 10*3/uL (ref 1.4–7.7)
Neutrophils Relative %: 62.3 % (ref 43.0–77.0)
Platelets: 174 10*3/uL (ref 150.0–400.0)
RBC: 4.18 Mil/uL (ref 3.87–5.11)
RDW: 13.2 % (ref 11.5–15.5)
WBC: 5.9 10*3/uL (ref 4.0–10.5)

## 2022-04-10 LAB — FERRITIN: Ferritin: 8.6 ng/mL — ABNORMAL LOW (ref 10.0–291.0)

## 2022-04-10 LAB — LIPID PANEL
Cholesterol: 137 mg/dL (ref 0–200)
HDL: 65.5 mg/dL (ref >39.00)
LDL Cholesterol: 54 mg/dL (ref 0–99)
NonHDL: 71.27
Total CHOL/HDL Ratio: 2
Triglycerides: 84 mg/dL (ref 0.0–149.0)
VLDL: 16.8 mg/dL (ref 0.0–40.0)

## 2022-04-10 LAB — IBC PANEL
Iron: 103 ug/dL (ref 42–145)
Saturation Ratios: 25.5 % (ref 20.0–50.0)
TIBC: 403.2 ug/dL (ref 250.0–450.0)
Transferrin: 288 mg/dL (ref 212.0–360.0)

## 2022-04-10 LAB — MICROALBUMIN / CREATININE URINE RATIO
Creatinine,U: 256.9 mg/dL
Microalb Creat Ratio: 2.1 mg/g (ref 0.0–30.0)
Microalb, Ur: 5.5 mg/dL — ABNORMAL HIGH (ref 0.0–1.9)

## 2022-04-10 LAB — VITAMIN B12: Vitamin B-12: 323 pg/mL (ref 211–911)

## 2022-04-10 LAB — PHOSPHORUS: Phosphorus: 3.5 mg/dL (ref 2.3–4.6)

## 2022-04-10 LAB — VITAMIN D 25 HYDROXY (VIT D DEFICIENCY, FRACTURES): VITD: 29.93 ng/mL — ABNORMAL LOW (ref 30.00–100.00)

## 2022-04-10 LAB — TSH: TSH: 4.12 u[IU]/mL (ref 0.35–5.50)

## 2022-04-10 MED ORDER — MIRABEGRON ER 25 MG PO TB24: 25.0000 mg | ORAL_TABLET | Freq: Every day | ORAL | 3 refills | Status: DC

## 2022-04-10 MED ORDER — CYANOCOBALAMIN 1000 MCG/ML IJ SOLN
1000.0000 ug | Freq: Once | INTRAMUSCULAR | Status: AC
  Administered 2022-04-10: 1000 ug via INTRAMUSCULAR

## 2022-04-10 NOTE — Patient Instructions (Addendum)
Labs today then B12 shot.  Bring [place] a copy of your living will.  Try Myrbetriq '25mg'$  daily for urine incontinence symptoms - sent to pharmacy today.  We will refer you to audiologist for formal hearing evaluation.  We will refer you for neck artery ultrasound in [place].  Check with pharmacy on date of second shingles shot and let us know.   Goal calcium '1200mg'$  daily. Try to get most or all of your calcium from your food--aim for 1000 mg/day for women up to 61 and men up to 70 and 1200 mg/day for women over 75 and men over 70. To figure out dietary calcium: 300 mg/day from all non dairy foods plus 300 mg per cup of milk, other dairy, or fortified juice. Non dairy foods that contain calcium: Kale, oranges, sardines, oatmeal, soy milk/soybeans, salmon, white beans, dried figs, turnip greens, almonds, broccoli, tofu.   Return in 3 months for follow up visit

## 2022-04-10 NOTE — Progress Notes (Unsigned)
Patient ID: RONIKA KELSON, female    DOB: [birth year], 89 y.o.   MRN: [id number]  This visit was conducted in person.  BP 128/64   Pulse 83   Temp 97.9 F (36.6 C) (Temporal)   Ht '5\' 6"'$  (1.676 m)   Wt 137 lb 4 oz (62.3 kg)   SpO2 98%   BMI 22.15 kg/m    CC: CPE Subjective:   HPI: JIYA KISSINGER is a 89 y.o. female presenting on 04/10/2022 for Annual Exam (Last wellness- 05/03/21. )   Did not see health advisor this year.  Last saw health advisor 05/03/2021 for medicare wellness visit.   Hearing Screening   '500Hz'$  '1000Hz'$  '2000Hz'$  '4000Hz'$   Right ear 0 0 0 0  Left ear 40 0 40 0  Comments: Heard practice tone on 40 dBHL in R ear. Pt is aware of decreased hearing.  Vision Screening - Comments:: Last eye exam, 03/2022.  [place] Office Visit from 04/10/2022 in [place] at [facility]  PHQ-2 Total Score 2     Failed hearing screen - notes R>L hearing loss.     04/10/2022   11:49 AM 05/03/2021   11:40 AM 11/02/2020    9:17 AM 10/15/2019   12:15 PM 10/18/2018    3:13 PM  Fall Risk   Falls in the past year? 1 0 0 0 1  Number falls in past yr: 1 0  0 0  Injury with Fall? 0 0  0 1  Risk for fall due to :  No Fall Risks  Medication side effect   Follow up  Falls prevention discussed  Falls evaluation completed;Falls prevention discussed    Continues B12 shots, last dose 03/09/2022.  Last visit 02/2022 we started myrbetriq for ongoing urine incontinence symptoms - she's unsure if she ever took this. Will restart.  She continues vitamin D 1000 IU daily.   Preventative: COLONOSCOPY WITH PROPOFOL 08/29/2017 - TAs, no follow up recommended Alice Reichert, Benay Pike, MD)  Well woman - s/p hysterectomy  Mammogram 04/2022 - [facility] @ [facility].  DEXA 12/2015 - osteopenia. Discussed calcium and vit D dosing as well as regular weight bearing exercise.  DEXA 01/2021 - T -2.5 total R femur, -2.2 AP spine  Lung cancer screening - not eligible  Flu shot yearly  COVID vaccine -  [place] 03/2019 x2, booster x2 Pneumovax 2012, prevnar-13 2016  Tetanus - unsure  zostavax ~2014  shingrix - 12/2020 , unsure if she's had second.  Advanced directive discussion: has at home. Sister [name]) would be HCPOA. Will bring me copy. Full code, but doesn't want prolonged life support if terminal condition.  Seat belt use discussed Sunscreen use discussed. No changing moles on skin.  Non smoker  Alcohol - none Dentist q6 mo  Eye exam - due Bowel - no diarrhea or constipation Bladder - ongoing urge incontinence - wears pad    Lives with husband.  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 Interim medical history since our last visit reviewed. Allergies and medications reviewed and updated. Outpatient Medications Prior to Visit  Medication Sig Dispense Refill   cyanocobalamin (,VITAMIN B-12,) 1000 MCG/ML injection Inject 1 mL (1,000 mcg total) into the muscle every 30 (thirty) days.  fluticasone (FLONASE) 50 MCG/ACT nasal spray SPRAY 2 SPRAYS INTO EACH NOSTRIL EVERY DAY 16 mL 2   furosemide (LASIX) 20 MG tablet TAKE 1 TABLET (20 MG TOTAL) BY MOUTH AS NEEDED FOR FLUID OR EDEMA. 30 tablet 3   Polyethyl Glycol-Propyl Glycol (SYSTANE OP) Place 1 drop into both eyes daily as needed (dry eyes).     XARELTO 15 MG TABS tablet TAKE 1 TABLET BY MOUTH DAILY WITH SUPPER 90 tablet 1   Cholecalciferol (VITAMIN D3) 25 MCG (1000 UT) CAPS Take 1 capsule (1,000 Units total) by mouth daily. (Patient taking differently: Take 2 capsules by mouth daily.) 30 capsule    ezetimibe (ZETIA) 10 MG tablet Take 1 tablet (10 mg total) by mouth daily. 90 tablet 2   mirabegron ER (MYRBETRIQ) 25 MG TB24 tablet Take 1 tablet (25 mg total)  by mouth daily. 30 tablet 1   rosuvastatin (CRESTOR) 20 MG tablet Take 1 tablet (20 mg total) by mouth every other day. 45 tablet 2   cephALEXin (KEFLEX) 500 MG capsule Take 1 capsule (500 mg total) by mouth 3 (three) times daily. 21 capsule 0   No facility-administered medications prior to visit.     Per HPI unless specifically indicated in ROS section below Review of Systems  Constitutional:  Negative for activity change, appetite change, chills, fatigue, fever and unexpected weight change.  HENT:  Negative for hearing loss.   Eyes:  Negative for visual disturbance.  Respiratory:  Positive for shortness of breath (chronic intermittent exertional dyspnea). Negative for cough, chest tightness and wheezing.   Cardiovascular:  Negative for chest pain, palpitations and leg swelling.  Gastrointestinal:  Negative for abdominal distention, abdominal pain, blood in stool, constipation, diarrhea, nausea and vomiting.  Genitourinary:  Negative for difficulty urinating and hematuria.  Musculoskeletal:  Negative for arthralgias, myalgias and neck pain.  Skin:  Negative for rash.  Neurological:  Positive for dizziness. Negative for seizures, syncope and headaches.  Hematological:  Negative for adenopathy. Does not bruise/bleed easily.  Psychiatric/Behavioral:  Negative for dysphoric mood. The patient is not nervous/anxious.     Objective:  BP 128/64   Pulse 83   Temp 97.9 F (36.6 C) (Temporal)   Ht '5\' 6"'$  (1.676 m)   Wt 137 lb 4 oz (62.3 kg)   SpO2 98%   BMI 22.15 kg/m   Wt Readings from Last 3 Encounters:  04/10/22 137 lb 4 oz (62.3 kg)  02/06/22 134 lb 12.8 oz (61.1 kg)  09/28/21 129 lb (58.5 kg)      Physical Exam Vitals and nursing note reviewed.  Constitutional:      Appearance: Normal appearance. She is not ill-appearing.  HENT:     Head: Normocephalic and atraumatic.     Right Ear: Ear canal and external ear normal. There is impacted cerumen.     Left Ear: Tympanic membrane,  ear canal and external ear normal. There is no impacted cerumen.     Nose: Nose normal.     Mouth/Throat:     Mouth: Mucous membranes are moist.     Pharynx: Oropharynx is clear. No oropharyngeal exudate or posterior oropharyngeal erythema.  Eyes:     General:        Right eye: No discharge.        Left eye: No discharge.     Extraocular Movements: Extraocular movements intact.     Conjunctiva/sclera: Conjunctivae normal.     Pupils: Pupils are equal, round, and reactive to light.  Neck:  Thyroid: No thyroid mass or thyromegaly.     Vascular: Carotid bruit (mild L bruit) present.  Cardiovascular:     Rate and Rhythm: Normal rate and regular rhythm.     Pulses: Normal pulses.     Heart sounds: Normal heart sounds. No murmur heard. Pulmonary:     Effort: Pulmonary effort is normal. No respiratory distress.     Breath sounds: Normal breath sounds. No wheezing, rhonchi or rales.  Abdominal:     General: Bowel sounds are normal. There is no distension.     Palpations: Abdomen is soft. There is no mass.     Tenderness: There is no abdominal tenderness. There is no guarding or rebound.     Hernia: No hernia is present.  Musculoskeletal:     Cervical back: Normal range of motion and neck supple. No rigidity.     Right lower leg: No edema.     Left lower leg: No edema.  Lymphadenopathy:     Cervical: No cervical adenopathy.  Skin:    General: Skin is warm and dry.     Findings: No rash.  Neurological:     General: No focal deficit present.     Mental Status: She is alert. Mental status is at baseline.  Psychiatric:        Mood and Affect: Mood normal.        Behavior: Behavior normal.       Results for orders placed or performed in visit on 04/10/22  Lipid panel  Result Value Ref Range   Cholesterol 137 0 - 200 mg/dL   Triglycerides 84.0 0.0 - 149.0 mg/dL   HDL 65.50 >39.00 mg/dL   VLDL 16.8 0.0 - 40.0 mg/dL   LDL Cholesterol 54 0 - 99 mg/dL   Total CHOL/HDL Ratio 2     NonHDL 71.27   Comprehensive metabolic panel  Result Value Ref Range   Sodium 142 135 - 145 mEq/L   Potassium 4.5 3.5 - 5.1 mEq/L   Chloride 108 96 - 112 mEq/L   CO2 27 19 - 32 mEq/L   Glucose, Bld 64 (L) 70 - 99 mg/dL   BUN 25 (H) 6 - 23 mg/dL   Creatinine, Ser 1.21 (H) 0.40 - 1.20 mg/dL   Total Bilirubin 0.6 0.2 - 1.2 mg/dL   Alkaline Phosphatase 56 39 - 117 U/L   AST 14 0 - 37 U/L   ALT 9 0 - 35 U/L   Total Protein 6.6 6.0 - 8.3 g/dL   Albumin 4.5 3.5 - 5.2 g/dL   GFR 40.45 (L) >60.00 mL/min   Calcium 9.8 8.4 - 10.5 mg/dL  Phosphorus  Result Value Ref Range   Phosphorus 3.5 2.3 - 4.6 mg/dL  Microalbumin / creatinine urine ratio  Result Value Ref Range   Microalb, Ur 5.5 (H) 0.0 - 1.9 mg/dL   Creatinine,U 256.9 mg/dL   Microalb Creat Ratio 2.1 0.0 - 30.0 mg/g  CBC with Differential/Platelet  Result Value Ref Range   WBC 5.9 4.0 - 10.5 K/uL   RBC 4.18 3.87 - 5.11 Mil/uL   Hemoglobin 13.2 12.0 - 15.0 g/dL   HCT 38.6 36.0 - 46.0 %   MCV 92.3 78.0 - 100.0 fl   MCHC 34.3 30.0 - 36.0 g/dL   RDW 13.2 11.5 - 15.5 %   Platelets 174.0 150.0 - 400.0 K/uL   Neutrophils Relative % 62.3 43.0 - 77.0 %   Lymphocytes Relative 25.3 12.0 - 46.0 %   Monocytes Relative  9.2 3.0 - 12.0 %   Eosinophils Relative 2.3 0.0 - 5.0 %   Basophils Relative 0.9 0.0 - 3.0 %   Neutro Abs 3.7 1.4 - 7.7 K/uL   Lymphs Abs 1.5 0.7 - 4.0 K/uL   Monocytes Absolute 0.5 0.1 - 1.0 K/uL   Eosinophils Absolute 0.1 0.0 - 0.7 K/uL   Basophils Absolute 0.1 0.0 - 0.1 K/uL  TSH  Result Value Ref Range   TSH 4.12 0.35 - 5.50 uIU/mL  T4, free  Result Value Ref Range   Free T4 0.72 0.60 - 1.60 ng/dL  Vitamin B12  Result Value Ref Range   Vitamin B-12 323 211 - 911 pg/mL  IBC panel  Result Value Ref Range   Iron 103 42 - 145 ug/dL   Transferrin 288.0 212.0 - 360.0 mg/dL   Saturation Ratios 25.5 20.0 - 50.0 %   TIBC 403.2 250.0 - 450.0 mcg/dL  Ferritin  Result Value Ref Range   Ferritin 8.6 (L) 10.0 - 291.0  ng/mL  VITAMIN D 25 Hydroxy (Vit-D Deficiency, Fractures)  Result Value Ref Range   VITD 29.93 (L) 30.00 - 100.00 ng/mL       04/10/2022   11:50 AM 05/03/2021   11:41 AM 11/02/2020   10:11 AM 10/15/2019   12:15 PM 10/18/2018    4:04 PM  Depression screen PHQ 2/9  Decreased Interest 2 0 0 0 2  Down, Depressed, Hopeless 0 0 0 0 2  PHQ - 2 Score 2 0 0 0 4  Altered sleeping 2  2 0 2  Tired, decreased energy 2  3 0 3  Change in appetite 1  2 0 3  Feeling bad or failure about yourself  0  0 0 1  Trouble concentrating 1  0 0 1  Moving slowly or fidgety/restless 2  2 0 2  Suicidal thoughts 0  1 0 0  PHQ-9 Score 10  10 0 16  Difficult doing work/chores Very difficult   Not difficult at all        04/10/2022   11:51 AM 11/02/2020   10:11 AM 10/18/2018    4:05 PM 03/01/2018    3:00 PM  GAD 7 : Generalized Anxiety Score  Nervous, Anxious, on Edge 0 '2 1 1  '$ Control/stop worrying 0 1 1 0  Worry too much - different things 0 '1 1 1  '$ Trouble relaxing 0 0 1 0  Restless 0 0 1 0  Easily annoyed or irritable '1 2 1 2  '$ Afraid - awful might happen 0 0 1 1  Total GAD 7 Score '1 6 7 5   '$ Assessment & Plan:   Problem List Items Addressed This Visit     Encounter for general adult medical examination with abnormal findings - Primary (Chronic)    Preventative protocols reviewed and updated unless pt declined. Discussed healthy diet and lifestyle.       Advanced care planning/counseling discussion (Chronic)    Advanced directive discussion: has at home. Sister [name]) would be HCPOA. Will bring me copy. Full code, but doesn't want prolonged life support if terminal condition.       Chronic diastolic CHF (congestive heart failure) (HCC)    Seems euvolemic. No recent lasix use. Chronic exertional dyspnea      Relevant Medications   ezetimibe (ZETIA) 10 MG tablet   rosuvastatin (CRESTOR) 20 MG tablet   Chronic dyspnea    Chronic issue thought multifactorial s/p pulm and cards workup, had pulm  rehab 2018. Check labs.       Atrial fibrillation, permanent (HCC)    Chronic, stable period on xarelto daily.       Relevant Medications   ezetimibe (ZETIA) 10 MG tablet   rosuvastatin (CRESTOR) 20 MG tablet   Complete heart block (HCC)    S\p av ablation and pPM      Relevant Medications   ezetimibe (ZETIA) 10 MG tablet   rosuvastatin (CRESTOR) 20 MG tablet   Hyperlipidemia    Chronic, stable on daily zetia and QOD crestor- update FLP. The ASCVD Risk score (Arnett DK, et al., 2019) failed to calculate for the following reasons:   The 2019 ASCVD risk score is only valid for ages 41 to 110       Relevant Medications   ezetimibe (ZETIA) 10 MG tablet   rosuvastatin (CRESTOR) 20 MG tablet   Other Relevant Orders   Lipid panel (Completed)   Comprehensive metabolic panel (Completed)   CKD (chronic kidney disease) stage 3, GFR 30-59 ml/min (HCC)    Update renal function.  CKD stage 3 since ~2018      Relevant Orders   Comprehensive metabolic panel (Completed)   Phosphorus (Completed)   Microalbumin / creatinine urine ratio (Completed)   Parathyroid hormone, intact (no Ca)   CBC with Differential/Platelet (Completed)   Mitral regurgitation   Relevant Medications   ezetimibe (ZETIA) 10 MG tablet   rosuvastatin (CRESTOR) 20 MG tablet   Iron deficiency    Update iron levels. She had iron infusions in 2019 and 2021.       Relevant Orders   IBC panel (Completed)   Ferritin (Completed)   Osteoporosis    Mild osteoporosis by latest DEXA, continue calcium and vit D replacement. Not on medication.  Reviewed dietary calcium intake.        Relevant Medications   Cholecalciferol (VITAMIN D3) 25 MCG (1000 UT) CAPS   Other Relevant Orders   VITAMIN D 25 Hydroxy (Vit-D Deficiency, Fractures) (Completed)   Low serum vitamin B12    B12 level today then B12 shot.       Relevant Orders   Vitamin B12 (Completed)   Anxiety    Currently off medication       Subclinical  hypothyroidism    Update thyroid function off replacement. Previously when tried didn't note any benefit.      Relevant Orders   TSH (Completed)   T4, free (Completed)   Vitamin D deficiency    Continue vit D 2000 IU daily.       IgG lambda monoclonal gammopathy   Raynaud's disease   Relevant Medications   ezetimibe (ZETIA) 10 MG tablet   rosuvastatin (CRESTOR) 20 MG tablet   S/P placement of cardiac pacemaker   Mild cognitive impairment with memory loss    Notes ongoing memory difficulty - consider updated MMSE.  She saw neurologist [facility] but decided not to take aricept.       Urge incontinence    Will retry Myrbetriq '25mg'$  daily.       Relevant Medications   mirabegron ER (MYRBETRIQ) 25 MG TB24 tablet   Hearing loss of right ear due to cerumen impaction    S/p cerumen irrigation by CMA today.       Relevant Orders   Ambulatory referral to Audiology   Other Visit Diagnoses     Left carotid bruit       Relevant Orders   VAS US CAROTID  Meds ordered this encounter  Medications   mirabegron ER (MYRBETRIQ) 25 MG TB24 tablet    Sig: Take 1 tablet (25 mg total) by mouth daily.    Dispense:  30 tablet    Refill:  3   cyanocobalamin (VITAMIN B12) injection 1,000 mcg   Cholecalciferol (VITAMIN D3) 25 MCG (1000 UT) CAPS    Sig: Take 2 capsules (2,000 Units total) by mouth daily.   ezetimibe (ZETIA) 10 MG tablet    Sig: Take 1 tablet (10 mg total) by mouth daily.    Dispense:  90 tablet    Refill:  4   rosuvastatin (CRESTOR) 20 MG tablet    Sig: Take 1 tablet (20 mg total) by mouth every other day.    Dispense:  45 tablet    Refill:  4    Orders Placed This Encounter  Procedures   Lipid panel   Comprehensive metabolic panel   Phosphorus   Microalbumin / creatinine urine ratio   Parathyroid hormone, intact (no Ca)   CBC with Differential/Platelet   TSH   T4, free   Vitamin B12   IBC panel   Ferritin   VITAMIN D 25 Hydroxy (Vit-D Deficiency,  Fractures)   Ambulatory referral to Audiology    Referral Priority:   Routine    Referral Type:   Audiology Exam    Referral Reason:   Specialty Services Required    Number of Visits Requested:   1    Patient Instructions  Labs today then B12 shot.  Bring [place] a copy of your living will.  Try Myrbetriq '25mg'$  daily for urine incontinence symptoms - sent to pharmacy today.  We will refer you to audiologist for formal hearing evaluation.  We will refer you for neck artery ultrasound in [place].  Check with pharmacy on date of second shingles shot and let us know.   Goal calcium '1200mg'$  daily. Try to get most or all of your calcium from your food--aim for 1000 mg/day for women up to 50 and men up to 70 and 1200 mg/day for women over 29 and men over 70. To figure out dietary calcium: 300 mg/day from all non dairy foods plus 300 mg per cup of milk, other dairy, or fortified juice. Non dairy foods that contain calcium: Kale, oranges, sardines, oatmeal, soy milk/soybeans, salmon, white beans, dried figs, turnip greens, almonds, broccoli, tofu.   Return in 3 months for follow up visit   Follow up plan: Return in about 3 months (around 07/09/2022), or if symptoms worsen or fail to improve, for follow up visit.  [name], MD

## 2022-04-10 NOTE — Assessment & Plan Note (Signed)
Preventative protocols reviewed and updated unless pt declined. Discussed healthy diet and lifestyle.  

## 2022-04-10 NOTE — Assessment & Plan Note (Addendum)
Advanced directive discussion: has at home. Sister [name]) would be HCPOA. Will bring me copy. Full code, but doesn't want prolonged life support if terminal condition.

## 2022-04-11 ENCOUNTER — Ambulatory Visit: Payer: Medicare Other

## 2022-04-11 ENCOUNTER — Other Ambulatory Visit: Payer: Self-pay | Admitting: Family Medicine

## 2022-04-11 DIAGNOSIS — H6121 Impacted cerumen, right ear: Secondary | ICD-10-CM | POA: Insufficient documentation

## 2022-04-11 LAB — PARATHYROID HORMONE, INTACT (NO CA): PTH: 59 pg/mL (ref 16–77)

## 2022-04-11 MED ORDER — VITAMIN D3 25 MCG (1000 UT) PO CAPS: 2.0000 | ORAL_CAPSULE | Freq: Every day | ORAL | Status: DC

## 2022-04-11 MED ORDER — EZETIMIBE 10 MG PO TABS: 10.0000 mg | ORAL_TABLET | Freq: Every day | ORAL | 4 refills | Status: DC

## 2022-04-11 MED ORDER — IRON (FERROUS SULFATE) 325 (65 FE) MG PO TABS: 325.0000 mg | ORAL_TABLET | ORAL | Status: DC

## 2022-04-11 MED ORDER — ROSUVASTATIN CALCIUM 20 MG PO TABS: 20.0000 mg | ORAL_TABLET | ORAL | 4 refills | Status: DC

## 2022-04-11 NOTE — Assessment & Plan Note (Signed)
S\p av ablation and pPM

## 2022-04-11 NOTE — Assessment & Plan Note (Signed)
Currently off medication

## 2022-04-11 NOTE — Assessment & Plan Note (Signed)
Chronic, stable period on xarelto daily.

## 2022-04-11 NOTE — Assessment & Plan Note (Signed)
Update iron levels. She had iron infusions in 2019 and 2021.

## 2022-04-11 NOTE — Assessment & Plan Note (Addendum)
Mild osteoporosis by latest DEXA, continue calcium and vit D replacement. Not on medication.  Reviewed dietary calcium intake.

## 2022-04-11 NOTE — Assessment & Plan Note (Signed)
Will retry Myrbetriq '25mg'$  daily.

## 2022-04-11 NOTE — Assessment & Plan Note (Addendum)
Update thyroid function off replacement. Previously when tried didn't note any benefit.

## 2022-04-11 NOTE — Assessment & Plan Note (Signed)
Chronic, stable on daily zetia and QOD crestor- update FLP. The ASCVD Risk score (Arnett DK, et al., 2019) failed to calculate for the following reasons:   The 2019 ASCVD risk score is only valid for ages 39 to 61

## 2022-04-11 NOTE — Assessment & Plan Note (Signed)
S/p cerumen irrigation by CMA today.

## 2022-04-11 NOTE — Assessment & Plan Note (Signed)
B12 level today then B12 shot.

## 2022-04-11 NOTE — Assessment & Plan Note (Addendum)
Update renal function.  CKD stage 3 since ~2018

## 2022-04-11 NOTE — Assessment & Plan Note (Signed)
Continue vit D 2000 IU daily.  

## 2022-04-11 NOTE — Assessment & Plan Note (Signed)
Notes ongoing memory difficulty - consider updated MMSE.  She saw neurologist [facility] but decided not to take aricept.

## 2022-04-11 NOTE — Assessment & Plan Note (Addendum)
Seems euvolemic. No recent lasix use. Chronic exertional dyspnea

## 2022-04-11 NOTE — Assessment & Plan Note (Addendum)
Chronic issue thought multifactorial s/p pulm and cards workup, had pulm rehab 2018. Check labs.

## 2022-04-12 ENCOUNTER — Encounter: Payer: Self-pay | Admitting: *Deleted

## 2022-04-21 ENCOUNTER — Other Ambulatory Visit: Payer: Self-pay | Admitting: Cardiovascular Disease

## 2022-04-26 ENCOUNTER — Other Ambulatory Visit: Payer: Self-pay | Admitting: Cardiovascular Disease

## 2022-04-27 ENCOUNTER — Telehealth: Payer: Self-pay | Admitting: Family Medicine

## 2022-04-27 NOTE — Telephone Encounter (Signed)
Contacted [name] to schedule their annual wellness visit. Appointment made for 03/19/2023.  [place] Direct Dial: [phone]

## 2022-04-27 NOTE — Telephone Encounter (Signed)
Contacted Sierra Kaiser to schedule their annual wellness visit. Appointment made for 05/25/2022.  [place] Direct Dial: [phone]

## 2022-04-27 NOTE — Telephone Encounter (Signed)
Called patient to schedule Medicare Annual Wellness Visit (AWV). Unable to reach patient.  Last date of AWV: 05/03/2021  Please schedule an appointment at any time with [facility].  If any questions, please contact me at [phone].  Thank you ,  [place] Direct Dial: [phone]

## 2022-04-29 ENCOUNTER — Other Ambulatory Visit: Payer: Self-pay | Admitting: Family Medicine

## 2022-05-01 DIAGNOSIS — H903 Sensorineural hearing loss, bilateral: Secondary | ICD-10-CM | POA: Diagnosis not present

## 2022-05-04 ENCOUNTER — Ambulatory Visit: Payer: Medicare Other

## 2022-05-10 ENCOUNTER — Ambulatory Visit: Payer: Medicare Other

## 2022-05-10 DIAGNOSIS — I428 Other cardiomyopathies: Secondary | ICD-10-CM

## 2022-05-10 LAB — CUP PACEART REMOTE DEVICE CHECK
Battery Remaining Longevity: 50 mo
Battery Remaining Percentage: 36 %
Battery Voltage: 2.93 V
Brady Statistic RV Percent Paced: 98 %
Date Time Interrogation Session: 20240306054911
Implantable Lead Connection Status: 753985
Implantable Lead Implant Date: 20160217
Implantable Lead Location: 753860
Implantable Lead Model: 1948
Implantable Pulse Generator Implant Date: 20160217
Lead Channel Impedance Value: 600 Ohm
Lead Channel Pacing Threshold Amplitude: 0.5 V
Lead Channel Pacing Threshold Pulse Width: 0.4 ms
Lead Channel Sensing Intrinsic Amplitude: 8.5 mV
Lead Channel Setting Pacing Amplitude: 0.75 V
Lead Channel Setting Pacing Pulse Width: 0.4 ms
Lead Channel Setting Sensing Sensitivity: 2.5 mV
Pulse Gen Model: 2240
Pulse Gen Serial Number: 3050195

## 2022-05-11 ENCOUNTER — Ambulatory Visit (INDEPENDENT_AMBULATORY_CARE_PROVIDER_SITE_OTHER): Payer: Medicare Other

## 2022-05-11 DIAGNOSIS — E538 Deficiency of other specified B group vitamins: Secondary | ICD-10-CM | POA: Diagnosis not present

## 2022-05-11 MED ORDER — CYANOCOBALAMIN 1000 MCG/ML IJ SOLN
1000.0000 ug | Freq: Once | INTRAMUSCULAR | Status: AC
  Administered 2022-05-11: 1000 ug via INTRAMUSCULAR

## 2022-05-11 NOTE — Progress Notes (Signed)
Per orders of Dr. Elsie Stain, injection of vitamin B 12 given by Ozzie Hoyle in left deltoid. Patient tolerated injection well. Patient will make appointment for 31 days for next Vitamin B 12 injection. Marland Kitchen

## 2022-05-18 ENCOUNTER — Other Ambulatory Visit: Payer: Self-pay | Admitting: Cardiovascular Disease

## 2022-05-18 NOTE — Telephone Encounter (Signed)
Refill Request.  

## 2022-05-18 NOTE — Telephone Encounter (Signed)
Prescription refill request for Xarelto received.  Indication: AF Last office visit: 04/13/21  [name] MD Weight: 62.3kg Age: 89 Scr: 1.21 on 04/10/22 CrCl: 32.22  Based on above findings Xarelto '15mg'$  daily is the appropriate dose,  Pt is past due for appt with Dr Rockey Situ.  Message sent to schedulers to made appt.  Refill approved x 1.

## 2022-05-19 NOTE — Telephone Encounter (Signed)
Pt is scheduled on 4/16

## 2022-05-25 ENCOUNTER — Ambulatory Visit: Payer: Medicare Other

## 2022-05-30 ENCOUNTER — Ambulatory Visit: Payer: Medicare Other | Attending: Family Medicine

## 2022-05-30 DIAGNOSIS — R0989 Other specified symptoms and signs involving the circulatory and respiratory systems: Secondary | ICD-10-CM

## 2022-06-13 ENCOUNTER — Telehealth: Payer: Self-pay

## 2022-06-13 ENCOUNTER — Ambulatory Visit (INDEPENDENT_AMBULATORY_CARE_PROVIDER_SITE_OTHER): Payer: Medicare Other

## 2022-06-13 DIAGNOSIS — E538 Deficiency of other specified B group vitamins: Secondary | ICD-10-CM | POA: Diagnosis not present

## 2022-06-13 MED ORDER — CYANOCOBALAMIN 1000 MCG/ML IJ SOLN
1000.0000 ug | Freq: Once | INTRAMUSCULAR | Status: AC
  Administered 2022-06-13: 1000 ug via INTRAMUSCULAR

## 2022-06-13 NOTE — Telephone Encounter (Signed)
Pt came to office for vitamin B 12 injection with a nurse visit. Pt said for 3 - 4 days pt had pain in lower lt arm on and off, sharp pain at times. No upper lt arm pain and no CP or SOB. Pt does not remember hitting or hurting the left arm but pt said she might not remember. I offered to schedule pt an appt to be seen but pt said no she was going to give it more time to stop hurting. The weather has been damp and cool and pt will cb if needs appt. UC & ED precautions also given and pt voiced understanding. Sending note as FYI to Dr Sharen Hones and [facility]aren Hones pool.

## 2022-06-13 NOTE — Telephone Encounter (Signed)
Noted  

## 2022-06-13 NOTE — Progress Notes (Addendum)
Per orders of Dr. Javier Gutierrez, injection of Vitamin B 12 given in right deltoid given by Yassin Scales. Patient tolerated injection well.  

## 2022-06-14 NOTE — Progress Notes (Signed)
Remote pacemaker transmission.   

## 2022-06-20 ENCOUNTER — Encounter: Payer: Self-pay | Admitting: Medical

## 2022-06-20 ENCOUNTER — Ambulatory Visit: Payer: Medicare Other | Attending: Medical | Admitting: Medical

## 2022-06-20 VITALS — BP 142/64 | HR 70 | Ht 67.0 in | Wt 137.4 lb

## 2022-06-20 DIAGNOSIS — I4821 Permanent atrial fibrillation: Secondary | ICD-10-CM

## 2022-06-20 DIAGNOSIS — I442 Atrioventricular block, complete: Secondary | ICD-10-CM

## 2022-06-20 DIAGNOSIS — E782 Mixed hyperlipidemia: Secondary | ICD-10-CM | POA: Diagnosis not present

## 2022-06-20 DIAGNOSIS — I5022 Chronic systolic (congestive) heart failure: Secondary | ICD-10-CM | POA: Diagnosis not present

## 2022-06-20 DIAGNOSIS — I428 Other cardiomyopathies: Secondary | ICD-10-CM

## 2022-06-20 DIAGNOSIS — I5032 Chronic diastolic (congestive) heart failure: Secondary | ICD-10-CM

## 2022-06-20 DIAGNOSIS — Z950 Presence of cardiac pacemaker: Secondary | ICD-10-CM

## 2022-06-20 NOTE — Progress Notes (Unsigned)
Cardiology Office Note:    Date:  06/21/2022   ID:  Sierra Kaiser, DOB 1933-08-14, MRN [id number]  PCP:  Sierra Boyden, MD  [facility] Cardiologist:  Sierra Manges, MD  [facility] Electrophysiologist:  None   Referring MD: Sierra Boyden, MD   Chief Complaint: 1 year follow-up  History of Present Illness:    Sierra Kaiser is a 89 y.o. female with a hx of Afib previously followed by Sierra, s/p ablation in 2014, CKD, CHB s/p PPM, NICM LVEF 45-50%, aortic atherosclerosis, anxiety/stress, mild to mod MR, who presents for 1 year follow-up.   Cardiac cath 05/2007 with no significant CAD. H/o NICM with EF as low as 45-50% with improvement by echo 2017 to 55-65% with mild to mod MR. Most recent echo in 2020 showed LVEF 45-50%, mildly reduced RVSF, moderately dilated LA, moderate MR, moderate to severe TR, mild to mod AI.  Patient has h/o afib and previously failed tikosyn and amiodarone and has had prior cardioversions. She underwent ablation at [facility]. She follows with Dr. Graciela Kaiser.   Last seen 04/2021 and was stable from a cardiac perspective.   Today, the patient reports she has stable and unchanged shortness of breath on exertion. Breathing is normal while sitting. No chest pain. No lower leg edema. She had an US Carotids for dizziness which showed 1-39% b/l. She has some foot pain. No palpitations. She is taking Xarelto  daily. She is active and is gardening daily. When she works a lot and can have some dizziness.   Past Medical History:  Diagnosis Date   Anxiety    CAD (coronary artery disease)    CHF (congestive heart failure)    Esophageal reflux    GI bleeding 2012   during EGD necessitating open surgery   Hiatal hernia    History of blood transfusion    History of diverticulitis    History of UTI    HTN (hypertension)    Hyperlipidemia    Meniere's disease 1970s   Mitral regurgitation    Osteopenia 12/11/2015   T score -1.2 femur, -2.0 spine (12/2015)    Permanent atrial fibrillation    a. permanent b. s/p PVI RFA at [facility] 10/14 c. failed amio (neuro toxicity) and Tikosyn d. 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    PUD (peptic ulcer disease)    Raynaud's disease    SNHL (sensorineural hearing loss)    right ear   Syncopal episodes    Tinnitus    Tinnitus    Ulcerative colitis    per prior records   Urinary incontinence, urge     Past Surgical History:  Procedure Laterality Date   APPENDECTOMY  1973   AV NODE ABLATION N/A 05/06/2014   Procedure: AV NODE ABLATION;  Surgeon: Sierra Salvia, MD;  Location: [facility] CATH LAB;  Service: Cardiovascular;  Laterality: N/A;   CARDIAC CATHETERIZATION  2014   Sierra   CARDIAC ELECTROPHYSIOLOGY STUDY AND ABLATION     CHOLECYSTECTOMY  2010   COLONOSCOPY  2010   polyps   COLONOSCOPY WITH PROPOFOL N/A 08/29/2017   TAs, no f/u recommended Sierra Kaiser, Sierra Nearing, MD)   ESOPHAGOGASTRODUODENOSCOPY (EGD) WITH PROPOFOL N/A 08/29/2017   benign biopsies Sierra Kaiser, Sierra Nearing, MD)   HEMORRHOID SURGERY     LAPAROTOMY  2012   EGD biopsy led to bleeding - needed laparotomy to stop bleed (Dr Sierra Kaiser at [facility])   PERMANENT PACEMAKER INSERTION N/A 04/22/2014  STJ single chamber pacemaker implanted by Dr Sierra Kaiser   TRANSESOPHAGEAL ECHOCARDIOGRAM WITH CARDIOVERSION  2014   Sierra   UPPER GI ENDOSCOPY  2012   with polypectomy   VAGINAL HYSTERECTOMY  1973   elective; ovaries remained    Current Medications: Current Meds  Medication Sig   Cholecalciferol (VITAMIN D3) 25 MCG (1000 UT) CAPS Take 2 capsules (2,000 Units total) by mouth daily.   cyanocobalamin (,VITAMIN B-12,) 1000 MCG/ML injection Inject 1 mL (1,000 mcg total) into the muscle every 30 (thirty) days.   ezetimibe (ZETIA) 10 MG tablet Take 1 tablet (10 mg total) by mouth daily.   fluticasone (FLONASE) 50 MCG/ACT nasal spray SPRAY 2 SPRAYS INTO EACH NOSTRIL EVERY DAY   furosemide (LASIX) 20 MG tablet TAKE 1 TABLET (20  MG TOTAL) BY MOUTH AS NEEDED FOR FLUID OR EDEMA.   Polyethyl Glycol-Propyl Glycol (SYSTANE OP) Place 1 drop into both eyes daily as needed (dry eyes).   Rivaroxaban (XARELTO) 15 MG TABS tablet TAKE 1 TABLET BY MOUTH DAILY WITH SUPPER   rosuvastatin (CRESTOR) 20 MG tablet Take 1 tablet (20 mg total) by mouth every other day.     Allergies:   Gadolinium derivatives, Contrast media [iodinated contrast media], Gabapentin, Metronidazole, Other, Sertraline, and Diltiazem hcl   Social History   Socioeconomic History   Marital status: Married    Spouse name: Not on file   Number of children: Not on file   Years of education: Not on file   Highest education level: Not on file  Occupational History   Not on file  Tobacco Use   Smoking status: Never   Smokeless tobacco: Never  Vaping Use   Vaping Use: Never used  Substance and Sexual Activity   Alcohol use: Yes    Alcohol/week: 1.0 standard drink of alcohol    Types: 1 Glasses of wine per week    Comment: 1-2 glasses wine a month   Drug use: Never   Sexual activity: Never  Other Topics Concern   Not on file  Social History Narrative   Lives with husband.    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)   Edu: [employer]   Social Determinants of Corporate investment banker Strain: Low Risk  (05/03/2021)   Overall Financial Resource Strain (CARDIA)    Difficulty of Paying Living Expenses: Not hard at all  Food Insecurity: No Food Insecurity (05/03/2021)   Hunger Vital Sign    Worried About Running Out of Food in the Last Year: Never true    Ran Out of Food in the Last Year: Never true  Transportation Needs: No Transportation Needs (05/03/2021)   PRAPARE - Administrator, Civil Service (Medical): No    Lack of Transportation (Non-Medical): No  Physical Activity: Inactive (10/15/2019)   Exercise Vital Sign    Days of Exercise per Week: 0 days    Minutes of Exercise per Session: 0 min   Stress: No Stress Concern Present (05/03/2021)   [employer] of Occupational Health - Occupational Stress Questionnaire    Feeling of Stress : Not at all  Social Connections: Moderately Integrated (05/03/2021)   Social Connection and Isolation Panel [NHANES]    Frequency of Communication with Friends and Family: Three times a week    Frequency of Social Gatherings with Friends and Family: Twice a week    Attends Religious Services: More than 4 times per year    Active Member  of Clubs or Organizations: No    Attends Banker Meetings: Never    Marital Status: Married     Family History: The patient's family history includes Alcohol abuse in her brother; Arrhythmia in her mother; Breast cancer (age of onset: 39) in her sister; Cancer in her brother; Diabetes in her mother and son; Heart failure in her brother and brother; Hypertension in her mother; Stroke in her brother and sister.  ROS:   Please see the history of present illness.     All other systems reviewed and are negative.  EKGs/Labs/Other Studies Reviewed:    The following studies were reviewed today:  Echo 2020 1. The left ventricle has mildly reduced systolic function, with an  ejection fraction of 45-50%. The cavity size was mildly dilated. Left  ventricular diastolic Doppler parameters are indeterminate. Left  ventricular diffuse hypokinesis.   2. The right ventricle has mildly reduced systolic function. The cavity  was mildly enlarged. There is no increase in right ventricular wall  thickness. Right ventricular systolic pressure is probably upper normal to  mildly elevated (25-30 mmHg plus  central venous pressure).   3. Left atrial size was moderately dilated.   4. Right atrial size was mildly dilated.   5. The mitral valve is degenerative. Mild thickening of the mitral valve  leaflet. There is mild mitral annular calcification present. Mitral valve  regurgitation is moderate by color flow Doppler.    6. The tricuspid valve is grossly normal. Tricuspid valve regurgitation  is moderate-severe.   7. The aortic valve is tricuspid. Aortic valve regurgitation is probably  mild to moderate by color flow Doppler, though continuous wave Doppler  envelope is suboptimal for evaluation of regurgitation.   8. The aortic root and ascending aorta are normal in size and structure.   9. The interatrial septum was not well visualized.    EKG:  EKG is ordered today.  The ekg ordered today demonstrates V paced rhythm, 70bpm  Recent Labs: 04/10/2022: ALT 9; BUN 25; Creatinine, Ser 1.21; Hemoglobin 13.2; Platelets 174.0; Potassium 4.5; Sodium 142; TSH 4.12  Recent Lipid Panel    Component Value Date/Time   CHOL 137 04/10/2022 1257   CHOL 159 08/15/2016 1114   TRIG 84.0 04/10/2022 1257   HDL 65.50 04/10/2022 1257   HDL 57 08/15/2016 1114   CHOLHDL 2 04/10/2022 1257   VLDL 16.8 04/10/2022 1257   LDLCALC 54 04/10/2022 1257   LDLCALC 73 08/15/2016 1114     Physical Exam:    VS:  BP (!) 142/64 (BP Location: Left Arm, Patient Position: Sitting, Cuff Size: Normal)   Pulse 70   Ht 5\' 7"  (1.702 m)   Wt 137 lb 6.4 oz (62.3 kg)   SpO2 98%   BMI 21.52 kg/m     Wt Readings from Last 3 Encounters:  06/20/22 137 lb 6.4 oz (62.3 kg)  04/10/22 137 lb 4 oz (62.3 kg)  02/06/22 134 lb 12.8 oz (61.1 kg)     GEN:  Well nourished, well developed in no acute distress HEENT: Normal NECK: No JVD; No carotid bruits LYMPHATICS: No lymphadenopathy CARDIAC: RRR, + murmur, no rubs, gallops RESPIRATORY:  Clear to auscultation without rales, wheezing or rhonchi  ABDOMEN: Soft, non-tender, non-distended MUSCULOSKELETAL:  No edema; No deformity  SKIN: Warm and dry NEUROLOGIC:  Alert and oriented x 3 PSYCHIATRIC:  Normal affect   ASSESSMENT:    1. Chronic systolic heart failure   2. NICM (nonischemic cardiomyopathy)   3.  Atrial fibrillation, permanent   4. Hyperlipidemia, mixed   5. Complete heart block    6. Cardiac pacemaker in situ    PLAN:    In order of problems listed above:  NICM HFrEF Most recent echo 2020 showed LVEF 45-50%, mildly reduced RVSF, moderate MR, mod-severe TR. The patient is euvolemic on exam. She takes lasix as needed for swelling. She reports stable DOE. I will repeat an echocardiogram.  Mod MR Moderate MR by echo in 2020. I will repeat echo as above.   Chronic dizziness Recent Carotid US showed 1-39% stenosis. She reports dizziness is overall unchanged. She is fairly active with gardening and house work. Recommended good hydration during exertion.   S/p PPM Normally functioning device on most recent check. Will have patient see EP this year.  HLD Continue Crestor and Zetia.   Afib Continue Xarelto 15mg  daily.   Disposition: Follow up in 1 year(s) with MD/APP    Signed, Carmalita [place] David Stall, PA-C  06/21/2022 9:35 AM    [facility]

## 2022-06-20 NOTE — Patient Instructions (Signed)
Medication Instructions:  Your physician recommends that you continue on your current medications as directed. Please refer to the Current Medication list given to you today.  *If you need a refill on your cardiac medications before your next appointment, please call your pharmacy*   Lab Work: -None ordered If you have labs (blood work) drawn today and your tests are completely normal, you will receive your results only by: MyChart Message (if you have MyChart) OR A paper copy in the mail If you have any lab test that is abnormal or we need to change your treatment, we will call you to review the results.   Testing/Procedures: Your physician has requested that you have an echocardiogram. Echocardiography is a painless test that uses sound waves to create images of your heart. It provides your doctor with information about the size and shape of your heart and how well your heart's chambers and valves are working. This procedure takes approximately one hour. There are no restrictions for this procedure. Please do NOT wear cologne, perfume, aftershave, or lotions (deodorant is allowed). Please arrive 15 minutes prior to your appointment time.    Follow-Up: At [facility], you and your health needs are our priority.  As part of our continuing mission to provide you with exceptional heart care, we have created designated Provider Care Teams.  These Care Teams include your primary Cardiologist (physician) and Advanced Practice Providers (APPs -  Physician Assistants and Nurse Practitioners) who all work together to provide you with the care you need, when you need it.  We recommend signing up for the patient portal called "MyChart".  Sign up information is provided on this After Visit Summary.  MyChart is used to connect with patients for Virtual Visits (Telemedicine).  Patients are able to view lab/test results, encounter notes, upcoming appointments, etc.  Non-urgent messages can be sent  to your provider as well.   To learn more about what you can do with MyChart, go to [email or web address].    Your next appointment:   Julien Nordmann, MD - 1 year Sherryl Manges, MD - 6 months  Provider:   Julien Nordmann, MD  Sherryl Manges, MD   Other Instructions -None

## 2022-07-04 ENCOUNTER — Emergency Department: Payer: Medicare Other

## 2022-07-04 ENCOUNTER — Telehealth: Payer: Self-pay | Admitting: Family Medicine

## 2022-07-04 ENCOUNTER — Emergency Department
Admission: EM | Admit: 2022-07-04 | Discharge: 2022-07-04 | Disposition: A | Discharge status: Home / Self Care | Payer: Medicare Other | Attending: Emergency Medicine | Admitting: Emergency Medicine

## 2022-07-04 DIAGNOSIS — S0101XA Laceration without foreign body of scalp, initial encounter: Secondary | ICD-10-CM | POA: Insufficient documentation

## 2022-07-04 DIAGNOSIS — S0990XA Unspecified injury of head, initial encounter: Secondary | ICD-10-CM | POA: Diagnosis not present

## 2022-07-04 DIAGNOSIS — W19XXXA Unspecified fall, initial encounter: Secondary | ICD-10-CM

## 2022-07-04 DIAGNOSIS — Z7901 Long term (current) use of anticoagulants: Secondary | ICD-10-CM | POA: Insufficient documentation

## 2022-07-04 DIAGNOSIS — W010XXA Fall on same level from slipping, tripping and stumbling without subsequent striking against object, initial encounter: Secondary | ICD-10-CM | POA: Diagnosis not present

## 2022-07-04 DIAGNOSIS — M47812 Spondylosis without myelopathy or radiculopathy, cervical region: Secondary | ICD-10-CM | POA: Diagnosis not present

## 2022-07-04 MED ORDER — LIDOCAINE HCL (PF) 1 % IJ SOLN
5.0000 mL | Freq: Once | INTRAMUSCULAR | Status: AC
  Administered 2022-07-04: 5 mL
  Filled 2022-07-04: qty 5

## 2022-07-04 NOTE — Telephone Encounter (Signed)
Pt sister Ashok Cordia called stated pt fell at home and now has staples in her head . Wants to know if they can be taken out at her office visit. Marland Kitchen Please advise # [phone]

## 2022-07-04 NOTE — Discharge Instructions (Signed)
Pain control: Acetaminophen (tylenol) - You can take 2 extra strength tablets (1000 mg) every 6 hours as needed for pain.  Or you can take 650 mg every 4 hours as needed for pain.  Follow-up in 1 week for wound reevaluation and to have your staples removed.  Keep the area clean and dry.  You can apply bacitracin.

## 2022-07-04 NOTE — Telephone Encounter (Signed)
Yes ok to schedule in office for 10 d after staples placed.

## 2022-07-04 NOTE — ED Triage Notes (Signed)
First RN nurse- Pt from [facility], fall with hitting head. Pt is on blood thinner, xeralto

## 2022-07-04 NOTE — Telephone Encounter (Signed)
Looks like pt was seen at [facility] ED today and received staples in her head. Ok to schedule ER f/u to have them removed?

## 2022-07-04 NOTE — ED Provider Notes (Signed)
[facility] Provider Note    Event Date/Time   First MD Initiated Contact with Patient 07/04/22 1312     (approximate)   History   Fall   HPI  Sierra Kaiser is a 89 y.o. female past medical history significant for atrial fibrillation on Xarelto who presents to the emergency department following a fall.  States that she got tripped up on her house slippers and fell hitting her head.  Denies loss of consciousness.  No preceding chest pain or shortness of breath.  Complaining of a mild headache.  No neck pain.  Denies any chest pain, shortness of breath, pain in extremities.  No weakness.     Physical Exam   Triage Vital Signs: ED Triage Vitals  Enc Vitals Group     BP 07/04/22 1153 (!) 178/63     Pulse Rate 07/04/22 1153 70     Resp 07/04/22 1153 18     Temp 07/04/22 1153 98 F (36.7 C)     Temp src --      SpO2 07/04/22 1153 96 %     Weight --      Height --      Head Circumference --      Peak Flow --      Pain Score 07/04/22 1152 4     Pain Loc --      Pain Edu? --      Excl. in GC? --     Most recent vital signs: Vitals:   07/04/22 1153  BP: (!) 178/63  Pulse: 70  Resp: 18  Temp: 98 F (36.7 C)  SpO2: 96%    Physical Exam Constitutional:      Appearance: She is well-developed.  HENT:     Head:     Comments: 4 cm laceration to the occipital scalp, hemostatic    Right Ear: External ear normal.     Left Ear: External ear normal.  Eyes:     Extraocular Movements: Extraocular movements intact.     Pupils: Pupils are equal, round, and reactive to light.  Cardiovascular:     Rate and Rhythm: Normal rate and regular rhythm.  Pulmonary:     Effort: No respiratory distress.  Abdominal:     General: There is no distension.  Musculoskeletal:        General: No tenderness. Normal range of motion.     Cervical back: Normal range of motion. No tenderness.  Skin:    General: Skin is warm.  Neurological:     General: No focal  deficit present.     Mental Status: She is alert. Mental status is at baseline.     IMPRESSION / MDM / ASSESSMENT AND PLAN / ED COURSE  I reviewed the triage vital signs and the nursing notes.  Differential diagnosis including intracranial hemorrhage, concussion, scalp laceration.  Patient had a nonsyncopal fall.  No ongoing dizziness, have low suspicion for infectious process, ACS or dysrhythmia.  RADIOLOGY I independently reviewed imaging, my interpretation of imaging: CT scan of head with no signs of intracranial hemorrhage.  CT scan read as no acute findings.  CT scan of the neck read as no acute findings.  LABS (all labs ordered are listed, but only abnormal results are displayed) Labs interpreted as -    Labs Reviewed - No data to display   MDM  Laceration was repaired at bedside.  Discussed ways to improve and prevent falls from home.  Encouraged follow-up with  her primary care physician 1 week for wound reevaluation and staple removal.  Discussed symptomatic treatment with Tylenol.  Given return precautions for any worsening symptoms.  Patient will be going home with her sister who is at bedside.     PROCEDURES:  Critical Care performed: No  ..Laceration Repair  Date/Time: 07/04/2022 2:24 PM  Performed by: Corena Herter, MD Authorized by: Corena Herter, MD   Consent:    Consent obtained:  Verbal   Consent given by:  Patient   Risks, benefits, and alternatives were discussed: yes     Risks discussed:  Pain, nerve damage, poor wound healing, poor cosmetic result, vascular damage, tendon damage, infection and need for additional repair   Alternatives discussed:  Delayed treatment and no treatment Universal protocol:    Procedure explained and questions answered to patient or proxy's satisfaction: yes     Relevant documents present and verified: yes     Test results available: yes     Imaging studies available: yes     Required blood products, implants, devices,  and special equipment available: yes     Patient identity confirmed:  Verbally with patient Anesthesia:    Anesthesia method:  Local infiltration   Local anesthetic:  Lidocaine 1% w/o epi Laceration details:    Location:  Scalp   Scalp location:  Crown   Length (cm):  4 Pre-procedure details:    Preparation:  Patient was prepped and draped in usual sterile fashion Treatment:    Amount of cleaning:  Standard Skin repair:    Repair method:  Staples   Number of staples:  5 Approximation:    Approximation:  Close Repair type:    Repair type:  Simple Post-procedure details:    Dressing:  Sterile dressing   Procedure completion:  Tolerated well, no immediate complications   Patient's presentation is most consistent with acute presentation with potential threat to life or bodily function.   MEDICATIONS ORDERED IN ED: Medications  lidocaine (PF) (XYLOCAINE) 1 % injection 5 mL (5 mLs Other Given 07/04/22 1332)    FINAL CLINICAL IMPRESSION(S) / ED DIAGNOSES   Final diagnoses:  Laceration of scalp, initial encounter  Fall, initial encounter     Rx / DC Orders   ED Discharge Orders     None        Note:  This document was prepared using Dragon voice recognition software and may include unintentional dictation errors.   Corena Herter, MD 07/04/22 1425

## 2022-07-04 NOTE — ED Triage Notes (Signed)
Pt comes with c/o fall. Pt stats she tripped over her shoe and must have hit her head on the dresser. Pt is on xarelto. Pt has about 2 inch lac bleeding controlled and bandage in place at this time.

## 2022-07-05 NOTE — Telephone Encounter (Signed)
LVM for patient to cb and sch.  

## 2022-07-10 ENCOUNTER — Telehealth: Payer: Self-pay | Admitting: Family Medicine

## 2022-07-10 ENCOUNTER — Encounter: Payer: Self-pay | Admitting: Family Medicine

## 2022-07-10 ENCOUNTER — Ambulatory Visit (INDEPENDENT_AMBULATORY_CARE_PROVIDER_SITE_OTHER): Payer: Medicare Other | Admitting: Family Medicine

## 2022-07-10 VITALS — BP 134/78 | HR 73 | Ht 67.0 in | Wt 135.5 lb

## 2022-07-10 DIAGNOSIS — N1832 Chronic kidney disease, stage 3b: Secondary | ICD-10-CM | POA: Diagnosis not present

## 2022-07-10 DIAGNOSIS — E611 Iron deficiency: Secondary | ICD-10-CM

## 2022-07-10 DIAGNOSIS — R5382 Chronic fatigue, unspecified: Secondary | ICD-10-CM

## 2022-07-10 DIAGNOSIS — N3941 Urge incontinence: Secondary | ICD-10-CM

## 2022-07-10 DIAGNOSIS — S0101XA Laceration without foreign body of scalp, initial encounter: Secondary | ICD-10-CM | POA: Diagnosis not present

## 2022-07-10 DIAGNOSIS — E538 Deficiency of other specified B group vitamins: Secondary | ICD-10-CM | POA: Diagnosis not present

## 2022-07-10 DIAGNOSIS — S0101XD Laceration without foreign body of scalp, subsequent encounter: Secondary | ICD-10-CM | POA: Insufficient documentation

## 2022-07-10 DIAGNOSIS — Z7189 Other specified counseling: Secondary | ICD-10-CM

## 2022-07-10 DIAGNOSIS — G3184 Mild cognitive impairment, so stated: Secondary | ICD-10-CM | POA: Diagnosis not present

## 2022-07-10 HISTORY — DX: Laceration without foreign body of scalp, subsequent encounter: S01.01XD

## 2022-07-10 LAB — CBC WITH DIFFERENTIAL/PLATELET
Basophils Absolute: 0 10*3/uL (ref 0.0–0.1)
Basophils Relative: 0.7 % (ref 0.0–3.0)
Eosinophils Absolute: 0.2 10*3/uL (ref 0.0–0.7)
Eosinophils Relative: 4 % (ref 0.0–5.0)
HCT: 39.2 % (ref 36.0–46.0)
Hemoglobin: 13.2 g/dL (ref 12.0–15.0)
Lymphocytes Relative: 29.3 % (ref 12.0–46.0)
Lymphs Abs: 1.6 10*3/uL (ref 0.7–4.0)
MCHC: 33.7 g/dL (ref 30.0–36.0)
MCV: 90.8 fl (ref 78.0–100.0)
Monocytes Absolute: 0.5 10*3/uL (ref 0.1–1.0)
Monocytes Relative: 9.3 % (ref 3.0–12.0)
Neutro Abs: 3.2 10*3/uL (ref 1.4–7.7)
Neutrophils Relative %: 56.7 % (ref 43.0–77.0)
Platelets: 188 10*3/uL (ref 150.0–400.0)
RBC: 4.32 Mil/uL (ref 3.87–5.11)
RDW: 13.8 % (ref 11.5–15.5)
WBC: 5.6 10*3/uL (ref 4.0–10.5)

## 2022-07-10 LAB — RENAL FUNCTION PANEL
Albumin: 4.4 g/dL (ref 3.5–5.2)
BUN: 25 mg/dL — ABNORMAL HIGH (ref 6–23)
CO2: 26 mEq/L (ref 19–32)
Calcium: 9.6 mg/dL (ref 8.4–10.5)
Chloride: 107 mEq/L (ref 96–112)
Creatinine, Ser: 1.14 mg/dL (ref 0.40–1.20)
GFR: 43.37 mL/min — ABNORMAL LOW (ref >60.00)
Glucose, Bld: 88 mg/dL (ref 70–99)
Phosphorus: 3.7 mg/dL (ref 2.3–4.6)
Potassium: 4.8 mEq/L (ref 3.5–5.1)
Sodium: 142 mEq/L (ref 135–145)

## 2022-07-10 LAB — IBC PANEL
Iron: 68 ug/dL (ref 42–145)
Saturation Ratios: 16.7 % — ABNORMAL LOW (ref 20.0–50.0)
TIBC: 407.4 ug/dL (ref 250.0–450.0)
Transferrin: 291 mg/dL (ref 212.0–360.0)

## 2022-07-10 LAB — FERRITIN: Ferritin: 8.3 ng/mL — ABNORMAL LOW (ref 10.0–291.0)

## 2022-07-10 LAB — VITAMIN B12: Vitamin B-12: 717 pg/mL (ref 211–911)

## 2022-07-10 MED ORDER — OXYBUTYNIN CHLORIDE 5 MG PO TABS: 5.0000 mg | ORAL_TABLET | Freq: Every day | ORAL | 1 refills | Status: DC | PRN

## 2022-07-10 NOTE — Assessment & Plan Note (Signed)
Brings living will and HCPOA which was reviewed - Sierra Kaiser then [employer] are HCPOA. Does not want prolonged life support if terminal condition.

## 2022-07-10 NOTE — Assessment & Plan Note (Addendum)
Suffered mechanical fall at home after tripping over household slippers.  S/p 5 staples placed to back of head, 6 days ago.  Staples too soon to remove. Will return on Friday (10d after injury) to remove staples.

## 2022-07-10 NOTE — Assessment & Plan Note (Signed)
Myrbertriq trial ineffective. Desires to try oxybutynin 5mg  IR PRN. Discussed risks/side effects of medication including anticholinergic properties. She and sister are in agreement they desire to restart this PRN.

## 2022-07-10 NOTE — Progress Notes (Signed)
Ph: [phone]       Fax: [phone]   Patient ID: Sierra Kaiser, female    DOB: November 01, 1933, 89 y.o.   MRN: [id number]  This visit was conducted in person.  BP 134/78   Pulse 73   Ht 5\' 7"  (1.702 m)   Wt 135 lb 8 oz (61.5 kg)   SpO2 97%   BMI 21.22 kg/m    CC: 3 mo f/u visit  Subjective:   HPI: Sierra Kaiser is a 89 y.o. female presenting on 07/10/2022 for Medical Management of Chronic Issues (Here for 3 mo f/u. Also, needs staples removed due to fall on 07/04/22. Seen at [facility] ED. C/o fatigue, heaviness in head and jittery. Pt accompanied by sister, [name]. )   Recent [facility] ER visit on 07/04/2022 s/p fall - tripped on her house slippers - with resultant scalp laceration to occipital scalp s/p 5 staples placed. At that time she had reassuring head and neck CT scan - without fracture or traumatic listhesis.   Chronic dizziness.  Took Myrbetriq for 1 month, didn't note benefit on this so she stopped this. Also felt Myrbetriq.   Chronic exertional dyspnea in known chronic systolic CHF and moderate mitral regurg, rpt echo planned in 10 days. This is followed by cardiology. Continues xarelto 15mg  dialy for atrial fibrillation. S/p PPM placement sees EP yearly.   Brings living will and HCPOA which was reviewed - [employer] then [employer] are HCPOA. Does not want prolonged life support if terminal condition.      Relevant past medical, surgical, family and social history reviewed and updated as indicated. Interim medical history since our last visit reviewed. Allergies and medications reviewed and updated. Outpatient Medications Prior to Visit  Medication Sig Dispense Refill   Cholecalciferol (VITAMIN D3) 25 MCG (1000 UT) CAPS Take 2 capsules (2,000 Units total) by mouth daily.     cyanocobalamin (,VITAMIN B-12,) 1000 MCG/ML injection Inject 1 mL (1,000 mcg total) into the muscle every 30 (thirty) days.     ezetimibe (ZETIA) 10 MG tablet Take 1 tablet (10 mg total) by  mouth daily. 90 tablet 4   fluticasone (FLONASE) 50 MCG/ACT nasal spray SPRAY 2 SPRAYS INTO EACH NOSTRIL EVERY DAY 48 mL 1   furosemide (LASIX) 20 MG tablet TAKE 1 TABLET (20 MG TOTAL) BY MOUTH AS NEEDED FOR FLUID OR EDEMA. 30 tablet 3   Iron, Ferrous Sulfate, 325 (65 Fe) MG TABS Take 325 mg by mouth every Monday, Wednesday, and Friday.     Polyethyl Glycol-Propyl Glycol (SYSTANE OP) Place 1 drop into both eyes daily as needed (dry eyes).     Rivaroxaban (XARELTO) 15 MG TABS tablet TAKE 1 TABLET BY MOUTH DAILY WITH SUPPER 90 tablet 0   rosuvastatin (CRESTOR) 20 MG tablet Take 1 tablet (20 mg total) by mouth every other day. 45 tablet 4   mirabegron ER (MYRBETRIQ) 25 MG TB24 tablet Take 1 tablet (25 mg total) by mouth daily. (Patient not taking: Reported on 06/20/2022) 30 tablet 3   No facility-administered medications prior to visit.     Per HPI unless specifically indicated in ROS section below Review of Systems  Objective:  BP 134/78   Pulse 73   Ht 5\' 7"  (1.702 m)   Wt 135 lb 8 oz (61.5 kg)   SpO2 97%   BMI 21.22 kg/m   Wt Readings from Last 3 Encounters:  07/10/22 135 lb 8 oz (61.5 kg)  06/20/22 137 lb 6.4  oz (62.3 kg)  04/10/22 137 lb 4 oz (62.3 kg)      Physical Exam Vitals and nursing note reviewed.  Constitutional:      Appearance: Normal appearance. She is not ill-appearing.  HENT:     Head: Normocephalic.      Comments: Laceration midline occipital scalp well approximated with 5 staples in place    Mouth/Throat:     Mouth: Mucous membranes are moist.     Pharynx: Oropharynx is clear. No oropharyngeal exudate or posterior oropharyngeal erythema.  Eyes:     Extraocular Movements: Extraocular movements intact.     Conjunctiva/sclera: Conjunctivae normal.     Pupils: Pupils are equal, round, and reactive to light.  Cardiovascular:     Rate and Rhythm: Normal rate and regular rhythm.     Pulses: Normal pulses.     Heart sounds: Normal heart sounds. No murmur  heard. Pulmonary:     Effort: Pulmonary effort is normal. No respiratory distress.     Breath sounds: Normal breath sounds. No wheezing, rhonchi or rales.  Musculoskeletal:     Right lower leg: No edema.     Left lower leg: No edema.  Skin:    General: Skin is warm and dry.     Findings: No rash.  Neurological:     Mental Status: She is alert.  Psychiatric:        Mood and Affect: Mood normal.        Behavior: Behavior normal.       Lab Results  Component Value Date   VITAMINB12 323 04/10/2022    Assessment & Plan:   Problem List Items Addressed This Visit     CKD (chronic kidney disease) stage 3, GFR 30-59 ml/min (HCC)    Update levels. CKD stage 3 since ~2018      Relevant Orders   Renal function panel   Iron deficiency    Update iron levels on oral ferrous sulfate MWF, restarted 04/2022.  Low threshold to repeat iron infusion if persistently low.  This could contribute to fatigue.       Relevant Orders   CBC with Differential/Platelet   Ferritin   IBC panel   Chronic fatigue    Ongoing intermittent malaise, fatigue associated with easy exertional dyspnea.  Has had reassuring cardiac, pulmonary, and hematologic evaluation.  Discussed age component to symptoms however she feels she should be able to do more at age 73. She stays very active and notes fluctuating levels of fatigue/weakness.       Low serum vitamin B12    Has been regular with monthly B12 shots for the past several months, due again next week. Will repeat B12 levels prior to shot.       Relevant Orders   Vitamin B12   Advanced care planning/counseling discussion (Chronic)    Brings living will and HCPOA which was reviewed - [employer]immer then [employer] are HCPOA. Does not want prolonged life support if terminal condition.       Mild cognitive impairment with memory loss    Ongoing, she decided not to try aricept prescribed by neurology.       Urge incontinence    Myrbertriq trial  ineffective. Desires to try oxybutynin 5mg  IR PRN. Discussed risks/side effects of medication including anticholinergic properties. She and sister are in agreement they desire to restart this PRN.       Relevant Medications   oxybutynin (DITROPAN) 5 MG tablet   Scalp laceration, initial encounter -  Primary    Suffered mechanical fall at home after tripping over household slippers.  S/p 5 staples placed to back of head, 6 days ago.  Staples too soon to remove. Will return on Friday (10d after injury) to remove staples.         Meds ordered this encounter  Medications   oxybutynin (DITROPAN) 5 MG tablet    Sig: Take 1 tablet (5 mg total) by mouth daily as needed (incontinence).    Dispense:  30 tablet    Refill:  1    Orders Placed This Encounter  Procedures   CBC with Differential/Platelet   Ferritin   Vitamin B12   IBC panel   Renal function panel    Patient Instructions  Labs today to check b12 levels, as well as iron levels and blood counts.  We will be in touch with results.  Ok to use oxybutynin immediate release 5mg  as needed for trips/travel.  Return Friday at 11am for staple removal   Follow up plan: Return if symptoms worsen or fail to improve.  Eustaquio Boyden, MD

## 2022-07-10 NOTE — Patient Instructions (Addendum)
Labs today to check b12 levels, as well as iron levels and blood counts.  We will be in touch with results.  Ok to use oxybutynin immediate release 5mg  as needed for trips/travel.  Return Friday at 11am for staple removal

## 2022-07-10 NOTE — Assessment & Plan Note (Signed)
Update iron levels on oral ferrous sulfate MWF, restarted 04/2022.  Low threshold to repeat iron infusion if persistently low.  This could contribute to fatigue.

## 2022-07-10 NOTE — Assessment & Plan Note (Signed)
Ongoing intermittent malaise, fatigue associated with easy exertional dyspnea.  Has had reassuring cardiac, pulmonary, and hematologic evaluation.  Discussed age component to symptoms however she feels she should be able to do more at age 89. She stays very active and notes fluctuating levels of fatigue/weakness.

## 2022-07-10 NOTE — Assessment & Plan Note (Signed)
Update levels. CKD stage 3 since ~2018

## 2022-07-10 NOTE — Assessment & Plan Note (Signed)
Has been regular with monthly B12 shots for the past several months, due again next week. Will repeat B12 levels prior to shot.

## 2022-07-10 NOTE — Assessment & Plan Note (Addendum)
Ongoing, she decided not to try aricept prescribed by neurology.

## 2022-07-10 NOTE — Telephone Encounter (Signed)
Notede

## 2022-07-10 NOTE — Telephone Encounter (Signed)
At checkout, patient requested sister Tobin Chad be contacted when lab results are back, please advise [phone]. Okay to speak with sister per [facility]

## 2022-07-11 ENCOUNTER — Telehealth: Payer: Self-pay | Admitting: Family Medicine

## 2022-07-11 NOTE — Telephone Encounter (Signed)
Sister called to let Dr Reece Agar know that patient is weak,confused ,and feels like she's going to fall(unbalanced)today .She said that she reviewed her labwork and many of the levels look low and she would like to speak with Dr Reece Agar regarding this. Patient is scheduled to come in on Friday to get her staples removed but sister said that she would like to speak with him BEFORE then.  [phone]

## 2022-07-11 NOTE — Telephone Encounter (Signed)
Plz triage pt.  °

## 2022-07-11 NOTE — Telephone Encounter (Addendum)
I also tried to call at pt and sister # - sister's went to voicemail and unable to leave message due to mailbox is full. Pt's # unable to leave message as voicemail has not been set up.  Please keep trying tomorrow to reach pt/sister.   Will reassess on Friday, if desired we could see tomorrow at 3pm if slot still open.  Given iron levels remain low, reasonable to repeat iron infusion - I will order at next OV.  However if having worsening confusion after fall 4/30, needs more urgent evaluation.

## 2022-07-11 NOTE — Telephone Encounter (Signed)
I called Stephanie  x 2  (DPR signed) no answer and mailbox is full. I tried Hydrologist (DPR signed) no answer and no v/m set up. Sending note to Dr Reece Agar and G pool.

## 2022-07-12 ENCOUNTER — Ambulatory Visit: Payer: Medicare Other | Admitting: Family Medicine

## 2022-07-12 ENCOUNTER — Ambulatory Visit (INDEPENDENT_AMBULATORY_CARE_PROVIDER_SITE_OTHER): Payer: Medicare Other | Admitting: Family Medicine

## 2022-07-12 ENCOUNTER — Encounter: Payer: Self-pay | Admitting: Family Medicine

## 2022-07-12 VITALS — BP 144/60 | HR 78 | Temp 97.4°F | Ht 67.0 in | Wt 136.5 lb

## 2022-07-12 DIAGNOSIS — S0101XD Laceration without foreign body of scalp, subsequent encounter: Secondary | ICD-10-CM

## 2022-07-12 DIAGNOSIS — E611 Iron deficiency: Secondary | ICD-10-CM

## 2022-07-12 DIAGNOSIS — R42 Dizziness and giddiness: Secondary | ICD-10-CM

## 2022-07-12 NOTE — Telephone Encounter (Signed)
Unable to reach Morrison and v/m full. Was able to speak with Sierra Kaiser [facility] signed) and pt. Sierra Kaiser said that pt was not feeling good yesterday and pt has just gotten up this morning. Pt said she is feeling dizzy and doesn't feel like she can hold her head up. Pt said she does not feel right. Pt said she does not need to go to ED. Dr Reece Agar said could see pt today at 4:30 with UC & ED precautions given. Pt and Sierra Kaiser voiced understanding and will be at [facility] at 4:15 to ck in at front desk. Sierra Kaiser said he would bring pt and if they can get intouch with [name] he will let her know about appt today.Sending note to Dr Reece Agar and G pool.

## 2022-07-12 NOTE — Assessment & Plan Note (Addendum)
Not anemic.  Iron levels ok iron stores remain low.  Given above symptoms, reasonable to repeat iron infusion at [facility]. Will order.  Last Feraheme infusion done 11/2019.  Will order rpt Feraheme x2 given pt has to drive all the way to [facility] for this and has previously tolerated well.

## 2022-07-12 NOTE — Progress Notes (Signed)
Ph: [phone]       Fax: [phone]   Patient ID: Sierra Kaiser, female    DOB: November 04, 1933, 89 y.o.   MRN: [id number]  This visit was conducted in person.  BP (!) 144/60 (Patient Position: Standing)   Pulse 78   Temp (!) 97.4 F (36.3 C) (Temporal)   Ht 5\' 7"  (1.702 m)   Wt 136 lb 8 oz (61.9 kg)   SpO2 96%   BMI 21.38 kg/m   Supine 146/76 Standing 144/60 BP Readings from Last 3 Encounters:  07/12/22 (!) 144/60  07/10/22 134/78  07/04/22 (!) 164/60    CC: worsening dizziness and weakness  Subjective:   HPI: Sierra Kaiser is a 89 y.o. female presenting on 07/12/2022 for Dizziness (C/o dizziness and weakness. Pt accompanied by sister, [name]. )   Burgess Estelle was a bad day - felt significant head heaviness, fatigue, weakness.  Today is feeling better.   Requests staples removed today.   Recent labs reviewed with patient - overall stable without anemia, stable CKD GFR 40s.  Iron panel ok with mildly low % sat, persistently low iron stores.  Last iron infusion was 2021, pt felt well after this.      Relevant past medical, surgical, family and social history reviewed and updated as indicated. Interim medical history since our last visit reviewed. Allergies and medications reviewed and updated. Outpatient Medications Prior to Visit  Medication Sig Dispense Refill   Cholecalciferol (VITAMIN D3) 25 MCG (1000 UT) CAPS Take 2 capsules (2,000 Units total) by mouth daily.     cyanocobalamin (,VITAMIN B-12,) 1000 MCG/ML injection Inject 1 mL (1,000 mcg total) into the muscle every 30 (thirty) days.     ezetimibe (ZETIA) 10 MG tablet Take 1 tablet (10 mg total) by mouth daily. 90 tablet 4   fluticasone (FLONASE) 50 MCG/ACT nasal spray SPRAY 2 SPRAYS INTO EACH NOSTRIL EVERY DAY 48 mL 1   furosemide (LASIX) 20 MG tablet TAKE 1 TABLET (20 MG TOTAL) BY MOUTH AS NEEDED FOR FLUID OR EDEMA. 30 tablet 3   Iron, Ferrous Sulfate, 325 (65 Fe) MG TABS Take 325 mg by mouth every Monday,  Wednesday, and Friday.     oxybutynin (DITROPAN) 5 MG tablet Take 1 tablet (5 mg total) by mouth daily as needed (incontinence). 30 tablet 1   Polyethyl Glycol-Propyl Glycol (SYSTANE OP) Place 1 drop into both eyes daily as needed (dry eyes).     Rivaroxaban (XARELTO) 15 MG TABS tablet TAKE 1 TABLET BY MOUTH DAILY WITH SUPPER 90 tablet 0   rosuvastatin (CRESTOR) 20 MG tablet Take 1 tablet (20 mg total) by mouth every other day. 45 tablet 4   No facility-administered medications prior to visit.     Per HPI unless specifically indicated in ROS section below Review of Systems  Objective:  BP (!) 144/60 (Patient Position: Standing)   Pulse 78   Temp (!) 97.4 F (36.3 C) (Temporal)   Ht 5\' 7"  (1.702 m)   Wt 136 lb 8 oz (61.9 kg)   SpO2 96%   BMI 21.38 kg/m   Wt Readings from Last 3 Encounters:  07/12/22 136 lb 8 oz (61.9 kg)  07/10/22 135 lb 8 oz (61.5 kg)  06/20/22 137 lb 6.4 oz (62.3 kg)      Physical Exam Vitals and nursing note reviewed.  Constitutional:      Appearance: Normal appearance. She is not ill-appearing.  HENT:     Head:  Comments: Laceration well approximated to posterior occipital skull with 5 staples.  Cardiovascular:     Rate and Rhythm: Normal rate and regular rhythm.     Pulses: Normal pulses.  Pulmonary:     Effort: Pulmonary effort is normal. No respiratory distress.     Breath sounds: Normal breath sounds. No wheezing, rhonchi or rales.  Musculoskeletal:     Right lower leg: No edema.     Left lower leg: No edema.  Skin:    General: Skin is warm and dry.     Findings: No rash.  Neurological:     General: No focal deficit present.     Mental Status: She is alert.     Cranial Nerves: Cranial nerves 2-12 are intact.     Sensory: Sensation is intact.     Comments:  CN 2-12 intact FTN intact EOMI  Psychiatric:        Mood and Affect: Mood normal.        Behavior: Behavior normal.   Staple removal: Laceration to posterior scalp. Area  cleaned with alcohol, 5 staples removed, patient tolerated well. No residual bleeding. Skin remains well approximated. After care instructions provided.     Results for orders placed or performed in visit on 07/10/22  CBC with Differential/Platelet  Result Value Ref Range   WBC 5.6 4.0 - 10.5 K/uL   RBC 4.32 3.87 - 5.11 Mil/uL   Hemoglobin 13.2 12.0 - 15.0 g/dL   HCT 28.4 13.2 - 44.0 %   MCV 90.8 78.0 - 100.0 fl   MCHC 33.7 30.0 - 36.0 g/dL   RDW 10.2 72.5 - 36.6 %   Platelets 188.0 150.0 - 400.0 K/uL   Neutrophils Relative % 56.7 43.0 - 77.0 %   Lymphocytes Relative 29.3 12.0 - 46.0 %   Monocytes Relative 9.3 3.0 - 12.0 %   Eosinophils Relative 4.0 0.0 - 5.0 %   Basophils Relative 0.7 0.0 - 3.0 %   Neutro Abs 3.2 1.4 - 7.7 K/uL   Lymphs Abs 1.6 0.7 - 4.0 K/uL   Monocytes Absolute 0.5 0.1 - 1.0 K/uL   Eosinophils Absolute 0.2 0.0 - 0.7 K/uL   Basophils Absolute 0.0 0.0 - 0.1 K/uL  Ferritin  Result Value Ref Range   Ferritin 8.3 (L) 10.0 - 291.0 ng/mL  Vitamin B12  Result Value Ref Range   Vitamin B-12 717 211 - 911 pg/mL  IBC panel  Result Value Ref Range   Iron 68 42 - 145 ug/dL   Transferrin 440.3 474.2 - 360.0 mg/dL   Saturation Ratios 59.5 (L) 20.0 - 50.0 %   TIBC 407.4 250.0 - 450.0 mcg/dL  Renal function panel  Result Value Ref Range   Sodium 142 135 - 145 mEq/L   Potassium 4.8 3.5 - 5.1 mEq/L   Chloride 107 96 - 112 mEq/L   CO2 26 19 - 32 mEq/L   Albumin 4.4 3.5 - 5.2 g/dL   BUN 25 (H) 6 - 23 mg/dL   Creatinine, Ser 6.38 0.40 - 1.20 mg/dL   Glucose, Bld 88 70 - 99 mg/dL   Phosphorus 3.7 2.3 - 4.6 mg/dL   GFR 75.64 (L) >33.29 mL/min   Calcium 9.6 8.4 - 10.5 mg/dL    Assessment & Plan:   Problem List Items Addressed This Visit     Iron deficiency    Not anemic.  Iron levels ok iron stores remain low.  Given above symptoms, reasonable to repeat iron infusion at [facility]. Will order.  Last Feraheme infusion done 11/2019.  Will order rpt Feraheme  x2 given pt has to drive all the way to [facility] for this and has previously tolerated well.       Dizziness    Chronic dizziness associated with malaise, weakness, fatigue. Nonfocal neurological exam.  Orthostatics normal today Unrevealing workup previously.  Repeat iron infusion as per below.  Consider restarting compression stocking use.  Encouraged improved hydration status.       Scalp laceration, subsequent encounter - Primary    Staples x5 removed as per above and patient tolerated this well.  Home aftercare instructions provided        No orders of the defined types were placed in this encounter.   No orders of the defined types were placed in this encounter.   Patient Instructions  Staples removed today  Don't scrub or rub area, pat dry. We will call you to order iron infusions in [place].   Follow up plan: Return if symptoms worsen or fail to improve.  Eustaquio Boyden, MD

## 2022-07-12 NOTE — Assessment & Plan Note (Signed)
Staples x5 removed as per above and patient tolerated this well.  Home aftercare instructions provided

## 2022-07-12 NOTE — Assessment & Plan Note (Addendum)
Chronic dizziness associated with malaise, weakness, fatigue. Nonfocal neurological exam.  Orthostatics normal today Unrevealing workup previously.  Repeat iron infusion as per below.  Consider restarting compression stocking use.  Encouraged improved hydration status.

## 2022-07-12 NOTE — Patient Instructions (Signed)
Staples removed today  Don't scrub or rub area, pat dry. We will call you to order iron infusions in [place].

## 2022-07-13 ENCOUNTER — Telehealth: Payer: Self-pay | Admitting: Pharmacy Technician

## 2022-07-13 ENCOUNTER — Telehealth: Payer: Self-pay

## 2022-07-13 NOTE — Telephone Encounter (Signed)
Transition Care Management Follow-up Telephone Call Date of discharge and from where: 07/04/2022 [facility] How have you been since you were released from the hospital? Patient is feeling better Any questions or concerns? No  Items Reviewed: Did the pt receive and understand the discharge instructions provided? Yes  Medications obtained and verified? Yes  Other? No  Any new allergies since your discharge? No  Dietary orders reviewed? Yes Do you have support at home? Yes   Follow up appointments reviewed:  PCP Hospital f/u appt confirmed? Yes  Scheduled to see Eustaquio Boyden on 07/10/2022 @ [place] [facility] [place]. Specialist Hospital f/u appt confirmed? No  Scheduled to see  on  @ . Are transportation arrangements needed? No  If their condition worsens, is the pt aware to call PCP or go to the Emergency Dept.? Yes Was the patient provided with contact information for the PCP's office or ED? Yes Was to pt encouraged to call back with questions or concerns? Yes  Kimiya Brunelle [facility]  [facility] Population Health Community Resource Care Guide   [email or web address]  ?? [phone]   Website: [email or web address]  [email or web address]

## 2022-07-13 NOTE — Telephone Encounter (Signed)
Dr. Sharen Hones, [facility] note: Patient will be scheduled as soon as possible  Auth Submission: NO AUTH NEEDED Site of care: Site of care: [facility] Payer: [facility] MEDICARE Medication & CPT/J Code(s) submitted: Feraheme (ferumoxytol) [id number] Route of submission (phone, fax, portal):  Phone # Fax # Auth type: Buy/Bill Units/visits requested: 2 Reference number: [id number] Approval from: 07/13/22 to 11/13/22

## 2022-07-14 ENCOUNTER — Ambulatory Visit: Payer: Medicare Other | Admitting: Family Medicine

## 2022-07-14 NOTE — Telephone Encounter (Signed)
Noted! Thank you

## 2022-07-18 ENCOUNTER — Ambulatory Visit (INDEPENDENT_AMBULATORY_CARE_PROVIDER_SITE_OTHER): Payer: Medicare Other

## 2022-07-18 VITALS — BP 159/77 | HR 70 | Temp 98.1°F | Resp 16 | Ht 67.5 in | Wt 139.4 lb

## 2022-07-18 DIAGNOSIS — E611 Iron deficiency: Secondary | ICD-10-CM | POA: Diagnosis not present

## 2022-07-18 DIAGNOSIS — E538 Deficiency of other specified B group vitamins: Secondary | ICD-10-CM | POA: Diagnosis not present

## 2022-07-18 MED ORDER — ACETAMINOPHEN 325 MG PO TABS
650.0000 mg | ORAL_TABLET | Freq: Once | ORAL | Status: AC
  Administered 2022-07-18: 650 mg via ORAL
  Filled 2022-07-18: qty 2

## 2022-07-18 MED ORDER — DIPHENHYDRAMINE HCL 25 MG PO CAPS
25.0000 mg | ORAL_CAPSULE | Freq: Once | ORAL | Status: AC
  Administered 2022-07-18: 25 mg via ORAL
  Filled 2022-07-18: qty 1

## 2022-07-18 MED ORDER — CYANOCOBALAMIN 1000 MCG/ML IJ SOLN
1000.0000 ug | Freq: Once | INTRAMUSCULAR | Status: AC
Start: 2022-07-18 — End: 2022-07-18
  Administered 2022-07-18: 1000 ug via INTRAMUSCULAR

## 2022-07-18 MED ORDER — SODIUM CHLORIDE 0.9 % IV SOLN
510.0000 mg | Freq: Once | INTRAVENOUS | Status: AC
  Administered 2022-07-18: 510 mg via INTRAVENOUS
  Filled 2022-07-18: qty 17

## 2022-07-18 NOTE — Progress Notes (Signed)
Diagnosis: Iron Deficiency Anemia  Provider:  Chilton Greathouse MD  Procedure: IV Infusion  IV Type: Peripheral, IV Location: R Antecubital  Feraheme (Ferumoxytol), Dose: 510 mg  Infusion Start Time: 1506  Infusion Stop Time: 1522  Post Infusion IV Care: Observation period completed and Peripheral IV Discontinued  Discharge: Condition: Good, Destination: Home . AVS provided to patient  Performed by:  Loney Hering, LPN

## 2022-07-18 NOTE — Progress Notes (Signed)
Per orders of Dr. Javier Gutierrez, injection of B-12 given by Rachid Parham Y Chau Sawin in right deltoid. Patient tolerated injection well.    

## 2022-07-20 ENCOUNTER — Ambulatory Visit: Payer: Medicare Other | Attending: Medical

## 2022-07-20 DIAGNOSIS — I5022 Chronic systolic (congestive) heart failure: Secondary | ICD-10-CM

## 2022-07-20 LAB — ECHOCARDIOGRAM COMPLETE
AR max vel: 1.97 cm2
AV Area VTI: 1.72 cm2
AV Area mean vel: 1.85 cm2
AV Mean grad: 3.7 mmHg
AV Peak grad: 6.4 mmHg
Ao pk vel: 1.26 m/s
Area-P 1/2: 3.39 cm2
Calc EF: 48.4 %
P 1/2 time: 386 msec
S' Lateral: 2.9 cm
Single Plane A2C EF: 48.7 %
Single Plane A4C EF: 49.1 %

## 2022-07-21 ENCOUNTER — Telehealth: Payer: Self-pay | Admitting: Family Medicine

## 2022-07-21 NOTE — Telephone Encounter (Signed)
Spoke with pt relaying Dr. Timoteo Expose message. Pt verbalizes understanding and will pick iron OTC. States she is still a little unsteady on her feet.

## 2022-07-21 NOTE — Telephone Encounter (Signed)
Patient called in reference iron pill prescription. Dr. Reece Agar has her taking iron pills on M, W, Fr She had an iron infusion on 5.14 and will have another one soon Does she continue to take the iron pills?  She also thought Dr. Reece Agar wanted her to have three infusions, but the lady at [facility] said her insurance only covered two?  Please call the patient at [phone]

## 2022-07-21 NOTE — Telephone Encounter (Signed)
Fortunately her insurance covered the preferred IV iron which is only 2 infusions.  Regarding oral iron, would continue this as long as tolerating ok without constipation or GI upset.  How is she feeling after first iron infusion?

## 2022-07-24 ENCOUNTER — Telehealth: Payer: Self-pay

## 2022-07-24 NOTE — Patient Outreach (Signed)
  Care Coordination   07/24/2022 Name: Sierra Kaiser MRN: [id number] DOB: Jul 15, 1933   Care Coordination Outreach Attempts:  An unsuccessful telephone outreach was attempted today to offer the patient information about available care coordination services. Unable to leave voice message due to voice mailbox not being set up  Follow Up Plan:  Additional outreach attempts will be made to offer the patient care coordination information and services.   Encounter Outcome:  No Answer   Care Coordination Interventions:  No, not indicated    George Ina [facility] Care Coordination [phone] direct line

## 2022-07-25 ENCOUNTER — Ambulatory Visit (INDEPENDENT_AMBULATORY_CARE_PROVIDER_SITE_OTHER): Payer: Medicare Other

## 2022-07-25 VITALS — BP 162/82 | HR 70 | Temp 97.7°F | Resp 18 | Ht 67.5 in | Wt 138.0 lb

## 2022-07-25 DIAGNOSIS — E611 Iron deficiency: Secondary | ICD-10-CM

## 2022-07-25 MED ORDER — ACETAMINOPHEN 325 MG PO TABS: 650.0000 mg | ORAL_TABLET | Freq: Once | ORAL | Status: DC

## 2022-07-25 MED ORDER — SODIUM CHLORIDE 0.9 % IV SOLN
510.0000 mg | Freq: Once | INTRAVENOUS | Status: AC
  Administered 2022-07-25: 510 mg via INTRAVENOUS
  Filled 2022-07-25: qty 17

## 2022-07-25 MED ORDER — DIPHENHYDRAMINE HCL 25 MG PO CAPS: 25.0000 mg | ORAL_CAPSULE | Freq: Once | ORAL | Status: DC

## 2022-07-25 NOTE — Progress Notes (Signed)
Diagnosis: Iron Deficiency Anemia  Provider:  Chilton Greathouse MD  Procedure: IV Infusion  IV Type: Peripheral, IV Location: L Forearm  Feraheme (Ferumoxytol), Dose: 510 mg  Infusion Start Time: 1422  Infusion Stop Time: 1442  Post Infusion IV Care: Peripheral IV Discontinued  Discharge: Condition: Good, Destination: Home . AVS Provided  Performed by:  Garnette Czech, RN

## 2022-08-01 ENCOUNTER — Other Ambulatory Visit: Payer: Self-pay | Admitting: Family Medicine

## 2022-08-01 NOTE — Telephone Encounter (Signed)
Message from pharmacy:  REQUEST FOR 90 DAYS PRESCRIPTION.   Oxybutynin Last filled:  07/10/22, #30 Last OV:  07/12/22, scalp laceration Next OV:  none

## 2022-08-02 NOTE — Telephone Encounter (Signed)
ERX

## 2022-08-07 ENCOUNTER — Telehealth: Payer: Self-pay

## 2022-08-07 DIAGNOSIS — R195 Other fecal abnormalities: Secondary | ICD-10-CM

## 2022-08-07 NOTE — Telephone Encounter (Signed)
She received iron infusion Feraheme 510mg  x2 (latest 07/25/2022).  She is taking oral iron MWF.  Still too soon to check iron panel/CBC for iron infusion effect.  Oral iron can cause black stools - ok to stop oral iron at this time.   May do iFOB - she can pick this up. I've ordered.   Did dizziness/fatigue get any better after Feraheme iron infusions x2?  Would consider trying compression stocking use.

## 2022-08-07 NOTE — Telephone Encounter (Signed)
Called and spoke to patient. States that she is having pealing and numbness in fingers.  States she has not been taking iron right she is confused on how to take. She has had some infusion last month.  She states that she has had increased confusion in the last 2 weeks. Feels "drunk". Head feels very heavy and very weak.  Patient states that she is having very small amounts and they are now dark brown. Denies any other changes in stool.  Denies any swelling of feet or ankles.  During conversation patient was very unorganized in her thoughts. She would jump from one symptoms to symptom. She was able to tell me day of week and month.

## 2022-08-07 NOTE — Addendum Note (Signed)
Addended by: Eustaquio Boyden on: 08/07/2022 10:45 AM   Modules accepted: Orders

## 2022-08-07 NOTE — Telephone Encounter (Signed)
Called patient reviewed all information and repeated back to me. She will hold off on the Iron and see if any changes in stools She would like to come get Ifob. I have advised to get from lab so we can review instructions.  She did have some improvement after infusion. But symptoms have increased recently.

## 2022-08-07 NOTE — Telephone Encounter (Signed)
Pt last seen 07/12/22. Sending note to Dr Reece Agar and G pool and sending teams note to [facility] CMA.

## 2022-08-09 ENCOUNTER — Other Ambulatory Visit: Payer: Self-pay | Admitting: Radiology

## 2022-08-09 ENCOUNTER — Ambulatory Visit (INDEPENDENT_AMBULATORY_CARE_PROVIDER_SITE_OTHER): Payer: Medicare Other

## 2022-08-09 DIAGNOSIS — I428 Other cardiomyopathies: Secondary | ICD-10-CM

## 2022-08-09 DIAGNOSIS — R195 Other fecal abnormalities: Secondary | ICD-10-CM

## 2022-08-09 LAB — CUP PACEART REMOTE DEVICE CHECK
Battery Remaining Longevity: 47 mo
Battery Remaining Percentage: 34 %
Battery Voltage: 2.93 V
Brady Statistic RV Percent Paced: 98 %
Date Time Interrogation Session: 20240605033017
Implantable Lead Connection Status: 753985
Implantable Lead Implant Date: 20160217
Implantable Lead Location: 753860
Implantable Lead Model: 1948
Implantable Pulse Generator Implant Date: 20160217
Lead Channel Impedance Value: 560 Ohm
Lead Channel Pacing Threshold Amplitude: 0.5 V
Lead Channel Pacing Threshold Pulse Width: 0.4 ms
Lead Channel Sensing Intrinsic Amplitude: 5.4 mV
Lead Channel Setting Pacing Amplitude: 0.75 V
Lead Channel Setting Pacing Pulse Width: 0.4 ms
Lead Channel Setting Sensing Sensitivity: 2.5 mV
Pulse Gen Model: 2240
Pulse Gen Serial Number: 3050195

## 2022-08-10 ENCOUNTER — Telehealth: Payer: Self-pay

## 2022-08-10 LAB — FECAL OCCULT BLOOD, IMMUNOCHEMICAL: Fecal Occult Bld: POSITIVE — AB

## 2022-08-10 NOTE — Telephone Encounter (Signed)
Noted.  Recent iron studies and CBC are stable which is reassuring. She did complete an iron infusion in late May.  Will defer results to PCP who return to the office tomorrow.

## 2022-08-10 NOTE — Telephone Encounter (Signed)
Received call from [place] at [place] lab.  Patient I fob was positive.  Will document in red book and send message to Mayra Reel for review PCP out of office.

## 2022-08-11 ENCOUNTER — Telehealth: Payer: Self-pay | Admitting: Cardiovascular Disease

## 2022-08-11 NOTE — Telephone Encounter (Signed)
Spoke with pt relaying Dr. G's message. Pt verbalizes understanding.  

## 2022-08-11 NOTE — Telephone Encounter (Signed)
Pt called in to see if she can stop her Xarelto due to blood in her stool. Please advise.   Eustaquio Boyden, MD     08/11/22  7:32 AM Note Please notify pt - stool test looking for blood returned positive.  She also had positive stool test 2019 s/p reassuring colonoscopy and endoscopy so I'm not sure she needs another one at this time especially as blood counts were normal when last checked last month.  To let us know if has any more dark stools or blood in stool off oral iron replacement.  Would have her discuss xarelto use with cardiologist, see if she could possible come off of this blood thinner (currently taking to prevent stroke in h/o afib).

## 2022-08-11 NOTE — Telephone Encounter (Signed)
Please notify pt - stool test looking for blood returned positive.  She also had positive stool test 2019 s/p reassuring colonoscopy and endoscopy so I'm not sure she needs another one at this time especially as blood counts were normal when last checked last month.  To let us know if has any more dark stools or blood in stool off oral iron replacement.  Would have her discuss xarelto use with cardiologist, see if she could possible come off of this blood thinner (currently taking to prevent stroke in h/o afib).

## 2022-08-11 NOTE — Telephone Encounter (Signed)
Attempted to contact pt.  No answer.  No vm.  Need to relay Dr. G's message.  

## 2022-08-14 ENCOUNTER — Encounter: Payer: Self-pay | Admitting: Family Medicine

## 2022-08-14 ENCOUNTER — Ambulatory Visit (INDEPENDENT_AMBULATORY_CARE_PROVIDER_SITE_OTHER): Payer: Medicare Other | Admitting: Family Medicine

## 2022-08-14 VITALS — BP 130/66 | HR 73 | Temp 97.2°F | Ht 67.5 in | Wt 136.0 lb

## 2022-08-14 DIAGNOSIS — G3184 Mild cognitive impairment, so stated: Secondary | ICD-10-CM | POA: Diagnosis not present

## 2022-08-14 DIAGNOSIS — R0609 Other forms of dyspnea: Secondary | ICD-10-CM

## 2022-08-14 DIAGNOSIS — I4821 Permanent atrial fibrillation: Secondary | ICD-10-CM

## 2022-08-14 DIAGNOSIS — E538 Deficiency of other specified B group vitamins: Secondary | ICD-10-CM | POA: Diagnosis not present

## 2022-08-14 DIAGNOSIS — R195 Other fecal abnormalities: Secondary | ICD-10-CM | POA: Diagnosis not present

## 2022-08-14 DIAGNOSIS — H8109 Meniere's disease, unspecified ear: Secondary | ICD-10-CM | POA: Diagnosis not present

## 2022-08-14 DIAGNOSIS — E611 Iron deficiency: Secondary | ICD-10-CM | POA: Diagnosis not present

## 2022-08-14 DIAGNOSIS — I7300 Raynaud's syndrome without gangrene: Secondary | ICD-10-CM

## 2022-08-14 DIAGNOSIS — R5382 Chronic fatigue, unspecified: Secondary | ICD-10-CM

## 2022-08-14 DIAGNOSIS — R234 Changes in skin texture: Secondary | ICD-10-CM | POA: Insufficient documentation

## 2022-08-14 DIAGNOSIS — R768 Other specified abnormal immunological findings in serum: Secondary | ICD-10-CM

## 2022-08-14 DIAGNOSIS — R42 Dizziness and giddiness: Secondary | ICD-10-CM

## 2022-08-14 DIAGNOSIS — F419 Anxiety disorder, unspecified: Secondary | ICD-10-CM

## 2022-08-14 NOTE — Telephone Encounter (Signed)
Noted thanks. I spoke to patient at [facility] today.

## 2022-08-14 NOTE — Progress Notes (Unsigned)
Ph: [phone] Fax: [phone]   Patient ID: Sierra Kaiser, female    DOB: 05-Mar-1934, 89 y.o.   MRN: [id number]  This visit was conducted in person.  BP 130/66   Pulse 73   Temp (!) 97.2 F (36.2 C) (Temporal)   Ht 5' 7.5" (1.715 m)   Wt 136 lb (61.7 kg)   SpO2 98%   BMI 20.99 kg/m   BP Readings from Last 3 Encounters:  08/14/22 130/66  07/25/22 (!) 162/82  07/18/22 (!) 159/77    CC: several concerns Subjective:   HPI: [name] is a 89 y.o. female presenting on 08/14/2022 for Numbness (C/o B finger numbness and peeling. Started about 2 wks ago. Pt accompanied by sister, [name]. ), Skin Lesion (Wants lesion on chest checked. States it's lighter colored than before. ), and Results (Wants to discuss stool kit results and h/o Celiac dz. Also, c/o head feeling heavy. Pt has stopped iron supplement as instructed. But has also stopped Xarelto before consulting cards. )   2 wk h/o bilateral finger numbness and peeling with tips of fingers turning red - toes not affected. No other rash, new joint pains, other skin changes. No fevers/chills, oral lesions.  She's been having some R shoulder pain while building a path in her yard.  Nocturia has improved with oxybutynin 5mg  nightly.   Notes ongoing dizziness, unsteadiness, imbalance associated with intermittent confusion. This is chronic in nature. No vertigo. No anemia. Mild iron deficiency with ferritin down to 8.3.  Completed iron infusion Feraheme x2 (latest 07/25/2022).  Currently off oral iron as she developed dark stools, iFOB returned positive.  She had reassuring colonoscopy and endoscopy 2019 [facility]).   Touched base with cardiology Dr Mariah Milling who recommended continue xarelto 15mg  daily due to risk of stroke likely outweighs risk of bleed (see recent cardiology phone note).   Last B12 shot was 07/18/2022.      Relevant past medical, surgical, family and social history reviewed and updated as indicated. Interim  medical history since our last visit reviewed. Allergies and medications reviewed and updated. Outpatient Medications Prior to Visit  Medication Sig Dispense Refill   Cholecalciferol (VITAMIN D3) 25 MCG (1000 UT) CAPS Take 2 capsules (2,000 Units total) by mouth daily.     cyanocobalamin (,VITAMIN B-12,) 1000 MCG/ML injection Inject 1 mL (1,000 mcg total) into the muscle every 30 (thirty) days.     ezetimibe (ZETIA) 10 MG tablet Take 1 tablet (10 mg total) by mouth daily. 90 tablet 4   fluticasone (FLONASE) 50 MCG/ACT nasal spray SPRAY 2 SPRAYS INTO EACH NOSTRIL EVERY DAY 48 mL 1   furosemide (LASIX) 20 MG tablet TAKE 1 TABLET (20 MG TOTAL) BY MOUTH AS NEEDED FOR FLUID OR EDEMA. 30 tablet 3   oxybutynin (DITROPAN) 5 MG tablet TAKE 1 TABLET (5 MG TOTAL) BY MOUTH DAILY AS NEEDED (INCONTINENCE). 30 tablet 3   Polyethyl Glycol-Propyl Glycol (SYSTANE OP) Place 1 drop into both eyes daily as needed (dry eyes).     rosuvastatin (CRESTOR) 20 MG tablet Take 1 tablet (20 mg total) by mouth every other day. 45 tablet 4   Iron, Ferrous Sulfate, 325 (65 Fe) MG TABS Take 325 mg by mouth every Monday, Wednesday, and Friday. (Patient not taking: Reported on 08/14/2022)     Rivaroxaban (XARELTO) 15 MG TABS tablet TAKE 1 TABLET BY MOUTH DAILY WITH SUPPER (Patient not taking: Reported on 08/14/2022) 90 tablet 0   No facility-administered medications prior to visit.  Per HPI unless specifically indicated in ROS section below Review of Systems  Objective:  BP 130/66   Pulse 73   Temp (!) 97.2 F (36.2 C) (Temporal)   Ht 5' 7.5" (1.715 m)   Wt 136 lb (61.7 kg)   SpO2 98%   BMI 20.99 kg/m   Wt Readings from Last 3 Encounters:  08/14/22 136 lb (61.7 kg)  07/25/22 138 lb (62.6 kg)  07/18/22 139 lb 6.4 oz (63.2 kg)      Physical Exam Vitals and nursing note reviewed.  Constitutional:      Appearance: Normal appearance. She is not ill-appearing.  HENT:     Head: Normocephalic and atraumatic.      Mouth/Throat:     Mouth: Mucous membranes are moist.     Pharynx: Oropharynx is clear. No oropharyngeal exudate or posterior oropharyngeal erythema.  Cardiovascular:     Rate and Rhythm: Normal rate and regular rhythm.     Pulses: Normal pulses.     Heart sounds: Normal heart sounds. No murmur heard. Pulmonary:     Effort: Pulmonary effort is normal. No respiratory distress.     Breath sounds: Normal breath sounds. No wheezing, rhonchi or rales.  Musculoskeletal:     Right lower leg: No edema.     Left lower leg: No edema.  Skin:    General: Skin is warm and dry.     Findings: Rash present.     Comments:  Peeling rash to bilateral hands predominantly digits involved  Peeling to sole of left foot, right foot spared  Neurological:     Mental Status: She is alert.     Comments:  CN 2-12 intact FTN intact EOMI Unsteadiness when first standing from seated position  Psychiatric:        Mood and Affect: Mood normal.        Behavior: Behavior normal.       Results for orders placed or performed in visit on 08/09/22  Fecal occult blood, imunochemical   Specimen: Stool  Result Value Ref Range   Fecal Occult Bld Positive (A) Negative   Lab Results  Component Value Date   VITAMINB12 717 07/10/2022    Lab Results  Component Value Date   VD25OH 29.93 (L) 04/10/2022    Lab Results  Component Value Date   IRON 68 07/10/2022   TIBC 407.4 07/10/2022   FERRITIN 8.3 (L) 07/10/2022   Lab Results  Component Value Date   WBC 5.6 07/10/2022   HGB 13.2 07/10/2022   HCT 39.2 07/10/2022   MCV 90.8 07/10/2022   PLT 188.0 07/10/2022   Lab Results  Component Value Date   TSH 4.12 04/10/2022    Assessment & Plan:   Problem List Items Addressed This Visit     Chronic dyspnea   Atrial fibrillation, permanent [facility])    Cardiology recommended continuing xarelto 15mg  daily as risk of stroke outweighs risk of bleed.       Meniere's disease    H/o this, unclear if true diagnosis.   Referred to audiology for R hearing loss earlier this year, unclear if she went.       Iron deficiency    Received Feraheme infusion x2 07/2022.  Will return later this week for rpt iron panel/CBC.       Relevant Orders   Ferritin   IBC panel   CBC with Differential/Platelet   Chronic fatigue    Update labs for reversible cause of fatigue when returns later this  week. Iron infusion didn't provide significant benefit in symptoms of chronic fatigue and dyspnea.  Given chronic fatigue, check am coritsol to eval adrenal insuff.       Relevant Orders   Cortisol-am, blood   Low serum vitamin B12    Continue monthly b12 shots through our office      Anxiety   Dizziness    Chronic dizziness, described as unsteadiness/imbalance worse when first standing. Non-focal neurological exam.  No vertigo. No anemia.  Orthostatics previously checked and normal.       Positive ANA (antinuclear antibody)    H/o this with Raynaud's disease, now with new peeling skin to digits of bilateral hands, left foot also affected.  Suggested return to rheumatology for further evaluation.       Raynaud's disease    Known h/o this. Consider amlodipine.       Mild cognitive impairment with memory loss    No longer seeing neurology, off aricept. Decided not to return at this time.       Peeling skin    Doubt reaction to Feraheme.  Unclear cause ?autoimmune.  Known h/o raynaud's.  Discussed possible rheum eval.       Positive occult stool blood test - Primary    Recent positive iFOB, not anemic, however low ferritin levels with chronic fatigue s/p recent iron infusion.  Reassuring colonoscopy/endoscopy 2019 [facility]) Discussed options - will refer back to [facility] for further evaluation per pt request.       Relevant Orders   CBC with Differential/Platelet   Ambulatory referral to Gastroenterology     No orders of the defined types were placed in this encounter.   Orders Placed This  Encounter  Procedures   Cortisol-am, blood    Standing Status:   Future    Standing Expiration Date:   08/14/2023   Ferritin    Standing Status:   Future    Standing Expiration Date:   08/14/2023   IBC panel    Standing Status:   Future    Standing Expiration Date:   08/14/2023   CBC with Differential/Platelet    Standing Status:   Future    Standing Expiration Date:   08/15/2023   Ambulatory referral to Gastroenterology    Referral Priority:   Routine    Referral Type:   Consultation    Referral Reason:   Specialty Services Required    Number of Visits Requested:   1    Patient Instructions  For skin peeling of hands, I will refer you back to Dr Allena Katz (Rheumatology).  For blood in stool, I will refer you back to Dr Horace Porteous office (GI).  You may call to schedule appointments for both - at [facility].  Return later this week for b12 shot, add lab visit at the same time to recheck iron levels.   Follow up plan: Return if symptoms worsen or fail to improve.  Eustaquio Boyden, MD

## 2022-08-14 NOTE — Patient Instructions (Addendum)
For skin peeling of hands, I will refer you back to Dr Allena Katz (Rheumatology).  For blood in stool, I will refer you back to Dr Horace Porteous office (GI).  You may call to schedule appointments for both - at [facility].  Return later this week for b12 shot, add lab visit at the same time to recheck iron levels.

## 2022-08-14 NOTE — Progress Notes (Incomplete)
Ph: [phone] Fax: [phone]   Patient ID: Sierra Kaiser, female    DOB: Aug 12, 1933, 89 y.o.   MRN: [id number]  This visit was conducted in person.  BP 130/66   Pulse 73   Temp (!) 97.2 F (36.2 C) (Temporal)   Ht 5' 7.5" (1.715 m)   Wt 136 lb (61.7 kg)   SpO2 98%   BMI 20.99 kg/m    CC: several concerns Subjective:   HPI: Sierra Kaiser is a 89 y.o. female presenting on 08/14/2022 for Numbness (C/o B finger numbness and peeling. Started about 2 wks ago. Pt accompanied by sister, [name]. ), Skin Lesion (Wants lesion on chest checked. States it's lighter colored than before. ), and Results (Wants to discuss stool kit results and h/o Celiac dz. Also, c/o head feeling heavy. Pt has stopped iron supplement as instructed. But has also stopped Xarelto before consulting cards. )   2 wk h/o bilateral finger numbness and peeling with tips of fingers turning red - toes not affected. No other rash, new joint pains, other skin changes. No fevers/chills, oral lesions.  She's been having some R shoulder pain while building a path in her yard.  Nocturia has improved with oxybutynin 5mg  nightly.   Notes ongoing dizziness, unsteadiness, imbalance associated with AMS. This is chronic in nature. No vertigo. No anemia. Mild iron deficiency with ferritin down to 8.3.  Completed iron infusion Feraheme x2 (latest 07/25/2022).  Currently off oral iron as she developed dark stools, iFOB returned positive.  She had reassuring colonoscopy and endoscopy 2019 [facility]).   Touched base with cardiology Dr Mariah Milling who recommended continue xarelto 15mg  daily due to risk of stroke likely outweighs risk of bleed (see recent cardiology phone note).   Last B12 shot was 07/18/2022.      Relevant past medical, surgical, family and social history reviewed and updated as indicated. Interim medical history since our last visit reviewed. Allergies and medications reviewed and updated. Outpatient Medications  Prior to Visit  Medication Sig Dispense Refill  . Cholecalciferol (VITAMIN D3) 25 MCG (1000 UT) CAPS Take 2 capsules (2,000 Units total) by mouth daily.    . cyanocobalamin (,VITAMIN B-12,) 1000 MCG/ML injection Inject 1 mL (1,000 mcg total) into the muscle every 30 (thirty) days.    Marland Kitchen ezetimibe (ZETIA) 10 MG tablet Take 1 tablet (10 mg total) by mouth daily. 90 tablet 4  . fluticasone (FLONASE) 50 MCG/ACT nasal spray SPRAY 2 SPRAYS INTO EACH NOSTRIL EVERY DAY 48 mL 1  . furosemide (LASIX) 20 MG tablet TAKE 1 TABLET (20 MG TOTAL) BY MOUTH AS NEEDED FOR FLUID OR EDEMA. 30 tablet 3  . oxybutynin (DITROPAN) 5 MG tablet TAKE 1 TABLET (5 MG TOTAL) BY MOUTH DAILY AS NEEDED (INCONTINENCE). 30 tablet 3  . Polyethyl Glycol-Propyl Glycol (SYSTANE OP) Place 1 drop into both eyes daily as needed (dry eyes).    . rosuvastatin (CRESTOR) 20 MG tablet Take 1 tablet (20 mg total) by mouth every other day. 45 tablet 4  . Iron, Ferrous Sulfate, 325 (65 Fe) MG TABS Take 325 mg by mouth every Monday, Wednesday, and Friday. (Patient not taking: Reported on 08/14/2022)    . Rivaroxaban (XARELTO) 15 MG TABS tablet TAKE 1 TABLET BY MOUTH DAILY WITH SUPPER (Patient not taking: Reported on 08/14/2022) 90 tablet 0   No facility-administered medications prior to visit.     Per HPI unless specifically indicated in ROS section below Review of Systems  Objective:  BP  130/66   Pulse 73   Temp (!) 97.2 F (36.2 C) (Temporal)   Ht 5' 7.5" (1.715 m)   Wt 136 lb (61.7 kg)   SpO2 98%   BMI 20.99 kg/m   Wt Readings from Last 3 Encounters:  08/14/22 136 lb (61.7 kg)  07/25/22 138 lb (62.6 kg)  07/18/22 139 lb 6.4 oz (63.2 kg)      Physical Exam Vitals and nursing note reviewed.  Constitutional:      Appearance: Normal appearance. She is not ill-appearing.  HENT:     Head: Normocephalic and atraumatic.     Mouth/Throat:     Mouth: Mucous membranes are moist.     Pharynx: Oropharynx is clear. No oropharyngeal exudate  or posterior oropharyngeal erythema.  Cardiovascular:     Rate and Rhythm: Normal rate and regular rhythm.     Pulses: Normal pulses.     Heart sounds: Normal heart sounds. No murmur heard. Pulmonary:     Effort: Pulmonary effort is normal. No respiratory distress.     Breath sounds: Normal breath sounds. No wheezing, rhonchi or rales.  Musculoskeletal:     Right lower leg: No edema.     Left lower leg: No edema.  Skin:    General: Skin is warm and dry.     Findings: Rash present.     Comments:  Peeling rash to bilateral hands predominantly digits involved  Peeling to sole of left foot, right foot spared  Neurological:     Mental Status: She is alert.     Comments:  CN 2-12 intact FTN intact EOMI Unsteadiness when first standing from seated position  Psychiatric:        Mood and Affect: Mood normal.        Behavior: Behavior normal.       Results for orders placed or performed in visit on 08/09/22  Fecal occult blood, imunochemical   Specimen: Stool  Result Value Ref Range   Fecal Occult Bld Positive (A) Negative   Lab Results  Component Value Date   VITAMINB12 717 07/10/2022    Lab Results  Component Value Date   VD25OH 29.93 (L) 04/10/2022    Lab Results  Component Value Date   IRON 68 07/10/2022   TIBC 407.4 07/10/2022   FERRITIN 8.3 (L) 07/10/2022   Lab Results  Component Value Date   WBC 5.6 07/10/2022   HGB 13.2 07/10/2022   HCT 39.2 07/10/2022   MCV 90.8 07/10/2022   PLT 188.0 07/10/2022   Assessment & Plan:   Problem List Items Addressed This Visit     Iron deficiency - Primary   Relevant Orders   Ferritin   IBC panel   Chronic fatigue   Relevant Orders   Cortisol-am, blood     No orders of the defined types were placed in this encounter.   Orders Placed This Encounter  Procedures  . Cortisol-am, blood    Standing Status:   Future    Standing Expiration Date:   08/14/2023  . Ferritin    Standing Status:   Future    Standing  Expiration Date:   08/14/2023  . IBC panel    Standing Status:   Future    Standing Expiration Date:   08/14/2023    Patient Instructions  For skin peeling of hands, I will refer you back to Dr Allena Katz (Rheumatology).  For blood in stool, I will refer you back to Dr Horace Porteous office (GI).  You may call  to schedule appointments for both - at [facility].  Return later this week for b12 shot, add lab visit at the same time to recheck iron levels.   Follow up plan: Return if symptoms worsen or fail to improve.  Eustaquio Boyden, MD

## 2022-08-15 ENCOUNTER — Encounter: Payer: Self-pay | Admitting: Family Medicine

## 2022-08-15 DIAGNOSIS — R200 Anesthesia of skin: Secondary | ICD-10-CM | POA: Diagnosis not present

## 2022-08-15 DIAGNOSIS — E611 Iron deficiency: Secondary | ICD-10-CM | POA: Diagnosis not present

## 2022-08-15 DIAGNOSIS — R5382 Chronic fatigue, unspecified: Secondary | ICD-10-CM | POA: Diagnosis not present

## 2022-08-15 DIAGNOSIS — R195 Other fecal abnormalities: Secondary | ICD-10-CM | POA: Insufficient documentation

## 2022-08-15 DIAGNOSIS — K5909 Other constipation: Secondary | ICD-10-CM | POA: Diagnosis not present

## 2022-08-15 DIAGNOSIS — Z86010 Personal history of colonic polyps: Secondary | ICD-10-CM | POA: Diagnosis not present

## 2022-08-15 DIAGNOSIS — Z800 Family history of malignant neoplasm of digestive organs: Secondary | ICD-10-CM | POA: Diagnosis not present

## 2022-08-15 NOTE — Assessment & Plan Note (Addendum)
H/o this, unclear if true diagnosis.  Referred to audiology for R hearing loss earlier this year, unclear if she went.

## 2022-08-15 NOTE — Assessment & Plan Note (Signed)
Recent positive iFOB, not anemic, however low ferritin levels with chronic fatigue s/p recent iron infusion.  Reassuring colonoscopy/endoscopy 2019 [facility]) Discussed options - will refer back to [facility] for further evaluation per pt request.

## 2022-08-15 NOTE — Assessment & Plan Note (Addendum)
Continue monthly b12 shots through our office

## 2022-08-15 NOTE — Assessment & Plan Note (Signed)
No longer seeing neurology, off aricept. Decided not to return at this time.

## 2022-08-15 NOTE — Assessment & Plan Note (Addendum)
Update labs for reversible cause of fatigue when returns later this week. Iron infusion didn't provide significant benefit in symptoms of chronic fatigue and dyspnea.  Given chronic fatigue, check am coritsol to eval adrenal insuff.

## 2022-08-15 NOTE — Assessment & Plan Note (Signed)
Received Feraheme infusion x2 07/2022.  Will return later this week for rpt iron panel/CBC.

## 2022-08-15 NOTE — Assessment & Plan Note (Signed)
Chronic dizziness, described as unsteadiness/imbalance worse when first standing. Non-focal neurological exam.  No vertigo. No anemia.  Orthostatics previously checked and normal.

## 2022-08-15 NOTE — Assessment & Plan Note (Signed)
Known h/o this. Consider amlodipine.

## 2022-08-15 NOTE — Assessment & Plan Note (Signed)
Cardiology recommended continuing xarelto 15mg  daily as risk of stroke outweighs risk of bleed.

## 2022-08-15 NOTE — Assessment & Plan Note (Signed)
H/o this with Raynaud's disease, now with new peeling skin to digits of bilateral hands, left foot also affected.  Suggested return to rheumatology for further evaluation.

## 2022-08-15 NOTE — Assessment & Plan Note (Addendum)
Doubt reaction to Feraheme.  Unclear cause ?autoimmune.  Known h/o raynaud's.  Discussed possible rheum eval.

## 2022-08-17 ENCOUNTER — Ambulatory Visit (INDEPENDENT_AMBULATORY_CARE_PROVIDER_SITE_OTHER): Payer: Medicare Other

## 2022-08-17 ENCOUNTER — Encounter: Payer: Self-pay | Admitting: *Deleted

## 2022-08-17 ENCOUNTER — Other Ambulatory Visit (INDEPENDENT_AMBULATORY_CARE_PROVIDER_SITE_OTHER): Payer: Medicare Other

## 2022-08-17 DIAGNOSIS — E611 Iron deficiency: Secondary | ICD-10-CM

## 2022-08-17 DIAGNOSIS — R195 Other fecal abnormalities: Secondary | ICD-10-CM

## 2022-08-17 DIAGNOSIS — R5382 Chronic fatigue, unspecified: Secondary | ICD-10-CM

## 2022-08-17 DIAGNOSIS — E538 Deficiency of other specified B group vitamins: Secondary | ICD-10-CM

## 2022-08-17 LAB — CBC WITH DIFFERENTIAL/PLATELET
Basophils Absolute: 0.1 10*3/uL (ref 0.0–0.1)
Basophils Relative: 1.2 % (ref 0.0–3.0)
Eosinophils Absolute: 0.2 10*3/uL (ref 0.0–0.7)
Eosinophils Relative: 3.1 % (ref 0.0–5.0)
HCT: 39.6 % (ref 36.0–46.0)
Hemoglobin: 13.2 g/dL (ref 12.0–15.0)
Lymphocytes Relative: 33.3 % (ref 12.0–46.0)
Lymphs Abs: 1.7 10*3/uL (ref 0.7–4.0)
MCHC: 33.3 g/dL (ref 30.0–36.0)
MCV: 93.7 fl (ref 78.0–100.0)
Monocytes Absolute: 0.5 10*3/uL (ref 0.1–1.0)
Monocytes Relative: 8.9 % (ref 3.0–12.0)
Neutro Abs: 2.8 10*3/uL (ref 1.4–7.7)
Neutrophils Relative %: 53.5 % (ref 43.0–77.0)
Platelets: 157 10*3/uL (ref 150.0–400.0)
RBC: 4.23 Mil/uL (ref 3.87–5.11)
RDW: 16.7 % — ABNORMAL HIGH (ref 11.5–15.5)
WBC: 5.2 10*3/uL (ref 4.0–10.5)

## 2022-08-17 LAB — IBC PANEL
Iron: 136 ug/dL (ref 42–145)
Saturation Ratios: 52.5 % — ABNORMAL HIGH (ref 20.0–50.0)
TIBC: 259 ug/dL (ref 250.0–450.0)
Transferrin: 185 mg/dL — ABNORMAL LOW (ref 212.0–360.0)

## 2022-08-17 LAB — FERRITIN: Ferritin: 294 ng/mL — ABNORMAL HIGH (ref 10.0–291.0)

## 2022-08-17 MED ORDER — CYANOCOBALAMIN 1000 MCG/ML IJ SOLN
1000.0000 ug | Freq: Once | INTRAMUSCULAR | Status: AC
Start: 2022-08-17 — End: 2022-08-17
  Administered 2022-08-17: 1000 ug via INTRAMUSCULAR

## 2022-08-17 NOTE — Progress Notes (Signed)
Per orders of Dr. Crawford Givens, injection of vitamin b 12 given by Lewanda Rife in left deltoid. Patient tolerated injection well. Patient will make appointment for 1 month.

## 2022-08-18 LAB — CORTISOL-AM, BLOOD: Cortisol - AM: 16.3 ug/dL

## 2022-08-28 ENCOUNTER — Telehealth: Payer: Self-pay

## 2022-08-28 ENCOUNTER — Other Ambulatory Visit: Payer: Self-pay | Admitting: Family Medicine

## 2022-08-28 NOTE — Telephone Encounter (Signed)
Noted. I have updated your chart to reflect this completed appointment.   Have a great day! -Nadara Eaton, [facility] Referral Coordinator

## 2022-08-28 NOTE — Telephone Encounter (Signed)
Attempted to contact pt. No answer. Vm box not set up. Need to relay lab results and Dr. Timoteo Expose message. (See Labs, Result Notes- 08/17/22.)  Labs/Dr. Timoteo Expose msg: Your labwork returned showing no anemia. Your iron levels are now high - after iron infusion. Ok to stay off oral iron at this time. Your cortisol stress hormone level are normal

## 2022-08-29 NOTE — Telephone Encounter (Signed)
Spoke with pt relaying results and Dr. G's message.  Pt verbalizes understanding.  

## 2022-09-04 NOTE — Progress Notes (Signed)
Remote pacemaker transmission.   

## 2022-09-20 ENCOUNTER — Ambulatory Visit (INDEPENDENT_AMBULATORY_CARE_PROVIDER_SITE_OTHER): Payer: Medicare Other

## 2022-09-20 DIAGNOSIS — E538 Deficiency of other specified B group vitamins: Secondary | ICD-10-CM | POA: Diagnosis not present

## 2022-09-20 MED ORDER — CYANOCOBALAMIN 1000 MCG/ML IJ SOLN
1000.0000 ug | Freq: Once | INTRAMUSCULAR | Status: AC
Start: 2022-09-20 — End: 2022-09-20
  Administered 2022-09-20: 1000 ug via INTRAMUSCULAR

## 2022-09-20 NOTE — Progress Notes (Signed)
Patient presented for B 12 injection given by Jessica Isley, CMA to right deltoid, patient voiced no concerns nor showed any signs of distress during injection.  

## 2022-10-03 ENCOUNTER — Other Ambulatory Visit: Payer: Self-pay | Admitting: Cardiovascular Disease

## 2022-10-03 NOTE — Telephone Encounter (Signed)
Refill request

## 2022-10-03 NOTE — Telephone Encounter (Signed)
Prescription refill request for Xarelto received.  Indication: AF Last office visit: 06/20/22  Peggyann Juba PA-C Weight: 62.3kg Age: 89 Scr: 1.14 on 07/10/22  Epic CrCl: 34.19  Based on above findings Xarelto 15mg  daily is the appropriate dose.  Refill approved.

## 2022-10-04 ENCOUNTER — Encounter (INDEPENDENT_AMBULATORY_CARE_PROVIDER_SITE_OTHER): Payer: Self-pay

## 2022-10-24 ENCOUNTER — Ambulatory Visit: Payer: Medicare Other

## 2022-10-27 ENCOUNTER — Encounter: Payer: Self-pay | Admitting: Family Medicine

## 2022-10-27 NOTE — Telephone Encounter (Signed)
ERROR

## 2022-10-30 ENCOUNTER — Other Ambulatory Visit: Payer: Self-pay | Admitting: Family Medicine

## 2022-10-30 DIAGNOSIS — I10 Essential (primary) hypertension: Secondary | ICD-10-CM | POA: Diagnosis not present

## 2022-10-30 DIAGNOSIS — N390 Urinary tract infection, site not specified: Secondary | ICD-10-CM | POA: Diagnosis not present

## 2022-10-30 DIAGNOSIS — N1832 Chronic kidney disease, stage 3b: Secondary | ICD-10-CM | POA: Diagnosis not present

## 2022-10-30 DIAGNOSIS — Z03818 Encounter for observation for suspected exposure to other biological agents ruled out: Secondary | ICD-10-CM | POA: Diagnosis not present

## 2022-10-31 ENCOUNTER — Ambulatory Visit (INDEPENDENT_AMBULATORY_CARE_PROVIDER_SITE_OTHER): Payer: Medicare Other

## 2022-10-31 DIAGNOSIS — E538 Deficiency of other specified B group vitamins: Secondary | ICD-10-CM | POA: Diagnosis not present

## 2022-10-31 MED ORDER — CYANOCOBALAMIN 1000 MCG/ML IJ SOLN
1000.0000 ug | Freq: Once | INTRAMUSCULAR | Status: AC
Start: 2022-10-31 — End: 2022-10-31
  Administered 2022-10-31: 1000 ug via INTRAMUSCULAR

## 2022-10-31 NOTE — Progress Notes (Signed)
Per orders of Dr. Javier Gutierrez, injection of vitamin b 12 given by Rena Isley in left deltoid. Patient tolerated injection well. Patient will make appointment for 1 month.   

## 2022-11-08 ENCOUNTER — Ambulatory Visit (INDEPENDENT_AMBULATORY_CARE_PROVIDER_SITE_OTHER): Payer: Medicare Other

## 2022-11-08 DIAGNOSIS — I428 Other cardiomyopathies: Secondary | ICD-10-CM | POA: Diagnosis not present

## 2022-11-09 LAB — CUP PACEART REMOTE DEVICE CHECK
Battery Remaining Longevity: 44 mo
Battery Remaining Percentage: 32 %
Battery Voltage: 2.93 V
Brady Statistic RV Percent Paced: 99 %
Date Time Interrogation Session: 20240905001623
Implantable Lead Connection Status: 753985
Implantable Lead Implant Date: 20160217
Implantable Lead Location: 753860
Implantable Lead Model: 1948
Implantable Pulse Generator Implant Date: 20160217
Lead Channel Impedance Value: 590 Ohm
Lead Channel Pacing Threshold Amplitude: 0.625 V
Lead Channel Pacing Threshold Pulse Width: 0.4 ms
Lead Channel Sensing Intrinsic Amplitude: 12 mV
Lead Channel Setting Pacing Amplitude: 0.875
Lead Channel Setting Pacing Pulse Width: 0.4 ms
Lead Channel Setting Sensing Sensitivity: 2.5 mV
Pulse Gen Model: 2240
Pulse Gen Serial Number: 3050195

## 2022-11-21 NOTE — Progress Notes (Signed)
Remote pacemaker transmission.   

## 2022-11-30 ENCOUNTER — Ambulatory Visit (INDEPENDENT_AMBULATORY_CARE_PROVIDER_SITE_OTHER): Payer: Medicare Other

## 2022-11-30 DIAGNOSIS — E538 Deficiency of other specified B group vitamins: Secondary | ICD-10-CM

## 2022-11-30 MED ORDER — CYANOCOBALAMIN 1000 MCG/ML IJ SOLN
1000.0000 ug | Freq: Once | INTRAMUSCULAR | Status: AC
Start: 2022-11-30 — End: 2022-11-30
  Administered 2022-11-30: 1000 ug via INTRAMUSCULAR

## 2022-11-30 NOTE — Progress Notes (Signed)
Per orders of Dr. Rosalva Ferron is out of office and Dr Para March who is in office injection of vitamiin b 12 given by Lewanda Rife in right deltoid. Patient tolerated injection well. Patient will make appointment for 1 month.

## 2022-12-21 ENCOUNTER — Other Ambulatory Visit: Payer: Self-pay

## 2022-12-21 ENCOUNTER — Telehealth: Payer: Self-pay | Admitting: Cardiovascular Disease

## 2022-12-21 MED ORDER — RIVAROXABAN 15 MG PO TABS: 15.0000 mg | ORAL_TABLET | Freq: Every day | ORAL | 1 refills | Status: DC

## 2022-12-21 MED ORDER — RIVAROXABAN 15 MG PO TABS: 15.0000 mg | ORAL_TABLET | Freq: Every day | ORAL | 0 refills | Status: DC

## 2022-12-21 NOTE — Telephone Encounter (Signed)
Called patient, advised that we had samples avaliable for pick up.   Patient verbalized understanding.   Medication given: Xarelto 15 mg Lot #:[id number] Expiration: 04/2023 Qty: 14 tablets

## 2022-12-21 NOTE — Telephone Encounter (Signed)
Pt c/o medication issue:  1. Name of Medication:   Rivaroxaban (XARELTO) 15 MG TABS tablet   2. How are you currently taking this medication (dosage and times per day)?   As prescribed  3. Are you having a reaction (difficulty breathing--STAT)?   No  4. What is your medication issue?   Patient stated she is in the donut hole and she is no longer able to afford this medication and wants to get a cheaper medication.

## 2022-12-21 NOTE — Telephone Encounter (Signed)
Called patient, advised the her Xarelto was $300 for a 3 month supply, she will run out by the end of October, and is not able to afford to do this again. I advised we could check on samples to help provide, but we could try to send in a 30 day supply as well to see if the price was different for this.   Patient verbalized understanding.

## 2023-01-02 ENCOUNTER — Ambulatory Visit: Payer: Medicare Other

## 2023-01-16 ENCOUNTER — Ambulatory Visit (INDEPENDENT_AMBULATORY_CARE_PROVIDER_SITE_OTHER): Payer: Medicare Other

## 2023-01-16 DIAGNOSIS — E538 Deficiency of other specified B group vitamins: Secondary | ICD-10-CM | POA: Diagnosis not present

## 2023-01-16 MED ORDER — CYANOCOBALAMIN 1000 MCG/ML IJ SOLN
1000.0000 ug | Freq: Once | INTRAMUSCULAR | Status: AC
Start: 2023-01-16 — End: 2023-01-16
  Administered 2023-01-16: 1000 ug via INTRAMUSCULAR

## 2023-01-16 NOTE — Progress Notes (Signed)
Per orders of Dr. Eustaquio Boyden, injection of vitamin b 12 given by Lewanda Rife in right deltoid. Patient tolerated injection well. Patient will make appointment for 1 month.(Pt got flu vaccine in lt deltoid 01/15/23 at pharmacy)

## 2023-02-07 ENCOUNTER — Ambulatory Visit (INDEPENDENT_AMBULATORY_CARE_PROVIDER_SITE_OTHER): Payer: Medicare Other

## 2023-02-07 ENCOUNTER — Other Ambulatory Visit: Payer: Self-pay

## 2023-02-07 DIAGNOSIS — I428 Other cardiomyopathies: Secondary | ICD-10-CM

## 2023-02-07 LAB — CUP PACEART REMOTE DEVICE CHECK
Battery Remaining Longevity: 40 mo
Battery Remaining Percentage: 30 %
Battery Voltage: 2.92 V
Brady Statistic RV Percent Paced: 99 %
Date Time Interrogation Session: 20241204020014
Implantable Lead Connection Status: 753985
Implantable Lead Implant Date: 20160217
Implantable Lead Location: 753860
Implantable Lead Model: 1948
Implantable Pulse Generator Implant Date: 20160217
Lead Channel Impedance Value: 560 Ohm
Lead Channel Pacing Threshold Amplitude: 0.875 V
Lead Channel Pacing Threshold Pulse Width: 0.4 ms
Lead Channel Sensing Intrinsic Amplitude: 8.4 mV
Lead Channel Setting Pacing Amplitude: 1.125
Lead Channel Setting Pacing Pulse Width: 0.4 ms
Lead Channel Setting Sensing Sensitivity: 2.5 mV
Pulse Gen Model: 2240
Pulse Gen Serial Number: 3050195

## 2023-02-15 ENCOUNTER — Ambulatory Visit: Payer: Medicare Other

## 2023-02-15 DIAGNOSIS — E538 Deficiency of other specified B group vitamins: Secondary | ICD-10-CM | POA: Diagnosis not present

## 2023-02-15 MED ORDER — CYANOCOBALAMIN 1000 MCG/ML IJ SOLN
1000.0000 ug | Freq: Once | INTRAMUSCULAR | Status: AC
  Administered 2023-02-15: 1000 ug via INTRAMUSCULAR

## 2023-02-15 NOTE — Progress Notes (Signed)
Per orders of Dr. Crawford Givens, injection of b12 given by Lonia Blood in left deltoid. Patient tolerated injection well. Patient will make appointment for 1 month.

## 2023-02-20 DIAGNOSIS — Z9181 History of falling: Secondary | ICD-10-CM | POA: Diagnosis not present

## 2023-03-19 ENCOUNTER — Ambulatory Visit (INDEPENDENT_AMBULATORY_CARE_PROVIDER_SITE_OTHER): Payer: HMO

## 2023-03-19 VITALS — BP 122/64 | Ht 67.0 in | Wt 134.0 lb

## 2023-03-19 DIAGNOSIS — Z0000 Encounter for general adult medical examination without abnormal findings: Secondary | ICD-10-CM | POA: Diagnosis not present

## 2023-03-19 NOTE — Progress Notes (Signed)
 Subjective:   Sierra Kaiser is a 89 y.o. female who presents for Medicare Annual (Subsequent) preventive examination.  Visit Complete: Virtual I connected with  Sierra Kaiser on 03/19/23 by a audio enabled telemedicine application and verified that I am speaking with the correct person using two identifiers.  Patient Location: Home  Provider Location: Office/Clinic  I discussed the limitations of evaluation and management by telemedicine. The patient expressed understanding and agreed to proceed.  Vital Signs: Because this visit was a virtual/telehealth visit, some criteria may be missing or patient reported. Any vitals not documented were not able to be obtained and vitals that have been documented are patient reported.  Patient Medicare AWV questionnaire was completed by the patient on (not done) ; I have confirmed that all information answered by patient is correct and no changes since this date.  Cardiac Risk Factors include: advanced age (>61men, >81 women);dyslipidemia    Objective:    Today's Vitals   03/19/23 1344  BP: 122/64  Weight: 134 lb (60.8 kg)  Height: 5' 7 (1.702 m)  PainSc: 0-No pain   Body mass index is 20.99 kg/m.     03/19/2023    2:08 PM 05/03/2021   11:37 AM 07/05/2020   12:22 PM 12/11/2019    1:45 PM 10/15/2019   12:14 PM 08/29/2017    7:23 AM 01/17/2017    2:59 PM  Advanced Directives  Does Patient Have a Medical Advance Directive? Yes Yes Yes No Yes Yes Yes  Type of Estate Agent of [place];Living will Healthcare Power of [place];Living will Living will  Healthcare Power of [place];Living will Healthcare Power of [place];Living will Healthcare Power of [place];Living will  Does patient want to make changes to medical advance directive?  Yes (MAU/Ambulatory/Procedural Areas - Information given)       Copy of Healthcare Power of Attorney in Chart? No - copy requested    No - copy requested  No - copy requested  Would  patient like information on creating a medical advance directive?   No - Patient declined        Current Medications (verified) Outpatient Encounter Medications as of 03/19/2023  Medication Sig   Cholecalciferol  (VITAMIN D3) 25 MCG (1000 UT) CAPS Take 2 capsules (2,000 Units total) by mouth daily.   cyanocobalamin  (,VITAMIN B-12,) 1000 MCG/ML injection Inject 1 mL (1,000 mcg total) into the muscle every 30 (thirty) days.   ezetimibe  (ZETIA ) 10 MG tablet Take 1 tablet (10 mg total) by mouth daily.   fluticasone  (FLONASE ) 50 MCG/ACT nasal spray SPRAY 2 SPRAYS INTO EACH NOSTRIL EVERY DAY   furosemide  (LASIX ) 20 MG tablet TAKE 1 TABLET (20 MG TOTAL) BY MOUTH AS NEEDED FOR FLUID OR EDEMA.   oxybutynin  (DITROPAN ) 5 MG tablet TAKE 1 TABLET (5 MG TOTAL) BY MOUTH DAILY AS NEEDED (INCONTINENCE).   Polyethyl Glycol-Propyl Glycol (SYSTANE OP) Place 1 drop into both eyes daily as needed (dry eyes).   Rivaroxaban  (XARELTO ) 15 MG TABS tablet Take 1 tablet (15 mg total) by mouth daily with supper.   Rivaroxaban  (XARELTO ) 15 MG TABS tablet Take 1 tablet (15 mg total) by mouth daily with supper.   rosuvastatin  (CRESTOR ) 20 MG tablet Take 1 tablet (20 mg total) by mouth every other day.   No facility-administered encounter medications on file as of 03/19/2023.    Allergies (verified) Gadolinium derivatives, Contrast media [iodinated contrast media], Gabapentin, Metronidazole, Other, Sertraline , and Diltiazem  hcl   History: Past Medical History:  Diagnosis  Date   Anxiety    CAD (coronary artery disease)    CHF (congestive heart failure) (HCC)    Esophageal reflux    GI bleeding 2012   during EGD necessitating open surgery   Hiatal hernia    History of blood transfusion    History of diverticulitis    History of UTI    HTN (hypertension)    Hyperlipidemia    Meniere's disease 1970s   Mitral regurgitation    Osteopenia 12/11/2015   T score -1.2 femur, -2.0 spine (12/2015)   Permanent atrial  fibrillation (HCC)    a. permanent b. s/p PVI RFA at [facility] 10/14 c. failed amio (neuro toxicity) and Tikosyn d. 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    PUD (peptic ulcer disease)    Raynaud's disease    Scalp laceration, subsequent encounter 07/10/2022   SNHL (sensorineural hearing loss)    right ear   Syncopal episodes    Tinnitus    Tinnitus    Ulcerative colitis (HCC)    per prior records   Urinary incontinence, urge    Past Surgical History:  Procedure Laterality Date   APPENDECTOMY  1973   AV NODE ABLATION N/A 05/06/2014   Procedure: AV NODE ABLATION;  Surgeon: Elspeth JAYSON Sage, MD;  Location: [facility] CATH LAB;  Service: Cardiovascular;  Laterality: N/A;   CARDIAC CATHETERIZATION  2014   [facility]   CARDIAC ELECTROPHYSIOLOGY STUDY AND ABLATION     CHOLECYSTECTOMY  2010   COLONOSCOPY  2010   polyps   COLONOSCOPY WITH PROPOFOL  N/A 08/29/2017   TAs, no f/u recommended Emil, Ladell POUR, MD)   ESOPHAGOGASTRODUODENOSCOPY (EGD) WITH PROPOFOL  N/A 08/29/2017   benign biopsies Emil, Ladell POUR, MD)   HEMORRHOID SURGERY     LAPAROTOMY  2012   EGD biopsy led to bleeding - needed laparotomy to stop bleed (Dr Trudy at [facility])   PERMANENT PACEMAKER INSERTION N/A 04/22/2014   STJ single chamber pacemaker implanted by Dr Sage   TRANSESOPHAGEAL ECHOCARDIOGRAM WITH CARDIOVERSION  2014   [facility]   UPPER GI ENDOSCOPY  2012   with polypectomy   VAGINAL HYSTERECTOMY  1973   elective; ovaries remained   Family History  Problem Relation Age of Onset   Arrhythmia Mother    Hypertension Mother    Diabetes Mother    Heart failure Brother    Heart failure Brother    Cancer Brother        colon (with colostomy)   Breast cancer Sister 76   Stroke Sister    Alcohol abuse Brother    Stroke Brother    Diabetes Son    Social History   Socioeconomic History   Marital status: Married    Spouse name: Not on file   Number of children: Not on file    Years of education: Not on file   Highest education level: Not on file  Occupational History   Not on file  Tobacco Use   Smoking status: Never   Smokeless tobacco: Never  Vaping Use   Vaping status: Never Used  Substance and Sexual Activity   Alcohol use: Yes    Alcohol/week: 1.0 standard drink of alcohol    Types: 1 Glasses of wine per week    Comment: 1-2 glasses wine a month   Drug use: Never   Sexual activity: Never  Other Topics Concern   Not on file  Social History Narrative   Lives with husband.  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)   Edu: [employer]   Social Drivers of Corporate Investment Banker Strain: Low Risk  (03/19/2023)   Overall Financial Resource Strain (CARDIA)    Difficulty of Paying Living Expenses: Not hard at all  Food Insecurity: No Food Insecurity (03/19/2023)   Hunger Vital Sign    Worried About Running Out of Food in the Last Year: Never true    Ran Out of Food in the Last Year: Never true  Transportation Needs: No Transportation Needs (03/19/2023)   PRAPARE - Administrator, Civil Service (Medical): No    Lack of Transportation (Non-Medical): No  Physical Activity: Inactive (03/19/2023)   Exercise Vital Sign    Days of Exercise per Week: 0 days    Minutes of Exercise per Session: 0 min  Stress: No Stress Concern Present (03/19/2023)   [employer] of Occupational Health - Occupational Stress Questionnaire    Feeling of Stress : Not at all  Social Connections: Moderately Integrated (03/19/2023)   Social Connection and Isolation Panel [NHANES]    Frequency of Communication with Friends and Family: Three times a week    Frequency of Social Gatherings with Friends and Family: Twice a week    Attends Religious Services: More than 4 times per year    Active Member of [employer] or Organizations: No    Attends Engineer, Structural: Never    Marital Status: Married    Tobacco  Counseling Counseling given: Not Answered   Clinical Intake:  Pre-visit preparation completed: No  Pain : No/denies pain Pain Score: 0-No pain    BMI - recorded: 20.99 Nutritional Status: BMI of 19-24  Normal Nutritional Risks: None Diabetes: No  How often do you need to have someone help you when you read instructions, pamphlets, or other written materials from your doctor or pharmacy?: 1 - Never  Interpreter Needed?: No  Comments: lives with husband Information entered by :: B.Kare Dado,LPN   Activities of Daily Living    03/19/2023    2:10 PM  In your present state of health, do you have any difficulty performing the following activities:  Hearing? 1  Vision? 1  Difficulty concentrating or making decisions? 1  Comment concentrating  Walking or climbing stairs? 1  Dressing or bathing? 1  Doing errands, shopping? 1  Preparing Food and eating ? N  Using the Toilet? N  In the past six months, have you accidently leaked urine? Y  Do you have problems with loss of bowel control? N  Managing your Medications? N  Managing your Finances? N  Housekeeping or managing your Housekeeping? Y  Comment husband does    Patient Care Team: Rilla Baller, MD as PCP - General (Family Medicine) Fernande Elspeth BROCKS, MD as PCP - Cardiology (Cardiology) Perla Evalene PARAS, MD as Consulting Physician (Cardiology)  Indicate any recent Medical Services you may have received from other than [facility] providers in the past year (date may be approximate).     Assessment:   This is a routine wellness examination for Levaeh.  Hearing/Vision screen Hearing Screening - Comments:: Pt says she is deaf in left ear;hears well in rt ear Vision Screening - Comments:: Pt says vision is wanning:she no longer drives [facility]   Goals Addressed             This Visit's Progress    other   On track    After vacation,  I plan to see a vein specialist to discuss leaky valve and shortness of  breath.     Patient Stated   On track    10/15/2019, I will maintain and continue medications as prescribed.      Patient Stated   On track    Would like to maintain current routine      COMPLETED: water intake   Not on track    Starting 01/17/2017, I will attempt to drink at least 3-4 glasses of water daily.        Depression Screen    03/19/2023    2:04 PM 08/14/2022   10:21 AM 07/12/2022    4:33 PM 07/10/2022   12:10 PM 04/10/2022   11:50 AM 05/03/2021   11:41 AM 11/02/2020   10:11 AM  PHQ 2/9 Scores  PHQ - 2 Score 0 0  0 2 0 0  PHQ- 9 Score    3 10  10   Exception Documentation   Patient refusal        Fall Risk    03/19/2023    1:53 PM 07/10/2022   12:10 PM 04/10/2022   11:49 AM 05/03/2021   11:40 AM 11/02/2020    9:17 AM  Fall Risk   Falls in the past year? 0 1 1 0 0  Number falls in past yr: 0 1 1 0   Injury with Fall? 0 1 0 0   Risk for fall due to : No Fall Risks   No Fall Risks   Follow up Falls prevention discussed;Education provided   Falls prevention discussed     MEDICARE RISK AT HOME: Medicare Risk at Home Any stairs in or around the home?: Yes If so, are there any without handrails?: Yes Home free of loose throw rugs in walkways, pet beds, electrical cords, etc?: Yes Adequate lighting in your home to reduce risk of falls?: Yes Life alert?: Yes Use of a cane, walker or w/c?: No Grab bars in the bathroom?: Yes Shower chair or bench in shower?: Yes Elevated toilet seat or a handicapped toilet?: No  TIMED UP AND GO:  Was the test performed?  Yes  Length of time to ambulate 10 feet: 12 sec Gait steady and fast without use of assistive device    Cognitive Function:    10/15/2019   12:17 PM 01/17/2017    2:50 PM 10/07/2015   10:30 AM  MMSE - Mini Mental State Exam  Orientation to time 5 5 5   Orientation to Place 5 5 5   Registration 3 3 3   Attention/ Calculation 5 0 0  Recall 3 3 3   Language- name 2 objects  0 0  Language- repeat 1 1 1   Language- follow  3 step command  3 3  Language- read & follow direction  0 0  Write a sentence  0 0  Copy design  0 0  Total score  20 20        03/19/2023    2:13 PM  6CIT Screen  What Year? 0 points  What month? 0 points  What time? 0 points  Count back from 20 0 points  Months in reverse 0 points  Repeat phrase 0 points  Total Score 0 points    Immunizations Immunization History  Administered Date(s) Administered   Fluad Quad(high Dose 65+) 12/24/2018, 12/02/2019, 02/06/2022   Influenza, High Dose Seasonal PF 12/05/2016, 12/18/2020, 01/15/2023   Influenza,inj,Quad PF,6+ Mos 11/15/2015, 11/27/2017   Influenza-Unspecified 12/28/2014   PFIZER(Purple Top)SARS-COV-2  Vaccination 03/11/2019, 04/01/2019, 12/19/2019, 03/10/2020   Pneumococcal Conjugate-13 04/03/2014   Pneumococcal Polysaccharide-23 03/16/2010   Zoster Recombinant(Shingrix) 12/23/2020    TDAP status: Up to date  Flu Vaccine status: Up to date  Pneumococcal vaccine status: Up to date  Covid-19 vaccine status: Completed vaccines  Qualifies for Shingles Vaccine? Yes   Zostavax completed Yes #1 Shingrix Completed?: No.    Education has been provided regarding the importance of this vaccine. Patient has been advised to call insurance company to determine out of pocket expense if they have not yet received this vaccine. Advised may also receive vaccine at local pharmacy or Health Dept. Verbalized acceptance and understanding.  Screening Tests Health Maintenance  Topic Date Due   COVID-19 Vaccine (5 - 2024-25 season) 04/04/2023 (Originally 11/05/2022)   Zoster Vaccines- Shingrix (2 of 2) 06/17/2023 (Originally 02/17/2021)   DTaP/Tdap/Td (1 - Tdap) 03/18/2024 (Originally 04/23/1952)   Medicare Annual Wellness (AWV)  03/18/2024   Pneumonia Vaccine 97+ Years old  Completed   INFLUENZA VACCINE  Completed   DEXA SCAN  Completed   HPV VACCINES  Aged Out   FOOT EXAM  Discontinued   HEMOGLOBIN A1C  Discontinued   OPHTHALMOLOGY EXAM   Discontinued    Health Maintenance  There are no preventive care reminders to display for this patient.   Colorectal cancer screening: No longer required.   Mammogram status: No longer required due to age.  Lung Cancer Screening: (Low Dose CT Chest recommended if Age 20-80 years, 20 pack-year currently smoking OR have quit w/in 15years.) does not qualify.   Lung Cancer Screening Referral: no  Additional Screening:  Hepatitis C Screening: does not qualify; Completed no  Vision Screening: Recommended annual ophthalmology exams for early detection of glaucoma and other disorders of the eye. Is the patient up to date with their annual eye exam?  Yes  Who is the provider or what is the name of the office in which the patient attends annual eye exams? [facility] If pt is not established with a provider, would they like to be referred to a provider to establish care? No .   Dental Screening: Recommended annual dental exams for proper oral hygiene  Diabetic Foot Exam: n/a  Community Resource Referral / Chronic Care Management: CRR required this visit?  No   CCM required this visit?  Appt scheduled with PCP   Plan:     I have personally reviewed and noted the following in the patient's chart:   Medical and social history Use of alcohol, tobacco or illicit drugs  Current medications and supplements including opioid prescriptions. Patient is not currently taking opioid prescriptions. Functional ability and status Nutritional status Physical activity Advanced directives List of other physicians Hospitalizations, surgeries, and ER visits in previous 12 months Vitals Screenings to include cognitive, depression, and falls Referrals and appointments  In addition, I have reviewed and discussed with patient certain preventive protocols, quality metrics, and best practice recommendations. A written personalized care plan for preventive services as well as general preventive health  recommendations were provided to patient.    Erminio LITTIE Saris, LPN   [id number]   After Visit Summary: (MyChart) Due to this being a telephonic visit, the after visit summary with patients personalized plan was offered to patient via MyChart   Nurse Notes: The patient states she is doing well and has no concerns or questions at this time. Pt brought in by sister as she no longer drives due to poor vision.

## 2023-03-19 NOTE — Patient Instructions (Signed)
 Ms. Lebron , Thank you for taking time to come for your Medicare Wellness Visit. I appreciate your ongoing commitment to your health goals. Please review the following plan we discussed and let me know if I can assist you in the future.   Referrals/Orders/Follow-Ups/Clinician Recommendations: none  This is a list of the screening recommended for you and due dates:  Health Maintenance  Topic Date Due   COVID-19 Vaccine (5 - 2024-25 season) 04/04/2023*   Zoster (Shingles) Vaccine (2 of 2) 06/17/2023*   DTaP/Tdap/Td vaccine (1 - Tdap) 03/18/2024*   Medicare Annual Wellness Visit  03/18/2024   Pneumonia Vaccine  Completed   Flu Shot  Completed   DEXA scan (bone density measurement)  Completed   HPV Vaccine  Aged Out   Complete foot exam   Discontinued   Hemoglobin A1C  Discontinued   Eye exam for diabetics  Discontinued  *Topic was postponed. The date shown is not the original due date.    Advanced directives: (Copy Requested) Please bring a copy of your health care power of attorney and living will to the office to be added to your chart at your convenience.  Next Medicare Annual Wellness Visit scheduled for next year: Yes 03/19/2024 @ 2:20pm in person

## 2023-03-20 ENCOUNTER — Ambulatory Visit (INDEPENDENT_AMBULATORY_CARE_PROVIDER_SITE_OTHER): Payer: HMO

## 2023-03-20 ENCOUNTER — Ambulatory Visit: Payer: Medicare Other

## 2023-03-20 DIAGNOSIS — E538 Deficiency of other specified B group vitamins: Secondary | ICD-10-CM

## 2023-03-20 MED ORDER — CYANOCOBALAMIN 1000 MCG/ML IJ SOLN
1000.0000 ug | Freq: Once | INTRAMUSCULAR | Status: AC
Start: 2023-03-20 — End: 2023-03-20
  Administered 2023-03-20: 1000 ug via INTRAMUSCULAR

## 2023-03-20 NOTE — Progress Notes (Signed)
Per orders of Dr. Javier Gutierrez, injection of B-12 given by Joellen Y Thompson in right deltoid. Patient tolerated injection well. Patient will make appointment for 1 month.    

## 2023-03-26 DIAGNOSIS — H11153 Pinguecula, bilateral: Secondary | ICD-10-CM | POA: Diagnosis not present

## 2023-03-26 DIAGNOSIS — H2513 Age-related nuclear cataract, bilateral: Secondary | ICD-10-CM | POA: Diagnosis not present

## 2023-03-29 DIAGNOSIS — C44529 Squamous cell carcinoma of skin of other part of trunk: Secondary | ICD-10-CM | POA: Diagnosis not present

## 2023-03-29 DIAGNOSIS — R208 Other disturbances of skin sensation: Secondary | ICD-10-CM | POA: Diagnosis not present

## 2023-03-29 DIAGNOSIS — D485 Neoplasm of uncertain behavior of skin: Secondary | ICD-10-CM | POA: Diagnosis not present

## 2023-04-05 DIAGNOSIS — C44529 Squamous cell carcinoma of skin of other part of trunk: Secondary | ICD-10-CM | POA: Diagnosis not present

## 2023-04-12 ENCOUNTER — Ambulatory Visit: Payer: HMO

## 2023-04-13 ENCOUNTER — Ambulatory Visit: Payer: HMO | Attending: Cardiology | Admitting: Cardiology

## 2023-04-13 ENCOUNTER — Encounter: Payer: Self-pay | Admitting: Cardiology

## 2023-04-13 VITALS — BP 126/60 | HR 70 | Ht 67.5 in | Wt 134.2 lb

## 2023-04-13 DIAGNOSIS — Z9889 Other specified postprocedural states: Secondary | ICD-10-CM

## 2023-04-13 DIAGNOSIS — I4821 Permanent atrial fibrillation: Secondary | ICD-10-CM | POA: Diagnosis not present

## 2023-04-13 DIAGNOSIS — D6869 Other thrombophilia: Secondary | ICD-10-CM | POA: Diagnosis not present

## 2023-04-13 DIAGNOSIS — Z950 Presence of cardiac pacemaker: Secondary | ICD-10-CM

## 2023-04-13 DIAGNOSIS — I5032 Chronic diastolic (congestive) heart failure: Secondary | ICD-10-CM

## 2023-04-13 LAB — CUP PACEART INCLINIC DEVICE CHECK
Battery Remaining Longevity: 37 mo
Battery Voltage: 2.92 V
Brady Statistic RA Percent Paced: 0 %
Brady Statistic RV Percent Paced: 99 %
Date Time Interrogation Session: 20250207153727
Implantable Lead Connection Status: 753985
Implantable Lead Implant Date: 20160217
Implantable Lead Location: 753860
Implantable Lead Model: 1948
Implantable Pulse Generator Implant Date: 20160217
Lead Channel Impedance Value: 512.5 Ohm
Lead Channel Pacing Threshold Amplitude: 0.75 V
Lead Channel Pacing Threshold Amplitude: 0.75 V
Lead Channel Pacing Threshold Pulse Width: 0.4 ms
Lead Channel Pacing Threshold Pulse Width: 0.4 ms
Lead Channel Sensing Intrinsic Amplitude: 8.8 mV
Lead Channel Setting Pacing Amplitude: 1 V
Lead Channel Setting Pacing Pulse Width: 0.4 ms
Lead Channel Setting Sensing Sensitivity: 2.5 mV
Pulse Gen Model: 2240
Pulse Gen Serial Number: 3050195

## 2023-04-13 NOTE — Progress Notes (Signed)
 Electrophysiology Clinic Note    Date:  04/13/2023  Patient ID:  Sierra Kaiser 1933/09/29, MRN [id number] PCP:  Perla Evalene PARAS, MD  Cardiologist:  None Electrophysiologist: Elspeth Sage, MD   Discussed the use of AI scribe software for clinical note transcription with the patient, who gave verbal consent to proceed.   Patient Profile    Chief Complaint: PPM follow-up  History of Present Illness: Sierra Kaiser is a 89 y.o. female with PMH notable for perm afib, HFmrEF, AVN ablation, PPM in situ, NICM, mild-mod MR; seen today for Elspeth Sage, MD for routine electrophysiology followup.  Last saw Dr. Sage 2022.  She more recently saw [facility] 06/2022 where she was feeling well, Planning for updated TTE to eval LVEF and MR.   On follow-up today, she is having ongoing drunk feeling with unsteadiness on her feet. She has been eval'd by neurology and PCP without improvement. She has no cardiac compalints, she denies chest pain, palpitations, dyspnea, PND, orthopnea, nausea, vomiting, syncope, edema, weight gain, or early satiety.    She diligently takes xarelto  daily, no bleeding concerns   Device Information: [employer] Jude single chamber PPM, imp 04/2014; dx afib  AAD History: Amiodarone  - SE (neuro toxicity) Tikosyn - ineffective    ROS:  Please see the history of present illness. All other systems are reviewed and otherwise negative.    Physical Exam    VS:  BP 126/60 (BP Location: Left Arm, Patient Position: Sitting, Cuff Size: Normal)   Pulse 70   Ht 5' 7.5 (1.715 m)   Wt 134 lb 4 oz (60.9 kg)   SpO2 97%   BMI 20.72 kg/m  BMI: Body mass index is 20.72 kg/m.  Wt Readings from Last 3 Encounters:  04/13/23 134 lb 4 oz (60.9 kg)  03/19/23 134 lb (60.8 kg)  08/14/22 136 lb (61.7 kg)     GEN- The patient is well appearing, alert and oriented x 3 today.   Lungs- Clear to ausculation bilaterally, normal work of breathing.  Heart- Regular rate and rhythm, no  murmurs, rubs or gallops Extremities- No peripheral edema, warm, dry Skin-   device pocket well-healed, no tethering   Device interrogation done today and reviewed by myself:  Battery 3 years Lead threshold, impedence stable  Dependent down to VVI 40 No episodes Histograms blunted Increased slope of rate response No further changes made today   Studies Reviewed   Previous EP, cardiology notes.    EKG is ordered. Personal review of EKG from today shows:    EKG Interpretation Date/Time:  Friday April 13 2023 14:23:46 EST Ventricular Rate:  70 PR Interval:    QRS Duration:  160 QT Interval:  450 QTC Calculation: 486 R Axis:   -62  Text Interpretation: Ventricular-paced rhythm Confirmed by Braylin Formby [phone]) on 04/13/2023 2:26:41 PM    TTE, 07/20/2022  1. Left ventricular ejection fraction, by estimation, is 55 to 60%. The left ventricle has normal function. The left ventricle has no regional wall motion abnormalities. Left ventricular diastolic parameters are consistent with Grade I diastolic  dysfunction (impaired relaxation).   2. Right ventricular systolic function is normal. The right ventricular size is normal. There is normal pulmonary artery systolic pressure. The estimated right ventricular systolic pressure is 33.5 mmHg.   3. Left atrial size was moderately dilated.   4. Right atrial size was moderately dilated.   5. The mitral valve is normal in structure. Moderate mitral valve regurgitation.  No evidence of mitral stenosis.   6. Tricuspid valve regurgitation is moderate.   7. The aortic valve is normal in structure. Aortic valve regurgitation is mild to moderate. No aortic stenosis is present.   8. The inferior vena cava is normal in size with greater than 50% respiratory variability, suggesting right atrial pressure of 3 mmHg.   TTE, 09/17/2018 1. The left ventricle has mildly reduced systolic function, with an ejection fraction of 45-50%. The cavity size was  mildly dilated. Left ventricular diastolic Doppler parameters are indeterminate. Left ventricular diffuse hypokinesis.   2. The right ventricle has mildly reduced systolic function. The cavity was mildly enlarged. There is no increase in right ventricular wall thickness. Right ventricular systolic pressure is probably upper normal to mildly elevated (25-30 mmHg plus  central venous pressure).   3. Left atrial size was moderately dilated.   4. Right atrial size was mildly dilated.   5. The mitral valve is degenerative. Mild thickening of the mitral valve leaflet. There is mild mitral annular calcification present. Mitral valve regurgitation is moderate by color flow Doppler.   6. The tricuspid valve is grossly normal. Tricuspid valve regurgitation is moderate-severe.   7. The aortic valve is tricuspid. Aortic valve regurgitation is probably mild to moderate by color flow Doppler, though continuous wave Doppler envelope is suboptimal for evaluation of regurgitation.   8. The aortic root and ascending aorta are normal in size and structure.   9. The interatrial septum was not well visualized.   TTE, 03/13/2017 - Left ventricle: The cavity size was normal. Wall thickness was increased in a pattern of mild LVH. Systolic function was mildly reduced. The estimated ejection fraction was in the range of 45% to 50%. Regional wall motion abnormalities cannot be excluded. The study is not technically sufficient to allow evaluation of LV diastolic function.  - Aortic valve: There was mild to moderate regurgitation.  - Mitral valve: There was mild regurgitation.  - Left atrium: The atrium was mildly to moderately dilated.  - Right ventricle: The cavity size was normal. Systolic function was mildly reduced.  - Right atrium: The atrium was mildly dilated.      Assessment and Plan     #) perm afib #) s/p AVN ablation #) PPM in situ Most recent echo with preserved LVEF 99% VP Asymptomatic of AFib  symptoms  #) Hypercoag d/t perm afib CHA2DS2-VASc Score = at least 4 [CHF History: 1, HTN History: 0, Diabetes History: 0, Stroke History: 0, Vascular Disease History: 0, Age Score: 2, Gender Score: 1].  Therefore, the patient's annual risk of stroke is 4.8 %.    Stroke ppx - 15mg  xarelto  daily, dose appropriately reduced for CrCl < 46ml/min No bleeding concerns  #) HFimpEF LVEF previously 45-50%, now improved to 55-60% Continue to closely monitor LVEF with RV apical pacing       Current medicines are reviewed at length with the patient today.   The patient does not have concerns regarding her medicines.  The following changes were made today:  none  Labs/ tests ordered today include:  Orders Placed This Encounter  Procedures   EKG 12-Lead     Disposition: Follow up with Dr. Fernande or EP APP in 12 months  Follow-up with Dr. Gollan or gen cards APP 6 months   Signed, Chantal Needle, NP  04/13/23  3:26 PM  Electrophysiology [facility]

## 2023-04-13 NOTE — Patient Instructions (Signed)
 Medication Instructions:  The current medical regimen is effective;  continue present plan and medications.  *If you need a refill on your cardiac medications before your next appointment, please call your pharmacy*   Follow-Up: At [facility], you and your health needs are our priority.  As part of our continuing mission to provide you with exceptional heart care, we have created designated Provider Care Teams.  These Care Teams include your primary Cardiologist (physician) and Advanced Practice Providers (APPs -  Physician Assistants and Nurse Practitioners) who all work together to provide you with the care you need, when you need it.  We recommend signing up for the patient portal called "MyChart".  Sign up information is provided on this After Visit Summary.  MyChart is used to connect with patients for Virtual Visits (Telemedicine).  Patients are able to view lab/test results, encounter notes, upcoming appointments, etc.  Non-urgent messages can be sent to your provider as well.   To learn more about what you can do with MyChart, go to [email or web address].    Your next appointment:   12 month(s)  Provider:   Nobie Putnam, MD or Sherie Don, NP

## 2023-04-17 ENCOUNTER — Encounter: Payer: Self-pay | Admitting: Family Medicine

## 2023-04-17 ENCOUNTER — Ambulatory Visit (INDEPENDENT_AMBULATORY_CARE_PROVIDER_SITE_OTHER): Payer: HMO | Admitting: Family Medicine

## 2023-04-17 VITALS — BP 126/64 | HR 73 | Temp 97.8°F | Ht 66.5 in | Wt 131.4 lb

## 2023-04-17 DIAGNOSIS — R768 Other specified abnormal immunological findings in serum: Secondary | ICD-10-CM | POA: Diagnosis not present

## 2023-04-17 DIAGNOSIS — M810 Age-related osteoporosis without current pathological fracture: Secondary | ICD-10-CM

## 2023-04-17 DIAGNOSIS — Z7189 Other specified counseling: Secondary | ICD-10-CM

## 2023-04-17 DIAGNOSIS — K5909 Other constipation: Secondary | ICD-10-CM

## 2023-04-17 DIAGNOSIS — N1832 Chronic kidney disease, stage 3b: Secondary | ICD-10-CM | POA: Diagnosis not present

## 2023-04-17 DIAGNOSIS — E559 Vitamin D deficiency, unspecified: Secondary | ICD-10-CM

## 2023-04-17 DIAGNOSIS — N3941 Urge incontinence: Secondary | ICD-10-CM

## 2023-04-17 DIAGNOSIS — E611 Iron deficiency: Secondary | ICD-10-CM | POA: Diagnosis not present

## 2023-04-17 DIAGNOSIS — Z950 Presence of cardiac pacemaker: Secondary | ICD-10-CM | POA: Diagnosis not present

## 2023-04-17 DIAGNOSIS — E538 Deficiency of other specified B group vitamins: Secondary | ICD-10-CM

## 2023-04-17 DIAGNOSIS — Z0001 Encounter for general adult medical examination with abnormal findings: Secondary | ICD-10-CM | POA: Diagnosis not present

## 2023-04-17 DIAGNOSIS — G3184 Mild cognitive impairment, so stated: Secondary | ICD-10-CM | POA: Diagnosis not present

## 2023-04-17 DIAGNOSIS — D472 Monoclonal gammopathy: Secondary | ICD-10-CM

## 2023-04-17 DIAGNOSIS — I442 Atrioventricular block, complete: Secondary | ICD-10-CM

## 2023-04-17 DIAGNOSIS — I4821 Permanent atrial fibrillation: Secondary | ICD-10-CM

## 2023-04-17 DIAGNOSIS — I5032 Chronic diastolic (congestive) heart failure: Secondary | ICD-10-CM

## 2023-04-17 DIAGNOSIS — H6121 Impacted cerumen, right ear: Secondary | ICD-10-CM | POA: Diagnosis not present

## 2023-04-17 DIAGNOSIS — E785 Hyperlipidemia, unspecified: Secondary | ICD-10-CM | POA: Diagnosis not present

## 2023-04-17 DIAGNOSIS — E038 Other specified hypothyroidism: Secondary | ICD-10-CM

## 2023-04-17 MED ORDER — FLUTICASONE PROPIONATE 50 MCG/ACT NA SUSP: 2.0000 | Freq: Every day | NASAL | 4 refills | Status: AC

## 2023-04-17 MED ORDER — EZETIMIBE 10 MG PO TABS: 10.0000 mg | ORAL_TABLET | Freq: Every day | ORAL | 4 refills | Status: AC

## 2023-04-17 MED ORDER — CYANOCOBALAMIN 1000 MCG/ML IJ SOLN
1000.0000 ug | Freq: Once | INTRAMUSCULAR | Status: AC
  Administered 2023-04-17: 1000 ug via INTRAMUSCULAR

## 2023-04-17 MED ORDER — ROSUVASTATIN CALCIUM 20 MG PO TABS: 20.0000 mg | ORAL_TABLET | ORAL | 4 refills | Status: DC

## 2023-04-17 NOTE — Patient Instructions (Addendum)
Labs today then B12 shot Stop oxybutynin.  For constipation, try over the counter Colace (docusate) stool softener Check with [facility] about dates of 2 shingles shots. If not completed, get 2nd shingles shot.  Call to schedule mammogram at your convenience: [facility] [phone] Goal 1200mg  calcium daily intake in the diet.  Continue vitamin D 1000 units daily.  Right ear irrigation today  Return in 3 months for follow up visit

## 2023-04-17 NOTE — Progress Notes (Unsigned)
Ph: [phone] Fax: [phone]   Patient ID: Sierra Kaiser, female    DOB: [birth year], 89 y.o.   MRN: [id number]  This visit was conducted in person.  BP 126/64   Pulse 73   Temp 97.8 F (36.6 C) (Oral)   Ht 5' 6.5" (1.689 m)   Wt 131 lb 6 oz (59.6 kg)   SpO2 98%   BMI 20.89 kg/m    CC: CPE  Subjective:   HPI: Sierra Kaiser is a 89 y.o. female presenting on 04/17/2023 for Annual Exam (MCR prt 2 [AWV- 03/19/23]. Pt accompanied by sister, [name]. )   Saw health advisor last month for medicare wellness visit. Note reviewed.   No longer driving - her sister drove her here today.  She is now using life alert.   No results found.  Flowsheet Row Clinical Support from 03/19/2023 in [facility] [place]  PHQ-2 Total Score 0          03/19/2023    1:53 PM 07/10/2022   12:10 PM 04/10/2022   11:49 AM 05/03/2021   11:40 AM 11/02/2020    9:17 AM  Fall Risk   Falls in the past year? 0 1 1 0 0  Number falls in past yr: 0 1 1 0   Injury with Fall? 0 1 0 0   Risk for fall due to : No Fall Risks   No Fall Risks   Follow up Falls prevention discussed;Education provided   Falls prevention discussed    Last seen 08/2022.  She is taking oxybutynin 5mg  nightly for overactive bladder. Still with nocturia x4.  Not using furosemide.   Continues B12 shots, last dose 03/09/2022.  Ongoing urine incontinence - Myrbetriq was not tolerated (02/2022) She continues vitamin D daily replacement.   Saw afib clinic last week, note reviewed - PPM (99% ventricular pacing) s/p AVN ablation with preserved EF on latest echo (HFimprovedEF), continues xarelto 15mg  daily.   Care Team: PCP - [facility] - [place] EP - Ricki Rodriguez NP  ENT - [facility]ate - Dr Dingeldein Neuro - Dr Sherryll Burger [facility]) Derm - Dr Linton Rump Onc - Dr Owens Shark Pulm - Dr Meredeth Ide Rheum - Dr Allena Katz [facility]) GI - Dr Norma Fredrickson [facility]  clinic)  Preventative: COLONOSCOPY WITH PROPOFOL 08/29/2017 - TAs, no follow up recommended Norma Fredrickson, Boykin Nearing, MD) - saw GI 08/2022 for pos iFOB - rec against further evaluation  Well woman - s/p hysterectomy  Mammogram 04/2022 - Birads1 @ [place]. Desires to age out. Discussed home breast exams.  DEXA 12/2015 - osteopenia.  DEXA 01/2021 - T -2.5 total R femur, -2.2 AP spine  Lung cancer screening - not eligible  Flu shot yearly  COVID vaccine - Pfizer 03/2019 x2, booster x2 Pneumovax 2012, prevnar-13 2016  Tetanus - unsure  zostavax ~2014  shingrix - 12/2020 , unsure if she's had second - will check with pharmacy.  Brings living will and HCPOA which was reviewed 07/2022 Thereasa Distance Rimmer then Shephen Rimmer are HCPOA. Does not want prolonged life support if terminal condition.  Seat belt use discussed Sunscreen use discussed. No changing moles on skin. Upcoming derm f/u.  Non smoker  Alcohol - none Dentist q6 mo  Eye exam completed last week Bowel - notes some constipation  Bladder - ongoing urge incontinence - see above   Lives with husband.  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 Interim medical history since our last visit reviewed. Allergies and medications reviewed and updated. Outpatient Medications Prior to Visit  Medication Sig Dispense Refill   Cholecalciferol (VITAMIN D3) 25 MCG (1000 UT) CAPS Take 2 capsules (2,000 Units total) by mouth daily.     cyanocobalamin (,VITAMIN B-12,) 1000 MCG/ML injection Inject 1 mL (1,000 mcg total) into the muscle every 30 (thirty) days.     furosemide (LASIX) 20 MG tablet TAKE 1 TABLET (20 MG TOTAL) BY MOUTH AS NEEDED FOR FLUID OR EDEMA. 30 tablet 3   Polyethyl  Glycol-Propyl Glycol (SYSTANE OP) Place 1 drop into both eyes daily as needed (dry eyes).     Rivaroxaban (XARELTO) 15 MG TABS tablet Take 1 tablet (15 mg total) by mouth daily with supper. 30 tablet 1   ezetimibe (ZETIA) 10 MG tablet Take 1 tablet (10 mg total) by mouth daily. 90 tablet 4   fluticasone (FLONASE) 50 MCG/ACT nasal spray SPRAY 2 SPRAYS INTO EACH NOSTRIL EVERY DAY 48 mL 1   oxybutynin (DITROPAN) 5 MG tablet TAKE 1 TABLET (5 MG TOTAL) BY MOUTH DAILY AS NEEDED (INCONTINENCE). 90 tablet 1   rosuvastatin (CRESTOR) 20 MG tablet Take 1 tablet (20 mg total) by mouth every other day. 45 tablet 4   No facility-administered medications prior to visit.     Per HPI unless specifically indicated in ROS section below Review of Systems  Constitutional:  Negative for activity change, appetite change, chills, fatigue, fever and unexpected weight change.  HENT:  Negative for hearing loss.   Eyes:  Positive for visual disturbance.  Respiratory:  Negative for cough, chest tightness, shortness of breath and wheezing.   Cardiovascular:  Negative for chest pain, palpitations and leg swelling.  Gastrointestinal:  Positive for constipation. Negative for abdominal distention, abdominal pain, blood in stool, diarrhea, nausea and vomiting.  Genitourinary:  Negative for difficulty urinating and hematuria.  Musculoskeletal:  Negative for arthralgias, myalgias and neck pain.  Skin:  Negative for rash.  Neurological:  Negative for dizziness, seizures, syncope and headaches.  Hematological:  Negative for adenopathy. Does not bruise/bleed easily.  Psychiatric/Behavioral:  Negative for dysphoric mood. The patient is not nervous/anxious.     Objective:  BP 126/64   Pulse 73   Temp 97.8 F (36.6 C) (Oral)   Ht 5' 6.5" (1.689 m)   Wt 131 lb 6 oz (59.6 kg)   SpO2 98%   BMI 20.89 kg/m   Wt Readings from Last 3 Encounters:  04/17/23 131 lb 6 oz (59.6 kg)  04/13/23 134 lb 4 oz (60.9 kg)  03/19/23 134 lb  (60.8 kg)      Physical Exam Vitals and nursing note reviewed.  Constitutional:      Appearance: Normal appearance. She is not ill-appearing.  HENT:     Head: Normocephalic and atraumatic.     Right Ear: Tympanic membrane, ear canal and external ear normal. There is no impacted cerumen.     Left Ear: Tympanic membrane, ear canal and external ear normal. There is no impacted cerumen.     Mouth/Throat:     Mouth: Mucous membranes are moist.     Pharynx: Oropharynx is clear. No oropharyngeal exudate or posterior oropharyngeal erythema.  Eyes:     General:  Right eye: No discharge.        Left eye: No discharge.     Extraocular Movements: Extraocular movements intact.     Conjunctiva/sclera: Conjunctivae normal.     Pupils: Pupils are equal, round, and reactive to light.  Neck:     Thyroid: No thyroid mass or thyromegaly.     Vascular: No carotid bruit.  Cardiovascular:     Rate and Rhythm: Normal rate and regular rhythm.     Pulses: Normal pulses.     Heart sounds: Normal heart sounds. No murmur heard. Pulmonary:     Effort: Pulmonary effort is normal. No respiratory distress.     Breath sounds: Normal breath sounds. No wheezing, rhonchi or rales.  Abdominal:     General: Bowel sounds are normal. There is no distension.     Palpations: Abdomen is soft. There is no mass.     Tenderness: There is no abdominal tenderness. There is no guarding or rebound.     Hernia: No hernia is present.  Musculoskeletal:     Cervical back: Normal range of motion and neck supple. No rigidity.     Right lower leg: No edema.     Left lower leg: No edema.  Lymphadenopathy:     Cervical: No cervical adenopathy.  Skin:    General: Skin is warm and dry.     Findings: No rash.  Neurological:     General: No focal deficit present.     Mental Status: She is alert. Mental status is at baseline.  Psychiatric:        Mood and Affect: Mood normal.        Behavior: Behavior normal.        Results for orders placed or performed in visit on 04/17/23  Lipid panel   Collection Time: 04/17/23  3:47 PM  Result Value Ref Range   Cholesterol 132 0 - 200 mg/dL   Triglycerides 62.1 0.0 - 149.0 mg/dL   HDL 30.86 >57.84 mg/dL   VLDL 69.6 0.0 - 29.5 mg/dL   LDL Cholesterol 54 0 - 99 mg/dL   Total CHOL/HDL Ratio 2    NonHDL 71.13   Comprehensive metabolic panel   Collection Time: 04/17/23  3:47 PM  Result Value Ref Range   Sodium 142 135 - 145 mEq/L   Potassium 5.6 No hemolysis seen (H) 3.5 - 5.1 mEq/L   Chloride 106 96 - 112 mEq/L   CO2 28 19 - 32 mEq/L   Glucose, Bld 93 70 - 99 mg/dL   BUN 23 6 - 23 mg/dL   Creatinine, Ser 2.84 (H) 0.40 - 1.20 mg/dL   Total Bilirubin 0.6 0.2 - 1.2 mg/dL   Alkaline Phosphatase 58 39 - 117 U/L   AST 14 0 - 37 U/L   ALT 11 0 - 35 U/L   Total Protein 6.7 6.0 - 8.3 g/dL   Albumin 4.6 3.5 - 5.2 g/dL   GFR 13.24 (L) >40.10 mL/min   Calcium 10.1 8.4 - 10.5 mg/dL  Phosphorus   Collection Time: 04/17/23  3:47 PM  Result Value Ref Range   Phosphorus 3.6 2.3 - 4.6 mg/dL  VITAMIN D 25 Hydroxy (Vit-D Deficiency, Fractures)   Collection Time: 04/17/23  3:47 PM  Result Value Ref Range   VITD 39.72 30.00 - 100.00 ng/mL  Parathyroid hormone, intact (no Ca)   Collection Time: 04/17/23  3:47 PM  Result Value Ref Range   PTH 59 16 - 77 pg/mL  CBC with  Differential/Platelet   Collection Time: 04/17/23  3:47 PM  Result Value Ref Range   WBC 6.9 4.0 - 10.5 K/uL   RBC 4.47 3.87 - 5.11 Mil/uL   Hemoglobin 14.7 12.0 - 15.0 g/dL   HCT 10.2 72.5 - 36.6 %   MCV 96.0 78.0 - 100.0 fl   MCHC 34.3 30.0 - 36.0 g/dL   RDW 44.0 34.7 - 42.5 %   Platelets 183.0 150.0 - 400.0 K/uL   Neutrophils Relative % 68.4 43.0 - 77.0 %   Lymphocytes Relative 22.3 12.0 - 46.0 %   Monocytes Relative 6.3 3.0 - 12.0 %   Eosinophils Relative 2.3 0.0 - 5.0 %   Basophils Relative 0.7 0.0 - 3.0 %   Neutro Abs 4.7 1.4 - 7.7 K/uL   Lymphs Abs 1.5 0.7 - 4.0 K/uL   Monocytes  Absolute 0.4 0.1 - 1.0 K/uL   Eosinophils Absolute 0.2 0.0 - 0.7 K/uL   Basophils Absolute 0.0 0.0 - 0.1 K/uL  IBC panel   Collection Time: 04/17/23  3:47 PM  Result Value Ref Range   Iron 76 42 - 145 ug/dL   Transferrin 956.3 875.6 - 360.0 mg/dL   Saturation Ratios 43.3 20.0 - 50.0 %   TIBC 296.8 250.0 - 450.0 mcg/dL  Ferritin   Collection Time: 04/17/23  3:47 PM  Result Value Ref Range   Ferritin 78.1 10.0 - 291.0 ng/mL  Vitamin B12   Collection Time: 04/17/23  3:47 PM  Result Value Ref Range   Vitamin B-12 334 211 - 911 pg/mL   Lab Results  Component Value Date   TSH 4.12 04/10/2022   T3TOTAL 98 06/30/2021       03/19/2023    2:04 PM 08/14/2022   10:21 AM 07/10/2022   12:10 PM 04/10/2022   11:50 AM 05/03/2021   11:41 AM  Depression screen PHQ 2/9  Decreased Interest 0 0 0 2 0  Down, Depressed, Hopeless 0 0 0 0 0  PHQ - 2 Score 0 0 0 2 0  Altered sleeping   0 2   Tired, decreased energy   1 2   Change in appetite   2 1   Feeling bad or failure about yourself    0 0   Trouble concentrating   0 1   Moving slowly or fidgety/restless   0 2   Suicidal thoughts   0 0   PHQ-9 Score   3 10   Difficult doing work/chores   Somewhat difficult Very difficult        08/14/2022   10:21 AM 07/10/2022   12:10 PM 04/10/2022   11:51 AM 11/02/2020   10:11 AM  GAD 7 : Generalized Anxiety Score  Nervous, Anxious, on Edge 0 1 0 2  Control/stop worrying 0 1 0 1  Worry too much - different things 0 1 0 1  Trouble relaxing 0 0 0 0  Restless 0 0 0 0  Easily annoyed or irritable 0 1 1 2   Afraid - awful might happen 0 0 0 0  Total GAD 7 Score 0 4 1 6   Anxiety Difficulty  Not difficult at all     Assessment & Plan:   Problem List Items Addressed This Visit     Encounter for general adult medical examination with abnormal findings - Primary (Chronic)   Preventative protocols reviewed and updated unless pt declined. Discussed healthy diet and lifestyle.  Desires to stop breast cancer  screening with mammogram - encouraged  monthly home breast exams and to let us know if any concerns.       Advanced care planning/counseling discussion (Chronic)   Brings living will and HCPOA which was reviewed 07/2022 - Thereasa Distance Rimmer then Shephen Rimmer are HCPOA. Does not want prolonged life support if terminal condition.       Chronic diastolic CHF (congestive heart failure) (HCC)   Seems euvolemic without recent lasix use.  Appreciate cardiology clinic care      Relevant Medications   ezetimibe (ZETIA) 10 MG tablet   rosuvastatin (CRESTOR) 20 MG tablet   Atrial fibrillation, permanent (HCC)   Chronic, rate controlled off medication, continues anticoagulant xarelto followed by afib clinic.       Relevant Medications   ezetimibe (ZETIA) 10 MG tablet   rosuvastatin (CRESTOR) 20 MG tablet   Other Relevant Orders   CBC with Differential/Platelet (Completed)   Complete heart block (HCC)   S/p AV ablation, PPM placement.       Relevant Medications   ezetimibe (ZETIA) 10 MG tablet   rosuvastatin (CRESTOR) 20 MG tablet   Hyperlipidemia   Chronic, stable on daily zetia and every other day crestor The ASCVD Risk score (Arnett DK, et al., 2019) failed to calculate for the following reasons:   The 2019 ASCVD risk score is only valid for ages 5 to 37       Relevant Medications   ezetimibe (ZETIA) 10 MG tablet   rosuvastatin (CRESTOR) 20 MG tablet   Other Relevant Orders   Lipid panel (Completed)   Comprehensive metabolic panel (Completed)   CKD (chronic kidney disease) stage 3, GFR 30-59 ml/min (HCC)   Chronic, stable. Update renal function.       Relevant Orders   Comprehensive metabolic panel (Completed)   Phosphorus (Completed)   VITAMIN D 25 Hydroxy (Vit-D Deficiency, Fractures) (Completed)   Microalbumin / creatinine urine ratio   Parathyroid hormone, intact (no Ca) (Completed)   CBC with Differential/Platelet (Completed)   Iron deficiency   Update blood counts and  iron stores. She is now off oral iron, no longer anemic.       Relevant Orders   IBC panel (Completed)   Ferritin (Completed)   Osteoporosis   Reviewed latest DEXA, as well as regular dietary calcium intake and vit D replacement. Goal calcium 1200mg  daily, ideally most in diet.  Will order updated DEXA to get done at [facility]      Relevant Orders   DG Bone Density   Chronic constipation   Ongoing struggle.  Discussed starting with colace stool softener daily, update with effect.       Low serum vitamin B12   Update b12 levels then B12 shot today  Hasn't done well with oral replacement previously.       Relevant Orders   Vitamin B12 (Completed)   Subclinical hypothyroidism   Will need updated TFTs next labs - will see if we can add this to blood drawn.       Vitamin D deficiency   Levels ok on 2000 international units  daily       IgG lambda monoclonal gammopathy   Followed by hematology.  Consider updated SPEP, FLC, urine eval next labwork.       Positive ANA (antinuclear antibody)   Saw rheum Dr Allena Katz at [facility] (2023). H/o raynaud's. Elevated ANA thought false positive       S/P placement of cardiac pacemaker   Mild cognitive impairment with memory loss   She  has not returned to Neurology  She stopped aricept.  She no longer drives and husband has vision problems - sister [name] drove her to appt today.  She now has life alert.  Will avoid anticholinergics - stop oxybutynin.       Urge incontinence   Myrbetriq intolerable/ineffective.  Oxybutynin 5mg  nightly ineffective - will stop in h/o cognitive impairment/dementia.       Hearing loss of right ear due to cerumen impaction   S/p cerumen irrigation successfully performed today by Misty Stanley CMA.         Meds ordered this encounter  Medications   ezetimibe (ZETIA) 10 MG tablet    Sig: Take 1 tablet (10 mg total) by mouth daily.    Dispense:  90 tablet    Refill:  4   fluticasone (FLONASE) 50  MCG/ACT nasal spray    Sig: Place 2 sprays into both nostrils daily.    Dispense:  48 mL    Refill:  4   rosuvastatin (CRESTOR) 20 MG tablet    Sig: Take 1 tablet (20 mg total) by mouth every other day.    Dispense:  45 tablet    Refill:  4   cyanocobalamin (VITAMIN B12) injection 1,000 mcg    Orders Placed This Encounter  Procedures   DG Bone Density    Standing Status:   Future    Expiration Date:   04/16/2024    Reason for Exam (SYMPTOM  OR DIAGNOSIS REQUIRED):   osteoporosis f/u    Preferred imaging location?:   [facility]   Lipid panel   Comprehensive metabolic panel   Phosphorus   VITAMIN D 25 Hydroxy (Vit-D Deficiency, Fractures)   Microalbumin / creatinine urine ratio   Parathyroid hormone, intact (no Ca)   CBC with Differential/Platelet   IBC panel   Ferritin   Vitamin B12    Patient Instructions  Labs today then B12 shot Stop oxybutynin.  For constipation, try over the counter Colace (docusate) stool softener Check with [facility] about dates of 2 shingles shots. If not completed, get 2nd shingles shot.  Call to schedule bone density at your convenience: [facility] [phone] Goal 1200mg  calcium daily intake in the diet.  Continue vitamin D 1000 units daily.  Right ear irrigation today  Return in 3 months for follow up visit   Follow up plan: Return in about 3 months (around 07/15/2023) for follow up visit.  Eustaquio Boyden, MD

## 2023-04-17 NOTE — Assessment & Plan Note (Signed)
Preventative protocols reviewed and updated unless pt declined. Discussed healthy diet and lifestyle.  Desires to stop breast cancer screening with mammogram - encouraged monthly home breast exams and to let us know if any concerns.

## 2023-04-17 NOTE — Assessment & Plan Note (Signed)
Brings living will and HCPOA which was reviewed 07/2022 Sierra Kaiser then Sierra Kaiser are HCPOA. Does not want prolonged life support if terminal condition.

## 2023-04-18 LAB — CBC WITH DIFFERENTIAL/PLATELET
Basophils Absolute: 0 10*3/uL (ref 0.0–0.1)
Basophils Relative: 0.7 % (ref 0.0–3.0)
Eosinophils Absolute: 0.2 10*3/uL (ref 0.0–0.7)
Eosinophils Relative: 2.3 % (ref 0.0–5.0)
HCT: 42.9 % (ref 36.0–46.0)
Hemoglobin: 14.7 g/dL (ref 12.0–15.0)
Lymphocytes Relative: 22.3 % (ref 12.0–46.0)
Lymphs Abs: 1.5 10*3/uL (ref 0.7–4.0)
MCHC: 34.3 g/dL (ref 30.0–36.0)
MCV: 96 fL (ref 78.0–100.0)
Monocytes Absolute: 0.4 10*3/uL (ref 0.1–1.0)
Monocytes Relative: 6.3 % (ref 3.0–12.0)
Neutro Abs: 4.7 10*3/uL (ref 1.4–7.7)
Neutrophils Relative %: 68.4 % (ref 43.0–77.0)
Platelets: 183 10*3/uL (ref 150.0–400.0)
RBC: 4.47 Mil/uL (ref 3.87–5.11)
RDW: 13.1 % (ref 11.5–15.5)
WBC: 6.9 10*3/uL (ref 4.0–10.5)

## 2023-04-18 LAB — LIPID PANEL
Cholesterol: 132 mg/dL (ref 0–200)
HDL: 60.9 mg/dL (ref >39.00)
LDL Cholesterol: 54 mg/dL (ref 0–99)
NonHDL: 71.13
Total CHOL/HDL Ratio: 2
Triglycerides: 86 mg/dL (ref 0.0–149.0)
VLDL: 17.2 mg/dL (ref 0.0–40.0)

## 2023-04-18 LAB — COMPREHENSIVE METABOLIC PANEL
ALT: 11 U/L (ref 0–35)
AST: 14 U/L (ref 0–37)
Albumin: 4.6 g/dL (ref 3.5–5.2)
Alkaline Phosphatase: 58 U/L (ref 39–117)
BUN: 23 mg/dL (ref 6–23)
CO2: 28 meq/L (ref 19–32)
Calcium: 10.1 mg/dL (ref 8.4–10.5)
Chloride: 106 meq/L (ref 96–112)
Creatinine, Ser: 1.23 mg/dL — ABNORMAL HIGH (ref 0.40–1.20)
GFR: 39.38 mL/min — ABNORMAL LOW (ref >60.00)
Glucose, Bld: 93 mg/dL (ref 70–99)
Potassium: 5.6 meq/L — ABNORMAL HIGH (ref 3.5–5.1)
Sodium: 142 meq/L (ref 135–145)
Total Bilirubin: 0.6 mg/dL (ref 0.2–1.2)
Total Protein: 6.7 g/dL (ref 6.0–8.3)

## 2023-04-18 LAB — IBC PANEL
Iron: 76 ug/dL (ref 42–145)
Saturation Ratios: 25.6 % (ref 20.0–50.0)
TIBC: 296.8 ug/dL (ref 250.0–450.0)
Transferrin: 212 mg/dL (ref 212.0–360.0)

## 2023-04-18 LAB — VITAMIN B12: Vitamin B-12: 334 pg/mL (ref 211–911)

## 2023-04-18 LAB — FERRITIN: Ferritin: 78.1 ng/mL (ref 10.0–291.0)

## 2023-04-18 LAB — PHOSPHORUS: Phosphorus: 3.6 mg/dL (ref 2.3–4.6)

## 2023-04-18 LAB — VITAMIN D 25 HYDROXY (VIT D DEFICIENCY, FRACTURES): VITD: 39.72 ng/mL (ref 30.00–100.00)

## 2023-04-18 LAB — PARATHYROID HORMONE, INTACT (NO CA): PTH: 59 pg/mL (ref 16–77)

## 2023-04-19 ENCOUNTER — Other Ambulatory Visit (INDEPENDENT_AMBULATORY_CARE_PROVIDER_SITE_OTHER): Payer: HMO

## 2023-04-19 ENCOUNTER — Other Ambulatory Visit: Payer: Self-pay | Admitting: Family Medicine

## 2023-04-19 ENCOUNTER — Encounter: Payer: Self-pay | Admitting: Family Medicine

## 2023-04-19 ENCOUNTER — Telehealth: Payer: Self-pay

## 2023-04-19 DIAGNOSIS — E039 Hypothyroidism, unspecified: Secondary | ICD-10-CM

## 2023-04-19 DIAGNOSIS — N1832 Chronic kidney disease, stage 3b: Secondary | ICD-10-CM

## 2023-04-19 DIAGNOSIS — D472 Monoclonal gammopathy: Secondary | ICD-10-CM

## 2023-04-19 DIAGNOSIS — E875 Hyperkalemia: Secondary | ICD-10-CM

## 2023-04-19 LAB — TSH: TSH: 3.48 u[IU]/mL (ref 0.35–5.50)

## 2023-04-19 LAB — MICROALBUMIN / CREATININE URINE RATIO
Creatinine,U: 244.8 mg/dL
Microalb Creat Ratio: 47.5 mg/g — ABNORMAL HIGH (ref 0.0–30.0)
Microalb, Ur: 11.6 mg/dL — ABNORMAL HIGH (ref 0.0–1.9)

## 2023-04-19 LAB — T4, FREE: Free T4: 0.7 ng/dL (ref 0.60–1.60)

## 2023-04-19 NOTE — Assessment & Plan Note (Signed)
Saw rheum Dr Allena Katz at [place] [facility] (2023). H/o raynaud's. Elevated ANA thought false positive

## 2023-04-19 NOTE — Assessment & Plan Note (Addendum)
Reviewed latest DEXA, as well as regular dietary calcium intake and vit D replacement. Goal calcium 1200mg  daily, ideally most in diet.  Will order updated DEXA to get done at [facility]

## 2023-04-19 NOTE — Assessment & Plan Note (Signed)
S/p cerumen irrigation successfully performed today by Misty Stanley CMA.

## 2023-04-19 NOTE — Assessment & Plan Note (Signed)
S/p AV ablation, PPM placement.

## 2023-04-19 NOTE — Assessment & Plan Note (Signed)
Chronic, rate controlled off medication, continues anticoagulant xarelto followed by afib clinic.

## 2023-04-19 NOTE — Progress Notes (Signed)
Rpt potassium check .

## 2023-04-19 NOTE — Assessment & Plan Note (Addendum)
Update blood counts and iron stores. She is now off oral iron, no longer anemic. Last iron infusion was 07/2022

## 2023-04-19 NOTE — Telephone Encounter (Addendum)
Attempted to contact pt. No answer, no vm. Need to relay lab results, Dr Timoteo Expose message and schedule 1 wk non-fasting lab visit to rpt potassium and next mthly vit B12 shot (after 05/15/23).  Labs/Dr G's msg: Your potassium was very high. Be sure you're not taking any extra potassium replacement/supplements. I recommend you take Lasix (furosemide) 1 tab daily for 2 days and increase water intake. I recommend you return in 1 wk for a non-fasting lab visit to rpt potassium check. I've ordered labs.   Your kidney function remains impaired. Increase water intake to keep kidneys well hydrated.   Your vit B12 remains in low-normal range despite monthly B12 shots. I recommend you  continue this monthly shot and we can recheck your levels in 6 months.   Your liver, sugar, iron levels, cholesterol levels, vit D and blood counts were normal. I gave you the phone number to call [facility] to schedule DEXA scan (but I mistakenly wrote mammogram in your patient instructions).

## 2023-04-19 NOTE — Assessment & Plan Note (Addendum)
Will need updated TFTs next labs - will see if we can add this to blood drawn.

## 2023-04-19 NOTE — Assessment & Plan Note (Signed)
Chronic, stable. Update renal function.

## 2023-04-19 NOTE — Assessment & Plan Note (Signed)
Seems euvolemic without recent lasix use.  Appreciate cardiology clinic care

## 2023-04-19 NOTE — Assessment & Plan Note (Signed)
Levels ok on 2000 international units  daily

## 2023-04-19 NOTE — Assessment & Plan Note (Addendum)
She has not returned to Neurology  She stopped aricept.  She no longer drives and husband has vision problems - sister Sierra Kaiser drove her to appt today.  She now has life alert.  Will avoid anticholinergics - stop oxybutynin.

## 2023-04-19 NOTE — Assessment & Plan Note (Signed)
Myrbetriq intolerable/ineffective.  Oxybutynin 5mg  nightly ineffective - will stop in h/o cognitive impairment/dementia.

## 2023-04-19 NOTE — Assessment & Plan Note (Addendum)
Update b12 levels then B12 shot today  Hasn't done well with oral replacement previously.

## 2023-04-19 NOTE — Assessment & Plan Note (Signed)
Ongoing struggle.  Discussed starting with colace stool softener daily, update with effect.

## 2023-04-19 NOTE — Assessment & Plan Note (Addendum)
Followed by hematology.  Consider updated SPEP, FLC, urine eval next labwork.

## 2023-04-19 NOTE — Assessment & Plan Note (Signed)
Chronic, stable on daily zetia and every other day crestor The ASCVD Risk score (Arnett DK, et al., 2019) failed to calculate for the following reasons:   The 2019 ASCVD risk score is only valid for ages 3 to 15

## 2023-04-20 NOTE — Telephone Encounter (Signed)
Attempted to contact pt. No answer, no vm. Need to relay lab results, Dr Timoteo Expose message and schedule 1 wk non-fasting lab visit to rpt potassium and next mthly vit B12 shot (after 05/15/23).  Labs/Dr G's msg: Your potassium was very high. Be sure you're not taking any extra potassium replacement/supplements. I recommend you take Lasix (furosemide) 1 tab daily for 2 days and increase water intake. I recommend you return in 1 wk for a non-fasting lab visit to rpt potassium check. I've ordered labs.   Your kidney function remains impaired. Increase water intake to keep kidneys well hydrated.   Your vit B12 remains in low-normal range despite monthly B12 shots. I recommend you  continue this monthly shot and we can recheck your levels in 6 months.   Your liver, sugar, iron levels, cholesterol levels, vit D and blood counts were normal. I gave you the phone number to call [facility] to schedule DEXA scan (but I mistakenly wrote mammogram in your patient instructions).

## 2023-04-23 NOTE — Telephone Encounter (Signed)
Attempted to contact pt. No answer, no vm. Need to relay lab results, Dr Timoteo Expose message and schedule 1 wk non-fasting lab visit to rpt potassium and next mthly vit B12 shot (after 05/15/23). Also, mailed info to pt.   Labs/Dr G's msg: Your potassium was very high. Be sure you're not taking any extra potassium replacement/supplements. I recommend you take Lasix (furosemide) 1 tab daily for 2 days and increase water intake. I recommend you return in 1 wk for a non-fasting lab visit to rpt potassium check. I've ordered labs.   Your kidney function remains impaired. Increase water intake to keep kidneys well hydrated.   Your vit B12 remains in low-normal range despite monthly B12 shots. I recommend you  continue this monthly shot and we can recheck your levels in 6 months.   Your liver, sugar, iron levels, cholesterol levels, vit D and blood counts were normal. I gave you the phone number to call [facility] to schedule DEXA scan (but I mistakenly wrote mammogram in your patient instructions).

## 2023-04-26 ENCOUNTER — Other Ambulatory Visit: Payer: HMO

## 2023-04-30 ENCOUNTER — Other Ambulatory Visit (INDEPENDENT_AMBULATORY_CARE_PROVIDER_SITE_OTHER): Payer: HMO

## 2023-04-30 DIAGNOSIS — N1832 Chronic kidney disease, stage 3b: Secondary | ICD-10-CM | POA: Diagnosis not present

## 2023-04-30 DIAGNOSIS — D472 Monoclonal gammopathy: Secondary | ICD-10-CM | POA: Diagnosis not present

## 2023-04-30 DIAGNOSIS — E875 Hyperkalemia: Secondary | ICD-10-CM

## 2023-04-30 LAB — BASIC METABOLIC PANEL
BUN: 22 mg/dL (ref 6–23)
CO2: 28 meq/L (ref 19–32)
Calcium: 9.9 mg/dL (ref 8.4–10.5)
Chloride: 107 meq/L (ref 96–112)
Creatinine, Ser: 1.13 mg/dL (ref 0.40–1.20)
GFR: 43.59 mL/min — ABNORMAL LOW (ref >60.00)
Glucose, Bld: 85 mg/dL (ref 70–99)
Potassium: 4.4 meq/L (ref 3.5–5.1)
Sodium: 143 meq/L (ref 135–145)

## 2023-05-02 LAB — PE+INTERP(RFX IFE+FLC), S
A/G Ratio: 1.7 (ref 0.7–1.7)
Albumin ELP: 4.3 g/dL (ref 2.9–4.4)
Alpha 1: 0.3 g/dL (ref 0.0–0.4)
Alpha 2: 0.7 g/dL (ref 0.4–1.0)
Beta: 0.8 g/dL (ref 0.7–1.3)
Gamma Globulin: 0.8 g/dL (ref 0.4–1.8)
Globulin, Total: 2.6 g/dL (ref 2.2–3.9)
Total Protein: 6.9 g/dL (ref 6.0–8.5)

## 2023-05-03 LAB — PROTEIN ELECTROPHORESIS, URINE REFLEX
Albumin ELP, Urine: 46.9 %
Alpha-1-Globulin, U: 2.8 %
Alpha-2-Globulin, U: 14.9 %
Beta Globulin, U: 22.6 %
Gamma Globulin, U: 12.8 %
Protein, Ur: 33.6 mg/dL

## 2023-05-07 ENCOUNTER — Telehealth: Payer: Self-pay | Admitting: Cardiovascular Disease

## 2023-05-07 ENCOUNTER — Ambulatory Visit: Payer: Self-pay | Admitting: Family Medicine

## 2023-05-07 NOTE — Telephone Encounter (Signed)
 FYI: This call has been transferred to Access Nurse. Once the result note has been entered staff can address the message at that time.  Patient called in with the following symptoms:  Red Word:dizziness  Patient scheduled a mychart visit for weakness and dizziness going on for about a weak. She scheduled an appointment for tomorrow with Dr. Carollee Massed to access.   Please advise at [facility] [phone]  Message is routed to Provider Pool and [facility] Triage

## 2023-05-07 NOTE — Telephone Encounter (Signed)
 See nurse triage note 05/07/23; pt already has appt scheduled for 05/08/23.

## 2023-05-07 NOTE — Telephone Encounter (Signed)
 See 05/07/23 phn note.

## 2023-05-07 NOTE — Telephone Encounter (Signed)
 Noted.

## 2023-05-07 NOTE — Telephone Encounter (Signed)
 Chief Complaint: Dizziness Symptoms: Loss of balance Frequency: Constant Pertinent Negatives: Patient denies numbness, weakness  Disposition: [] ED /[] Urgent Care (no appt availability in office) / [x] Appointment(In office/virtual)/ []  [facility]/ [] Home Care/ [] Refused Recommended Disposition /[] [facility]/ []  Follow-up with PCP Additional Notes: Patient reports dizziness for the last 1-2 weeks. She states that her dizziness has been improving but is still present. She states that she is off balance and bumps into things while walking. She denies any weakness or numbness. Patient has an appointment scheduled for tomorrow and she has been instructed to call back for new or worsening symptoms. Patient verbalized understanding and agreement with this plan.      Reason for Disposition  [1] MODERATE dizziness (e.g., vertigo; feels very unsteady, interferes with normal activities) AND [2] has NOT been evaluated by doctor (or NP/PA) for this  Answer Assessment - Initial Assessment Questions 1. DESCRIPTION: "Describe your dizziness."     "Like I'm drunk, I keep walking into stuff" 2. VERTIGO: "Do you feel like either you or the room is spinning or tilting?"      Yes 3. LIGHTHEADED: "Do you feel lightheaded?" (e.g., somewhat faint, woozy, weak upon standing)     No 4. SEVERITY: "How bad is it?"  "Can you walk?"   - MILD: Feels slightly dizzy and unsteady, but is walking normally.   - MODERATE: Feels unsteady when walking, but not falling; interferes with normal activities (e.g., school, work).   - SEVERE: Unable to walk without falling, or requires assistance to walk without falling.     Moderate  5. ONSET:  "When did the dizziness begin?"     1-2 weeks 6. AGGRAVATING FACTORS: "Does anything make it worse?" (e.g., standing, change in head position)     Standing  7. CAUSE: "What do you think is causing the dizziness?"     Unsure  8. RECURRENT SYMPTOM: "Have you had  dizziness before?" If Yes, ask: "When was the last time?" "What happened that time?"     No 9. OTHER SYMPTOMS: "Do you have any other symptoms?" (e.g., headache, weakness, numbness, vomiting, earache)     No  Protocols used: Dizziness - Vertigo-A-AH

## 2023-05-08 ENCOUNTER — Ambulatory Visit: Admitting: Family Medicine

## 2023-05-08 ENCOUNTER — Encounter: Payer: Self-pay | Admitting: Family Medicine

## 2023-05-08 ENCOUNTER — Observation Stay
Admission: EM | Admit: 2023-05-08 | Discharge: 2023-05-09 | Disposition: A | Discharge status: Home Health | Attending: Internal Medicine | Admitting: Internal Medicine

## 2023-05-08 ENCOUNTER — Emergency Department

## 2023-05-08 ENCOUNTER — Observation Stay

## 2023-05-08 ENCOUNTER — Other Ambulatory Visit: Payer: Self-pay

## 2023-05-08 DIAGNOSIS — R29818 Other symptoms and signs involving the nervous system: Secondary | ICD-10-CM | POA: Diagnosis not present

## 2023-05-08 DIAGNOSIS — I6521 Occlusion and stenosis of right carotid artery: Secondary | ICD-10-CM | POA: Diagnosis not present

## 2023-05-08 DIAGNOSIS — Z950 Presence of cardiac pacemaker: Secondary | ICD-10-CM | POA: Diagnosis not present

## 2023-05-08 DIAGNOSIS — R42 Dizziness and giddiness: Secondary | ICD-10-CM | POA: Diagnosis not present

## 2023-05-08 DIAGNOSIS — I2510 Atherosclerotic heart disease of native coronary artery without angina pectoris: Secondary | ICD-10-CM | POA: Diagnosis not present

## 2023-05-08 DIAGNOSIS — N1831 Chronic kidney disease, stage 3a: Secondary | ICD-10-CM | POA: Diagnosis present

## 2023-05-08 DIAGNOSIS — I5030 Unspecified diastolic (congestive) heart failure: Secondary | ICD-10-CM | POA: Diagnosis not present

## 2023-05-08 DIAGNOSIS — R110 Nausea: Secondary | ICD-10-CM | POA: Diagnosis not present

## 2023-05-08 DIAGNOSIS — N183 Chronic kidney disease, stage 3 unspecified: Secondary | ICD-10-CM | POA: Diagnosis not present

## 2023-05-08 DIAGNOSIS — I130 Hypertensive heart and chronic kidney disease with heart failure and stage 1 through stage 4 chronic kidney disease, or unspecified chronic kidney disease: Secondary | ICD-10-CM | POA: Diagnosis not present

## 2023-05-08 DIAGNOSIS — I6502 Occlusion and stenosis of left vertebral artery: Secondary | ICD-10-CM | POA: Diagnosis not present

## 2023-05-08 DIAGNOSIS — I4821 Permanent atrial fibrillation: Secondary | ICD-10-CM | POA: Diagnosis present

## 2023-05-08 DIAGNOSIS — Z7901 Long term (current) use of anticoagulants: Secondary | ICD-10-CM | POA: Insufficient documentation

## 2023-05-08 DIAGNOSIS — R55 Syncope and collapse: Principal | ICD-10-CM

## 2023-05-08 DIAGNOSIS — I442 Atrioventricular block, complete: Secondary | ICD-10-CM | POA: Diagnosis not present

## 2023-05-08 DIAGNOSIS — I4891 Unspecified atrial fibrillation: Secondary | ICD-10-CM | POA: Diagnosis not present

## 2023-05-08 DIAGNOSIS — R000 Tachycardia, unspecified: Secondary | ICD-10-CM | POA: Diagnosis not present

## 2023-05-08 DIAGNOSIS — I5032 Chronic diastolic (congestive) heart failure: Secondary | ICD-10-CM | POA: Diagnosis present

## 2023-05-08 DIAGNOSIS — R809 Proteinuria, unspecified: Secondary | ICD-10-CM | POA: Insufficient documentation

## 2023-05-08 DIAGNOSIS — I700 Atherosclerosis of aorta: Secondary | ICD-10-CM | POA: Diagnosis not present

## 2023-05-08 DIAGNOSIS — Z79899 Other long term (current) drug therapy: Secondary | ICD-10-CM | POA: Diagnosis not present

## 2023-05-08 DIAGNOSIS — I10 Essential (primary) hypertension: Secondary | ICD-10-CM | POA: Diagnosis not present

## 2023-05-08 DIAGNOSIS — R531 Weakness: Secondary | ICD-10-CM | POA: Diagnosis not present

## 2023-05-08 LAB — BASIC METABOLIC PANEL
Anion gap: 10 (ref 5–15)
BUN: 38 mg/dL — ABNORMAL HIGH (ref 8–23)
CO2: 24 mmol/L (ref 22–32)
Calcium: 9.5 mg/dL (ref 8.9–10.3)
Chloride: 109 mmol/L (ref 98–111)
Creatinine, Ser: 1.04 mg/dL — ABNORMAL HIGH (ref 0.44–1.00)
GFR, Estimated: 52 mL/min — ABNORMAL LOW (ref >60)
Glucose, Bld: 119 mg/dL — ABNORMAL HIGH (ref 70–99)
Potassium: 3.8 mmol/L (ref 3.5–5.1)
Sodium: 143 mmol/L (ref 135–145)

## 2023-05-08 LAB — URINALYSIS, ROUTINE W REFLEX MICROSCOPIC
Bilirubin Urine: NEGATIVE
Glucose, UA: NEGATIVE mg/dL
Hgb urine dipstick: NEGATIVE
Ketones, ur: 5 mg/dL — AB
Leukocytes,Ua: NEGATIVE
Nitrite: NEGATIVE
Protein, ur: NEGATIVE mg/dL
Specific Gravity, Urine: 1.015 (ref 1.005–1.030)
pH: 6 (ref 5.0–8.0)

## 2023-05-08 LAB — CBC
HCT: 37.6 % (ref 36.0–46.0)
Hemoglobin: 13 g/dL (ref 12.0–15.0)
MCH: 32.8 pg (ref 26.0–34.0)
MCHC: 34.6 g/dL (ref 30.0–36.0)
MCV: 94.9 fL (ref 80.0–100.0)
Platelets: 150 10*3/uL (ref 150–400)
RBC: 3.96 MIL/uL (ref 3.87–5.11)
RDW: 12.4 % (ref 11.5–15.5)
WBC: 4.9 10*3/uL (ref 4.0–10.5)
nRBC: 0 % (ref 0.0–0.2)

## 2023-05-08 LAB — GLUCOSE, CAPILLARY: Glucose-Capillary: 191 mg/dL — ABNORMAL HIGH (ref 70–99)

## 2023-05-08 LAB — CK: Total CK: 65 U/L (ref 38–234)

## 2023-05-08 MED ORDER — ROSUVASTATIN CALCIUM 20 MG PO TABS
20.0000 mg | ORAL_TABLET | ORAL | Status: DC
  Administered 2023-05-08: 20 mg via ORAL
  Filled 2023-05-08: qty 1

## 2023-05-08 MED ORDER — SODIUM CHLORIDE 0.9 % IV BOLUS
1000.0000 mL | Freq: Once | INTRAVENOUS | Status: AC
  Administered 2023-05-08: 1000 mL via INTRAVENOUS

## 2023-05-08 MED ORDER — DIPHENHYDRAMINE HCL 50 MG/ML IJ SOLN
50.0000 mg | Freq: Once | INTRAMUSCULAR | Status: AC
  Filled 2023-05-08: qty 1

## 2023-05-08 MED ORDER — RIVAROXABAN 15 MG PO TABS
15.0000 mg | ORAL_TABLET | Freq: Every day | ORAL | Status: DC
  Administered 2023-05-08: 15 mg via ORAL
  Filled 2023-05-08 (×2): qty 1

## 2023-05-08 MED ORDER — EZETIMIBE 10 MG PO TABS
10.0000 mg | ORAL_TABLET | Freq: Every day | ORAL | Status: DC
  Administered 2023-05-09: 10 mg via ORAL
  Filled 2023-05-08: qty 1

## 2023-05-08 MED ORDER — SODIUM CHLORIDE 0.9% FLUSH
3.0000 mL | Freq: Two times a day (BID) | INTRAVENOUS | Status: DC
  Administered 2023-05-08 – 2023-05-09 (×3): 3 mL via INTRAVENOUS

## 2023-05-08 MED ORDER — MECLIZINE HCL 25 MG PO TABS
25.0000 mg | ORAL_TABLET | Freq: Once | ORAL | Status: AC
  Administered 2023-05-08: 25 mg via ORAL
  Filled 2023-05-08: qty 1

## 2023-05-08 MED ORDER — DIPHENHYDRAMINE HCL 25 MG PO CAPS
50.0000 mg | ORAL_CAPSULE | Freq: Once | ORAL | Status: AC
  Administered 2023-05-08: 50 mg via ORAL
  Filled 2023-05-08: qty 2

## 2023-05-08 MED ORDER — ONDANSETRON HCL 4 MG/2ML IJ SOLN
4.0000 mg | Freq: Four times a day (QID) | INTRAMUSCULAR | Status: DC | PRN
Start: 2023-05-08 — End: 2023-05-09

## 2023-05-08 MED ORDER — ENOXAPARIN SODIUM 40 MG/0.4ML IJ SOSY: 40.0000 mg | PREFILLED_SYRINGE | INTRAMUSCULAR | Status: DC

## 2023-05-08 MED ORDER — METHYLPREDNISOLONE SODIUM SUCC 40 MG IJ SOLR
40.0000 mg | Freq: Once | INTRAMUSCULAR | Status: AC
  Administered 2023-05-08: 40 mg via INTRAVENOUS
  Filled 2023-05-08: qty 1

## 2023-05-08 MED ORDER — ONDANSETRON HCL 4 MG PO TABS
4.0000 mg | ORAL_TABLET | Freq: Four times a day (QID) | ORAL | Status: DC | PRN
Start: 2023-05-08 — End: 2023-05-09

## 2023-05-08 MED ORDER — IOHEXOL 350 MG/ML SOLN
75.0000 mL | Freq: Once | INTRAVENOUS | Status: AC | PRN
  Administered 2023-05-08: 75 mL via INTRAVENOUS

## 2023-05-08 NOTE — H&P (Signed)
 History and Physical    Patient: Sierra Kaiser [id number] DOB: 01-09-1934 DOA: 05/08/2023 DOS: the patient was seen and examined on 05/08/2023 PCP: Eustaquio Boyden, MD  Patient coming from: Home  Chief Complaint:  Chief Complaint  Patient presents with   Weakness   HPI: Sierra Kaiser is a 89 y.o. female with medical history significant of atrial fibrillation, HFpEF, heart block s/p PPM presenting w/ dizziness and presyncope.  Patient reports generalized dizziness since this morning.  Last well-known was yesterday evening.  Symptoms somewhat nonspecific with patient does report generalized dizziness.  No focal hemiparesis.  No reported slurred speech or confusion.  Patient denies any prior episodes like this past.  No chest pain.  Patient states dizziness seems to be most predominant going from sitting to standing.  No abdominal pain, vomiting or diarrhea.  Noted history of pacemaker placement.  No palpitations.  No reported discharging. Presented to the ER afebrile, hemodynamically stable.  White count 4.9, hemoglobin 13, platelets 150, creatinine 1.04.  Urinalysis not indicative of infection.  CT head grossly stable.  Per Dr. Anner Crete, case curbside with Dr. Jerrell Belfast with neurology.  Recommend for CTA head and neck as well as CT head.  Formal neurology consult if any pathology on imaging.  Pending pacemaker interrogation. Review of Systems: As mentioned in the history of present illness. All other systems reviewed and are negative. Past Medical History:  Diagnosis Date   Anxiety    CAD (coronary artery disease)    CHF (congestive heart failure) (HCC)    Esophageal reflux    GI bleeding 2012   during EGD necessitating open surgery   Hiatal hernia    History of blood transfusion    History of diverticulitis    History of UTI    HTN (hypertension)    Hyperlipidemia    Meniere's disease 1970s   Mitral regurgitation    Osteopenia 12/11/2015   T score -1.2 femur, -2.0 spine (12/2015)    Permanent atrial fibrillation (HCC)    a. permanent b. s/p PVI RFA at [facility] 10/14 c. failed amio (neuro toxicity) and Tikosyn d. 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    PUD (peptic ulcer disease)    Raynaud's disease    Scalp laceration, subsequent encounter 07/10/2022   SNHL (sensorineural hearing loss)    right ear   Syncopal episodes    Tinnitus    Tinnitus    Ulcerative colitis (HCC)    per prior records   Urinary incontinence, urge    Past Surgical History:  Procedure Laterality Date   APPENDECTOMY  1973   AV NODE ABLATION N/A 05/06/2014   Procedure: AV NODE ABLATION;  Surgeon: Duke Salvia, MD;  Location: [facility] CATH LAB;  Service: Cardiovascular;  Laterality: N/A;   CARDIAC CATHETERIZATION  2014   [facility]   CARDIAC ELECTROPHYSIOLOGY STUDY AND ABLATION     CHOLECYSTECTOMY  2010   COLONOSCOPY  2010   polyps   COLONOSCOPY WITH PROPOFOL N/A 08/29/2017   TAs, no f/u recommended Norma Fredrickson, Boykin Nearing, MD)   ESOPHAGOGASTRODUODENOSCOPY (EGD) WITH PROPOFOL N/A 08/29/2017   benign biopsies Norma Fredrickson, Boykin Nearing, MD)   HEMORRHOID SURGERY     LAPAROTOMY  2012   EGD biopsy led to bleeding - needed laparotomy to stop bleed (Dr Mayford Knife at [facility])   MOHS SURGERY     back   PERMANENT PACEMAKER INSERTION N/A 04/22/2014   STJ single chamber pacemaker implanted by Dr Graciela Husbands  TRANSESOPHAGEAL ECHOCARDIOGRAM WITH CARDIOVERSION  2014   [facility]   UPPER GI ENDOSCOPY  2012   with polypectomy   VAGINAL HYSTERECTOMY  1973   elective; ovaries remained   Social History:  reports that she has never smoked. She has never used smokeless tobacco. She reports current alcohol use of about 1.0 standard drink of alcohol per week. She reports that she does not use drugs.  Allergies  Allergen Reactions   Gadolinium Derivatives Itching and Rash    Persisted for months   Contrast Media [Iodinated Contrast Media] Hives        Gabapentin Other (See Comments)     Affected mood bad   Metronidazole Itching and Other (See Comments)    Hands really red   Other    Sertraline Other (See Comments)    tremors   Diltiazem Hcl Itching and Rash    Family History  Problem Relation Age of Onset   Arrhythmia Mother    Hypertension Mother    Diabetes Mother    Heart failure Brother    Heart failure Brother    Cancer Brother        colon (with colostomy)   Breast cancer Sister 103   Stroke Sister    Alcohol abuse Brother    Stroke Brother    Diabetes Son     Prior to Admission medications   Medication Sig Start Date End Date Taking? Authorizing Provider  ezetimibe (ZETIA) 10 MG tablet Take 1 tablet (10 mg total) by mouth daily. 04/17/23  Yes Eustaquio Boyden, MD  fluticasone [facility]) 50 MCG/ACT nasal spray Place 2 sprays into both nostrils daily. 04/17/23  Yes Eustaquio Boyden, MD  Rivaroxaban (XARELTO) 15 MG TABS tablet Take 1 tablet (15 mg total) by mouth daily with supper. 12/21/22  Yes Gollan, Tollie Pizza, MD  rosuvastatin (CRESTOR) 20 MG tablet Take 1 tablet (20 mg total) by mouth every other day. 04/17/23  Yes Eustaquio Boyden, MD  Cholecalciferol (VITAMIN D3) 25 MCG (1000 UT) CAPS Take 2 capsules (2,000 Units total) by mouth daily. 04/11/22   Eustaquio Boyden, MD  cyanocobalamin (,VITAMIN B-12,) 1000 MCG/ML injection Inject 1 mL (1,000 mcg total) into the muscle every 30 (thirty) days. 11/27/17   Eustaquio Boyden, MD  furosemide (LASIX) 20 MG tablet TAKE 1 TABLET (20 MG TOTAL) BY MOUTH AS NEEDED FOR FLUID OR EDEMA. 02/01/20   Eustaquio Boyden, MD  Polyethyl Glycol-Propyl Glycol (SYSTANE OP) Place 1 drop into both eyes daily as needed (dry eyes).    [provider]    Physical Exam: Vitals:   05/08/23 0957 05/08/23 1200 05/08/23 1230 05/08/23 1345  BP:  (!) 121/53 121/71 (!) 159/68  Pulse:  69 70 74  Resp:  15 (!) 21 18  Temp: 97.8 F (36.6 C)   97.8 F (36.6 C)  TempSrc: Axillary   Oral  SpO2:  99% 100% 100%  Weight:       Height:       Physical Exam Constitutional:      Appearance: She is normal weight.  HENT:     Head: Normocephalic and atraumatic.     Nose: Nose normal.     Mouth/Throat:     Mouth: Mucous membranes are moist.  Eyes:     Pupils: Pupils are equal, round, and reactive to light.  Cardiovascular:     Rate and Rhythm: Normal rate and regular rhythm.  Abdominal:     General: Bowel sounds are normal.  Neurological:  General: No focal deficit present.     Comments: + mild generalized dizziness  Nonfocal neuro exam      Data Reviewed:  There are no new results to review at this time.  CT Head Wo Contrast CLINICAL DATA:  89 year old female with vertigo, neurologic deficit, unable to stand, last known well 2330 hours.  EXAM: CT HEAD WITHOUT CONTRAST  TECHNIQUE: Contiguous axial images were obtained from the base of the skull through the vertex without intravenous contrast.  RADIATION DOSE REDUCTION: This exam was performed according to the departmental dose-optimization program which includes automated exposure control, adjustment of the mA and/or kV according to patient size and/or use of iterative reconstruction technique.  COMPARISON:  Head CT 07/04/2022.  FINDINGS: Brain: Stable cerebral volume. No midline shift, ventriculomegaly, mass effect, evidence of mass lesion, intracranial hemorrhage or evidence of cortically based acute infarction. Gray-white differentiation is stable and within normal limits for age.  Vascular: Calcified atherosclerosis at the skull base. No suspicious intracranial vascular hyperdensity.  Skull: Osteopenia.  Stable visualized osseous structures.  Sinuses/Orbits: Visualized tympanic cavities, paranasal sinuses and mastoids are clear.  Other: No acute orbit or scalp soft tissue finding.  IMPRESSION: Stable since last year and negative for age noncontrast CT appearance of the Head.  Electronically Signed   By: Odessa Fleming M.D.   On:  05/08/2023 10:39  Lab Results  Component Value Date   WBC 4.9 05/08/2023   HGB 13.0 05/08/2023   HCT 37.6 05/08/2023   MCV 94.9 05/08/2023   PLT 150 05/08/2023   Last metabolic panel Lab Results  Component Value Date   GLUCOSE 119 (H) 05/08/2023   NA 143 05/08/2023   K 3.8 05/08/2023   CL 109 05/08/2023   CO2 24 05/08/2023   BUN 38 (H) 05/08/2023   CREATININE 1.04 (H) 05/08/2023   GFRNONAA 52 (L) 05/08/2023   CALCIUM 9.5 05/08/2023   PHOS 3.6 04/17/2023   PROT 6.9 04/30/2023   ALBUMIN 4.6 04/17/2023   LABGLOB 2.6 04/30/2023   AGRATIO 1.7 04/30/2023   BILITOT 0.6 04/17/2023   ALKPHOS 58 04/17/2023   AST 14 04/17/2023   ALT 11 04/17/2023   ANIONGAP 10 05/08/2023    Assessment and Plan: * Dizziness + generalized dizziness and presyncope since around 7am today (LKN yesterday evening)  Symptoms predominantly going from sitting to standing- orthostatic etiology tentatively higher up on ddx  ? Equivocal improvement w/ trial of meclizine in the ER- consider repeat dosing as appropriate  Noted pacemaker in place- pending interrogation  MRI brain contraindicated in setting of pacemaker  Will plan for neurocardiogenic eval including CTA head and neck, 2D ECHO, repeat CT head in am  Check orthostatics  Gently hydrate patient  Hold offending agents  Monitor   S/P placement of cardiac pacemaker Noted PPM Followed by [facility] Rate controlled atrial fibrillation on EKG  Pending pacemaker interrogation in setting of dizziness/presyncope evaluation    CKD (chronic kidney disease) stage 3, GFR 30-59 ml/min (HCC) Cr 1.04 w/ GFR in the 50s  Monitor    Complete heart block (HCC) S/p pacemaker   Atrial fibrillation, permanent (HCC) S/p ablation  Currently rate controlled  On xarelto    Chronic diastolic CHF (congestive heart failure) (HCC) 2D ECHO 07/2022 w/ EF 55-60% and grade 1 DD  Appears euvolemic  Monitor        Advance Care Planning:   Code Status:  Limited: Do not attempt resuscitation (DNR) -DNR-LIMITED -Do Not Intubate/DNI    Consults:  None at present   Family Communication: No family at the bedside   Severity of Illness: The appropriate patient status for this patient is OBSERVATION. Observation status is judged to be reasonable and necessary in order to provide the required intensity of service to ensure the patient's safety. The patient's presenting symptoms, physical exam findings, and initial radiographic and laboratory data in the context of their medical condition is felt to place them at decreased risk for further clinical deterioration. Furthermore, it is anticipated that the patient will be medically stable for discharge from the hospital within 2 midnights of admission.   Author: Floydene Flock, MD 05/08/2023 2:18 PM  For on call review [email or web address].

## 2023-05-08 NOTE — Evaluation (Addendum)
 Physical Therapy Evaluation Patient Details Name: Sierra Kaiser MRN: [id number] DOB: [birth year] Today's Date: 05/08/2023  History of Present Illness  Pt is an 89 y/o F admitted on 05/08/23 after presenting with c/o dizziness & presyncope. Pt admitted for further work-up. PMH: a-fib, HFpEF, heart block s/p PPM, anxiety, CAD, CHF, GI bleed, hiatal hernia, meniere's disease (1970s), raynaud's disease, R ear sensorineural hearing loss, tinnitus, ulcerative colitis, urinary incontinence  Clinical Impression  Pt seen for PT evaluation with pt agreeable to tx, sister present for session. Pt reports prior to admission she was ambulatory without AD, notes 2 falls in the past 6 months but still active, outside picking up limbs in the woods. On this date, pt is able to complete supine>sit with supervision but requires min assist for STS & step pivot with HHA. Pt notes significant fatigue after transfer to recliner, which is not typical for her. BP checked during session, see below, team made aware. Will continue to follow pt acutely to progress gait & stair negotiation as able.  BP checked in LUE: Semi fowler: 129/90 (101) HR 72 Sitting EOB: 129/72 (86) HR 75 - pt c/o dizziness Standing at 0: 143/91 (109) HR 72 Standing at 2: 129/63 (83) HR 72 - pt with c/o worsening dizziness Sitting in recliner: 147/69 (87) HR 75    No nystagmus noted during session.    If plan is discharge home, recommend the following: A little help with walking and/or transfers;A little help with bathing/dressing/bathroom;Assistance with cooking/housework;Assist for transportation;Help with stairs or ramp for entrance   Can travel by private vehicle        Equipment Recommendations Other (comment) (TBD pending progress)  Recommendations for Other Services       Functional Status Assessment Patient has had a recent decline in their functional status and demonstrates the ability to make significant improvements in function in a  reasonable and predictable amount of time.     Precautions / Restrictions Precautions Precautions: Fall Precaution/Restrictions Comments: BP Restrictions Weight Bearing Restrictions Per Provider Order: No      Mobility  Bed Mobility Overal bed mobility: Needs Assistance Bed Mobility: Supine to Sit     Supine to sit: Supervision, HOB elevated, Used rails (exit L side of bed)          Transfers Overall transfer level: Needs assistance Equipment used: None Transfers: Sit to/from Stand, Bed to chair/wheelchair/BSC Sit to Stand: Min assist   Step pivot transfers: Min assist (BUE HHA)            Ambulation/Gait                  Stairs            Wheelchair Mobility     Tilt Bed    Modified Rankin (Stroke Patients Only)       Balance Overall balance assessment: Needs assistance Sitting-balance support: Feet supported Sitting balance-Leahy Scale: Fair Sitting balance - Comments: supervision static sitting   Standing balance support: During functional activity, Single extremity supported Standing balance-Leahy Scale: Poor                               Pertinent Vitals/Pain Pain Assessment Pain Assessment: Faces Faces Pain Scale: Hurts a little bit Pain Location: chronic RUE pain Pain Descriptors / Indicators: Discomfort Pain Intervention(s): Monitored during session    Home Living Family/patient expects to be discharged to:: Private residence Living Arrangements: Spouse/significant other  Available Help at Discharge: Family Type of Home: House Home Access: Stairs to enter Entrance Stairs-Rails: None (has bookcases & things she can hold to) Secretary/administrator of Steps: 2   Home Layout: One level Home Equipment: Cane - single point      Prior Function               Mobility Comments: ambulatory without AD, walks in the woods & picks up limbs, notes 2 falls in the past 6 months, no longer drives ADLs Comments:  bathes & dresses without assistance, husband does cooking & cleaning     Extremity/Trunk Assessment   Upper Extremity Assessment Upper Extremity Assessment: Overall WFL for tasks assessed    Lower Extremity Assessment Lower Extremity Assessment: Generalized weakness    Cervical / Trunk Assessment Cervical / Trunk Assessment: Normal  Communication   Communication Communication: No apparent difficulties    Cognition Arousal: Alert Behavior During Therapy: WFL for tasks assessed/performed   PT - Cognitive impairments: Memory                       PT - Cognition Comments: AxOx4, decreased recall when trying to describe hx of vertigo Following commands: Intact       Cueing Cueing Techniques: Verbal cues, Tactile cues     General Comments      Exercises     Assessment/Plan    PT Assessment Patient needs continued PT services  PT Problem List Decreased strength;Decreased activity tolerance;Decreased balance;Decreased mobility;Decreased knowledge of use of DME       PT Treatment Interventions DME instruction;Balance training;Gait training;Neuromuscular re-education;Stair training;Functional mobility training;Therapeutic activities;Therapeutic exercise;Patient/family education    PT Goals (Current goals can be found in the Care Plan section)  Acute Rehab PT Goals Patient Stated Goal: get better, find out what's going on PT Goal Formulation: With patient/family Time For Goal Achievement: 05/22/23 Potential to Achieve Goals: Good    Frequency Min 2X/week     Co-evaluation               AM-PAC PT "6 Clicks" Mobility  Outcome Measure Help needed turning from your back to your side while in a flat bed without using bedrails?: None Help needed moving from lying on your back to sitting on the side of a flat bed without using bedrails?: A Little Help needed moving to and from a bed to a chair (including a wheelchair)?: A Little Help needed standing up  from a chair using your arms (e.g., wheelchair or bedside chair)?: A Little Help needed to walk in hospital room?: A Lot Help needed climbing 3-5 steps with a railing? : A Lot 6 Click Score: 17    End of Session   Activity Tolerance: Patient tolerated treatment well;Patient limited by fatigue Patient left: in chair;with chair alarm set;with call bell/phone within reach;with family/visitor present Nurse Communication: Mobility status PT Visit Diagnosis: Unsteadiness on feet (R26.81);Other abnormalities of gait and mobility (R26.89);Dizziness and giddiness (R42);Muscle weakness (generalized) (M62.81)    Time: 1027-2536 PT Time Calculation (min) (ACUTE ONLY): 27 min   Charges:   PT Evaluation $PT Eval Moderate Complexity: 1 Mod   PT General Charges $$ ACUTE PT VISIT: 1 Visit         Aleda Grana, PT, DPT 05/08/23, 3:36 PM   Sandi Mariscal 05/08/2023, 3:34 PM

## 2023-05-08 NOTE — ED Triage Notes (Signed)
 Pt BIB [facility] from Home for weakness/dizziness that started last night. EMS reports pt was unable to get up and walk last night, has not improved this morning. Pt denies any pain/SOB. Pt thinks she may have vertigo. EMS stating negative stroke screen.

## 2023-05-08 NOTE — Assessment & Plan Note (Signed)
 S/p pacemaker

## 2023-05-08 NOTE — Assessment & Plan Note (Signed)
 Noted PPM Followed by [facility] Rate controlled atrial fibrillation on EKG  Pending pacemaker interrogation in setting of dizziness/presyncope evaluation

## 2023-05-08 NOTE — Progress Notes (Signed)
 EP asked to interrogate PPM with patient presenting to hospital after syncope.   Patient has perm AFib s/p AVN ablation.  Device appears to be working normally Battery good RV wire with stable threshold Dependent down to VVI  No episodes to explain syncopal event.   Interrogation printed and placed in chart.     EP will sign off at this time, but remains available.  Please re-consult if needed.     Sherie Don, NP Electrophysiology 05/08/23 4:52 PM

## 2023-05-08 NOTE — ED Notes (Signed)
 Pt stating she was able to get out of bed last night and walk to restroom without feeling dizzy. States she thought she was feeling better last night but felt worse this morning around 8am when she woke up.

## 2023-05-08 NOTE — Telephone Encounter (Signed)
 Copied from CRM [id number]. Topic: Appointments - Appointment Cancel/Reschedule >> May 08, 2023  9:35 AM [place] A wrote: Patient's sister, Tobin Chad called to cancel patient appt for 11:30AM today because patient was rushed to hospital due to stomach and vomiting symptoms. Ms. Tera Helper would like for Dr.G to be aware of this and said there is labs that were completed last week and if he sees anything wrong with the labs to call her please

## 2023-05-08 NOTE — ED Notes (Addendum)
 Pt taken to CT. Pt spitting up water stating the water she drank made her feel dizzy. Pt given tissue to wipe mouth.

## 2023-05-08 NOTE — Assessment & Plan Note (Signed)
 S/p ablation  Currently rate controlled  On xarelto

## 2023-05-08 NOTE — Progress Notes (Signed)
 Per nursing:  "Pt is requesting you call her sister with update Tobin Chad phone number [phone]" No one answered. Left detailed VM for sister.

## 2023-05-08 NOTE — Assessment & Plan Note (Addendum)
+   generalized dizziness and presyncope since around 7am today (LKN yesterday evening)  Symptoms predominantly going from sitting to standing- orthostatic etiology tentatively higher up on ddx  ? Equivocal improvement w/ trial of meclizine in the ER- consider repeat dosing as appropriate  Noted pacemaker in place- pending interrogation  MRI brain contraindicated in setting of pacemaker  Will plan for neurocardiogenic eval including CTA head and neck, 2D ECHO, repeat CT head in am  Check orthostatics  Gently hydrate patient  Hold offending agents  Monitor

## 2023-05-08 NOTE — ED Notes (Signed)
 This RN attempted to stand pt up, pt sat up in bed and had two syncopal episodes. Pt placed back in laying position, Dr. Anner Crete informed.

## 2023-05-08 NOTE — ED Provider Notes (Signed)
 [facility] Provider Note    Event Date/Time   First MD Initiated Contact with Patient 05/08/23 [phone]     (approximate)   History   Weakness   HPI Sierra Kaiser is a 89 y.o. female with history of A-fib on Xarelto, HFpEF, CKD stage III, presenting today for dizziness.  Patient states when she woke up this morning around 7:30 AM, she was experiencing dizziness.  She has been experiencing this since she woke up.  She has not been able to walk because she feels nauseous and off-balance anytime she tries to stand up.  Has not taken any medication for this.  Otherwise denies vision changes, speech changes, numbness, weakness anywhere.  States no recent illnesses.  Denies cough, congestion, chest pain, shortness of breath, abdominal pain, dysuria.  No prior history of stroke.  Last known normal she states was when she went to bed last night at 11:30 PM.     Physical Exam   Triage Vital Signs: ED Triage Vitals  Encounter Vitals Group     BP 05/08/23 0927 (!) 154/66     Systolic BP Percentile --      Diastolic BP Percentile --      Pulse Rate 05/08/23 0927 72     Resp 05/08/23 0927 18     Temp --      Temp src --      SpO2 05/08/23 0927 99 %     Weight 05/08/23 0928 134 lb 7.7 oz (61 kg)     Height 05/08/23 0928 5\' 6"  (1.676 m)     Head Circumference --      Peak Flow --      Pain Score 05/08/23 0928 0     Pain Loc --      Pain Education --      Exclude from Growth Chart --     Most recent vital signs: Vitals:   05/08/23 1230 05/08/23 1345  BP: 121/71 (!) 159/68  Pulse: 70 74  Resp: (!) 21 18  Temp:  97.8 F (36.6 C)  SpO2: 100% 100%   Physical Exam: I have reviewed the vital signs and nursing notes. General: Awake, alert, no acute distress.  Nontoxic appearing. Head:  Atraumatic, normocephalic.   ENT:  EOM intact, PERRL. Oral mucosa is pink and moist with no lesions. Neck: Neck is supple with full range of motion, No meningeal  signs. Cardiovascular:  RRR, No murmurs. Peripheral pulses palpable and equal bilaterally. Respiratory:  Symmetrical chest wall expansion.  No rhonchi, rales, or wheezes.  Good air movement throughout.  No use of accessory muscles.   Musculoskeletal:  No cyanosis or edema. Moving extremities with full ROM Abdomen:  Soft, nontender, nondistended. Neuro:  GCS 15, moving all four extremities, interacting appropriately. Speech clear.  Slight horizontal nystagmus with extraocular motion otherwise cranial nerves II through XII intact.  Alert and oriented.  Sensation equal and intact throughout bilateral upper and lower extremities.  5 out of 5 strength throughout bilateral upper and lower extremities.  Dizziness present when attempting to stand and unable to ambulate. Psych:  Calm, appropriate.   Skin:  Warm, dry, no rash.    ED Results / Procedures / Treatments   Labs (all labs ordered are listed, but only abnormal results are displayed) Labs Reviewed  BASIC METABOLIC PANEL - Abnormal; Notable for the following components:      Result Value   Glucose, Bld 119 (*)    BUN 38 (*)  Creatinine, Ser 1.04 (*)    GFR, Estimated 52 (*)    All other components within normal limits  URINALYSIS, ROUTINE W REFLEX MICROSCOPIC - Abnormal; Notable for the following components:   Color, Urine YELLOW (*)    APPearance CLEAR (*)    Ketones, ur 5 (*)    All other components within normal limits  CBC  CK  CBG MONITORING, ED     EKG My EKG interpretation: Rate of 72, A-fib.  ICD.  No acute ST elevations or depressions   RADIOLOGY Independently interpreted CT head with no acute pathology   PROCEDURES:  Critical Care performed: No  Procedures   MEDICATIONS ORDERED IN ED: Medications  diphenhydrAMINE (BENADRYL) capsule 50 mg (has no administration in time range)    Or  diphenhydrAMINE (BENADRYL) injection 50 mg (has no administration in time range)  sodium chloride flush (NS) 0.9 % injection  3 mL (has no administration in time range)  ondansetron (ZOFRAN) tablet 4 mg (has no administration in time range)    Or  ondansetron (ZOFRAN) injection 4 mg (has no administration in time range)  Rivaroxaban (XARELTO) tablet 15 mg (has no administration in time range)  rosuvastatin (CRESTOR) tablet 20 mg (has no administration in time range)  ezetimibe (ZETIA) tablet 10 mg (has no administration in time range)  sodium chloride 0.9 % bolus 1,000 mL (0 mLs Intravenous Stopped 05/08/23 1122)  meclizine (ANTIVERT) tablet 25 mg (25 mg Oral Given 05/08/23 0957)  methylPREDNISolone sodium succinate (SOLU-MEDROL) 40 mg/mL injection 40 mg (40 mg Intravenous Given 05/08/23 1303)     IMPRESSION / MDM / ASSESSMENT AND PLAN / ED COURSE  I reviewed the triage vital signs and the nursing notes.                              Differential diagnosis includes, but is not limited to, BPPV, schwannoma, CVA, dehydration, infection, syncope, orthostatic hypotension, cardiac arrhythmia  Patient's presentation is most consistent with acute presentation with potential threat to life or bodily function.  Patient is an 89 year old female presenting today for dizziness symptoms.  In 1 instance that started this morning with last known normal last night but family also reports she has had 4 weeks of similar symptoms.  She is outside the window for tPA or TNK.  Neurological exam unremarkable aside from nystagmus with movement bilaterally.  Vital signs otherwise stable.  EKG unremarkable and consistent with her baseline.  CBC, BMP, UA largely unremarkable.  CT head negative.  Any attempt to stand patient causes her essentially to pass out.  More concern for possible syncope as a source of her dizziness symptoms but cannot fully rule out CVA.  Her pacemaker is not compatible with MRI here or at [facility] and therefore cannot transfer for further stroke workup.  Neurologist recommends repeat head CT in 24 hours as well as CTA head  and neck at this time.  Will admit to hospitalist for ongoing evaluation of CVA and syncope workup.  The patient is on the cardiac monitor to evaluate for evidence of arrhythmia and/or significant heart rate changes. Clinical Course as of 05/08/23 1443  Tue May 08, 2023  1128 Urinalysis, Routine w reflex microscopic -Urine, Clean Catch(!) No UTI [DW]  1158 Discussed case with neurology.  He does feel that patient should get an MRI to fully evaluate for CVA.  We will call MRI at [facility] to make sure her pacemaker  is compatible there as it is not compatible with our MRI machines here. [DW]  1251 Neurology recommending repeat head CT in 24 hours.  Also recommend CTA head and neck at this time along with cardiac workup for syncope.  If anything abnormal, consult neurology for further evaluation [DW]    Clinical Course User Index [DW] Janith Lima, MD     FINAL CLINICAL IMPRESSION(S) / ED DIAGNOSES   Final diagnoses:  Syncope and collapse  Dizziness     Rx / DC Orders   ED Discharge Orders     None        Note:  This document was prepared using Dragon voice recognition software and may include unintentional dictation errors.   Janith Lima, MD 05/08/23 [phone]

## 2023-05-08 NOTE — Assessment & Plan Note (Signed)
 2D ECHO 07/2022 w/ EF 55-60% and grade 1 DD  Appears euvolemic  Monitor

## 2023-05-08 NOTE — Assessment & Plan Note (Signed)
 Cr 1.04 w/ GFR in the 50s  Monitor

## 2023-05-09 ENCOUNTER — Observation Stay (HOSPITAL_BASED_OUTPATIENT_CLINIC_OR_DEPARTMENT_OTHER)
Admit: 2023-05-09 | Discharge: 2023-05-09 | Disposition: A | Discharge status: Home / Self Care | Attending: Family Medicine | Admitting: Family Medicine

## 2023-05-09 ENCOUNTER — Ambulatory Visit (INDEPENDENT_AMBULATORY_CARE_PROVIDER_SITE_OTHER): Payer: Medicare Other

## 2023-05-09 DIAGNOSIS — I428 Other cardiomyopathies: Secondary | ICD-10-CM | POA: Diagnosis not present

## 2023-05-09 DIAGNOSIS — R55 Syncope and collapse: Secondary | ICD-10-CM | POA: Diagnosis not present

## 2023-05-09 DIAGNOSIS — R42 Dizziness and giddiness: Secondary | ICD-10-CM | POA: Diagnosis not present

## 2023-05-09 LAB — COMPREHENSIVE METABOLIC PANEL
ALT: 14 U/L (ref 0–44)
AST: 13 U/L — ABNORMAL LOW (ref 15–41)
Albumin: 3.6 g/dL (ref 3.5–5.0)
Alkaline Phosphatase: 41 U/L (ref 38–126)
Anion gap: 6 (ref 5–15)
BUN: 29 mg/dL — ABNORMAL HIGH (ref 8–23)
CO2: 24 mmol/L (ref 22–32)
Calcium: 9.4 mg/dL (ref 8.9–10.3)
Chloride: 113 mmol/L — ABNORMAL HIGH (ref 98–111)
Creatinine, Ser: 0.93 mg/dL (ref 0.44–1.00)
GFR, Estimated: 59 mL/min — ABNORMAL LOW (ref >60)
Glucose, Bld: 100 mg/dL — ABNORMAL HIGH (ref 70–99)
Potassium: 4.5 mmol/L (ref 3.5–5.1)
Sodium: 143 mmol/L (ref 135–145)
Total Bilirubin: 0.8 mg/dL (ref 0.0–1.2)
Total Protein: 5.8 g/dL — ABNORMAL LOW (ref 6.5–8.1)

## 2023-05-09 LAB — GLUCOSE, CAPILLARY
Glucose-Capillary: 101 mg/dL — ABNORMAL HIGH (ref 70–99)
Glucose-Capillary: 75 mg/dL (ref 70–99)

## 2023-05-09 LAB — ECHOCARDIOGRAM COMPLETE
AR max vel: 2.01 cm2
AV Area VTI: 2.25 cm2
AV Area mean vel: 1.76 cm2
AV Mean grad: 2.5 mmHg
AV Peak grad: 4.6 mmHg
Ao pk vel: 1.08 m/s
Area-P 1/2: 4.93 cm2
Height: 66 in
P 1/2 time: 723 ms
S' Lateral: 2.8 cm
Weight: 2151.69 [oz_av]

## 2023-05-09 LAB — CBC
HCT: 33.9 % — ABNORMAL LOW (ref 36.0–46.0)
Hemoglobin: 12 g/dL (ref 12.0–15.0)
MCH: 32.8 pg (ref 26.0–34.0)
MCHC: 35.4 g/dL (ref 30.0–36.0)
MCV: 92.6 fL (ref 80.0–100.0)
Platelets: 158 10*3/uL (ref 150–400)
RBC: 3.66 MIL/uL — ABNORMAL LOW (ref 3.87–5.11)
RDW: 12.5 % (ref 11.5–15.5)
WBC: 7.8 10*3/uL (ref 4.0–10.5)
nRBC: 0 % (ref 0.0–0.2)

## 2023-05-09 MED ORDER — ONDANSETRON HCL 4 MG PO TABS: 4.0000 mg | ORAL_TABLET | Freq: Four times a day (QID) | ORAL | 0 refills | Status: DC | PRN

## 2023-05-09 MED ORDER — SODIUM CHLORIDE 0.9 % IV SOLN: INTRAVENOUS | Status: DC

## 2023-05-09 MED ORDER — MECLIZINE HCL 25 MG PO TABS: 25.0000 mg | ORAL_TABLET | Freq: Three times a day (TID) | ORAL | 0 refills | Status: DC | PRN

## 2023-05-09 NOTE — TOC Initial Note (Signed)
 Transition of Care [facility]) - Initial/Assessment Note    Patient Details  Name: Sierra Kaiser MRN: [id number] Date of Birth: 08-17-1933  [facility] CM/SW Contact:    Erin Sons, LCSW Phone Number: 05/09/2023, 10:47 AM  Clinical Narrative:                  CSW met with pt, spouse, and sister bedside to discuss HH rec. Pt agreeable to CSW arranging HH; no preference. Pt lives at home with spouse at address listed in chart.  Pt states that she will acquire a walker on her own; sister confirms that she will arrange a walker for pt as they will likely want a specific quality of walker.  HH arranged with [facility]   Expected Discharge Plan: Home w Home Health Services Barriers to Discharge: Continued Medical Work up   Patient Goals and CMS Choice Patient states their goals for this hospitalization and ongoing recovery are:: go home CMS Medicare.gov Compare Post Acute Care list provided to:: Patient Choice offered to / list presented to : Patient      Expected Discharge Plan and Services       Living arrangements for the past 2 months: Single Family Home                           HH Arranged: PT, OT HH Agency: [facility] Date [facility] Agency Contacted: 05/09/23 Time HH Agency Contacted: 1046 Representative spoke with at [facility] Agency: Kandee Keen  Prior Living Arrangements/Services Living arrangements for the past 2 months: Single Family Home Lives with:: Spouse Patient language and need for interpreter reviewed:: Yes Do you feel safe going back to the place where you live?: Yes      Need for Family Participation in Patient Care: No (Comment) Care giver support system in place?: Yes (comment)   Criminal Activity/Legal Involvement Pertinent to Current Situation/Hospitalization: No - Comment as needed  Activities of Daily Living   ADL Screening (condition at time of admission) Independently performs ADLs?: Yes (appropriate for developmental age) Is the patient  deaf or have difficulty hearing?: No Does the patient have difficulty seeing, even when wearing glasses/contacts?: No Does the patient have difficulty concentrating, remembering, or making decisions?: No  Permission Sought/Granted                  Emotional Assessment Appearance:: Appears stated age Attitude/Demeanor/Rapport: Engaged Affect (typically observed): Accepting Orientation: : Oriented to Self, Oriented to Place, Oriented to  Time, Oriented to Situation Alcohol / Substance Use: Not Applicable Psych Involvement: No (comment)  Admission diagnosis:  Dizziness [R42] Patient Active Problem List   Diagnosis Date Noted   Microalbuminuria 05/08/2023   Positive occult stool blood test 08/15/2022   Peeling skin 08/14/2022   Hearing loss of right ear due to cerumen impaction 04/11/2022   Urge incontinence 02/03/2022   Imbalance 09/29/2021   Lipoma 11/04/2020   Acquired hypercoagulable state (HCC) 11/04/2020   Mild cognitive impairment with memory loss 07/23/2020   Injury of left rotator cuff 05/03/2020   S/P placement of cardiac pacemaker 12/04/2019   Raynaud's disease 12/02/2019   IgG lambda monoclonal gammopathy 11/08/2019   Positive ANA (antinuclear antibody) 11/08/2019   Subclinical hypothyroidism 10/30/2019   Vitamin D deficiency 10/30/2019   Dizziness 09/24/2019   Pruritus 10/28/2018   Medicare annual wellness visit, subsequent 10/18/2018   Advanced care planning/counseling discussion 10/18/2018   Anxiety 01/28/2018   Low serum  vitamin B12 11/27/2017   Right knee pain 07/17/2017   Chronic fatigue 06/13/2017   Encounter for general adult medical examination with abnormal findings 01/30/2017   Chronic constipation 01/30/2017   Osteoporosis 12/11/2015   Iron deficiency 05/26/2015   Meniere's disease    Hyperlipidemia    CKD (chronic kidney disease) stage 3, GFR 30-59 ml/min (HCC)    Mitral regurgitation    Complete heart block (HCC) 05/06/2014   Atrial  fibrillation, permanent (HCC) 04/22/2014   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 [place] [address] Phone: [phone] Fax: [phone]     Social Drivers of Health (SDOH) Social History: SDOH Screenings   Food Insecurity: No Food Insecurity (05/08/2023)  Housing: Low Risk  (05/08/2023)  Transportation Needs: No Transportation Needs (05/08/2023)  Utilities: Not At Risk (05/08/2023)  Alcohol Screen: Low Risk  (03/19/2023)  Depression (PHQ2-9): Low Risk  (03/19/2023)  Financial Resource Strain: Low Risk  (03/19/2023)  Physical Activity: Inactive (03/19/2023)  Social Connections: Moderately Integrated (05/08/2023)  Stress: No Stress Concern Present (03/19/2023)  Tobacco Use: Low Risk  (05/08/2023)   SDOH Interventions:     Readmission Risk Interventions     No data to display

## 2023-05-09 NOTE — Discharge Summary (Signed)
 Physician Discharge Summary  Sierra Kaiser ZOX:[id number] DOB: Oct 07, 1933 DOA: 05/08/2023  PCP: Eustaquio Boyden, MD  Admit date: 05/08/2023 Discharge date: 05/09/2023  Admitted From: Home Disposition:  Home w home health  Recommendations for Outpatient Follow-up:  Follow up with PCP in 1-2 weeks   Home Health:Yes PT OT Equipment/Devices:None   Discharge Condition:Stable  CODE STATUS:DNR  Diet recommendation: Reg  Brief/Interim Summary:  89 y.o. female with medical history significant of atrial fibrillation, HFpEF, heart block s/p PPM presenting w/ dizziness and presyncope.  Patient reports generalized dizziness since this morning.  Last well-known was yesterday evening.  Symptoms somewhat nonspecific with patient does report generalized dizziness.  No focal hemiparesis.  No reported slurred speech or confusion.  Patient denies any prior episodes like this past.  No chest pain.  Patient states dizziness seems to be most predominant going from sitting to standing.  No abdominal pain, vomiting or diarrhea.  Noted history of pacemaker placement.  No palpitations.  No reported discharging. Presented to the ER afebrile, hemodynamically stable.  White count 4.9, hemoglobin 13, platelets 150, creatinine 1.04.  Urinalysis not indicative of infection.  CT head grossly stable CVA workup unrevealing.  No evidence of CVA or hemodynamically significant atherosclerosis.  Patient back to baseline at time of discharge.  Stable for discharge home.  Home PT and OT ordered.  EP interrogated pacemaker.  No correlation between pacemaker and presyncopal events.    Discharge Diagnoses:  Principal Problem:   Dizziness Active Problems:   Chronic diastolic CHF (congestive heart failure) (HCC)   Atrial fibrillation, permanent (HCC)   Complete heart block (HCC)   CKD (chronic kidney disease) stage 3, GFR 30-59 ml/min (HCC)   S/P placement of cardiac pacemaker  Dizziness Near syncope Unclear etiology.  No  evidence of neurologic insult.  I suspect dehydration/intravascular volume depletion/poor p.o. intake as primary drivers.  No medication changes made at time of discharge.  Home health PT and OT ordered.  Advised on as needed meclizine for symptoms of dizziness.  Will follow-up with PCP and neurology postdischarge.  Discharge Instructions  Discharge Instructions     Diet - low sodium heart healthy   Complete by: As directed    Increase activity slowly   Complete by: As directed       Allergies as of 05/09/2023       Reactions   Gadolinium Derivatives Itching, Rash   Persisted for months   Contrast Media [iodinated Contrast Media] Hives      Gabapentin Other (See Comments)   Affected mood bad   Metronidazole Itching, Other (See Comments)   Hands really red   Other    Sertraline Other (See Comments)   tremors   Diltiazem Hcl Itching, Rash        Medication List     TAKE these medications    cyanocobalamin 1000 MCG/ML injection Commonly known as: VITAMIN B12 Inject 1 mL (1,000 mcg total) into the muscle every 30 (thirty) days.   ezetimibe 10 MG tablet Commonly known as: ZETIA Take 1 tablet (10 mg total) by mouth daily.   fluticasone 50 MCG/ACT nasal spray Commonly known as: FLONASE Place 2 sprays into both nostrils daily.   furosemide 20 MG tablet Commonly known as: LASIX TAKE 1 TABLET (20 MG TOTAL) BY MOUTH AS NEEDED FOR FLUID OR EDEMA.   meclizine 25 MG tablet Commonly known as: ANTIVERT Take 1 tablet (25 mg total) by mouth 3 (three) times daily as needed for dizziness.   ondansetron  4 MG tablet Commonly known as: ZOFRAN Take 1 tablet (4 mg total) by mouth every 6 (six) hours as needed for nausea.   Rivaroxaban 15 MG Tabs tablet Commonly known as: Xarelto Take 1 tablet (15 mg total) by mouth daily with supper.   rosuvastatin 20 MG tablet Commonly known as: CRESTOR Take 1 tablet (20 mg total) by mouth every other day.   SYSTANE OP Place 1 drop into  both eyes daily as needed (dry eyes).   Vitamin D3 25 MCG (1000 UT) Caps Take 2 capsules (2,000 Units total) by mouth daily.        Follow-up Information     [facility] Follow up.   Specialty: Home Health Services Why: Home Health PT is arranged with [facility]; they will call you to schedule appointment Contact information: [address] [place] [address] [phone]                Allergies  Allergen Reactions   Gadolinium Derivatives Itching and Rash    Persisted for months   Contrast Media [Iodinated Contrast Media] Hives        Gabapentin Other (See Comments)    Affected mood bad   Metronidazole Itching and Other (See Comments)    Hands really red   Other    Sertraline Other (See Comments)    tremors   Diltiazem Hcl Itching and Rash    Consultations: None   Procedures/Studies: ECHOCARDIOGRAM COMPLETE Result Date: 05/09/2023    ECHOCARDIOGRAM REPORT   [name] Date of Exam: 05/09/2023 Medical Rec #:  [id number]      Height:       66.0 in Accession #:    [id number]     Weight:       134.5 lb Date of Birth:  08/07/1933      BSA:          1.689 m Patient Age:    89 years       BP:           128/61 mmHg Patient Gender: F              HR:           73 bpm. Exam Location:  [facility] Procedure: 2D Echo, Cardiac Doppler and Color Doppler (Both Spectral and Color            Flow Doppler were utilized during procedure). Indications:     Syncope R55  History:         Patient has prior history of Echocardiogram examinations, most                  recent 07/20/2022.  Sonographer:     Cristela Blue Referring Phys:  [id number] Francoise Schaumann [place] Diagnosing Phys: Julien Nordmann MD IMPRESSIONS  1. Left ventricular ejection fraction, by estimation, is 50 to 55%. The left ventricle has low normal function. The left ventricle has no regional wall motion abnormalities. Left ventricular diastolic parameters are indeterminate.  2. Right ventricular systolic  function is normal. The right ventricular size is normal.  3. Left atrial size was moderately dilated.  4. The mitral valve is normal in structure. Moderate mitral valve regurgitation. No evidence of mitral stenosis.  5. Tricuspid valve regurgitation is moderate.  6. The aortic valve is normal in structure. Aortic valve regurgitation is mild. Aortic valve sclerosis is present, with no evidence of aortic valve stenosis.  7. The inferior vena  cava is normal in size with greater than 50% respiratory variability, suggesting right atrial pressure of 3 mmHg. FINDINGS  Left Ventricle: Left ventricular ejection fraction, by estimation, is 50 to 55%. The left ventricle has low normal function. The left ventricle has no regional wall motion abnormalities. Strain was performed and the global longitudinal strain is indeterminate. The left ventricular internal cavity size was normal in size. There is no left ventricular hypertrophy. Left ventricular diastolic parameters are indeterminate. Right Ventricle: The right ventricular size is normal. No increase in right ventricular wall thickness. Right ventricular systolic function is normal. Left Atrium: Left atrial size was moderately dilated. Right Atrium: Right atrial size was normal in size. Pericardium: There is no evidence of pericardial effusion. Mitral Valve: The mitral valve is normal in structure. There is mild calcification of the mitral valve leaflet(s). Mild mitral annular calcification. Moderate mitral valve regurgitation. No evidence of mitral valve stenosis. Tricuspid Valve: The tricuspid valve is normal in structure. Tricuspid valve regurgitation is moderate . No evidence of tricuspid stenosis. Aortic Valve: The aortic valve is normal in structure. Aortic valve regurgitation is mild. Aortic regurgitation PHT measures 723 msec. Aortic valve sclerosis is present, with no evidence of aortic valve stenosis. Aortic valve mean gradient measures 2.5 mmHg. Aortic valve peak  gradient measures 4.6 mmHg. Aortic valve area, by VTI measures 2.25 cm. Pulmonic Valve: The pulmonic valve was normal in structure. Pulmonic valve regurgitation is not visualized. No evidence of pulmonic stenosis. Aorta: The aortic root is normal in size and structure. Venous: The inferior vena cava is normal in size with greater than 50% respiratory variability, suggesting right atrial pressure of 3 mmHg. IAS/Shunts: No atrial level shunt detected by color flow Doppler. Additional Comments: 3D was performed not requiring image post processing on an independent workstation and was indeterminate. A device lead is visualized.  LEFT VENTRICLE PLAX 2D LVIDd:         4.40 cm LVIDs:         2.80 cm LV PW:         1.00 cm LV IVS:        1.40 cm LVOT diam:     2.00 cm LV SV:         46 LV SV Index:   27 LVOT Area:     3.14 cm  RIGHT VENTRICLE RV Basal diam:  4.20 cm RV Mid diam:    3.20 cm LEFT ATRIUM              Index        RIGHT ATRIUM           Index LA diam:        4.20 cm  2.49 cm/m   RA Area:     20.60 cm LA Vol (A2C):   114.0 ml 67.48 ml/m  RA Volume:   59.40 ml  35.16 ml/m LA Vol (A4C):   75.7 ml  44.81 ml/m LA Biplane Vol: 101.0 ml 59.78 ml/m  AORTIC VALVE AV Area (Vmax):    2.01 cm AV Area (Vmean):   1.76 cm AV Area (VTI):     2.25 cm AV Vmax:           107.50 cm/s AV Vmean:          77.200 cm/s AV VTI:            0.204 m AV Peak Grad:      4.6 mmHg AV Mean Grad:  2.5 mmHg LVOT Vmax:         68.70 cm/s LVOT Vmean:        43.300 cm/s LVOT VTI:          0.146 m LVOT/AV VTI ratio: 0.72 AI PHT:            723 msec  AORTA Ao Root diam: 3.10 cm MITRAL VALVE                TRICUSPID VALVE MV Area (PHT): 4.93 cm     TR Peak grad:   22.8 mmHg MV Decel Time: 154 msec     TR Vmax:        239.00 cm/s MV E velocity: 127.00 cm/s                             SHUNTS                             Systemic VTI:  0.15 m                             Systemic Diam: 2.00 cm Julien Nordmann MD Electronically signed by  Julien Nordmann MD Signature Date/Time: 05/09/2023/11:12:19 AM    Final    CT ANGIO HEAD NECK W WO CM Result Date: 05/08/2023 CLINICAL DATA:  Dizziness EXAM: CT ANGIOGRAPHY HEAD AND NECK WITH AND WITHOUT CONTRAST TECHNIQUE: Multidetector CT imaging of the head and neck was performed using the standard protocol during bolus administration of intravenous contrast. Multiplanar CT image reconstructions and MIPs were obtained to evaluate the vascular anatomy. Carotid stenosis measurements (when applicable) are obtained utilizing NASCET criteria, using the distal internal carotid diameter as the denominator. RADIATION DOSE REDUCTION: This exam was performed according to the departmental dose-optimization program which includes automated exposure control, adjustment of the mA and/or kV according to patient size and/or use of iterative reconstruction technique. CONTRAST:  75mL OMNIPAQUE IOHEXOL 350 MG/ML SOLN COMPARISON:  05/08/2023 head CT FINDINGS: CT HEAD FINDINGS Brain: There is no mass, hemorrhage or extra-axial collection. The size and configuration of the ventricles and extra-axial CSF spaces are normal. There is hypoattenuation of the white matter, most commonly indicating chronic small vessel disease. Vascular: No hyperdense vessel or unexpected vascular calcification. Skull: The visualized skull base, calvarium and extracranial soft tissues are normal. Sinuses/Orbits: No fluid levels or advanced mucosal thickening of the visualized paranasal sinuses. No mastoid or middle ear effusion. Normal orbits. CTA NECK FINDINGS Skeleton: No acute abnormality or high grade bony spinal canal stenosis. Other neck: Normal pharynx, larynx and major salivary glands. No cervical lymphadenopathy. Unremarkable thyroid gland. Upper chest: No pneumothorax or pleural effusion. No nodules or masses. Aortic arch: There is calcific atherosclerosis of the aortic arch. Conventional 3 vessel aortic branching pattern. RIGHT carotid system: No  dissection, occlusion or aneurysm. There is bulky calcific atherosclerosis extending into the proximal ICA, resulting in approximately 60% stenosis. LEFT carotid system: No dissection, occlusion or aneurysm. There is mixed density atherosclerosis extending into the proximal ICA, resulting in less than 50% stenosis. Vertebral arteries: Right dominant configuration. There is no dissection, occlusion or flow-limiting stenosis to the skull base (V1-V3 segments). CTA HEAD FINDINGS POSTERIOR CIRCULATION: Multifocal severe stenosis of the left V4 segment. Normal right V4 segment. No proximal occlusion of the anterior or inferior cerebellar arteries. Basilar artery is normal. Superior cerebellar  arteries are normal. Posterior cerebral arteries are normal. ANTERIOR CIRCULATION: Atherosclerotic calcification of the internal carotid arteries at the skull base without hemodynamically significant stenosis. Anterior cerebral arteries are normal. Middle cerebral arteries are normal. Venous sinuses: As permitted by contrast timing, patent. Anatomic variants: None Review of the MIP images confirms the above findings. IMPRESSION: 1. No emergent large vessel occlusion. 2. 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 3. Approximately 60% stenosis of the proximal right ICA. Aortic atherosclerosis (ICD10-I70.0). Electronically Signed   By: Deatra Robinson M.D.   On: 05/08/2023 17:41   CT Head Wo Contrast Result Date: 05/08/2023 CLINICAL DATA:  89 year old female with vertigo, neurologic deficit, unable to stand, last known well 2330 hours. EXAM: CT HEAD WITHOUT CONTRAST TECHNIQUE: Contiguous axial images were obtained from the base of the skull through the vertex without intravenous contrast. RADIATION DOSE REDUCTION: This exam was performed according to the departmental dose-optimization program which includes automated exposure control, adjustment of the mA and/or kV according to patient size and/or use of  iterative reconstruction technique. COMPARISON:  Head CT 07/04/2022. FINDINGS: Brain: Stable cerebral volume. No midline shift, ventriculomegaly, mass effect, evidence of mass lesion, intracranial hemorrhage or evidence of cortically based acute infarction. Gray-white differentiation is stable and within normal limits for age. Vascular: Calcified atherosclerosis at the skull base. No suspicious intracranial vascular hyperdensity. Skull: Osteopenia.  Stable visualized osseous structures. Sinuses/Orbits: Visualized tympanic cavities, paranasal sinuses and mastoids are clear. Other: No acute orbit or scalp soft tissue finding. IMPRESSION: Stable since last year and negative for age noncontrast CT appearance of the Head. Electronically Signed   By: Odessa Fleming M.D.   On: 05/08/2023 10:39   CUP PACEART INCLINIC DEVICE CHECK Result Date: 04/13/2023 Normal in-clinic Pacemaker check. Threshold, sensing, and impedance WNL. No episodes. Estimated longevity __3 years__. Pt enrolled in remote follow-up. Percell Belt, NP     Subjective: Seen and examined at the time of discharge.  Stable no distress.  Appropriate for discharge home.  Discharge Exam: Vitals:   05/09/23 0449 05/09/23 0751  BP: 128/61 134/65  Pulse: 73 74  Resp: 18 16  Temp: 98.1 F (36.7 C) (!) 97.5 F (36.4 C)  SpO2: 100% 100%   Vitals:   05/08/23 2053 05/09/23 0144 05/09/23 0449 05/09/23 0751  BP: (!) 138/56 131/64 128/61 134/65  Pulse: 69 71 73 74  Resp:   18 16  Temp: 97.9 F (36.6 C) 97.7 F (36.5 C) 98.1 F (36.7 C) (!) 97.5 F (36.4 C)  TempSrc:   Oral   SpO2: 99% 99% 100% 100%  Weight:      Height:        General: Pt is alert, awake, not in acute distress Cardiovascular: RRR, S1/S2 +, no rubs, no gallops Respiratory: CTA bilaterally, no wheezing, no rhonchi Abdominal: Soft, NT, ND, bowel sounds + Extremities: no edema, no cyanosis    The results of significant diagnostics from this hospitalization (including imaging,  microbiology, ancillary and laboratory) are listed below for reference.     Microbiology: No results found for this or any previous visit (from the past 240 hours).   Labs: BNP (last 3 results) No results for input(s): "BNP" in the last 8760 hours. Basic Metabolic Panel: Recent Labs  Lab 05/08/23 0935 05/09/23 0446  NA 143 143  K 3.8 4.5  CL 109 113*  CO2 24 24  GLUCOSE 119* 100*  BUN 38* 29*  CREATININE 1.04* 0.93  CALCIUM 9.5 9.4   Liver  Function Tests: Recent Labs  Lab 05/09/23 0446  AST 13*  ALT 14  ALKPHOS 41  BILITOT 0.8  PROT 5.8*  ALBUMIN 3.6   No results for input(s): "LIPASE", "AMYLASE" in the last 168 hours. No results for input(s): "AMMONIA" in the last 168 hours. CBC: Recent Labs  Lab 05/08/23 0935 05/09/23 0446  WBC 4.9 7.8  HGB 13.0 12.0  HCT 37.6 33.9*  MCV 94.9 92.6  PLT 150 158   Cardiac Enzymes: Recent Labs  Lab 05/08/23 1517  CKTOTAL 65   BNP: Invalid input(s): "POCBNP" CBG: Recent Labs  Lab 05/08/23 2055 05/09/23 0447 05/09/23 0754  GLUCAP 191* 101* 75   D-Dimer No results for input(s): "DDIMER" in the last 72 hours. Hgb A1c No results for input(s): "HGBA1C" in the last 72 hours. Lipid Profile No results for input(s): "CHOL", "HDL", "LDLCALC", "TRIG", "CHOLHDL", "LDLDIRECT" in the last 72 hours. Thyroid function studies No results for input(s): "TSH", "T4TOTAL", "T3FREE", "THYROIDAB" in the last 72 hours.  Invalid input(s): "FREET3" Anemia work up No results for input(s): "VITAMINB12", "FOLATE", "FERRITIN", "TIBC", "IRON", "RETICCTPCT" in the last 72 hours. Urinalysis    Component Value Date/Time   COLORURINE YELLOW (A) 05/08/2023 1112   APPEARANCEUR CLEAR (A) 05/08/2023 1112   APPEARANCEUR Clear 09/28/2012 1555   LABSPEC 1.015 05/08/2023 1112   LABSPEC 1.003 09/28/2012 1555   PHURINE 6.0 05/08/2023 1112   GLUCOSEU NEGATIVE 05/08/2023 1112   GLUCOSEU Negative 09/28/2012 1555   HGBUR NEGATIVE 05/08/2023 1112    BILIRUBINUR NEGATIVE 05/08/2023 1112   BILIRUBINUR 1+ 02/06/2022 1223   BILIRUBINUR Negative 09/28/2012 1555   KETONESUR 5 (A) 05/08/2023 1112   PROTEINUR NEGATIVE 05/08/2023 1112   UROBILINOGEN 0.2 02/06/2022 1223   NITRITE NEGATIVE 05/08/2023 1112   LEUKOCYTESUR NEGATIVE 05/08/2023 1112   LEUKOCYTESUR Negative 09/28/2012 1555   Sepsis Labs Recent Labs  Lab 05/08/23 0935 05/09/23 0446  WBC 4.9 7.8   Microbiology No results found for this or any previous visit (from the past 240 hours).   Time coordinating discharge: Over 30 minutes  SIGNED:   Tresa Moore, MD  [facility] 05/09/2023, 2:41 PM Pager   If 7PM-7AM, please contact night-coverage

## 2023-05-09 NOTE — Plan of Care (Signed)
  Problem: Physical Regulation: Goal: Complications related to the disease process, condition or treatment will be avoided or minimized Outcome: Progressing   Problem: Pain Managment: Goal: General experience of comfort will improve and/or be controlled Outcome: Progressing

## 2023-05-09 NOTE — Plan of Care (Signed)

## 2023-05-09 NOTE — Evaluation (Signed)
 Occupational Therapy Evaluation Patient Details Name: JADASIA HAWS MRN: [id number] DOB: [birth year] Today's Date: 05/09/2023   History of Present Illness   Pt is an 89 y/o F admitted on 05/08/23 after presenting with c/o dizziness & presyncope. Pt admitted for further work-up. PMH: a-fib, HFpEF, heart block s/p PPM, anxiety, CAD, CHF, GI bleed, hiatal hernia, meniere's disease (1970s), raynaud's disease, R ear sensorineural hearing loss, tinnitus, ulcerative colitis, urinary incontinence     Clinical Impressions Patient presenting with decreased Ind in self care,balance, functional mobility/transfers, endurance, and safety awareness. Patient reports living at home with husband and being Ind at baseline with self care needs. Pt is very active outside as well. Patient currently functioning at supervision - min guard with use of RW for grooming tasks standing and then toileting needs. Patient will benefit from acute OT to increase overall independence in the areas of ADLs, functional mobility, and safety awareness in order to safely discharge.     If plan is discharge home, recommend the following:   A little help with walking and/or transfers;A little help with bathing/dressing/bathroom;Assistance with cooking/housework;Assist for transportation;Help with stairs or ramp for entrance     Functional Status Assessment   Patient has had a recent decline in their functional status and demonstrates the ability to make significant improvements in function in a reasonable and predictable amount of time.     Equipment Recommendations   Other (comment) (RW)      Precautions/Restrictions   Precautions Precautions: Fall     Mobility Bed Mobility Overal bed mobility: Needs Assistance Bed Mobility: Supine to Sit     Supine to sit: Supervision, HOB elevated, Used rails          Transfers Overall transfer level: Needs assistance Equipment used: None Transfers: Sit to/from Stand,  Bed to chair/wheelchair/BSC Sit to Stand: Contact guard assist     Step pivot transfers: Contact guard assist            Balance Overall balance assessment: Needs assistance Sitting-balance support: Feet supported Sitting balance-Leahy Scale: Fair Sitting balance - Comments: supervision static sitting   Standing balance support: During functional activity, Single extremity supported Standing balance-Leahy Scale: Poor                             ADL either performed or assessed with clinical judgement   ADL Overall ADL's : Needs assistance/impaired     Grooming: Wash/dry hands;Wash/dry face;Oral care;Standing;Supervision/safety                   Toilet Transfer: Contact guard assist;Rolling walker (2 wheels);Regular Toilet   Toileting- Clothing Manipulation and Hygiene: Contact guard assist;Sit to/from stand               Vision Patient Visual Report: No change from baseline              Pertinent Vitals/Pain Pain Assessment Pain Assessment: Faces Faces Pain Scale: No hurt Pain Descriptors / Indicators: Discomfort Pain Intervention(s): Monitored during session, Repositioned     Extremity/Trunk Assessment Upper Extremity Assessment Upper Extremity Assessment: Overall WFL for tasks assessed;Generalized weakness   Lower Extremity Assessment Lower Extremity Assessment: Overall WFL for tasks assessed;Generalized weakness       Communication Communication Communication: No apparent difficulties   Cognition Arousal: Alert Behavior During Therapy: WFL for tasks assessed/performed Cognition: History of cognitive impairments  Following commands: Intact       Cueing  General Comments   Cueing Techniques: Verbal cues;Tactile cues              Home Living Family/patient expects to be discharged to:: Private residence Living Arrangements: Spouse/significant other Available Help at  Discharge: Family;Available PRN/intermittently Type of Home: House Home Access: Stairs to enter [employer] of Steps: 2 Entrance Stairs-Rails: None Home Layout: One level     Bathroom Shower/Tub: Walk-in shower         Home Equipment: Gilmer Mor - single point;Shower seat - built in          Prior Functioning/Environment               Mobility Comments: ambulatory without AD, walks in the woods & picks up limbs, notes 2 falls in the past 6 months, no longer drives ADLs Comments: bathes & dresses without assistance, husband does cooking & cleaning    OT Problem List: Decreased strength;Decreased activity tolerance;Decreased safety awareness;Impaired balance (sitting and/or standing);Decreased knowledge of use of DME or AE;Decreased cognition   OT Treatment/Interventions: Self-care/ADL training;DME and/or AE instruction;Energy conservation;Therapeutic exercise;Therapeutic activities      OT Goals(Current goals can be found in the care plan section)   Acute Rehab OT Goals Patient Stated Goal: to go home OT Goal Formulation: With patient/family Time For Goal Achievement: 05/23/23 Potential to Achieve Goals: Fair ADL Goals Pt Will Perform Grooming: with modified independence;standing Pt Will Perform Lower Body Dressing: with modified independence;sit to/from stand Pt Will Transfer to Toilet: with modified independence;ambulating Pt Will Perform Toileting - Clothing Manipulation and hygiene: with modified independence;sit to/from stand   OT Frequency:  Min 1X/week       AM-PAC OT "6 Clicks" Daily Activity     Outcome Measure Help from another person eating meals?: None Help from another person taking care of personal grooming?: None Help from another person toileting, which includes using toliet, bedpan, or urinal?: A Little Help from another person bathing (including washing, rinsing, drying)?: A Little Help from another person to put on and taking off  regular upper body clothing?: None Help from another person to put on and taking off regular lower body clothing?: A Little 6 Click Score: 21   End of Session Equipment Utilized During Treatment: Rolling walker (2 wheels) Nurse Communication: Mobility status  Activity Tolerance: Patient tolerated treatment well Patient left: with call bell/phone within reach;with bed alarm set;with family/visitor present  OT Visit Diagnosis: Unsteadiness on feet (R26.81);Repeated falls (R29.6);Muscle weakness (generalized) (M62.81)                Time: 0347-4259 OT Time Calculation (min): 22 min Charges:  OT General Charges $OT Visit: 1 Visit OT Evaluation $OT Eval Low Complexity: 1 Low OT Treatments $Self Care/Home Management : 8-22 mins  Jackquline Denmark, MS, OTR/L , CBIS ascom [phone]  05/09/23, 12:21 PM

## 2023-05-10 ENCOUNTER — Telehealth: Payer: Self-pay

## 2023-05-10 NOTE — Transitions of Care (Post Inpatient/ED Visit) (Signed)
   05/10/2023  Name: Sierra Kaiser MRN: [id number] DOB: May 08, 1933  Today's TOC FU Call Status: Today's TOC FU Call Status:: Successful TOC FU Call Completed TOC FU Call Complete Date: 05/10/23 Patient's Name and Date of Birth confirmed.  Transition Care Management Follow-up Telephone Call Date of Discharge: 05/09/23 Discharge Facility: [facility]) Type of Discharge: Emergency Department Reason for ED Visit: Other: (dizziness) How have you been since you were released from the hospital?: Same Any questions or concerns?: No  Items Reviewed: Did you receive and understand the discharge instructions provided?: Yes Medications obtained,verified, and reconciled?: Yes (Medications Reviewed) Any new allergies since your discharge?: No Dietary orders reviewed?: Yes  Medications Reviewed Today: Medications Reviewed Today     Reviewed by Karena Addison, LPN (Licensed Practical Nurse) on 05/10/23 at 1429  Med List Status: <None>   Medication Order Taking? Sig Documenting Provider Last Dose Status Informant  Cholecalciferol (VITAMIN D3) 25 MCG (1000 UT) CAPS [id number] No Take 2 capsules (2,000 Units total) by mouth daily. Eustaquio Boyden, MD Taking Active   cyanocobalamin (,VITAMIN B-12,) 1000 MCG/ML injection [id number] No Inject 1 mL (1,000 mcg total) into the muscle every 30 (thirty) days. Eustaquio Boyden, MD Taking Active   ezetimibe (ZETIA) 10 MG tablet [id number]  Take 1 tablet (10 mg total) by mouth daily. Eustaquio Boyden, MD  Active   fluticasone [facility]) 50 MCG/ACT nasal spray [id number]  Place 2 sprays into both nostrils daily. Eustaquio Boyden, MD  Active   furosemide (LASIX) 20 MG tablet [id number] No TAKE 1 TABLET (20 MG TOTAL) BY MOUTH AS NEEDED FOR FLUID OR EDEMA. Eustaquio Boyden, MD Taking Active   meclizine (ANTIVERT) 25 MG tablet [id number]  Take 1 tablet (25 mg total) by mouth 3 (three) times daily as needed for dizziness. Tresa Moore, MD  Active   ondansetron (ZOFRAN) 4 MG tablet [id number]  Take 1 tablet (4 mg total) by mouth every 6 (six) hours as needed for nausea. Tresa Moore, MD  Active   Polyethyl Glycol-Propyl Glycol (SYSTANE OP) [id number] No Place 1 drop into both eyes daily as needed (dry eyes). [provider] Taking Active Self  Rivaroxaban (XARELTO) 15 MG TABS tablet [id number] No Take 1 tablet (15 mg total) by mouth daily with supper. Antonieta Iba, MD Taking Active   rosuvastatin (CRESTOR) 20 MG tablet [id number]  Take 1 tablet (20 mg total) by mouth every other day. Eustaquio Boyden, MD  Active             Home Care and Equipment/Supplies: Were Home Health Services Ordered?: NA Any new equipment or medical supplies ordered?: NA  Functional Questionnaire: Do you need assistance with bathing/showering or dressing?: Yes Do you need assistance with meal preparation?: Yes Do you need assistance with eating?: No Do you have difficulty maintaining continence: No Do you need assistance with getting out of bed/getting out of a chair/moving?: No Do you have difficulty managing or taking your medications?: No  Follow up appointments reviewed: PCP Follow-up appointment confirmed?: No (declined appt) MD Provider Line [phone] Given: No Specialist Hospital Follow-up appointment confirmed?: NA Do you need transportation to your follow-up appointment?: No Do you understand care options if your condition(s) worsen?: Yes-patient verbalized understanding    SIGNATURE Karena Addison, LPN [facility] Nurse Health Advisor Direct Dial [phone]

## 2023-05-12 LAB — CUP PACEART REMOTE DEVICE CHECK
Battery Remaining Longevity: 36 mo
Battery Remaining Percentage: 28 %
Battery Voltage: 2.92 V
Brady Statistic RV Percent Paced: 99 %
Date Time Interrogation Session: 20250306015044
Implantable Lead Connection Status: 753985
Implantable Lead Implant Date: 20160217
Implantable Lead Location: 753860
Implantable Lead Model: 1948
Implantable Pulse Generator Implant Date: 20160217
Lead Channel Impedance Value: 460 Ohm
Lead Channel Pacing Threshold Amplitude: 0.875 V
Lead Channel Pacing Threshold Pulse Width: 0.4 ms
Lead Channel Sensing Intrinsic Amplitude: 7.8 mV
Lead Channel Setting Pacing Amplitude: 1.125
Lead Channel Setting Pacing Pulse Width: 0.4 ms
Lead Channel Setting Sensing Sensitivity: 2.5 mV
Pulse Gen Model: 2240
Pulse Gen Serial Number: 3050195

## 2023-05-21 ENCOUNTER — Telehealth: Payer: Self-pay | Admitting: Family Medicine

## 2023-05-21 NOTE — Telephone Encounter (Signed)
 Fyi, to Dr Reece Agar.

## 2023-05-21 NOTE — Telephone Encounter (Signed)
 Noted.
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
## 2023-05-22 ENCOUNTER — Encounter: Payer: Self-pay | Admitting: Family Medicine

## 2023-05-22 ENCOUNTER — Ambulatory Visit: Admitting: Family Medicine

## 2023-05-22 ENCOUNTER — Inpatient Hospital Stay: Admitting: Family

## 2023-05-22 VITALS — BP 116/64 | HR 71 | Temp 98.2°F | Ht 66.0 in | Wt 133.1 lb

## 2023-05-22 DIAGNOSIS — E538 Deficiency of other specified B group vitamins: Secondary | ICD-10-CM

## 2023-05-22 DIAGNOSIS — I6521 Occlusion and stenosis of right carotid artery: Secondary | ICD-10-CM | POA: Diagnosis not present

## 2023-05-22 DIAGNOSIS — Z950 Presence of cardiac pacemaker: Secondary | ICD-10-CM

## 2023-05-22 DIAGNOSIS — I4821 Permanent atrial fibrillation: Secondary | ICD-10-CM | POA: Diagnosis not present

## 2023-05-22 DIAGNOSIS — F419 Anxiety disorder, unspecified: Secondary | ICD-10-CM | POA: Diagnosis not present

## 2023-05-22 DIAGNOSIS — G3184 Mild cognitive impairment, so stated: Secondary | ICD-10-CM | POA: Diagnosis not present

## 2023-05-22 DIAGNOSIS — H8109 Meniere's disease, unspecified ear: Secondary | ICD-10-CM

## 2023-05-22 DIAGNOSIS — Z638 Other specified problems related to primary support group: Secondary | ICD-10-CM | POA: Diagnosis not present

## 2023-05-22 DIAGNOSIS — R42 Dizziness and giddiness: Secondary | ICD-10-CM | POA: Diagnosis not present

## 2023-05-22 DIAGNOSIS — R451 Restlessness and agitation: Secondary | ICD-10-CM | POA: Diagnosis not present

## 2023-05-22 MED ORDER — CYANOCOBALAMIN 1000 MCG/ML IJ SOLN
1000.0000 ug | Freq: Once | INTRAMUSCULAR | Status: AC
Start: 2023-05-22 — End: 2023-05-22
  Administered 2023-05-22: 1000 ug via INTRAMUSCULAR

## 2023-05-22 MED ORDER — MECLIZINE HCL 25 MG PO TABS: 12.5000 mg | ORAL_TABLET | Freq: Two times a day (BID) | ORAL | 0 refills | Status: AC | PRN

## 2023-05-22 MED ORDER — DONEPEZIL HCL 5 MG PO TABS: 5.0000 mg | ORAL_TABLET | Freq: Every day | ORAL | 3 refills | Status: DC

## 2023-05-22 NOTE — Patient Instructions (Addendum)
 Vitamin B12 shot today, continue monthly B12 shots.  I will ask home health to reach your husband Bethann Berkshire at [phone] or sister [name] at [phone].  Start aricept 5mg  nightly for memory. Return in 1 month for follow up.  We will refer you back to neurologist Dr Sherryll Burger.  Good to see you today

## 2023-05-22 NOTE — Progress Notes (Unsigned)
 Ph: [phone] Fax: [phone]   Patient ID: Sierra Kaiser, female    DOB: 10/23/1933, 89 y.o.   MRN: [id number]  This visit was conducted in person.  BP 116/64   Pulse 71   Temp 98.2 F (36.8 C) (Oral)   Ht 5\' 6"  (1.676 m)   Wt 133 lb 2 oz (60.4 kg)   SpO2 94%   BMI 21.49 kg/m    CC: hosp f/u visit "I think I have vertigo" Subjective:   HPI: Sierra Kaiser is a 89 y.o. female presenting on 05/22/2023 for Hospitalization Follow-up (Admitted on 05/08/23 at [facility], dx syncope/collapse; dizziness. Pt accompanied by sister, [name]. )   No longer driving - sister drove her here.  She continues using life alert.   Significant family stressors reviewed - relationship with husband who is losing his sight, she has 4 boys and has 1 step-daughter - poor relationship with step-daughter.   H/o afib, HFpEF, heart block s/p PPM sees afib clinic last seen 04/2023 on xarelto 15mg  daily.  Recent hospitalization for dizziness, reproduced with position changes (sitting to standing).  Hospital records reviewed. Med rec performed. Head CT and CT angio head/neck reassuringly without acute finding but she did have multifocal severe stenosis of L vertebral artery V4 segment and 60% stenosed R ICA.  MRI not done at [facility] due to pacemaker status. Pacemaker interrogated - no correlation between pacemaker and syncopal event.  Symptoms attributed to dehydration.  Rx meclizine PRN.  She continues crestor 20mg  every other day and zetia 10mg  daily with overall good cholesterol control: Lab Results  Component Value Date   CHOL 132 04/17/2023   HDL 60.90 04/17/2023   LDLCALC 54 04/17/2023   TRIG 86.0 04/17/2023   CHOLHDL 2 04/17/2023     Orthostatics during hospitalization: Semi fowler: 129/90 (101) HR 72 Sitting EOB: 129/72 (86) HR 75 - pt c/o dizziness Standing at 0: 143/91 (109) HR 72 Standing at 2: 129/63 (83) HR 72 - pt with c/o worsening dizziness Sitting in recliner: 147/69 (87) HR 75     Describes dizziness as "head feels heavy", imbalance unsteadiness "drunk feel". No presyncope or vertigo.   Sister also brought up concern over increased aggression noted by family.   Last b12 shot was 04/17/2023. Will update today.  Lab Results  Component Value Date   VITAMINB12 334 04/17/2023   Last saw neurology Dr Sherryll Burger 05/2020. She did not take prescribed donepezil 5mg  nightly. Last visit we stopped oxybutynin. Ongoing urinary urge incontinence. She continues using 5-6 pads/day.  Previously trial of sertraline caused tremors.   Geriatric Assessment: Activities of Daily Living:     Bathing-independent    Dressing-independent    Eating-independent    Toileting-independent    Transferring-independent    Continence-independent Overall Assessment:independent  Instrumental Activities of Daily Living:     Transportation-DEPENDENT     Meal/Food Preparation-DEPENDENT     Shopping Errands-DEPENDENT     Housekeeping/Chores-partially dependent     Money Management/Finances-partially dependent     Medication Management-independent    Ability to Use Telephone-DEPENDENT     Laundry-partially dependent  Overall Assessment: partially dependent   Mental Status Exam: 25/30 (value/max value)     Clock Drawing Score: 4/4  Home health recommended - [facility] - has not been called to schedule appt. - I will ask my referral coordinators to check on this.   Other follow up appointments scheduled: none ______________________________________________________________________ Hospital admission: 05/08/2023 Hospital discharge: 05/09/2023 TCM f/u phone call:  performed  on 05/10/2023  Recommendations for Outpatient Follow-up:  Follow up with PCP in 1-2 weeks  Discharge Diagnoses:  Principal Problem:   Dizziness Active Problems:   Chronic diastolic CHF (congestive heart failure) (HCC)   Atrial fibrillation, permanent (HCC)   Complete heart block (HCC)   CKD (chronic kidney disease) stage 3, GFR  30-59 ml/min (HCC)   S/P placement of cardiac pacemaker  Home Health:Yes PT OT Equipment/Devices:None    Discharge Condition:Stable  CODE STATUS:DNR  Diet recommendation: REG     Relevant past medical, surgical, family and social history reviewed and updated as indicated. Interim medical history since our last visit reviewed. Allergies and medications reviewed and updated. Outpatient Medications Prior to Visit  Medication Sig Dispense Refill   Cholecalciferol (VITAMIN D3) 25 MCG (1000 UT) CAPS Take 2 capsules (2,000 Units total) by mouth daily.     cyanocobalamin (,VITAMIN B-12,) 1000 MCG/ML injection Inject 1 mL (1,000 mcg total) into the muscle every 30 (thirty) days.     ezetimibe (ZETIA) 10 MG tablet Take 1 tablet (10 mg total) by mouth daily. 90 tablet 4   fluticasone (FLONASE) 50 MCG/ACT nasal spray Place 2 sprays into both nostrils daily. 48 mL 4   furosemide (LASIX) 20 MG tablet TAKE 1 TABLET (20 MG TOTAL) BY MOUTH AS NEEDED FOR FLUID OR EDEMA. 30 tablet 3   ondansetron (ZOFRAN) 4 MG tablet Take 1 tablet (4 mg total) by mouth every 6 (six) hours as needed for nausea. 20 tablet 0   Polyethyl Glycol-Propyl Glycol (SYSTANE OP) Place 1 drop into both eyes daily as needed (dry eyes).     Rivaroxaban (XARELTO) 15 MG TABS tablet Take 1 tablet (15 mg total) by mouth daily with supper. 30 tablet 1   rosuvastatin (CRESTOR) 20 MG tablet Take 1 tablet (20 mg total) by mouth every other day. 45 tablet 4   meclizine (ANTIVERT) 25 MG tablet Take 1 tablet (25 mg total) by mouth 3 (three) times daily as needed for dizziness. 30 tablet 0   No facility-administered medications prior to visit.     Per HPI unless specifically indicated in ROS section below Review of Systems  Objective:  BP 116/64   Pulse 71   Temp 98.2 F (36.8 C) (Oral)   Ht 5\' 6"  (1.676 m)   Wt 133 lb 2 oz (60.4 kg)   SpO2 94%   BMI 21.49 kg/m   Wt Readings from Last 3 Encounters:  05/22/23 133 lb 2 oz (60.4 kg)   05/08/23 134 lb 7.7 oz (61 kg)  04/17/23 131 lb 6 oz (59.6 kg)      Physical Exam Vitals and nursing note reviewed.  Constitutional:      Appearance: Normal appearance. She is not ill-appearing.  HENT:     Head: Normocephalic and atraumatic.     Mouth/Throat:     Mouth: Mucous membranes are moist.     Pharynx: Oropharynx is clear. No oropharyngeal exudate or posterior oropharyngeal erythema.  Eyes:     Extraocular Movements: Extraocular movements intact.     Pupils: Pupils are equal, round, and reactive to light.  Cardiovascular:     Rate and Rhythm: Normal rate and regular rhythm.     Pulses: Normal pulses.     Heart sounds: Normal heart sounds. No murmur heard. Pulmonary:     Effort: Pulmonary effort is normal. No respiratory distress.     Breath sounds: Normal breath sounds. No wheezing, rhonchi or rales.  Musculoskeletal:  Cervical back: Normal range of motion and neck supple.     Right lower leg: No edema.     Left lower leg: No edema.  Lymphadenopathy:     Cervical: No cervical adenopathy.  Skin:    General: Skin is warm and dry.     Findings: No rash.  Neurological:     Mental Status: She is alert.  Psychiatric:        Mood and Affect: Mood normal.        Behavior: Behavior normal.    Lab Results  Component Value Date   NA 143 05/09/2023   CL 113 (H) 05/09/2023   K 4.5 05/09/2023   CO2 24 05/09/2023   BUN 29 (H) 05/09/2023   CREATININE 0.93 05/09/2023   GFRNONAA 59 (L) 05/09/2023   CALCIUM 9.4 05/09/2023   PHOS 3.6 04/17/2023   ALBUMIN 3.6 05/09/2023   GLUCOSE 100 (H) 05/09/2023    Lab Results  Component Value Date   WBC 7.8 05/09/2023   HGB 12.0 05/09/2023   HCT 33.9 (L) 05/09/2023   MCV 92.6 05/09/2023   PLT 158 05/09/2023         03/19/2023    2:04 PM 08/14/2022   10:21 AM 07/10/2022   12:10 PM 04/10/2022   11:50 AM 05/03/2021   11:41 AM  Depression screen PHQ 2/9  Decreased Interest 0 0 0 2 0  Down, Depressed, Hopeless 0 0 0 0 0  PHQ -  2 Score 0 0 0 2 0  Altered sleeping   0 2   Tired, decreased energy   1 2   Change in appetite   2 1   Feeling bad or failure about yourself    0 0   Trouble concentrating   0 1   Moving slowly or fidgety/restless   0 2   Suicidal thoughts   0 0   PHQ-9 Score   3 10   Difficult doing work/chores   Somewhat difficult Very difficult        08/14/2022   10:21 AM 07/10/2022   12:10 PM 04/10/2022   11:51 AM 11/02/2020   10:11 AM  GAD 7 : Generalized Anxiety Score  Nervous, Anxious, on Edge 0 1 0 2  Control/stop worrying 0 1 0 1  Worry too much - different things 0 1 0 1  Trouble relaxing 0 0 0 0  Restless 0 0 0 0  Easily annoyed or irritable 0 1 1 2   Afraid - awful might happen 0 0 0 0  Total GAD 7 Score 0 4 1 6   Anxiety Difficulty  Not difficult at all     Assessment & Plan:   Problem List Items Addressed This Visit     Atrial fibrillation, permanent (HCC)   Continues xarelto 15mg  daily.       Meniere's disease   Carries this diagnosis, has not been confirmed by ENT Current dizzy symptoms are not consistent with vertigo.       Low serum vitamin B12   B12 shot today then continue monthly shots She has not noted significant improvement with B12 replacement.       Anxiety with agitation   Chronic issue, associated with agitation/aggression.  Previous poor effect to antidepressants tried (sertraline, lexapro).  Resart aricept 5mg  nightly.  Consider retrial mood medication.       Relevant Orders   Ambulatory referral to Neurology   Dizziness   Not true vertigo or presyncope but rather imbalance/unsteadiness.  ?  orthostatic dizziness based on hospital orthostatic vitals (diastolic drops from 90 supine to 63 standing).  She is only on lasix PRN.  Recent CTA head/neck showing significant stenosis of L vertebral artery and 60% stenosis of RICA. No obvious stroke symptoms or stroke history. She is already on crestor and zetia with optimal cholesterol control.  She has upcoming  ENT appt for vestibular evaluation - she will keep that.  Will also refer back to neurology for memory and imbalance evaluation - she is not ataxic. ?neurodegenerative disease process.       Relevant Orders   Ambulatory referral to Neurology   Ambulatory referral to Home Health   S/P placement of cardiac pacemaker   Mild cognitive impairment with memory loss - Primary   Discussed family stressors contributing  Reviewed 4 pillars to support a healthy mind. She endorses self-imposed isolation, poor social support - doesn't want to be around people. Sister notes concern over increased aggression/agitation.  Recommend retry aricept start at 5mg  nightly with option to increase, anticipate safe in pacemaker use. Recommend return to neurology for memory f/u - referral placed.  Consider antidepressant trial pending effect of above.       Relevant Orders   Ambulatory referral to Neurology   Ambulatory referral to Home Health   Stress due to family tension   Discussed family stressors.  Suggested consider couples counseling if pt and husband willing      Carotid stenosis   Head/neck CTA 05/2023 - multifocal severe stenosis of L vertebral artery V4 segment and 60% stenosed R ICA Already on rosuvastatin and zetia and xarelto 15 mg daily.       Relevant Orders   Ambulatory referral to Neurology     Meds ordered this encounter  Medications   donepezil (ARICEPT) 5 MG tablet    Sig: Take 1 tablet (5 mg total) by mouth at bedtime.    Dispense:  30 tablet    Refill:  3   meclizine (ANTIVERT) 25 MG tablet    Sig: Take 0.5-1 tablets (12.5-25 mg total) by mouth 2 (two) times daily as needed for dizziness.    Dispense:  30 tablet    Refill:  0   cyanocobalamin (VITAMIN B12) injection 1,000 mcg    Orders Placed This Encounter  Procedures   Ambulatory referral to Neurology    Referral Priority:   Routine    Referral Type:   Consultation    Referral Reason:   Specialty Services Required     Requested Specialty:   Neurology    Number of Visits Requested:   1   Ambulatory referral to Home Health    Referral Priority:   Routine    Referral Type:   Home Health Care    Referral Reason:   Specialty Services Required    Requested Specialty:   Home Health Services    Number of Visits Requested:   1    Patient Instructions  Vitamin B12 shot today, continue monthly B12 shots.  I will ask home health to reach your husband Bethann Berkshire at [phone] or sister [name] at [phone].  Start aricept 5mg  nightly for memory. Return in 1 month for follow up.  We will refer you back to neurologist Dr Sherryll Burger.  Good to see you today   Follow up plan: Return in about 1 month (around 06/22/2023) for follow up visit.  Eustaquio Boyden, MD

## 2023-05-23 ENCOUNTER — Encounter: Payer: Self-pay | Admitting: Family Medicine

## 2023-05-23 DIAGNOSIS — I6529 Occlusion and stenosis of unspecified carotid artery: Secondary | ICD-10-CM | POA: Insufficient documentation

## 2023-05-23 DIAGNOSIS — Z638 Other specified problems related to primary support group: Secondary | ICD-10-CM | POA: Insufficient documentation

## 2023-05-23 NOTE — Assessment & Plan Note (Signed)
 Continues xarelto 15mg  daily.

## 2023-05-23 NOTE — Assessment & Plan Note (Addendum)
 B12 shot today then continue monthly shots She has not noted significant improvement with B12 replacement.

## 2023-05-23 NOTE — Assessment & Plan Note (Addendum)
 Discussed family stressors contributing  Reviewed 4 pillars to support a healthy mind. She endorses self-imposed isolation, poor social support - doesn't want to be around people. Sister notes concern over increased aggression/agitation.  Recommend retry aricept start at 5mg  nightly with option to increase, anticipate safe in pacemaker use. Recommend return to neurology for memory f/u - referral placed.  Consider antidepressant trial pending effect of above.

## 2023-05-23 NOTE — Assessment & Plan Note (Addendum)
 Discussed family stressors.  Suggested consider couples counseling if pt and husband willing

## 2023-05-23 NOTE — Assessment & Plan Note (Addendum)
 Head/neck CTA 05/2023 - multifocal severe stenosis of L vertebral artery V4 segment and 60% stenosed R ICA Already on rosuvastatin and zetia and xarelto 15 mg daily.

## 2023-05-23 NOTE — Assessment & Plan Note (Addendum)
 Not true vertigo or presyncope but rather imbalance/unsteadiness.  ?orthostatic dizziness based on hospital orthostatic vitals (diastolic drops from 90 supine to 63 standing).  She is only on lasix PRN.  Recent CTA head/neck showing significant stenosis of L vertebral artery and 60% stenosis of RICA. No obvious stroke symptoms or stroke history. She is already on crestor and zetia with optimal cholesterol control.  She has upcoming ENT appt for vestibular evaluation - she will keep that.  Will also refer back to neurology for memory and imbalance evaluation - she is not ataxic. ?neurodegenerative disease process.

## 2023-05-23 NOTE — Assessment & Plan Note (Addendum)
 Chronic issue, associated with agitation/aggression.  Previous poor effect to antidepressants tried (sertraline, lexapro).  Resart aricept 5mg  nightly.  Consider retrial mood medication.

## 2023-05-23 NOTE — Assessment & Plan Note (Addendum)
 Carries this diagnosis, has not been confirmed by ENT Current dizzy symptoms are not consistent with vertigo.

## 2023-05-28 DIAGNOSIS — Z7901 Long term (current) use of anticoagulants: Secondary | ICD-10-CM | POA: Diagnosis not present

## 2023-05-28 DIAGNOSIS — F419 Anxiety disorder, unspecified: Secondary | ICD-10-CM | POA: Diagnosis not present

## 2023-05-28 DIAGNOSIS — Z79899 Other long term (current) drug therapy: Secondary | ICD-10-CM | POA: Diagnosis not present

## 2023-05-28 DIAGNOSIS — Z9181 History of falling: Secondary | ICD-10-CM | POA: Diagnosis not present

## 2023-05-28 DIAGNOSIS — I6521 Occlusion and stenosis of right carotid artery: Secondary | ICD-10-CM | POA: Diagnosis not present

## 2023-05-28 DIAGNOSIS — G3184 Mild cognitive impairment, so stated: Secondary | ICD-10-CM | POA: Diagnosis not present

## 2023-05-28 DIAGNOSIS — I5032 Chronic diastolic (congestive) heart failure: Secondary | ICD-10-CM | POA: Diagnosis not present

## 2023-05-28 DIAGNOSIS — N183 Chronic kidney disease, stage 3 unspecified: Secondary | ICD-10-CM | POA: Diagnosis not present

## 2023-05-28 DIAGNOSIS — I4821 Permanent atrial fibrillation: Secondary | ICD-10-CM | POA: Diagnosis not present

## 2023-05-28 DIAGNOSIS — Z950 Presence of cardiac pacemaker: Secondary | ICD-10-CM | POA: Diagnosis not present

## 2023-05-28 DIAGNOSIS — I442 Atrioventricular block, complete: Secondary | ICD-10-CM | POA: Diagnosis not present

## 2023-06-07 DIAGNOSIS — H8103 Meniere's disease, bilateral: Secondary | ICD-10-CM | POA: Diagnosis not present

## 2023-06-07 DIAGNOSIS — H90A21 Sensorineural hearing loss, unilateral, right ear, with restricted hearing on the contralateral side: Secondary | ICD-10-CM | POA: Diagnosis not present

## 2023-06-18 ENCOUNTER — Encounter: Payer: Self-pay | Admitting: Internal Medicine

## 2023-06-19 DIAGNOSIS — Z79899 Other long term (current) drug therapy: Secondary | ICD-10-CM | POA: Diagnosis not present

## 2023-06-19 DIAGNOSIS — Z9181 History of falling: Secondary | ICD-10-CM | POA: Diagnosis not present

## 2023-06-19 DIAGNOSIS — I442 Atrioventricular block, complete: Secondary | ICD-10-CM | POA: Diagnosis not present

## 2023-06-19 DIAGNOSIS — I6521 Occlusion and stenosis of right carotid artery: Secondary | ICD-10-CM | POA: Diagnosis not present

## 2023-06-19 DIAGNOSIS — I5032 Chronic diastolic (congestive) heart failure: Secondary | ICD-10-CM | POA: Diagnosis not present

## 2023-06-19 DIAGNOSIS — N183 Chronic kidney disease, stage 3 unspecified: Secondary | ICD-10-CM | POA: Diagnosis not present

## 2023-06-19 DIAGNOSIS — Z950 Presence of cardiac pacemaker: Secondary | ICD-10-CM | POA: Diagnosis not present

## 2023-06-19 DIAGNOSIS — I4821 Permanent atrial fibrillation: Secondary | ICD-10-CM | POA: Diagnosis not present

## 2023-06-19 DIAGNOSIS — F419 Anxiety disorder, unspecified: Secondary | ICD-10-CM | POA: Diagnosis not present

## 2023-06-19 DIAGNOSIS — Z7901 Long term (current) use of anticoagulants: Secondary | ICD-10-CM | POA: Diagnosis not present

## 2023-06-19 NOTE — Progress Notes (Signed)
 Date:  06/22/2023   ID:  Sierra Kaiser, DOB 1933/05/04, MRN [id number]  Patient Location:  [address] [place] [address]   Provider location:   [facility]er, [place] office  PCP:  Claire Crick, MD  Cardiologist:  [facility]  NW:GNFA  History of Present Illness:    Sierra Kaiser is a 89 y.o. female  past medical history of atrial fibrillation, previously followed at [facility],  long history of atrial fibrillation, trial on numerous medications.  Tikosyn  Started 2012.Multaq.    atrial fibrillation ablation in October 2014. syncope in 2013, March 2015. Possibly from low blood pressure Chronic renal insufficiency AV node ablation, now with pacemaker February 2016 Aortic atherosclerosis, moderate on CT scan 09/2015 Anxiety/stress Mild to moderate mitral valve regurgitation Chronic SOB, "had it for 15 years" She presents for routine follow-up of her atrial fibrillation, paced rhythm, chronic dizziness  Last seen in clinic by myself February 2023 Seen by EP February 2025 Chronic balance issues "Not dizzy", I am drunk/staggery  CT neck reviewed Multifocal severe stenosis of the left vertebral artery V4 segment. Approximately 60% stenosis of the proximal right ICA.  Scheduled to see vascular Jul 10, 2023  Lasix  PRN, rare Denies leg swelling  Denies chest pain concerning for angina, No falls, tolerating Xarelto , Zetia , Crestor  Cholesterol at goal  Memory issues Weight loss, forgetting to eat meals hearing loss, Mnire's disease, chronic dizziness  EKG personally reviewed by myself on todays visit EKG Interpretation Date/Time:  Friday June 22 2023 12:00:32 EDT Ventricular Rate:  70 PR Interval:    QRS Duration:  164 QT Interval:  464 QTC Calculation: 501 R Axis:   -78  Text Interpretation: Ventricular-paced rhythm When compared with ECG of 08-May-2023 09:45, No significant change was found Confirmed by Belva Boyden [phone]) on 06/22/2023 12:24:48 PM     Lab Results  Component Value Date   CHOL 132 04/17/2023   HDL 60.90 04/17/2023   LDLCALC 54 04/17/2023   TRIG 86.0 04/17/2023    Other past medical history reviewed CT scan 09/29/2015 moderate diffuse aortic and iliac artery  Atherosclerosis Unable to exclude distal RCA coronary calcification   Echocardiogram March 2016 showing moderate MR, mildly elevated right heart pressures estimated 40 mmHg   Previously on metoprolol  and tikosyn. Changed to amiodarone  in preparation for cardioversion She underwent cardioversion 11/12/2013 which was successful but she reports going back to atrial fibrillation the next day    Lab work from [facility] 09/28/2012 showing creatinine 1.55, BUN 24 BNP 4800 EKG 09/28/2012 showing atrial fibrillation with RVR, rate 137 beats per minute followup EKG showing normal sinus rhythm with rate 82 beats per minute    Echocardiogram January 2011 was essentially normal. Repeat echocardiogram September 2014 again with normal LV function, normal study   Cardioversion may 2012 Cardioversion 12/06/2012 cardiac cath 05/17/2007 showing no significant CAD   Past Medical History:  Diagnosis Date   Anxiety    CAD (coronary artery disease)    CHF (congestive heart failure) (HCC)    Esophageal reflux    GI bleeding 2012   during EGD necessitating open surgery   Hiatal hernia    History of blood transfusion    History of diverticulitis    History of UTI    HTN (hypertension)    Hyperlipidemia    Meniere's disease 1970s   Mitral regurgitation    Osteopenia 12/11/2015   T score -1.2 femur, -2.0 spine (12/2015)  Permanent atrial fibrillation (HCC)    a. permanent b. s/p PVI RFA at [facility] 10/14 c. failed amio (neuro toxicity) and Tikosyn d. 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    PUD (peptic ulcer disease)    Raynaud's disease    Scalp laceration, subsequent encounter 07/10/2022   SNHL  (sensorineural hearing loss)    right ear   Syncopal episodes    Tinnitus    Tinnitus    Ulcerative colitis (HCC)    per prior records   Urinary incontinence, urge    Past Surgical History:  Procedure Laterality Date   APPENDECTOMY  1973   AV NODE ABLATION N/A 05/06/2014   Procedure: AV NODE ABLATION;  Surgeon: Verona Goodwill, MD;  Location: [facility] CATH LAB;  Service: Cardiovascular;  Laterality: N/A;   CARDIAC CATHETERIZATION  2014   [facility]   CARDIAC ELECTROPHYSIOLOGY STUDY AND ABLATION     CHOLECYSTECTOMY  2010   COLONOSCOPY  2010   polyps   COLONOSCOPY WITH PROPOFOL  N/A 08/29/2017   TAs, no f/u recommended Corky Diener, Alphonsus Jeans, MD)   ESOPHAGOGASTRODUODENOSCOPY (EGD) WITH PROPOFOL  N/A 08/29/2017   benign biopsies Corky Diener, Alphonsus Jeans, MD)   HEMORRHOID SURGERY     LAPAROTOMY  2012   EGD biopsy led to bleeding - needed laparotomy to stop bleed (Dr Broadus Canes at [facility])   MOHS SURGERY     back   PERMANENT PACEMAKER INSERTION N/A 04/22/2014   STJ single chamber pacemaker implanted by Dr Rodolfo Clan   TRANSESOPHAGEAL ECHOCARDIOGRAM WITH CARDIOVERSION  2014   [facility]   UPPER GI ENDOSCOPY  2012   with polypectomy   VAGINAL HYSTERECTOMY  1973   elective; ovaries remained      Allergies:   Gadolinium derivatives, Contrast media [iodinated contrast media], Gabapentin, Metronidazole, Other, Sertraline , and Diltiazem  hcl   Social History   Tobacco Use   Smoking status: Never   Smokeless tobacco: Never  Vaping Use   Vaping status: Never Used  Substance Use Topics   Alcohol use: Yes    Alcohol/week: 1.0 standard drink of alcohol    Types: 1 Glasses of wine per week    Comment: 1-2 glasses wine a month   Drug use: Never     Current Outpatient Medications on File Prior to Visit  Medication Sig Dispense Refill   Cholecalciferol (VITAMIN D3) 25 MCG (1000 UT) CAPS Take 2 capsules (2,000 Units total) by mouth daily.     cyanocobalamin  (,VITAMIN B-12,) 1000 MCG/ML injection Inject  1 mL (1,000 mcg total) into the muscle every 30 (thirty) days.     donepezil  (ARICEPT ) 5 MG tablet Take 1 tablet (5 mg total) by mouth at bedtime. 30 tablet 3   ezetimibe  (ZETIA ) 10 MG tablet Take 1 tablet (10 mg total) by mouth daily. 90 tablet 4   fluticasone  (FLONASE ) 50 MCG/ACT nasal spray Place 2 sprays into both nostrils daily. 48 mL 4   furosemide  (LASIX ) 20 MG tablet TAKE 1 TABLET (20 MG TOTAL) BY MOUTH AS NEEDED FOR FLUID OR EDEMA. 30 tablet 3   meclizine  (ANTIVERT ) 25 MG tablet Take 0.5-1 tablets (12.5-25 mg total) by mouth 2 (two) times daily as needed for dizziness. 30 tablet 0   ondansetron  (ZOFRAN ) 4 MG tablet Take 1 tablet (4 mg total) by mouth every 6 (six) hours as needed for nausea. 20 tablet 0   Polyethyl Glycol-Propyl Glycol (SYSTANE OP) Place 1 drop into both eyes daily as needed (dry eyes).  Rivaroxaban  (XARELTO ) 15 MG TABS tablet Take 1 tablet (15 mg total) by mouth daily with supper. 30 tablet 1   rosuvastatin  (CRESTOR ) 20 MG tablet Take 1 tablet (20 mg total) by mouth every other day. 45 tablet 4   No current facility-administered medications on file prior to visit.    Family Hx: The patient's family history includes Alcohol abuse in her brother; Arrhythmia in her mother; Breast cancer (age of onset: 73) in her sister; Cancer in her brother; Diabetes in her mother and son; Heart failure in her brother and brother; Hypertension in her mother; Stroke in her brother and sister.  ROS:   Please see the history of present illness.    Review of Systems  Constitutional:  Positive for weight loss.  HENT: Negative.    Respiratory: Negative.    Cardiovascular: Negative.   Gastrointestinal: Negative.   Musculoskeletal: Negative.   Neurological:  Positive for dizziness.  Psychiatric/Behavioral: Negative.    All other systems reviewed and are negative.    Labs/Other Tests and Data Reviewed:    Recent Labs: 04/19/2023: TSH 3.48 05/09/2023: ALT 14; BUN 29; Creatinine, Ser  0.93; Hemoglobin 12.0; Platelets 158; Potassium 4.5; Sodium 143   Recent Lipid Panel Lab Results  Component Value Date/Time   CHOL 132 04/17/2023 03:47 PM   CHOL 159 08/15/2016 11:14 AM   TRIG 86.0 04/17/2023 03:47 PM   HDL 60.90 04/17/2023 03:47 PM   HDL 57 08/15/2016 11:14 AM   CHOLHDL 2 04/17/2023 03:47 PM   LDLCALC 54 04/17/2023 03:47 PM   LDLCALC 73 08/15/2016 11:14 AM    Wt Readings from Last 3 Encounters:  06/22/23 127 lb 12.8 oz (58 kg)  05/22/23 133 lb 2 oz (60.4 kg)  05/08/23 134 lb 7.7 oz (61 kg)     Exam:    BP (!) 120/54 (BP Location: Left Arm, Patient Position: Sitting, Cuff Size: Normal)   Pulse 70   Ht 5\' 8"  (1.727 m)   Wt 127 lb 12.8 oz (58 kg)   SpO2 98%   BMI 19.43 kg/m  Constitutional:  oriented to person, place, and time. No distress.  HENT:  Head: Grossly normal Eyes:  no discharge. No scleral icterus.  Neck: No JVD, no carotid bruits  Cardiovascular: Regular rate and rhythm, no murmurs appreciated Pulmonary/Chest: Clear to auscultation bilaterally, no wheezes or rales Abdominal: Soft.  no distension.  no tenderness.  Musculoskeletal: Normal range of motion Neurological:  normal muscle tone. Coordination normal. No atrophy Skin: Skin warm and dry Psychiatric: normal affect, pleasant   ASSESSMENT & PLAN:    Problem List Items Addressed This Visit       Cardiology Problems   Atrial fibrillation, permanent (HCC) - Primary   Relevant Orders   EKG 12-Lead (Completed)   Complete heart block (HCC)   Other Visit Diagnoses       Cardiac pacemaker in situ         Heart failure with improved ejection fraction (HFimpEF) (HCC)         Hx of atrioventricular node ablation         Chronic systolic heart failure (HCC)         Hyperlipidemia, mixed         Secondary hypercoagulable state (HCC)          SOB, chronic issue Stable symptoms in the setting of Chronic diastolic and systolic CHF/ejection fraction 45 to 50%, atrial fibrillation,  RV  paced, deconditioning No further workup needed  Chronic  dizziness Longstanding history of dizziness, " feeling drunk" Neurocognitive issues could also be contributing to orthostasis Vertebral arterial disease on CT scan, multifocal  Paced rhythm Followed by  sinus node dysfunction Downloads reviewed  Atrial fib On xarelto , no falls  Hyperlipidemia Continue Crestor  every other day with Zetia  Cholesterol at goal   Signed, Kirkland Figg, MD  [facility] [address], [place], [place] [address]

## 2023-06-22 ENCOUNTER — Encounter: Payer: Self-pay | Admitting: Cardiovascular Disease

## 2023-06-22 ENCOUNTER — Ambulatory Visit: Attending: Cardiovascular Disease | Admitting: Cardiovascular Disease

## 2023-06-22 VITALS — BP 120/54 | HR 70 | Ht 68.0 in | Wt 127.8 lb

## 2023-06-22 DIAGNOSIS — I442 Atrioventricular block, complete: Secondary | ICD-10-CM | POA: Diagnosis not present

## 2023-06-22 DIAGNOSIS — I5032 Chronic diastolic (congestive) heart failure: Secondary | ICD-10-CM | POA: Diagnosis not present

## 2023-06-22 DIAGNOSIS — I4821 Permanent atrial fibrillation: Secondary | ICD-10-CM

## 2023-06-22 DIAGNOSIS — I5022 Chronic systolic (congestive) heart failure: Secondary | ICD-10-CM

## 2023-06-22 DIAGNOSIS — D6869 Other thrombophilia: Secondary | ICD-10-CM | POA: Diagnosis not present

## 2023-06-22 DIAGNOSIS — Z950 Presence of cardiac pacemaker: Secondary | ICD-10-CM | POA: Diagnosis not present

## 2023-06-22 DIAGNOSIS — E782 Mixed hyperlipidemia: Secondary | ICD-10-CM | POA: Diagnosis not present

## 2023-06-22 DIAGNOSIS — Z9889 Other specified postprocedural states: Secondary | ICD-10-CM | POA: Diagnosis not present

## 2023-06-22 MED ORDER — RIVAROXABAN 15 MG PO TABS: 15.0000 mg | ORAL_TABLET | Freq: Every day | ORAL | 1 refills | Status: DC

## 2023-06-22 NOTE — Addendum Note (Signed)
 Addended by: Lott Rouleau A on: 06/22/2023 03:40 PM   Modules accepted: Orders

## 2023-06-22 NOTE — Patient Instructions (Signed)

## 2023-06-22 NOTE — Progress Notes (Signed)
 Remote pacemaker transmission.

## 2023-06-26 ENCOUNTER — Ambulatory Visit: Payer: HMO | Admitting: Cardiovascular Disease

## 2023-07-10 ENCOUNTER — Other Ambulatory Visit: Payer: Self-pay | Admitting: Nurse Practitioner

## 2023-07-10 ENCOUNTER — Ambulatory Visit (INDEPENDENT_AMBULATORY_CARE_PROVIDER_SITE_OTHER): Payer: Self-pay | Admitting: Nurse Practitioner

## 2023-07-10 ENCOUNTER — Encounter (INDEPENDENT_AMBULATORY_CARE_PROVIDER_SITE_OTHER): Payer: Self-pay | Admitting: Nurse Practitioner

## 2023-07-10 ENCOUNTER — Ambulatory Visit (INDEPENDENT_AMBULATORY_CARE_PROVIDER_SITE_OTHER)

## 2023-07-10 VITALS — BP 103/65 | HR 79 | Resp 18 | Ht 67.5 in | Wt 128.2 lb

## 2023-07-10 DIAGNOSIS — I6521 Occlusion and stenosis of right carotid artery: Secondary | ICD-10-CM

## 2023-07-10 DIAGNOSIS — I6523 Occlusion and stenosis of bilateral carotid arteries: Secondary | ICD-10-CM | POA: Diagnosis not present

## 2023-07-10 DIAGNOSIS — R6889 Other general symptoms and signs: Secondary | ICD-10-CM

## 2023-07-11 ENCOUNTER — Encounter: Payer: Self-pay | Admitting: *Deleted

## 2023-07-16 NOTE — Progress Notes (Signed)
 Subjective:    Patient ID: Sierra Kaiser, female    DOB: [birth year], 89 y.o.   MRN: [id number] Chief Complaint  Patient presents with   New Patient (Initial Visit)    np. consult. carotid done on 4.3.25 in hospital per Dr. Donnie Galea. carotid stenosis + dizziness. vaught, creighton.    Sierra Kaiser is an 89 year old female who presents today for evaluation of her carotid arteries.  She endorses having a drunken sensation feeling where she feels off balance and off balance the way you would feel as if you were drunk.  She adamantly denies she is dizzy.  This sensation began suddenly and she went to the emergency room on 06/07/2023.  She underwent a CTA at that time which showed severe stenosis of the left vertebral artery but with a widely patent, dominant right vertebral artery.  In addition there was 60% stenosis of the proximal right ICA.  The patient additionally had noninvasive studies today which shows 1 to 39% stenosis of the bilateral internal carotid arteries.  No evidence of steal syndrome present.  The vertebral arteries have antegrade flow but the left vertebral has high resistance flow which would be consistent with severe stenosis seen on CT scan.    Review of Systems  All other systems reviewed and are negative.      Objective:    Physical Exam Vitals reviewed.  HENT:     Head: Normocephalic.  Cardiovascular:     Rate and Rhythm: Normal rate.     Pulses: Normal pulses.  Pulmonary:     Effort: Pulmonary effort is normal.  Skin:    General: Skin is warm and dry.  Neurological:     Mental Status: She is alert and oriented to person, place, and time.  Psychiatric:        Mood and Affect: Mood normal.        Behavior: Behavior normal.        Thought Content: Thought content normal.        Judgment: Judgment normal.     BP 103/65   Pulse 79   Resp 18   Ht 5' 7.5" (1.715 m)   Wt 128 lb 3.2 oz (58.2 kg)   BMI 19.78 kg/m   Past Medical History:  Diagnosis Date    Anxiety    CAD (coronary artery disease)    CHF (congestive heart failure) (HCC)    Esophageal reflux    GI bleeding 2012   during EGD necessitating open surgery   Hiatal hernia    History of blood transfusion    History of diverticulitis    History of UTI    HTN (hypertension)    Hyperlipidemia    Meniere's disease 1970s   Mitral regurgitation    Osteopenia 12/11/2015   T score -1.2 femur, -2.0 spine (12/2015)   Permanent atrial fibrillation (HCC)    a. permanent b. s/p PVI RFA at [facility] 10/14 c. failed amio (neuro toxicity) and Tikosyn d. 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    PUD (peptic ulcer disease)    Raynaud's disease    Scalp laceration, subsequent encounter 07/10/2022   SNHL (sensorineural hearing loss)    right ear   Syncopal episodes    Tinnitus    Tinnitus    Ulcerative colitis (HCC)    per prior records   Urinary incontinence, urge     Social History   Socioeconomic History   Marital status: Married  Spouse name: Not on file   Number of children: Not on file   Years of education: Not on file   Highest education level: Not on file  Occupational History   Not on file  Tobacco Use   Smoking status: Never   Smokeless tobacco: Never  Vaping Use   Vaping status: Never Used  Substance and Sexual Activity   Alcohol use: Yes    Alcohol/week: 1.0 standard drink of alcohol    Types: 1 Glasses of wine per week    Comment: 1-2 glasses wine a month   Drug use: Never   Sexual activity: Never  Other Topics Concern   Not on file  Social History Narrative   Lives with husband.    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)   Edu: [employer]   Social Drivers of Corporate investment banker Strain: Low Risk  (03/19/2023)   Overall Financial Resource Strain (CARDIA)    Difficulty of Paying Living Expenses: Not hard at all  Food Insecurity: No Food Insecurity (05/08/2023)   Hunger Vital Sign     Worried About Running Out of Food in the Last Year: Never true    Ran Out of Food in the Last Year: Never true  Transportation Needs: No Transportation Needs (05/08/2023)   PRAPARE - Administrator, Civil Service (Medical): No    Lack of Transportation (Non-Medical): No  Physical Activity: Inactive (03/19/2023)   Exercise Vital Sign    Days of Exercise per Week: 0 days    Minutes of Exercise per Session: 0 min  Stress: No Stress Concern Present (03/19/2023)   [employer] of Occupational Health - Occupational Stress Questionnaire    Feeling of Stress : Not at all  Social Connections: Moderately Integrated (05/08/2023)   Social Connection and Isolation Panel [NHANES]    Frequency of Communication with Friends and Family: More than three times a week    Frequency of Social Gatherings with Friends and Family: More than three times a week    Attends Religious Services: More than 4 times per year    Active Member of [employer] or Organizations: No    Attends Banker Meetings: Never    Marital Status: Married  Catering manager Violence: Not At Risk (05/08/2023)   Humiliation, Afraid, Rape, and Kick questionnaire    Fear of Current or Ex-Partner: No    Emotionally Abused: No    Physically Abused: No    Sexually Abused: No    Past Surgical History:  Procedure Laterality Date   APPENDECTOMY  1973   AV NODE ABLATION N/A 05/06/2014   Procedure: AV NODE ABLATION;  Surgeon: Verona Goodwill, MD;  Location: [facility] C[facility] LAB;  Service: Cardiovascular;  Laterality: N/A;   CARDIAC CATHETERIZATION  2014   [facility]   CARDIAC ELECTROPHYSIOLOGY STUDY AND ABLATION     CHOLECYSTECTOMY  2010   COLONOSCOPY  2010   polyps   COLONOSCOPY WITH PROPOFOL  N/A 08/29/2017   TAs, no f/u recommended Corky Diener, Alphonsus Jeans, MD)   ESOPHAGOGASTRODUODENOSCOPY (EGD) WITH PROPOFOL  N/A 08/29/2017   benign biopsies Corky Diener, Alphonsus Jeans, MD)   HEMORRHOID SURGERY     LAPAROTOMY  2012   EGD biopsy led to bleeding  - needed laparotomy to stop bleed (Dr Broadus Canes at [place] [facility])   MOHS SURGERY     back   PERMANENT PACEMAKER INSERTION N/A 04/22/2014   STJ single chamber pacemaker implanted by Dr Rodolfo Clan  TRANSESOPHAGEAL ECHOCARDIOGRAM WITH CARDIOVERSION  2014   [facility]   UPPER GI ENDOSCOPY  2012   with polypectomy   VAGINAL HYSTERECTOMY  1973   elective; ovaries remained    Family History  Problem Relation Age of Onset   Arrhythmia Mother    Hypertension Mother    Diabetes Mother    Heart failure Brother    Heart failure Brother    Cancer Brother        colon (with colostomy)   Breast cancer Sister 87   Stroke Sister    Alcohol abuse Brother    Stroke Brother    Diabetes Son     Allergies  Allergen Reactions   Gadolinium Derivatives Itching and Rash    Persisted for months   Contrast Media [Iodinated Contrast Media] Hives        Gabapentin Other (See Comments)    Affected mood bad   Metronidazole Itching and Other (See Comments)    Hands really red   Other    Sertraline  Other (See Comments)    tremors   Diltiazem  Hcl Itching and Rash       Latest Ref Rng & Units 05/09/2023    4:46 AM 05/08/2023    9:35 AM 04/17/2023    3:47 PM  CBC  WBC 4.0 - 10.5 K/uL 7.8  4.9  6.9   Hemoglobin 12.0 - 15.0 g/dL 16.1  09.6  04.5   Hematocrit 36.0 - 46.0 % 33.9  37.6  42.9   Platelets 150 - 400 K/uL 158  150  183.0        CMP     Component Value Date/Time   NA 143 05/09/2023 0446   NA 148 (H) 10/30/2016 0957   NA 141 11/04/2013 1815   K 4.5 05/09/2023 0446   K 4.3 11/04/2013 1815   CL 113 (H) 05/09/2023 0446   CL 112 (H) 11/04/2013 1815   CO2 24 05/09/2023 0446   CO2 22 11/04/2013 1815   GLUCOSE 100 (H) 05/09/2023 0446   GLUCOSE 114 (H) 11/04/2013 1815   BUN 29 (H) 05/09/2023 0446   BUN 25 10/30/2016 0957   BUN 24 (H) 11/04/2013 1815   CREATININE 0.93 05/09/2023 0446   CREATININE 1.61 (H) 11/04/2013 1815   CALCIUM  9.4 05/09/2023 0446   CALCIUM  9.5 11/04/2013 1815    PROT 5.8 (L) 05/09/2023 0446   PROT 6.9 04/30/2023 1112   PROT 6.9 09/28/2012 1522   ALBUMIN 3.6 05/09/2023 0446   ALBUMIN 4.5 08/15/2016 1114   ALBUMIN 3.8 09/28/2012 1522   AST 13 (L) 05/09/2023 0446   AST 14 (L) 09/28/2012 1522   ALT 14 05/09/2023 0446   ALT 22 09/28/2012 1522   ALKPHOS 41 05/09/2023 0446   ALKPHOS 71 09/28/2012 1522   BILITOT 0.8 05/09/2023 0446   BILITOT 0.4 08/15/2016 1114   BILITOT 0.3 09/28/2012 1522   GFR 43.59 (L) 04/30/2023 1112   GFRNONAA 59 (L) 05/09/2023 0446   GFRNONAA 30 (L) 11/04/2013 1815     No results found.     Assessment & Plan:   1. Feeling of being drunk (Primary) The patient does have stenosis of her left vertebral artery however it is not her dominant vertebral artery.  In addition the patient's basilar artery is widely patent with no areas of significant stenosis.  Based on that we feel that she has adequate circulation and her feelings of drunkenness or not being caused by poor perfusion.  I suspect that there may be  more of a neurological component.  Will refer the patient to neurology for further workup and evaluation. - Ambulatory referral to Neurology  2. Stenosis of right carotid artery Recommend:  Given the patient's asymptomatic subcritical stenosis no further invasive testing or surgery at this time.  Duplex ultrasound shows <40% stenosis bilaterally via ultrasound.  How ever review of CT was approximately 60% of the proximal ICA.  Continue antiplatelet therapy as prescribed Continue management of CAD, HTN and Hyperlipidemia Healthy heart diet,  encouraged exercise at least 4 times per week  Follow up in 6 months with duplex ultrasound and physical exam    Current Outpatient Medications on File Prior to Visit  Medication Sig Dispense Refill   Cholecalciferol (VITAMIN D3) 25 MCG (1000 UT) CAPS Take 2 capsules (2,000 Units total) by mouth daily.     cyanocobalamin  (,VITAMIN B-12,) 1000 MCG/ML injection Inject 1 mL  (1,000 mcg total) into the muscle every 30 (thirty) days.     donepezil  (ARICEPT ) 5 MG tablet Take 1 tablet (5 mg total) by mouth at bedtime. 30 tablet 3   ezetimibe  (ZETIA ) 10 MG tablet Take 1 tablet (10 mg total) by mouth daily. 90 tablet 4   fluticasone  (FLONASE ) 50 MCG/ACT nasal spray Place 2 sprays into both nostrils daily. 48 mL 4   furosemide  (LASIX ) 20 MG tablet TAKE 1 TABLET (20 MG TOTAL) BY MOUTH AS NEEDED FOR FLUID OR EDEMA. 30 tablet 3   meclizine  (ANTIVERT ) 25 MG tablet Take 0.5-1 tablets (12.5-25 mg total) by mouth 2 (two) times daily as needed for dizziness. 30 tablet 0   ondansetron  (ZOFRAN ) 4 MG tablet Take 1 tablet (4 mg total) by mouth every 6 (six) hours as needed for nausea. 20 tablet 0   Polyethyl Glycol-Propyl Glycol (SYSTANE OP) Place 1 drop into both eyes daily as needed (dry eyes).     Rivaroxaban  (XARELTO ) 15 MG TABS tablet Take 1 tablet (15 mg total) by mouth daily with supper. 90 tablet 1   rosuvastatin  (CRESTOR ) 20 MG tablet Take 1 tablet (20 mg total) by mouth every other day. 45 tablet 4   No current facility-administered medications on file prior to visit.    There are no Patient Instructions on file for this visit. No follow-ups on file.   Elianie Hubers E Sharlette Jansma, NP

## 2023-07-18 ENCOUNTER — Ambulatory Visit: Payer: HMO | Admitting: Family Medicine

## 2023-07-18 DIAGNOSIS — Z85828 Personal history of other malignant neoplasm of skin: Secondary | ICD-10-CM | POA: Diagnosis not present

## 2023-07-18 DIAGNOSIS — D2262 Melanocytic nevi of left upper limb, including shoulder: Secondary | ICD-10-CM | POA: Diagnosis not present

## 2023-07-18 DIAGNOSIS — D2272 Melanocytic nevi of left lower limb, including hip: Secondary | ICD-10-CM | POA: Diagnosis not present

## 2023-07-18 DIAGNOSIS — D225 Melanocytic nevi of trunk: Secondary | ICD-10-CM | POA: Diagnosis not present

## 2023-07-18 DIAGNOSIS — D2261 Melanocytic nevi of right upper limb, including shoulder: Secondary | ICD-10-CM | POA: Diagnosis not present

## 2023-07-18 DIAGNOSIS — L570 Actinic keratosis: Secondary | ICD-10-CM | POA: Diagnosis not present

## 2023-07-23 DIAGNOSIS — H8103 Meniere's disease, bilateral: Secondary | ICD-10-CM | POA: Diagnosis not present

## 2023-07-23 DIAGNOSIS — R42 Dizziness and giddiness: Secondary | ICD-10-CM | POA: Diagnosis not present

## 2023-07-23 DIAGNOSIS — H9121 Sudden idiopathic hearing loss, right ear: Secondary | ICD-10-CM | POA: Diagnosis not present

## 2023-07-24 ENCOUNTER — Encounter (INDEPENDENT_AMBULATORY_CARE_PROVIDER_SITE_OTHER): Payer: Self-pay

## 2023-07-27 ENCOUNTER — Ambulatory Visit (INDEPENDENT_AMBULATORY_CARE_PROVIDER_SITE_OTHER): Payer: HMO | Admitting: Family Medicine

## 2023-07-27 ENCOUNTER — Encounter: Payer: Self-pay | Admitting: Family Medicine

## 2023-07-27 VITALS — BP 116/64 | HR 77 | Temp 97.9°F | Ht 67.5 in | Wt 123.0 lb

## 2023-07-27 DIAGNOSIS — M546 Pain in thoracic spine: Secondary | ICD-10-CM

## 2023-07-27 DIAGNOSIS — R42 Dizziness and giddiness: Secondary | ICD-10-CM

## 2023-07-27 DIAGNOSIS — R451 Restlessness and agitation: Secondary | ICD-10-CM

## 2023-07-27 DIAGNOSIS — L299 Pruritus, unspecified: Secondary | ICD-10-CM | POA: Diagnosis not present

## 2023-07-27 DIAGNOSIS — I6521 Occlusion and stenosis of right carotid artery: Secondary | ICD-10-CM

## 2023-07-27 DIAGNOSIS — N1831 Chronic kidney disease, stage 3a: Secondary | ICD-10-CM | POA: Diagnosis not present

## 2023-07-27 DIAGNOSIS — E538 Deficiency of other specified B group vitamins: Secondary | ICD-10-CM

## 2023-07-27 DIAGNOSIS — G3184 Mild cognitive impairment, so stated: Secondary | ICD-10-CM | POA: Diagnosis not present

## 2023-07-27 DIAGNOSIS — F419 Anxiety disorder, unspecified: Secondary | ICD-10-CM | POA: Diagnosis not present

## 2023-07-27 DIAGNOSIS — H8109 Meniere's disease, unspecified ear: Secondary | ICD-10-CM

## 2023-07-27 MED ORDER — CYANOCOBALAMIN 1000 MCG/ML IJ SOLN
1000.0000 ug | Freq: Once | INTRAMUSCULAR | Status: AC
  Administered 2023-07-27: 1000 ug via INTRAMUSCULAR

## 2023-07-27 MED ORDER — DONEPEZIL HCL 10 MG PO TABS: 10.0000 mg | ORAL_TABLET | Freq: Every day | ORAL | 1 refills | Status: AC

## 2023-07-27 NOTE — Assessment & Plan Note (Signed)
 Update B12 shot today.  Encourage continue monthly B12 shots, via nurse visits through our ofifce

## 2023-07-27 NOTE — Patient Instructions (Addendum)
 Call neurology to schedule an appointment: [facility] - Neurology [address] [place], [place] [address] Phone: [phone]  For right thoracic back pain - likely pulled muscle /strain. Heating pad covered in a towel for 10-15 min at a time a few times a day, may use tylenol  orally or topical voltaren gel.   Increase aricept  to 10mg  nightly.  Ok to continue hydrochlorothiazide 12.5mg  daily in the morning.  Return to see me in 3 months for follow up visit.   B12 shot today

## 2023-07-27 NOTE — Progress Notes (Signed)
 Ph: [phone] Fax: [phone]   Patient ID: Sierra Kaiser, female    DOB: 1934/01/17, 89 y.o.   MRN: [id number]  This visit was conducted in person.  BP 116/64   Pulse 77   Temp 97.9 F (36.6 C) (Oral)   Ht 5' 7.5" (1.715 m)   Wt 123 lb (55.8 kg)   SpO2 95%   BMI 18.98 kg/m    CC: 3 mo f/u visit  Subjective:   HPI: Sierra Kaiser is a 89 y.o. female presenting on 07/27/2023 for Medical Management of Chronic Issues (Here for 3 mo f/u.)   See prior note for details.  No longer driving. Sister Trevor Fudge drove her here today. Significant family stressors.   Drunk feeling present for months.  Hospitalized 05/2023 for these symptoms - head CT and CT angio head/neck reassuringly without findings for acute stroke or other cause but she did have multifocal severe stenosis of L vertebral artery V4 segment and 60% stenosed R ICA.    H/o afib, HFpEF, heart block s/p PPM - sees afib clinic on xarelto  15mg  daily. Since last seen, has also seen ENT cardiology and [facility] - vascular surgery didn't recommend invasive intervention for above known PAD.   [facility]aw [facility] 06/2023, again this week 07/2023 - ?meclizine  vs vestibular. Initial medrol  dose pack didn't help symptoms. Started hydrochlorothiazide yesterday - has taken 2 doses and notes improvement in drunken feeling. Does have hearing loss, however denies tinnitus or true vertigo symptoms.   She is emphatic that she doesn't have dizziness/vertigo "room spinning" but rather "drunkenness" imbalance feeling - needs to hod on to wall to walk.   Pending return to neurology Mason Sole at [facility]) for ongoing cognitive impairment - this has not yet been scheduled. Last MMSE 25/30 (05/2023). She is now on aricept  5mg  nightly.   Main concern today is R sided thoracic back pain that started a few days ago. Sister notes she stays very active. Pt thinks she may have strained a muscle. No significant shoulder pain.      Relevant past medical, surgical, family  and social history reviewed and updated as indicated. Interim medical history since our last visit reviewed. Allergies and medications reviewed and updated. Outpatient Medications Prior to Visit  Medication Sig Dispense Refill   Cetirizine HCl (ZYRTEC PO) Take by mouth.     Cholecalciferol (VITAMIN D3) 25 MCG (1000 UT) CAPS Take 2 capsules (2,000 Units total) by mouth daily.     cyanocobalamin  (,VITAMIN B-12,) 1000 MCG/ML injection Inject 1 mL (1,000 mcg total) into the muscle every 30 (thirty) days.     ezetimibe  (ZETIA ) 10 MG tablet Take 1 tablet (10 mg total) by mouth daily. 90 tablet 4   fluticasone  (FLONASE ) 50 MCG/ACT nasal spray Place 2 sprays into both nostrils daily. 48 mL 4   furosemide  (LASIX ) 20 MG tablet TAKE 1 TABLET (20 MG TOTAL) BY MOUTH AS NEEDED FOR FLUID OR EDEMA. 30 tablet 3   hydrochlorothiazide (MICROZIDE) 12.5 MG capsule Take 1 capsule (12.5 mg total) by mouth daily.     Polyethyl Glycol-Propyl Glycol (SYSTANE OP) Place 1 drop into both eyes daily as needed (dry eyes).     Rivaroxaban  (XARELTO ) 15 MG TABS tablet Take 1 tablet (15 mg total) by mouth daily with supper. 90 tablet 1   rosuvastatin  (CRESTOR ) 20 MG tablet Take 1 tablet (20 mg total) by mouth every other day. 45 tablet 4   donepezil  (ARICEPT ) 5 MG tablet Take 1 tablet (5  mg total) by mouth at bedtime. 30 tablet 3   HYDROCHLOROTHIAZIDE PO Take by mouth daily.     meclizine  (ANTIVERT ) 25 MG tablet Take 0.5-1 tablets (12.5-25 mg total) by mouth 2 (two) times daily as needed for dizziness. (Patient not taking: Reported on 07/27/2023) 30 tablet 0   ondansetron  (ZOFRAN ) 4 MG tablet Take 1 tablet (4 mg total) by mouth every 6 (six) hours as needed for nausea. (Patient not taking: Reported on 07/27/2023) 20 tablet 0   No facility-administered medications prior to visit.     Per HPI unless specifically indicated in ROS section below Review of Systems  Objective:  BP 116/64   Pulse 77   Temp 97.9 F (36.6 C) (Oral)    Ht 5' 7.5" (1.715 m)   Wt 123 lb (55.8 kg)   SpO2 95%   BMI 18.98 kg/m   Wt Readings from Last 3 Encounters:  07/27/23 123 lb (55.8 kg)  07/10/23 128 lb 3.2 oz (58.2 kg)  06/22/23 127 lb 12.8 oz (58 kg)      Physical Exam Vitals and nursing note reviewed.  Constitutional:      Appearance: Normal appearance. She is not ill-appearing.  HENT:     Mouth/Throat:     Mouth: Mucous membranes are moist.     Pharynx: Oropharynx is clear. No oropharyngeal exudate or posterior oropharyngeal erythema.  Eyes:     Extraocular Movements: Extraocular movements intact.     Pupils: Pupils are equal, round, and reactive to light.  Neck:     Thyroid : No thyroid  mass or thyromegaly.  Cardiovascular:     Rate and Rhythm: Normal rate and regular rhythm.     Pulses: Normal pulses.     Heart sounds: Normal heart sounds. No murmur heard. Pulmonary:     Effort: Pulmonary effort is normal. No respiratory distress.     Breath sounds: Normal breath sounds. No wheezing, rhonchi or rales.  Musculoskeletal:     Cervical back: Normal range of motion and neck supple.     Right lower leg: No edema.     Left lower leg: No edema.     Comments:  Reproducible tenderness to palpation of right lower rhomboid  Skin:    General: Skin is warm and dry.     Findings: No rash.  Neurological:     Mental Status: She is alert.  Psychiatric:        Mood and Affect: Mood normal.        Behavior: Behavior normal.       Lab Results  Component Value Date   NA 143 05/09/2023   CL 113 (H) 05/09/2023   K 4.5 05/09/2023   CO2 24 05/09/2023   BUN 29 (H) 05/09/2023   CREATININE 0.93 05/09/2023   GFRNONAA 59 (L) 05/09/2023   CALCIUM  9.4 05/09/2023   PHOS 3.6 04/17/2023   ALBUMIN 3.6 05/09/2023   GLUCOSE 100 (H) 05/09/2023    Lab Results  Component Value Date   WBC 7.8 05/09/2023   HGB 12.0 05/09/2023   HCT 33.9 (L) 05/09/2023   MCV 92.6 05/09/2023   PLT 158 05/09/2023    Lab Results  Component Value Date    VITAMINB12 334 04/17/2023    Lab Results  Component Value Date   TSH 3.48 04/19/2023   T3TOTAL 98 06/30/2021    Assessment & Plan:   Problem List Items Addressed This Visit     Meniere's disease   Saw ENT, started hydrochlorothiazide with improvement in "  drunk" feeling. Continue this.  Denies true vertigo or tinnitus.       CKD (chronic kidney disease) stage 3, GFR 30-59 ml/min (HCC)   Continue to monitor kidney function ,esp in setting of recent thiazide diuretic commencement.       Low serum vitamin B12   Update B12 shot today.  Encourage continue monthly B12 shots, via nurse visits through our ofifce      Anxiety with agitation   Ongoing difficulty.  Previous poor effect to SSRI antidepressant trial (sertraline , lexapro) Increase aricept  to 10mg  nightly.  Consider trial SNRI vs buspar      Pruritus   Continue zyrtec 10mg  daily       Dizziness - Primary   Emphatic she doesn't have vertigo or "dizziness" but rather drunk imbalanced feeling.  Medrol  dosepak didn't help.  Appreciate ENT care - now on low dose hydrochlorothiazide with some improvement after just 2 doses- continue this.  May need to taper dose of lasix .       Mild cognitive impairment with memory loss   Increase aricept  to 10mg  nightly. Appreciate sister's assistance.  Encourage neuro f/u - # provided to call and schedule f/u appt (last seen 2022).       Carotid stenosis   Deemed not surgical candidate, planned [facility] f/u in 6 months.       Relevant Medications   hydrochlorothiazide (MICROZIDE) 12.5 MG capsule   Acute right-sided thoracic back pain   Suspect rhomboid strain.  Supportive measures reviewed - heating pad with burn precautions, oral tylenol , topical voltaren.  Update if ongoing or worsening.  No evidence of shingles rash to skin, lung exam largely normal.         Meds ordered this encounter  Medications   donepezil  (ARICEPT ) 10 MG tablet    Sig: Take 1 tablet (10 mg total)  by mouth at bedtime.    Dispense:  90 tablet    Refill:  1    Note new does   cyanocobalamin  (VITAMIN B12) injection 1,000 mcg    No orders of the defined types were placed in this encounter.   Patient Instructions  Call neurology to schedule an appointment: [facility] - Neurology [address] [place], [place] [address] Phone: [phone]  For right thoracic back pain - likely pulled muscle /strain. Heating pad covered in a towel for 10-15 min at a time a few times a day, may use tylenol  orally or topical voltaren gel.   Increase aricept  to 10mg  nightly.  Ok to continue hydrochlorothiazide 12.5mg  daily in the morning.  Return to see me in 3 months for follow up visit.   B12 shot today  Follow up plan: Return in about 3 months (around 10/27/2023) for follow up visit.  Claire Crick, MD

## 2023-07-28 DIAGNOSIS — M546 Pain in thoracic spine: Secondary | ICD-10-CM | POA: Insufficient documentation

## 2023-07-28 NOTE — Assessment & Plan Note (Signed)
 Emphatic she doesn't have vertigo or "dizziness" but rather drunk imbalanced feeling.  Medrol  dosepak didn't help.  Appreciate ENT care - now on low dose hydrochlorothiazide with some improvement after just 2 doses- continue this.  May need to taper dose of lasix .

## 2023-07-28 NOTE — Assessment & Plan Note (Addendum)
 Ongoing difficulty.  Previous poor effect to SSRI antidepressant trial (sertraline , lexapro) Increase aricept  to 10mg  nightly.  Consider trial SNRI vs buspar

## 2023-07-28 NOTE — Assessment & Plan Note (Signed)
   Continue zyrtec 10mg daily. 

## 2023-07-28 NOTE — Assessment & Plan Note (Addendum)
 Increase aricept  to 10mg  nightly. Appreciate sister's assistance.  Encourage neuro f/u - # provided to call and schedule f/u appt (last seen 2022).

## 2023-07-28 NOTE — Assessment & Plan Note (Addendum)
 Continue to monitor kidney function ,esp in setting of recent thiazide diuretic commencement.

## 2023-07-28 NOTE — Assessment & Plan Note (Signed)
 Deemed not surgical candidate, planned [facility] f/u in 6 months.

## 2023-07-28 NOTE — Assessment & Plan Note (Addendum)
 Saw ENT, started hydrochlorothiazide with improvement in "drunk" feeling. Continue this.  Denies true vertigo or tinnitus.

## 2023-07-28 NOTE — Assessment & Plan Note (Signed)
 Suspect rhomboid strain.  Supportive measures reviewed - heating pad with burn precautions, oral tylenol , topical voltaren.  Update if ongoing or worsening.  No evidence of shingles rash to skin, lung exam largely normal.

## 2023-08-03 DIAGNOSIS — F02B18 Dementia in other diseases classified elsewhere, moderate, with other behavioral disturbance: Secondary | ICD-10-CM | POA: Diagnosis not present

## 2023-08-03 DIAGNOSIS — R42 Dizziness and giddiness: Secondary | ICD-10-CM | POA: Diagnosis not present

## 2023-08-03 DIAGNOSIS — H818X3 Other disorders of vestibular function, bilateral: Secondary | ICD-10-CM | POA: Diagnosis not present

## 2023-08-08 ENCOUNTER — Ambulatory Visit (INDEPENDENT_AMBULATORY_CARE_PROVIDER_SITE_OTHER): Payer: Medicare Other

## 2023-08-08 DIAGNOSIS — I442 Atrioventricular block, complete: Secondary | ICD-10-CM | POA: Diagnosis not present

## 2023-08-08 LAB — CUP PACEART REMOTE DEVICE CHECK
Battery Remaining Longevity: 34 mo
Battery Remaining Percentage: 26 %
Battery Voltage: 2.9 V
Brady Statistic RV Percent Paced: 99 %
Date Time Interrogation Session: 20250604021848
Implantable Lead Connection Status: 753985
Implantable Lead Implant Date: 20160217
Implantable Lead Location: 753860
Implantable Lead Model: 1948
Implantable Pulse Generator Implant Date: 20160217
Lead Channel Impedance Value: 540 Ohm
Lead Channel Pacing Threshold Amplitude: 0.75 V
Lead Channel Pacing Threshold Pulse Width: 0.4 ms
Lead Channel Sensing Intrinsic Amplitude: 11.2 mV
Lead Channel Setting Pacing Amplitude: 1 V
Lead Channel Setting Pacing Pulse Width: 0.4 ms
Lead Channel Setting Sensing Sensitivity: 2.5 mV
Pulse Gen Model: 2240
Pulse Gen Serial Number: 3050195

## 2023-08-13 ENCOUNTER — Ambulatory Visit: Payer: Self-pay | Admitting: Cardiology

## 2023-08-28 ENCOUNTER — Ambulatory Visit (INDEPENDENT_AMBULATORY_CARE_PROVIDER_SITE_OTHER)

## 2023-08-28 DIAGNOSIS — E538 Deficiency of other specified B group vitamins: Secondary | ICD-10-CM

## 2023-08-28 MED ORDER — CYANOCOBALAMIN 1000 MCG/ML IJ SOLN
1000.0000 ug | Freq: Once | INTRAMUSCULAR | Status: AC
  Administered 2023-08-28: 1000 ug via INTRAMUSCULAR

## 2023-08-28 NOTE — Progress Notes (Signed)
 Per orders of Dr. Eustaquio Boyden, injection of B-12 given by Melina Copa in left deltoid. Patient tolerated injection well. Patient will make appointment for 1 month.

## 2023-09-10 ENCOUNTER — Other Ambulatory Visit: Payer: Self-pay | Admitting: Family Medicine

## 2023-09-10 DIAGNOSIS — E785 Hyperlipidemia, unspecified: Secondary | ICD-10-CM

## 2023-09-10 NOTE — Telephone Encounter (Unsigned)
 Copied from CRM [id number]. Topic: Clinical - Medication Refill >> Sep 10, 2023  4:54 PM Abigail D wrote: Medication: rosuvastatin  (CRESTOR ) 20 MG tablet  Has the patient contacted their pharmacy? Yes (Agent: If no, request that the patient contact the pharmacy for the refill. If patient does not wish to contact the pharmacy document the reason why and proceed with request.) (Agent: If yes, when and what did the pharmacy advise?)  This is the patient's preferred pharmacy:  [facility] - [place], [place] - [name] [name] [address] Phone: [phone] Fax: [phone]  Is this the correct pharmacy for this prescription? Yes If no, delete pharmacy and type the correct one.   Has the prescription been filled recently? No  Is the patient out of the medication? Yes  Has the patient been seen for an appointment in the last year OR does the patient have an upcoming appointment? Yes  Can we respond through MyChart? No, call Sierra Kaiser  Agent: Please be advised that Rx refills may take up to 3 business days. We ask that you follow-up with your pharmacy.

## 2023-09-11 MED ORDER — ROSUVASTATIN CALCIUM 20 MG PO TABS: 20.0000 mg | ORAL_TABLET | ORAL | 2 refills | Status: AC

## 2023-09-19 DIAGNOSIS — R296 Repeated falls: Secondary | ICD-10-CM | POA: Diagnosis not present

## 2023-09-19 DIAGNOSIS — R454 Irritability and anger: Secondary | ICD-10-CM | POA: Diagnosis not present

## 2023-09-19 DIAGNOSIS — R413 Other amnesia: Secondary | ICD-10-CM | POA: Diagnosis not present

## 2023-09-19 DIAGNOSIS — R4586 Emotional lability: Secondary | ICD-10-CM | POA: Diagnosis not present

## 2023-09-19 DIAGNOSIS — R634 Abnormal weight loss: Secondary | ICD-10-CM | POA: Diagnosis not present

## 2023-09-19 DIAGNOSIS — R4189 Other symptoms and signs involving cognitive functions and awareness: Secondary | ICD-10-CM | POA: Diagnosis not present

## 2023-09-19 DIAGNOSIS — R6889 Other general symptoms and signs: Secondary | ICD-10-CM | POA: Diagnosis not present

## 2023-09-19 DIAGNOSIS — R630 Anorexia: Secondary | ICD-10-CM | POA: Diagnosis not present

## 2023-09-19 DIAGNOSIS — I482 Chronic atrial fibrillation, unspecified: Secondary | ICD-10-CM | POA: Diagnosis not present

## 2023-09-19 DIAGNOSIS — R2689 Other abnormalities of gait and mobility: Secondary | ICD-10-CM | POA: Diagnosis not present

## 2023-09-19 DIAGNOSIS — Z1331 Encounter for screening for depression: Secondary | ICD-10-CM | POA: Diagnosis not present

## 2023-09-27 ENCOUNTER — Ambulatory Visit (INDEPENDENT_AMBULATORY_CARE_PROVIDER_SITE_OTHER)

## 2023-09-27 ENCOUNTER — Encounter: Payer: Self-pay | Admitting: Family Medicine

## 2023-09-27 DIAGNOSIS — E538 Deficiency of other specified B group vitamins: Secondary | ICD-10-CM

## 2023-09-27 MED ORDER — CYANOCOBALAMIN 1000 MCG/ML IJ SOLN
1000.0000 ug | Freq: Once | INTRAMUSCULAR | Status: AC
  Administered 2023-09-27: 1000 ug via INTRAMUSCULAR

## 2023-09-27 NOTE — Progress Notes (Signed)
 Per orders of Lynwood Crandall, NP , injection of B-12 given by Danna CINDERELLA Hummer in right deltoid. Patient tolerated injection well. Patient will make appointment for 1 month.

## 2023-10-01 NOTE — Progress Notes (Signed)
 Remote pacemaker transmission.

## 2023-10-04 DIAGNOSIS — R42 Dizziness and giddiness: Secondary | ICD-10-CM | POA: Diagnosis not present

## 2023-10-06 LAB — LAB REPORT - SCANNED: EGFR: 45

## 2023-10-29 ENCOUNTER — Ambulatory Visit (INDEPENDENT_AMBULATORY_CARE_PROVIDER_SITE_OTHER): Admitting: Family Medicine

## 2023-10-29 ENCOUNTER — Encounter: Payer: Self-pay | Admitting: Family Medicine

## 2023-10-29 VITALS — BP 112/64 | HR 74 | Temp 97.7°F | Ht 67.5 in | Wt 118.2 lb

## 2023-10-29 DIAGNOSIS — E538 Deficiency of other specified B group vitamins: Secondary | ICD-10-CM

## 2023-10-29 DIAGNOSIS — H8109 Meniere's disease, unspecified ear: Secondary | ICD-10-CM | POA: Diagnosis not present

## 2023-10-29 DIAGNOSIS — I5032 Chronic diastolic (congestive) heart failure: Secondary | ICD-10-CM

## 2023-10-29 DIAGNOSIS — I951 Orthostatic hypotension: Secondary | ICD-10-CM

## 2023-10-29 DIAGNOSIS — G3184 Mild cognitive impairment, so stated: Secondary | ICD-10-CM

## 2023-10-29 DIAGNOSIS — R634 Abnormal weight loss: Secondary | ICD-10-CM

## 2023-10-29 MED ORDER — VITAMIN D3 25 MCG (1000 UT) PO CAPS: 1.0000 | ORAL_CAPSULE | Freq: Every day | ORAL | Status: AC

## 2023-10-29 MED ORDER — FLUDROCORTISONE ACETATE 0.1 MG PO TABS: 0.1000 mg | ORAL_TABLET | Freq: Every day | ORAL | 3 refills | Status: DC

## 2023-10-29 MED ORDER — CYANOCOBALAMIN 1000 MCG/ML IJ SOLN
1000.0000 ug | Freq: Once | INTRAMUSCULAR | Status: AC
  Administered 2023-10-29: 1000 ug via INTRAMUSCULAR

## 2023-10-29 NOTE — Patient Instructions (Addendum)
 Update B12 shot today  Take depakote 125mg  at night time.  Blood pressure check shows large drop in blood pressures with standing. Stop hydrochlorothiazide. Start fludrocortisone  in the mornings. Increase water intake, liberalize salt intake, start using compression stockings  Continue other medicines as per med list.  Return in 4-6 weeks for follow up visit.

## 2023-10-29 NOTE — Progress Notes (Unsigned)
 Ph: [phone] Fax: [phone]   Patient ID: Sierra Kaiser, female    DOB: [birth year], 89 y.o.   MRN: [id number]  This visit was conducted in person.  BP 112/64   Pulse 74   Temp 97.7 F (36.5 C) (Oral)   Ht 5' 7.5 (1.715 m)   Wt 118 lb 4 oz (53.6 kg)   SpO2 97%   BMI 18.25 kg/m   Orthostatic Vitals for the past 48 hrs (Last 6 readings):  Patient Position Orthostatic BP Orthostatic Pulse BP Pulse BP Location Cuff Size  10/29/23 1109 -- -- -- 112/64 74 -- --  10/29/23 1202 Supine 118/64 70 -- -- Left Arm Normal  10/29/23 1204 Standing 90/60 70 -- -- Left Arm Normal   CC:  3 mo f/u visit  Subjective:   HPI: Sierra Kaiser is a 89 y.o. female presenting on 10/29/2023 for Medical Management of Chronic Issues (Pt here for 3 mth f/u /Pt is accompanied by her sister Sierra Kaiser)   See prior note for details.  No longer driving. Sister Sierra Kaiser drove her here today.  Significant family stressors.   Continues monthly B12 shots, latest 09/27/2023.   H/o afib, HFpEF, heart block s/p PPM - sees afib clinic on xarelto  15mg  daily. Since last seen, has also seen ENT cardiology and [facility] - vascular surgery didn't recommend invasive intervention for known PAD.   Head CT and CT angio head/neck 05/2023 - reassuringly without findings for acute stroke but she did have multifocal severe stenosis of L vertebral artery V4 segment and 60% stenosed R ICA.    Chronic unsteadiness - saw [facility] 06/2023 - started hydrochlorothiazide 12.5mg  with some benefit. May be taking this at night - will change to am dosing.   Saw neurology Dr Lane 09/2023, note reviewed - started on depakote 125mg  bid for mood and appetite stimulation. She had trouble tolerating BID, has only been taking in am. Continues donepezil  10mg  nightly.  Previous poor effect to antidepressants tried (sertraline , lexapro).   10 lb weight loss since 07/2023, 16 lbs since 05/2023 - notes appetite is good, eating 3-4 meals a  day including snacks, but small portions.  No fevers/chills, denies any pains, blood in stool or urine, diarrhea, constipation, nausea/vomiting, headaches. No dysphagia or early satiety.  Non smoker No alcohol.      Relevant past medical, surgical, family and social history reviewed and updated as indicated. Interim medical history since our last visit reviewed. Allergies and medications reviewed and updated. Outpatient Medications Prior to Visit  Medication Sig Dispense Refill   Cetirizine HCl (ZYRTEC PO) Take by mouth.     cyanocobalamin  (,VITAMIN B-12,) 1000 MCG/ML injection Inject 1 mL (1,000 mcg total) into the muscle every 30 (thirty) days.     divalproex (DEPAKOTE) 125 MG DR tablet Take 1 tablet (125 mg total) by mouth at bedtime. Through neurology Dr Lane     donepezil  (ARICEPT ) 10 MG tablet Take 1 tablet (10 mg total) by mouth at bedtime. 90 tablet 1   ezetimibe  (ZETIA ) 10 MG tablet Take 1 tablet (10 mg total) by mouth daily. 90 tablet 4   fluticasone  (FLONASE ) 50 MCG/ACT nasal spray Place 2 sprays into both nostrils daily. 48 mL 4   furosemide  (LASIX ) 20 MG tablet TAKE 1 TABLET (20 MG TOTAL) BY MOUTH AS NEEDED FOR FLUID OR EDEMA. 30 tablet 3   meclizine  (ANTIVERT ) 25 MG tablet Take 0.5-1 tablets (12.5-25 mg total) by mouth 2 (two) times daily as  needed for dizziness. 30 tablet 0   ondansetron  (ZOFRAN ) 4 MG tablet Take 1 tablet (4 mg total) by mouth every 6 (six) hours as needed for nausea. 20 tablet 0   Polyethyl Glycol-Propyl Glycol (SYSTANE OP) Place 1 drop into both eyes daily as needed (dry eyes).     Rivaroxaban  (XARELTO ) 15 MG TABS tablet Take 1 tablet (15 mg total) by mouth daily with supper. 90 tablet 1   rosuvastatin  (CRESTOR ) 20 MG tablet Take 1 tablet (20 mg total) by mouth every other day. 45 tablet 2   Cholecalciferol (VITAMIN D3) 25 MCG (1000 UT) CAPS Take 2 capsules (2,000 Units total) by mouth daily.     hydrochlorothiazide (MICROZIDE) 12.5 MG capsule Take 1 capsule  (12.5 mg total) by mouth daily.     No facility-administered medications prior to visit.     Per HPI unless specifically indicated in ROS section below Review of Systems  Objective:  BP 112/64   Pulse 74   Temp 97.7 F (36.5 C) (Oral)   Ht 5' 7.5 (1.715 m)   Wt 118 lb 4 oz (53.6 kg)   SpO2 97%   BMI 18.25 kg/m   Wt Readings from Last 3 Encounters:  10/29/23 118 lb 4 oz (53.6 kg)  07/27/23 123 lb (55.8 kg)  07/10/23 128 lb 3.2 oz (58.2 kg)      Physical Exam Vitals and nursing note reviewed.  Constitutional:      Appearance: Normal appearance. She is not ill-appearing.  HENT:     Head: Normocephalic and atraumatic.     Mouth/Throat:     Mouth: Mucous membranes are moist.     Pharynx: Oropharynx is clear. No oropharyngeal exudate or posterior oropharyngeal erythema.  Eyes:     Extraocular Movements: Extraocular movements intact.     Pupils: Pupils are equal, round, and reactive to light.  Cardiovascular:     Rate and Rhythm: Normal rate and regular rhythm.     Pulses: Normal pulses.     Heart sounds: Normal heart sounds. No murmur heard. Pulmonary:     Effort: Pulmonary effort is normal. No respiratory distress.     Breath sounds: Normal breath sounds. No wheezing, rhonchi or rales.  Musculoskeletal:     Right lower leg: No edema.     Left lower leg: No edema.  Skin:    General: Skin is warm and dry.     Findings: No rash.  Neurological:     Mental Status: She is alert.  Psychiatric:        Behavior: Behavior normal.       Results for orders placed or performed in visit on 10/09/23  Lab report - scanned   Collection Time: 10/06/23 12:15 PM  Result Value Ref Range   EGFR 45.0     Assessment & Plan:   Problem List Items Addressed This Visit     Low serum vitamin B12   Other Visit Diagnoses       Orthostatic hypotension    -  Primary        Meds ordered this encounter  Medications   Cholecalciferol (VITAMIN D3) 25 MCG (1000 UT) CAPS    Sig:  Take 1 capsule (1,000 Units total) by mouth daily.   fludrocortisone  (FLORINEF ) 0.1 MG tablet    Sig: Take 1 tablet (0.1 mg total) by mouth daily.    Dispense:  30 tablet    Refill:  3   cyanocobalamin  (VITAMIN B12) injection 1,000 mcg  No orders of the defined types were placed in this encounter.   Patient Instructions  Update B12 shot today  Take depakote 125mg  at night time.  Blood pressure check shows large drop in blood pressures with standing. Stop hydrochlorothiazide. Start fludrocortisone  in the mornings. Increase water intake, liberalize salt intake, start using compression stockings  Continue other medicines as per med list.  Return in 4-6 weeks for follow up visit.   Follow up plan: Return in about 6 weeks (around 12/10/2023) for follow up visit.  Anton Blas, MD

## 2023-10-30 DIAGNOSIS — I951 Orthostatic hypotension: Secondary | ICD-10-CM | POA: Insufficient documentation

## 2023-10-30 DIAGNOSIS — R634 Abnormal weight loss: Secondary | ICD-10-CM | POA: Insufficient documentation

## 2023-10-30 DIAGNOSIS — I959 Hypotension, unspecified: Secondary | ICD-10-CM | POA: Insufficient documentation

## 2023-10-30 NOTE — Assessment & Plan Note (Signed)
 Caution with new fludrocortisone  commencement, monitor for systolic heart failure with worsening pedal edema, shortness of breath.  Consider midodrine if this occurs.

## 2023-10-30 NOTE — Assessment & Plan Note (Addendum)
 Noted 10 pound weight loss over the past 3 months, 16 pounds since March. She endorses overall healthy appetite and regular meals. No other alarm symptoms present. Will continue to monitor this.

## 2023-10-30 NOTE — Assessment & Plan Note (Signed)
 Marked orthostasis with systolic drop of 28 pts from supine to standing.  This could explain drunk feeling when standing.  Stop hydrochlorothiazide , start fludrocortisone  0.1mg  daily in am. Discussed conservative measures of good hydration status, liberalizing salt intake, compression stocking use.  Consider midodrine pending effect.  Question other neurodegenerative process given associated memory trouble, appreciate neurology care. Return in 4 to 6 weeks for follow-up visit.

## 2023-10-30 NOTE — Assessment & Plan Note (Signed)
 Update B12 shot today.  Continue monthly B12 shots via nurse visits through our office.

## 2023-10-30 NOTE — Assessment & Plan Note (Addendum)
 Ongoing difficulty despite Aricept  10 mg nightly. Recently started on Depakote by neurology, but she had trouble tolerating twice daily dosing so is currently taking once daily. Encouraged she take at nighttime.

## 2023-10-30 NOTE — Assessment & Plan Note (Signed)
 Hydrochlorothiazide started after he saw ENT earlier this year, however with new orthostatic hypotension we will hold hydrochlorothiazide. She denies true vertigo, tinnitus, frank dizziness.

## 2023-11-07 ENCOUNTER — Ambulatory Visit (INDEPENDENT_AMBULATORY_CARE_PROVIDER_SITE_OTHER): Payer: Medicare Other

## 2023-11-07 DIAGNOSIS — I442 Atrioventricular block, complete: Secondary | ICD-10-CM | POA: Diagnosis not present

## 2023-11-08 LAB — CUP PACEART REMOTE DEVICE CHECK
Battery Remaining Longevity: 31 mo
Battery Remaining Percentage: 24 %
Battery Voltage: 2.9 V
Brady Statistic RV Percent Paced: 99 %
Date Time Interrogation Session: 20250903050425
Implantable Lead Connection Status: 753985
Implantable Lead Implant Date: 20160217
Implantable Lead Location: 753860
Implantable Lead Model: 1948
Implantable Pulse Generator Implant Date: 20160217
Lead Channel Impedance Value: 460 Ohm
Lead Channel Pacing Threshold Amplitude: 0.625 V
Lead Channel Pacing Threshold Pulse Width: 0.4 ms
Lead Channel Sensing Intrinsic Amplitude: 7.7 mV
Lead Channel Setting Pacing Amplitude: 0.875
Lead Channel Setting Pacing Pulse Width: 0.4 ms
Lead Channel Setting Sensing Sensitivity: 2.5 mV
Pulse Gen Model: 2240
Pulse Gen Serial Number: 3050195

## 2023-11-13 DIAGNOSIS — R0781 Pleurodynia: Secondary | ICD-10-CM | POA: Diagnosis not present

## 2023-11-13 DIAGNOSIS — S299XXA Unspecified injury of thorax, initial encounter: Secondary | ICD-10-CM | POA: Diagnosis not present

## 2023-11-14 NOTE — Progress Notes (Signed)
 Remote PPM Transmission

## 2023-11-19 DIAGNOSIS — R634 Abnormal weight loss: Secondary | ICD-10-CM | POA: Diagnosis not present

## 2023-11-19 DIAGNOSIS — I951 Orthostatic hypotension: Secondary | ICD-10-CM | POA: Diagnosis not present

## 2023-11-19 DIAGNOSIS — R630 Anorexia: Secondary | ICD-10-CM | POA: Diagnosis not present

## 2023-11-19 DIAGNOSIS — R4586 Emotional lability: Secondary | ICD-10-CM | POA: Diagnosis not present

## 2023-11-19 DIAGNOSIS — R413 Other amnesia: Secondary | ICD-10-CM | POA: Diagnosis not present

## 2023-11-19 DIAGNOSIS — R454 Irritability and anger: Secondary | ICD-10-CM | POA: Diagnosis not present

## 2023-11-19 DIAGNOSIS — R2689 Other abnormalities of gait and mobility: Secondary | ICD-10-CM | POA: Diagnosis not present

## 2023-11-19 DIAGNOSIS — R4189 Other symptoms and signs involving cognitive functions and awareness: Secondary | ICD-10-CM | POA: Diagnosis not present

## 2023-11-25 ENCOUNTER — Ambulatory Visit: Payer: Self-pay | Admitting: Cardiology

## 2023-11-26 ENCOUNTER — Other Ambulatory Visit: Payer: Self-pay | Admitting: Cardiovascular Disease

## 2023-11-26 ENCOUNTER — Ambulatory Visit: Admitting: Family Medicine

## 2023-11-26 NOTE — Telephone Encounter (Signed)
 Prescription refill request for Xarelto  received.  Indication:afib Last office visit:4/25 Weight:53.6  kg Age:89 Scr:1.17  8/25 CrCl:28.12  ml/min  Prescription refilled

## 2023-11-28 ENCOUNTER — Ambulatory Visit

## 2023-11-28 ENCOUNTER — Ambulatory Visit: Admitting: Family Medicine

## 2023-11-28 ENCOUNTER — Encounter: Payer: Self-pay | Admitting: Family Medicine

## 2023-11-28 VITALS — BP 130/62 | HR 70 | Temp 97.7°F | Ht 67.5 in | Wt 121.1 lb

## 2023-11-28 DIAGNOSIS — R451 Restlessness and agitation: Secondary | ICD-10-CM | POA: Diagnosis not present

## 2023-11-28 DIAGNOSIS — F419 Anxiety disorder, unspecified: Secondary | ICD-10-CM | POA: Diagnosis not present

## 2023-11-28 DIAGNOSIS — S80811A Abrasion, right lower leg, initial encounter: Secondary | ICD-10-CM | POA: Diagnosis not present

## 2023-11-28 DIAGNOSIS — E538 Deficiency of other specified B group vitamins: Secondary | ICD-10-CM | POA: Diagnosis not present

## 2023-11-28 DIAGNOSIS — I4821 Permanent atrial fibrillation: Secondary | ICD-10-CM | POA: Diagnosis not present

## 2023-11-28 DIAGNOSIS — R600 Localized edema: Secondary | ICD-10-CM | POA: Diagnosis not present

## 2023-11-28 DIAGNOSIS — G3184 Mild cognitive impairment, so stated: Secondary | ICD-10-CM

## 2023-11-28 DIAGNOSIS — I951 Orthostatic hypotension: Secondary | ICD-10-CM | POA: Diagnosis not present

## 2023-11-28 DIAGNOSIS — Z23 Encounter for immunization: Secondary | ICD-10-CM | POA: Diagnosis not present

## 2023-11-28 MED ORDER — FLUOXETINE HCL 10 MG PO CAPS: 10.0000 mg | ORAL_CAPSULE | Freq: Every day | ORAL | 6 refills | Status: AC

## 2023-11-28 MED ORDER — CEPHALEXIN 500 MG PO CAPS: 500.0000 mg | ORAL_CAPSULE | Freq: Three times a day (TID) | ORAL | 0 refills | Status: DC

## 2023-11-28 MED ORDER — CYANOCOBALAMIN 1000 MCG/ML IJ SOLN
1000.0000 ug | Freq: Once | INTRAMUSCULAR | Status: AC
  Administered 2023-11-28: 1000 ug via INTRAMUSCULAR

## 2023-11-28 NOTE — Patient Instructions (Addendum)
 Tetanus shot today (Td) B12 shot today  Take keflex  antibiotic for skin infection to right lower leg.   I think ankle and foot swelling may be coming from the fludrocortisone  - so hold for now.  Ok to continue midodrine and other medicines.   Start new medicine prozac  10mg  daily in the morning.  Return in 1 month for follow up

## 2023-11-28 NOTE — Progress Notes (Unsigned)
 Ph: [phone] Fax: [phone]   Patient ID: Sierra Kaiser Pounds, female    DOB: 1933-06-05, 89 y.o.   MRN: [id number]  This visit was conducted in person.  BP 130/62   Pulse 70   Temp 97.7 F (36.5 C) (Oral)   Ht 5' 7.5 (1.715 m)   Wt 121 lb 2 oz (54.9 kg)   SpO2 94%   BMI 18.69 kg/m    CC: discuss ankle swelling  Subjective:   HPI: Sierra Kaiser is a 89 y.o. female presenting on 11/28/2023 for Medical Management of Chronic Issues (Pt here for B12 and ankle swelling in both feet)   Continue monthly B12 shots, update today.   Orthostatic hypotension - saw neurology Dr Lane - started on midodrine 2.5mg  Qam. Depakote and aricept  and fludrocortisone  were continued.  Has noted ankle swelling edema developing over the past 3 days. No upper extremity swelling or swelling otherwise.  She took 4 lasix  pills yesterday.  Denies chest pain, dyspnea, orthopnea. Sleeps on 1 pillow at night.   Scratched R lower leg while lifting log a few weeks ago.   She talks about her nieces, one who passed away 15 yrs ago. She [place] talks about a job she did not get due to priortizing her family.      Relevant past medical, surgical, family and social history reviewed and updated as indicated. Interim medical history since our last visit reviewed. Allergies and medications reviewed and updated. Outpatient Medications Prior to Visit  Medication Sig Dispense Refill   Cetirizine HCl (ZYRTEC PO) Take by mouth.     Cholecalciferol (VITAMIN D3) 25 MCG (1000 UT) CAPS Take 1 capsule (1,000 Units total) by mouth daily.     cyanocobalamin  (,VITAMIN B-12,) 1000 MCG/ML injection Inject 1 mL (1,000 mcg total) into the muscle every 30 (thirty) days.     divalproex (DEPAKOTE) 125 MG DR tablet Take 1 tablet (125 mg total) by mouth at bedtime. Through neurology Dr Lane     donepezil  (ARICEPT ) 10 MG tablet Take 1 tablet (10 mg total) by mouth at bedtime. 90 tablet 1   ezetimibe  (ZETIA ) 10 MG tablet Take 1  tablet (10 mg total) by mouth daily. 90 tablet 4   fluticasone  (FLONASE ) 50 MCG/ACT nasal spray Place 2 sprays into both nostrils daily. 48 mL 4   furosemide  (LASIX ) 20 MG tablet TAKE 1 TABLET (20 MG TOTAL) BY MOUTH AS NEEDED FOR FLUID OR EDEMA. 30 tablet 3   meclizine  (ANTIVERT ) 25 MG tablet Take 0.5-1 tablets (12.5-25 mg total) by mouth 2 (two) times daily as needed for dizziness. 30 tablet 0   midodrine (PROAMATINE) 2.5 MG tablet Take 2.5 mg the morning. DO NOT LAY DOWN 4 hours after taking medication     ondansetron  (ZOFRAN ) 4 MG tablet Take 1 tablet (4 mg total) by mouth every 6 (six) hours as needed for nausea. 20 tablet 0   Polyethyl Glycol-Propyl Glycol (SYSTANE OP) Place 1 drop into both eyes daily as needed (dry eyes).     rosuvastatin  (CRESTOR ) 20 MG tablet Take 1 tablet (20 mg total) by mouth every other day. 45 tablet 2   XARELTO  15 MG TABS tablet TAKE 1 TABLET BY MOUTH ONCE DAILY WITH SUPPER 90 tablet 1   fludrocortisone  (FLORINEF ) 0.1 MG tablet Take 1 tablet (0.1 mg total) by mouth daily. 30 tablet 3   No facility-administered medications prior to visit.     Per HPI unless specifically indicated in ROS section below Review  of Systems  Objective:  BP 130/62   Pulse 70   Temp 97.7 F (36.5 C) (Oral)   Ht 5' 7.5 (1.715 m)   Wt 121 lb 2 oz (54.9 kg)   SpO2 94%   BMI 18.69 kg/m   Wt Readings from Last 3 Encounters:  11/28/23 121 lb 2 oz (54.9 kg)  10/29/23 118 lb 4 oz (53.6 kg)  07/27/23 123 lb (55.8 kg)      Physical Exam Vitals and nursing note reviewed.  Constitutional:      Appearance: Normal appearance. She is not ill-appearing.  HENT:     Mouth/Throat:     Mouth: Mucous membranes are moist.     Pharynx: Oropharynx is clear. No oropharyngeal exudate or posterior oropharyngeal erythema.  Eyes:     Extraocular Movements: Extraocular movements intact.     Pupils: Pupils are equal, round, and reactive to light.  Cardiovascular:     Rate and Rhythm: Normal rate  and regular rhythm.     Pulses: Normal pulses.     Heart sounds: Normal heart sounds. No murmur heard. Pulmonary:     Effort: Pulmonary effort is normal. No respiratory distress.     Breath sounds: Normal breath sounds. No wheezing, rhonchi or rales.  Musculoskeletal:     Right lower leg: Edema (1+ dorsal foot to shin) present.     Left lower leg: Edema (1+ dorsal foot to shin) present.  Skin:    General: Skin is warm and dry.     Findings: No rash.  Neurological:     Mental Status: She is alert.  Psychiatric:        Mood and Affect: Mood normal. Affect is angry (intermittently).       Results for orders placed or performed in visit on 11/07/23  CUP PACEART REMOTE DEVICE CHECK   Collection Time: 11/07/23  5:04 AM  Result Value Ref Range   Date Time Interrogation Session [id number]    Pulse Generator Manufacturer [employer]    Pulse Gen Model 2240 Assurity    Pulse Gen Serial Number [id number]    Clinic Name [facility]    Implantable Pulse Generator Type Implantable Pulse Generator    Implantable Pulse Generator Implant Date 79839782    Implantable Lead Manufacturer [facility]    Implantable Lead Model 1948 IsoFlex Optim    Implantable Lead Serial Number [id number]    Implantable Lead Implant Date 79839782    Implantable Lead Location Detail 1 UNKNOWN    Implantable Lead Location [id number]    Implantable Lead Connection Status [id number]    Lead Channel Setting Sensing Sensitivity 2.5 mV   Lead Channel Setting Sensing Adaptation Mode Fixed Pacing    Lead Channel Setting Pacing Pulse Width 0.4 ms   Lead Channel Setting Pacing Amplitude 0.875    Lead Channel Status NULL    Lead Channel Impedance Value 460 ohm   Lead Channel Sensing Intrinsic Amplitude 7.7 mV   Lead Channel Pacing Threshold Amplitude 0.625 V   Lead Channel Pacing Threshold Pulse Width 0.4 ms   Battery Status MOS    Battery Remaining Longevity 31 mo   Battery Remaining Percentage 24.0 %   Battery Voltage 2.9 V   Brady  Statistic RV Percent Paced 99.0 %    Assessment & Plan:   Problem List Items Addressed This Visit     Low serum vitamin B12   Other Visit Diagnoses       Scratch of lower leg, right, initial encounter    -  Primary   Relevant Orders   Td : Tetanus/diphtheria >7yo Preservative  free        Meds ordered this encounter  Medications   cephALEXin  (KEFLEX ) 500 MG capsule    Sig: Take 1 capsule (500 mg total) by mouth 3 (three) times daily.    Dispense:  15 capsule    Refill:  0   FLUoxetine  (PROZAC ) 10 MG capsule    Sig: Take 1 capsule (10 mg total) by mouth daily.    Dispense:  30 capsule    Refill:  6   cyanocobalamin  (VITAMIN B12) injection 1,000 mcg    Orders Placed This Encounter  Procedures   Td : Tetanus/diphtheria >7yo Preservative  free    Patient Instructions  Tetanus shot today (Td) B12 shot today  Take keflex  antibiotic for skin infection to right lower leg.   I think ankle and foot swelling may be coming from the fludrocortisone  - so hold for now.  Ok to continue midodrine and other medicines.   Start new medicine prozac  10mg  daily in the morning.  Return in 1 month for follow up  Follow up plan: Return in about 1 month (around 12/28/2023) for follow up visit.  Anton Blas, MD

## 2023-11-30 DIAGNOSIS — R600 Localized edema: Secondary | ICD-10-CM | POA: Insufficient documentation

## 2023-11-30 DIAGNOSIS — S80819A Abrasion, unspecified lower leg, initial encounter: Secondary | ICD-10-CM | POA: Insufficient documentation

## 2023-11-30 NOTE — Assessment & Plan Note (Signed)
 Slowly healing wound with surrounding erythema concerning for infected wound.  Rx keflex  antibiotic course to cover possible infection Update Td (tetanus) today

## 2023-11-30 NOTE — Assessment & Plan Note (Addendum)
 Update b12 shot today, continue monthly injections.

## 2023-11-30 NOTE — Assessment & Plan Note (Signed)
 Fludrocortisone  may be causing pedal edema - will stop at this time. Neuro recently started midodrine 2.5mg  daily - continue this.  Continue to encourage liberalized salt/sodium in diet, as well as compression stockings.

## 2023-11-30 NOTE — Assessment & Plan Note (Signed)
 Ongoing difficulty with component of cogntive impairment. Previous SSRIs tried with poor effect (sertraline , lexapro). Will try prozac  10mg  daily, caution with insomnia, consider trial of cymbalta.  Continue aricept  10mg  nightly.

## 2023-11-30 NOTE — Assessment & Plan Note (Addendum)
 Noted in the past few days, concern it could be due to side effect of fludrocortisone  - will stop this.  No other symptoms suggestive of CHF exacerbation.  Rec leg elevation.

## 2023-11-30 NOTE — Assessment & Plan Note (Signed)
 Continue aricept  10mg  nightly. Neurology started depakote, only tolerating once daily dosing.

## 2023-11-30 NOTE — Assessment & Plan Note (Signed)
 Continue xarelto

## 2023-12-28 ENCOUNTER — Other Ambulatory Visit: Payer: Self-pay

## 2023-12-28 ENCOUNTER — Inpatient Hospital Stay
Admission: EM | Admit: 2023-12-28 | Discharge: 2024-01-02 | DRG: 562 | Disposition: A | Discharge status: Skilled Nursing Facility

## 2023-12-28 ENCOUNTER — Emergency Department

## 2023-12-28 DIAGNOSIS — Z23 Encounter for immunization: Secondary | ICD-10-CM | POA: Diagnosis present

## 2023-12-28 DIAGNOSIS — S8700XA Crushing injury of unspecified knee, initial encounter: Secondary | ICD-10-CM | POA: Diagnosis not present

## 2023-12-28 DIAGNOSIS — Z91041 Radiographic dye allergy status: Secondary | ICD-10-CM | POA: Diagnosis not present

## 2023-12-28 DIAGNOSIS — K219 Gastro-esophageal reflux disease without esophagitis: Secondary | ICD-10-CM | POA: Diagnosis not present

## 2023-12-28 DIAGNOSIS — Z7401 Bed confinement status: Secondary | ICD-10-CM | POA: Diagnosis not present

## 2023-12-28 DIAGNOSIS — I4821 Permanent atrial fibrillation: Secondary | ICD-10-CM | POA: Diagnosis not present

## 2023-12-28 DIAGNOSIS — F0393 Unspecified dementia, unspecified severity, with mood disturbance: Secondary | ICD-10-CM | POA: Diagnosis not present

## 2023-12-28 DIAGNOSIS — N1831 Chronic kidney disease, stage 3a: Secondary | ICD-10-CM | POA: Diagnosis present

## 2023-12-28 DIAGNOSIS — M25512 Pain in left shoulder: Secondary | ICD-10-CM | POA: Diagnosis not present

## 2023-12-28 DIAGNOSIS — E785 Hyperlipidemia, unspecified: Secondary | ICD-10-CM | POA: Diagnosis present

## 2023-12-28 DIAGNOSIS — Z8711 Personal history of peptic ulcer disease: Secondary | ICD-10-CM | POA: Diagnosis not present

## 2023-12-28 DIAGNOSIS — S82141A Displaced bicondylar fracture of right tibia, initial encounter for closed fracture: Secondary | ICD-10-CM | POA: Diagnosis not present

## 2023-12-28 DIAGNOSIS — Z883 Allergy status to other anti-infective agents status: Secondary | ICD-10-CM

## 2023-12-28 DIAGNOSIS — I442 Atrioventricular block, complete: Secondary | ICD-10-CM | POA: Diagnosis not present

## 2023-12-28 DIAGNOSIS — S82101A Unspecified fracture of upper end of right tibia, initial encounter for closed fracture: Secondary | ICD-10-CM | POA: Diagnosis not present

## 2023-12-28 DIAGNOSIS — F0390 Unspecified dementia without behavioral disturbance: Secondary | ICD-10-CM | POA: Diagnosis not present

## 2023-12-28 DIAGNOSIS — I5032 Chronic diastolic (congestive) heart failure: Secondary | ICD-10-CM | POA: Diagnosis not present

## 2023-12-28 DIAGNOSIS — Z811 Family history of alcohol abuse and dependence: Secondary | ICD-10-CM | POA: Diagnosis not present

## 2023-12-28 DIAGNOSIS — Z9049 Acquired absence of other specified parts of digestive tract: Secondary | ICD-10-CM

## 2023-12-28 DIAGNOSIS — R410 Disorientation, unspecified: Secondary | ICD-10-CM | POA: Insufficient documentation

## 2023-12-28 DIAGNOSIS — Z79899 Other long term (current) drug therapy: Secondary | ICD-10-CM

## 2023-12-28 DIAGNOSIS — S82251A Displaced comminuted fracture of shaft of right tibia, initial encounter for closed fracture: Secondary | ICD-10-CM | POA: Diagnosis not present

## 2023-12-28 DIAGNOSIS — Z90710 Acquired absence of both cervix and uterus: Secondary | ICD-10-CM

## 2023-12-28 DIAGNOSIS — Y92008 Other place in unspecified non-institutional (private) residence as the place of occurrence of the external cause: Secondary | ICD-10-CM

## 2023-12-28 DIAGNOSIS — F32A Depression, unspecified: Secondary | ICD-10-CM | POA: Diagnosis present

## 2023-12-28 DIAGNOSIS — Z888 Allergy status to other drugs, medicaments and biological substances status: Secondary | ICD-10-CM | POA: Diagnosis not present

## 2023-12-28 DIAGNOSIS — B9620 Unspecified Escherichia coli [E. coli] as the cause of diseases classified elsewhere: Secondary | ICD-10-CM | POA: Diagnosis not present

## 2023-12-28 DIAGNOSIS — Z833 Family history of diabetes mellitus: Secondary | ICD-10-CM | POA: Diagnosis not present

## 2023-12-28 DIAGNOSIS — G9341 Metabolic encephalopathy: Secondary | ICD-10-CM | POA: Diagnosis not present

## 2023-12-28 DIAGNOSIS — I9589 Other hypotension: Secondary | ICD-10-CM | POA: Diagnosis not present

## 2023-12-28 DIAGNOSIS — S82201A Unspecified fracture of shaft of right tibia, initial encounter for closed fracture: Secondary | ICD-10-CM | POA: Diagnosis not present

## 2023-12-28 DIAGNOSIS — E559 Vitamin D deficiency, unspecified: Secondary | ICD-10-CM | POA: Diagnosis not present

## 2023-12-28 DIAGNOSIS — I130 Hypertensive heart and chronic kidney disease with heart failure and stage 1 through stage 4 chronic kidney disease, or unspecified chronic kidney disease: Secondary | ICD-10-CM | POA: Diagnosis not present

## 2023-12-28 DIAGNOSIS — R41841 Cognitive communication deficit: Secondary | ICD-10-CM | POA: Diagnosis not present

## 2023-12-28 DIAGNOSIS — S82101G Unspecified fracture of upper end of right tibia, subsequent encounter for closed fracture with delayed healing: Secondary | ICD-10-CM | POA: Diagnosis not present

## 2023-12-28 DIAGNOSIS — N3000 Acute cystitis without hematuria: Secondary | ICD-10-CM | POA: Diagnosis present

## 2023-12-28 DIAGNOSIS — W11XXXA Fall on and from ladder, initial encounter: Secondary | ICD-10-CM | POA: Diagnosis present

## 2023-12-28 DIAGNOSIS — S82201D Unspecified fracture of shaft of right tibia, subsequent encounter for closed fracture with routine healing: Secondary | ICD-10-CM | POA: Diagnosis not present

## 2023-12-28 DIAGNOSIS — W19XXXA Unspecified fall, initial encounter: Secondary | ICD-10-CM | POA: Diagnosis not present

## 2023-12-28 DIAGNOSIS — Y92009 Unspecified place in unspecified non-institutional (private) residence as the place of occurrence of the external cause: Secondary | ICD-10-CM

## 2023-12-28 DIAGNOSIS — I951 Orthostatic hypotension: Secondary | ICD-10-CM | POA: Diagnosis present

## 2023-12-28 DIAGNOSIS — Z7901 Long term (current) use of anticoagulants: Secondary | ICD-10-CM | POA: Diagnosis not present

## 2023-12-28 DIAGNOSIS — D696 Thrombocytopenia, unspecified: Secondary | ICD-10-CM | POA: Diagnosis present

## 2023-12-28 DIAGNOSIS — S82101S Unspecified fracture of upper end of right tibia, sequela: Secondary | ICD-10-CM | POA: Diagnosis not present

## 2023-12-28 DIAGNOSIS — Z66 Do not resuscitate: Secondary | ICD-10-CM | POA: Diagnosis not present

## 2023-12-28 DIAGNOSIS — I959 Hypotension, unspecified: Secondary | ICD-10-CM | POA: Diagnosis present

## 2023-12-28 DIAGNOSIS — Z823 Family history of stroke: Secondary | ICD-10-CM

## 2023-12-28 DIAGNOSIS — Z950 Presence of cardiac pacemaker: Secondary | ICD-10-CM | POA: Diagnosis not present

## 2023-12-28 DIAGNOSIS — R2681 Unsteadiness on feet: Secondary | ICD-10-CM | POA: Diagnosis not present

## 2023-12-28 DIAGNOSIS — I2510 Atherosclerotic heart disease of native coronary artery without angina pectoris: Secondary | ICD-10-CM | POA: Diagnosis present

## 2023-12-28 DIAGNOSIS — Z8249 Family history of ischemic heart disease and other diseases of the circulatory system: Secondary | ICD-10-CM | POA: Diagnosis not present

## 2023-12-28 DIAGNOSIS — M85861 Other specified disorders of bone density and structure, right lower leg: Secondary | ICD-10-CM | POA: Diagnosis not present

## 2023-12-28 DIAGNOSIS — F3289 Other specified depressive episodes: Secondary | ICD-10-CM | POA: Diagnosis not present

## 2023-12-28 DIAGNOSIS — S82101D Unspecified fracture of upper end of right tibia, subsequent encounter for closed fracture with routine healing: Secondary | ICD-10-CM | POA: Diagnosis not present

## 2023-12-28 DIAGNOSIS — M6259 Muscle wasting and atrophy, not elsewhere classified, multiple sites: Secondary | ICD-10-CM | POA: Diagnosis not present

## 2023-12-28 DIAGNOSIS — R609 Edema, unspecified: Secondary | ICD-10-CM | POA: Diagnosis not present

## 2023-12-28 DIAGNOSIS — I7300 Raynaud's syndrome without gangrene: Secondary | ICD-10-CM | POA: Diagnosis present

## 2023-12-28 DIAGNOSIS — I4891 Unspecified atrial fibrillation: Secondary | ICD-10-CM | POA: Diagnosis not present

## 2023-12-28 DIAGNOSIS — Z803 Family history of malignant neoplasm of breast: Secondary | ICD-10-CM

## 2023-12-28 LAB — CBC WITH DIFFERENTIAL/PLATELET
Abs Immature Granulocytes: 0.04 K/uL (ref 0.00–0.07)
Basophils Absolute: 0 K/uL (ref 0.0–0.1)
Basophils Relative: 1 %
Eosinophils Absolute: 0.1 K/uL (ref 0.0–0.5)
Eosinophils Relative: 1 %
HCT: 32.5 % — ABNORMAL LOW (ref 36.0–46.0)
Hemoglobin: 11.2 g/dL — ABNORMAL LOW (ref 12.0–15.0)
Immature Granulocytes: 1 %
Lymphocytes Relative: 16 %
Lymphs Abs: 1 K/uL (ref 0.7–4.0)
MCH: 32.7 pg (ref 26.0–34.0)
MCHC: 34.5 g/dL (ref 30.0–36.0)
MCV: 94.8 fL (ref 80.0–100.0)
Monocytes Absolute: 0.4 K/uL (ref 0.1–1.0)
Monocytes Relative: 7 %
Neutro Abs: 4.7 K/uL (ref 1.7–7.7)
Neutrophils Relative %: 74 %
Platelets: 142 K/uL — ABNORMAL LOW (ref 150–400)
RBC: 3.43 MIL/uL — ABNORMAL LOW (ref 3.87–5.11)
RDW: 13 % (ref 11.5–15.5)
WBC: 6.2 K/uL (ref 4.0–10.5)
nRBC: 0 % (ref 0.0–0.2)

## 2023-12-28 LAB — BASIC METABOLIC PANEL WITH GFR
Anion gap: 15 (ref 5–15)
BUN: 17 mg/dL (ref 8–23)
CO2: 18 mmol/L — ABNORMAL LOW (ref 22–32)
Calcium: 8.8 mg/dL — ABNORMAL LOW (ref 8.9–10.3)
Chloride: 109 mmol/L (ref 98–111)
Creatinine, Ser: 1.05 mg/dL — ABNORMAL HIGH (ref 0.44–1.00)
GFR, Estimated: 51 mL/min — ABNORMAL LOW (ref >60)
Glucose, Bld: 91 mg/dL (ref 70–99)
Potassium: 3.8 mmol/L (ref 3.5–5.1)
Sodium: 142 mmol/L (ref 135–145)

## 2023-12-28 MED ORDER — CETIRIZINE HCL 10 MG PO TABS: 5.0000 mg | ORAL_TABLET | Freq: Every day | ORAL | Status: DC

## 2023-12-28 MED ORDER — SODIUM CHLORIDE 0.9 % IV SOLN: INTRAVENOUS | Status: DC

## 2023-12-28 MED ORDER — MECLIZINE HCL 25 MG PO TABS: 12.5000 mg | ORAL_TABLET | Freq: Two times a day (BID) | ORAL | Status: DC | PRN

## 2023-12-28 MED ORDER — FLUTICASONE PROPIONATE 50 MCG/ACT NA SUSP
2.0000 | Freq: Every day | NASAL | Status: DC
  Administered 2023-12-29 – 2024-01-02 (×5): 2 via NASAL
  Filled 2023-12-28 (×2): qty 16

## 2023-12-28 MED ORDER — MORPHINE SULFATE (PF) 4 MG/ML IV SOLN
4.0000 mg | Freq: Once | INTRAVENOUS | Status: AC
  Administered 2023-12-28: 4 mg via INTRAVENOUS
  Filled 2023-12-28: qty 1

## 2023-12-28 MED ORDER — ACETAMINOPHEN 650 MG RE SUPP: 650.0000 mg | Freq: Four times a day (QID) | RECTAL | Status: DC | PRN

## 2023-12-28 MED ORDER — EZETIMIBE 10 MG PO TABS
10.0000 mg | ORAL_TABLET | Freq: Every day | ORAL | Status: DC
  Administered 2023-12-29 – 2024-01-02 (×5): 10 mg via ORAL
  Filled 2023-12-28 (×5): qty 1

## 2023-12-28 MED ORDER — OXYCODONE HCL 5 MG PO TABS
5.0000 mg | ORAL_TABLET | Freq: Once | ORAL | Status: AC
  Administered 2023-12-28: 5 mg via ORAL
  Filled 2023-12-28: qty 1

## 2023-12-28 MED ORDER — FLUOXETINE HCL 10 MG PO CAPS
10.0000 mg | ORAL_CAPSULE | Freq: Every day | ORAL | Status: DC
  Administered 2023-12-29 – 2024-01-02 (×5): 10 mg via ORAL
  Filled 2023-12-28 (×6): qty 1

## 2023-12-28 MED ORDER — VITAMIN D 25 MCG (1000 UNIT) PO TABS
1000.0000 [IU] | ORAL_TABLET | Freq: Every day | ORAL | Status: DC
  Administered 2023-12-29 – 2024-01-02 (×5): 1000 [IU] via ORAL
  Filled 2023-12-28 (×5): qty 1

## 2023-12-28 MED ORDER — MIDODRINE HCL 5 MG PO TABS
2.5000 mg | ORAL_TABLET | Freq: Every morning | ORAL | Status: DC
  Administered 2023-12-29 – 2024-01-02 (×4): 2.5 mg via ORAL
  Filled 2023-12-28 (×5): qty 1

## 2023-12-28 MED ORDER — DONEPEZIL HCL 5 MG PO TABS
10.0000 mg | ORAL_TABLET | Freq: Every day | ORAL | Status: DC
  Administered 2023-12-28 – 2024-01-01 (×5): 10 mg via ORAL
  Filled 2023-12-28 (×5): qty 2

## 2023-12-28 MED ORDER — ACETAMINOPHEN 325 MG PO TABS: 650.0000 mg | ORAL_TABLET | Freq: Four times a day (QID) | ORAL | Status: DC | PRN

## 2023-12-28 MED ORDER — ENOXAPARIN SODIUM 40 MG/0.4ML IJ SOSY
40.0000 mg | PREFILLED_SYRINGE | INTRAMUSCULAR | Status: DC
  Administered 2023-12-28 – 2024-01-01 (×5): 40 mg via SUBCUTANEOUS
  Filled 2023-12-28 (×5): qty 0.4

## 2023-12-28 MED ORDER — TRAZODONE HCL 50 MG PO TABS
25.0000 mg | ORAL_TABLET | Freq: Every evening | ORAL | Status: DC | PRN
  Administered 2023-12-30 – 2024-01-01 (×3): 25 mg via ORAL
  Filled 2023-12-28 (×3): qty 1

## 2023-12-28 MED ORDER — LORATADINE 10 MG PO TABS
5.0000 mg | ORAL_TABLET | Freq: Every day | ORAL | Status: DC
  Administered 2023-12-29 – 2024-01-02 (×5): 5 mg via ORAL
  Filled 2023-12-28 (×5): qty 1

## 2023-12-28 MED ORDER — DIVALPROEX SODIUM 125 MG PO DR TAB
125.0000 mg | DELAYED_RELEASE_TABLET | Freq: Every day | ORAL | Status: DC
  Administered 2023-12-29 – 2024-01-01 (×4): 125 mg via ORAL
  Filled 2023-12-28 (×4): qty 1

## 2023-12-28 MED ORDER — MAGNESIUM HYDROXIDE 400 MG/5ML PO SUSP
30.0000 mL | Freq: Every day | ORAL | Status: DC | PRN
  Administered 2023-12-31: 30 mL via ORAL
  Filled 2023-12-28: qty 30

## 2023-12-28 MED ORDER — ONDANSETRON HCL 4 MG PO TABS: 4.0000 mg | ORAL_TABLET | Freq: Four times a day (QID) | ORAL | Status: DC | PRN

## 2023-12-28 MED ORDER — ONDANSETRON HCL 4 MG/2ML IJ SOLN: 4.0000 mg | Freq: Four times a day (QID) | INTRAMUSCULAR | Status: DC | PRN

## 2023-12-28 MED ORDER — MORPHINE SULFATE (PF) 2 MG/ML IV SOLN
2.0000 mg | INTRAVENOUS | Status: DC | PRN
  Administered 2023-12-29 (×2): 2 mg via INTRAVENOUS
  Filled 2023-12-28 (×2): qty 1

## 2023-12-28 MED ORDER — ROSUVASTATIN CALCIUM 20 MG PO TABS
20.0000 mg | ORAL_TABLET | ORAL | Status: DC
  Administered 2023-12-29 – 2024-01-02 (×3): 20 mg via ORAL
  Filled 2023-12-28 (×5): qty 1

## 2023-12-28 NOTE — Assessment & Plan Note (Addendum)
 Orthopedic surgery reviewed the films and recommended nonoperative management knee brace and nonweightbearing right lower extremity.  PT and OT consultations.  PT recommending rehab.  TOC will have to speak with patient again about rehab.

## 2023-12-28 NOTE — ED Notes (Signed)
 Purewick placed on patient.

## 2023-12-28 NOTE — Assessment & Plan Note (Addendum)
On Zetia and Crestor.

## 2023-12-28 NOTE — H&P (Signed)
 [facility]   PATIENT NAME: Sierra Kaiser    MR#:  [id number]  DATE OF BIRTH:  04-Jun-1933  DATE OF ADMISSION:  12/28/2023  PRIMARY CARE PHYSICIAN: Rilla Baller, MD   Patient is coming from: Home  REQUESTING/REFERRING PHYSICIAN: Willo Dunnings, MD  CHIEF COMPLAINT:   Chief Complaint  Patient presents with   Fall    HISTORY OF PRESENT ILLNESS:  Sierra Kaiser is a 89 y.o. female with medical history significant for hypertension, dyslipidemia, coronary artery disease, CHF, GERD, diverticulosis, PUD and Raynaud's disease, who presented to the emergency room with acute onset of accidental mechanical fall off of 3 steps of a ladder when she lost balance.  She denied any presyncope or syncope.  No head injuries.  No paresthesias or focal muscle weakness.  No tinnitus or vertigo she fell on her right knee.  No fever or chills.  No cough or wheezing or dyspnea.  No dysuria, oliguria or hematuria or flank pain.  ED Course: When she came to the ER, temperature was 96.6 and BP was 102/83 and later 148 /72.  Labs revealed mild anemia with hemoglobin 11.2 hematocrit 32.5 and no thrombocytopenia 142 with unremarkable BMP. EKG as reviewed by me : None. Imaging: 4 view right knee x-ray showed minimally displaced comminuted fracture of the proximal tibia extending to the tibial plateau.  The patient was given 4 mg of IV morphine  sulfate and 5 mg of p.o. oxycodone IR.  Dr. Tobie was notified about the patient and recommended right knee immobilizer.  She will be admitted to a medical-surgical bed for further evaluation and management. PAST MEDICAL HISTORY:   Past Medical History:  Diagnosis Date   Anxiety    CAD (coronary artery disease)    CHF (congestive heart failure) (HCC)    Esophageal reflux    GI bleeding 2012   during EGD necessitating open surgery   Hiatal hernia    History of blood transfusion    History of diverticulitis    History of UTI    HTN (hypertension)     Hyperlipidemia    Meniere's disease 1970s   Mitral regurgitation    Osteopenia 12/11/2015   T score -1.2 femur, -2.0 spine (12/2015)   Permanent atrial fibrillation (HCC)    a. permanent b. s/p PVI RFA at [facility] 10/14 c. failed amio (neuro toxicity) and Tikosyn d. 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    PUD (peptic ulcer disease)    Raynaud's disease    Scalp laceration, subsequent encounter 07/10/2022   SNHL (sensorineural hearing loss)    right ear   Syncopal episodes    Tinnitus    Tinnitus    Ulcerative colitis (HCC)    per prior records   Urinary incontinence, urge     PAST SURGICAL HISTORY:   Past Surgical History:  Procedure Laterality Date   APPENDECTOMY  1973   AV NODE ABLATION N/A 05/06/2014   Procedure: AV NODE ABLATION;  Surgeon: Elspeth JAYSON Sage, MD;  Location: [facility] CATH LAB;  Service: Cardiovascular;  Laterality: N/A;   CARDIAC CATHETERIZATION  2014   [facility]   CARDIAC ELECTROPHYSIOLOGY STUDY AND ABLATION     CHOLECYSTECTOMY  2010   COLONOSCOPY  2010   polyps   COLONOSCOPY WITH PROPOFOL  N/A 08/29/2017   TAs, no f/u recommended Emil, Teodoro K, MD)   ESOPHAGOGASTRODUODENOSCOPY (EGD) WITH PROPOFOL  N/A 08/29/2017   benign biopsies Emil, Teodoro K, MD)   HEMORRHOID SURGERY  LAPAROTOMY  2012   EGD biopsy led to bleeding - needed laparotomy to stop bleed ([facility]ud[facility])   MOHS SURGERY     back   PERMANENT PACEMAKER INSERTION N/A 04/22/2014   STJ single chamber pacemaker implanted by Dr Fernande   TRANSESOPHAGEAL ECHOCARDIOGRAM WITH CARDIOVERSION  2014   [facility]   UPPER GI ENDOSCOPY  2012   with polypectomy   VAGINAL HYSTERECTOMY  1973   elective; ovaries remained    SOCIAL HISTORY:   Social History   Tobacco Use   Smoking status: Never   Smokeless tobacco: Never  Substance Use Topics   Alcohol use: Yes    Alcohol/week: 1.0 standard drink of alcohol    Types: 1 Glasses of wine per week     Comment: 1-2 glasses wine a month    FAMILY HISTORY:   Family History  Problem Relation Age of Onset   Arrhythmia Mother    Hypertension Mother    Diabetes Mother    Heart failure Brother    Heart failure Brother    Cancer Brother        colon (with colostomy)   Breast cancer Sister 57   Stroke Sister    Alcohol abuse Brother    Stroke Brother    Diabetes Son     DRUG ALLERGIES:   Allergies  Allergen Reactions   Gadolinium Derivatives Itching and Rash    Persisted for months   Contrast Media [Iodinated Contrast Media] Hives        Gabapentin Other (See Comments)    Affected mood bad   Metronidazole Itching and Other (See Comments)    Hands really red   Other    Sertraline  Other (See Comments)    tremors   Diltiazem  Hcl Itching and Rash    REVIEW OF SYSTEMS:   ROS As per history of present illness. All pertinent systems were reviewed above. Constitutional, HEENT, cardiovascular, respiratory, GI, GU, musculoskeletal, neuro, psychiatric, endocrine, integumentary and hematologic systems were reviewed and are otherwise negative/unremarkable except for positive findings mentioned above in the HPI.   MEDICATIONS AT HOME:   Prior to Admission medications   Medication Sig Start Date End Date Taking? Authorizing Provider  Cetirizine HCl (ZYRTEC PO) Take by mouth.    [provider]  Cholecalciferol (VITAMIN D3) 25 MCG (1000 UT) CAPS Take 1 capsule (1,000 Units total) by mouth daily. 10/29/23   Rilla Baller, MD  divalproex (DEPAKOTE) 125 MG DR tablet Take 1 tablet (125 mg total) by mouth at bedtime. Through neurology Dr Lane 10/29/23   Rilla Baller, MD  donepezil  (ARICEPT ) 10 MG tablet Take 1 tablet (10 mg total) by mouth at bedtime. 07/27/23   Rilla Baller, MD  ezetimibe  (ZETIA ) 10 MG tablet Take 1 tablet (10 mg total) by mouth daily. 04/17/23   Rilla Baller, MD  FLUoxetine  (PROZAC ) 10 MG capsule Take 1 capsule (10 mg total) by mouth daily.  11/28/23   Rilla Baller, MD  fluticasone  (FLONASE ) 50 MCG/ACT nasal spray Place 2 sprays into both nostrils daily. 04/17/23   Rilla Baller, MD  meclizine  (ANTIVERT ) 25 MG tablet Take 0.5-1 tablets (12.5-25 mg total) by mouth 2 (two) times daily as needed for dizziness. 05/22/23   Rilla Baller, MD  midodrine (PROAMATINE) 2.5 MG tablet Take 2.5 mg the morning. DO NOT LAY DOWN 4 hours after taking medication 11/19/23   [provider]  rosuvastatin  (CRESTOR ) 20 MG tablet Take 1 tablet (20 mg total) by mouth every  other day. 09/11/23   Rilla Baller, MD  XARELTO  15 MG TABS tablet TAKE 1 TABLET BY MOUTH ONCE DAILY WITH SUPPER 11/26/23   Gollan, Timothy J, MD      VITAL SIGNS:  Blood pressure (!) 132/59, pulse 70, temperature 97.9 F (36.6 C), temperature source Oral, resp. rate 18, height 5' 7 (1.702 m), weight 55 kg, SpO2 100%.  PHYSICAL EXAMINATION:  Physical Exam  GENERAL:  89 y.o.-year-old Caucasian female patient lying in the bed with no acute distress.  EYES: Pupils equal, round, reactive to light and accommodation. No scleral icterus. Extraocular muscles intact.  HEENT: Head atraumatic, normocephalic. Oropharynx and nasopharynx clear.  NECK:  Supple, no jugular venous distention. No thyroid  enlargement, no tenderness.  LUNGS: Normal breath sounds bilaterally, no wheezing, rales,rhonchi or crepitation. No use of accessory muscles of respiration.  CARDIOVASCULAR: Regular rate and rhythm, S1, S2 normal. No murmurs, rubs, or gallops.  ABDOMEN: Soft, nondistended, nontender. Bowel sounds present. No organomegaly or mass.  EXTREMITIES: No pedal edema, cyanosis, or clubbing.  NEUROLOGIC: Cranial nerves II through XII are intact. Muscle strength 5/5 in all extremities. Sensation intact. Gait not checked. Musculoskeletal: Right knee is in right knee immobilizer. PSYCHIATRIC: The patient is alert and oriented x 3.  Normal affect and good eye contact. SKIN: No obvious rash,  lesion, or ulcer.   LABORATORY PANEL:   CBC Recent Labs  Lab 12/28/23 1649  WBC 6.2  HGB 11.2*  HCT 32.5*  PLT 142*   ------------------------------------------------------------------------------------------------------------------  Chemistries  Recent Labs  Lab 12/28/23 1649  NA 142  K 3.8  CL 109  CO2 18*  GLUCOSE 91  BUN 17  CREATININE 1.05*  CALCIUM  8.8*   ------------------------------------------------------------------------------------------------------------------  Cardiac Enzymes No results for input(s): TROPONINI in the last 168 hours. ------------------------------------------------------------------------------------------------------------------  RADIOLOGY:  DG Knee Complete 4 Views Right Result Date: 12/28/2023 CLINICAL DATA:  RIGHT knee pain and deformity status post fall EXAM: RIGHT KNEE - COMPLETE 4+ VIEW COMPARISON:  None available FINDINGS: Diffuse osteopenia. Minimally displaced comminuted fracture of the proximal tibia extending to the tibial plateau. Moderate knee joint effusion. Atherosclerotic changes seen throughout visualized arterial segments. IMPRESSION: Minimally displaced comminuted fracture of the proximal tibia extending to the tibial plateau. Electronically Signed   By: Aliene Lloyd M.D.   On: 12/28/2023 16:30      IMPRESSION AND PLAN:  Assessment and Plan: * Closed right tibial fracture - The patient was admitted to a medical-surgical bed. - Pain management will be provided. - PT evaluation will be obtained. - Case management consult to be obtained to consider SNF placement for rehabilitation. - Orthopedic consult to be obtained. - Dr. Tobie was notified about the patient.  He does not believe the patient will need surgical intervention.  Dyslipidemia - Will continue Aricept .  Dementia without behavioral disturbance (HCC) - Will continue Aricept .  Depression - Will continue Prozac  and Depakote.    DVT prophylaxis:  Lovenox .  Advanced Care Planning:  Code Status: Patient is DNR and DNI.  This was discussed with her. Family Communication:  The plan of care was discussed in details with the patient (and family). I answered all questions. The patient agreed to proceed with the above mentioned plan. Further management will depend upon hospital course. Disposition Plan: Back to previous home environment Consults called: Orthopedic consult All the records are reviewed and case discussed with ED provider.  Status is: Inpatient   At the time of the admission, it appears that the appropriate admission status for  this patient is inpatient.  This is judged to be reasonable and necessary in order to provide the required intensity of service to ensure the patient's safety given the presenting symptoms, physical exam findings and initial radiographic and laboratory data in the context of comorbid conditions.  The patient requires inpatient status due to high intensity of service, high risk of further deterioration and high frequency of surveillance required.  I certify that at the time of admission, it is my clinical judgment that the patient will require inpatient hospital care extending more than 2 midnights.                            Dispo: The patient is from: Home              Anticipated d/c is to: Home              Patient currently is not medically stable to d/c.              Difficult to place patient: No  Madison DELENA Peaches M.D on 12/28/2023 at 9:10 PM  [facility]   From 7 PM-7 AM, contact night-coverage [email or web address]  CC: Primary care physician; Rilla Baller, MD

## 2023-12-28 NOTE — Assessment & Plan Note (Addendum)
 Continue Prozac  and Depakote.

## 2023-12-28 NOTE — ED Provider Notes (Signed)
 [facility] Provider Note    Event Date/Time   First MD Initiated Contact with Patient 12/28/23 1556     (approximate)   History   Chief Complaint Fall   HPI  Sierra Kaiser is a 89 y.o. female with past medical history of hypertension, atrial fibrillation on Xarelto , HFpEF, complete heart block status post pacemaker, CKD, and ulcerative colitis who presents to the ED complaining of fall.  Per EMS, patient had a fall off of a stepstool just prior to arrival, landing on her right knee.  Patient denies hitting her head or losing consciousness, complains of significant pain to her knee but denies any pain to her hip or ankle.  She also denies any injuries to her upper extremities.  She has been unable to bear weight on her right leg since the fall.     Physical Exam   Triage Vital Signs: ED Triage Vitals [12/28/23 1559]  Encounter Vitals Group     BP      Girls Systolic BP Percentile      Girls Diastolic BP Percentile      Boys Systolic BP Percentile      Boys Diastolic BP Percentile      Pulse      Resp      Temp      Temp src      SpO2      Weight      Height 5' 7 (1.702 m)     Head Circumference      Peak Flow      Pain Score 10     Pain Loc      Pain Education      Exclude from Growth Chart     Most recent vital signs: Vitals:   12/28/23 1730 12/28/23 1800  BP: (!) 159/69 (!) 132/59  Pulse: 69 70  Resp:    Temp:    SpO2: 100%     Constitutional: Alert and oriented. Eyes: Conjunctivae are normal. Head: Atraumatic. Nose: No congestion/rhinnorhea. Mouth/Throat: Mucous membranes are moist.  Cardiovascular: Normal rate, regular rhythm. Grossly normal heart sounds.  2+ radial and DP pulses bilaterally. Respiratory: Normal respiratory effort.  No retractions. Lungs CTAB.  No chest wall tenderness to palpation. Gastrointestinal: Soft and nontender. No distention. Musculoskeletal: Diffuse tenderness to palpation of right knee with no  obvious deformity, significant edema noted.  No tenderness to palpation at right hip, ankle, or foot.  No left lower extremity tenderness and no upper extremity tenderness. Neurologic:  Normal speech and language. No gross focal neurologic deficits are appreciated.    ED Results / Procedures / Treatments   Labs (all labs ordered are listed, but only abnormal results are displayed) Labs Reviewed  CBC WITH DIFFERENTIAL/PLATELET - Abnormal; Notable for the following components:      Result Value   RBC 3.43 (*)    Hemoglobin 11.2 (*)    HCT 32.5 (*)    Platelets 142 (*)    All other components within normal limits  BASIC METABOLIC PANEL WITH GFR - Abnormal; Notable for the following components:   CO2 18 (*)    Creatinine, Ser 1.05 (*)    Calcium  8.8 (*)    GFR, Estimated 51 (*)    All other components within normal limits     RADIOLOGY Right knee x-ray reviewed and interpreted by me with fracture of the tibial plateau.  PROCEDURES:  Critical Care performed: No  Procedures   MEDICATIONS ORDERED IN  ED: Medications  oxyCODONE (Oxy IR/ROXICODONE) immediate release tablet 5 mg (has no administration in time range)  morphine  (PF) 4 MG/ML injection 4 mg (4 mg Intravenous Given 12/28/23 1646)     IMPRESSION / MDM / ASSESSMENT AND PLAN / ED COURSE  I reviewed the triage vital signs and the nursing notes.                              89 y.o. female with past medical history of hypertension, HFpEF, atrial fibrillation on Xarelto , complete heart block status post pacemaker, CKD, and ulcerative colitis who presents to the ED complaining of right knee pain following fall.  Patient's presentation is most consistent with acute complicated illness / injury requiring diagnostic workup.  Differential diagnosis includes, but is not limited to, fracture, dislocation, contusion.  Patient uncomfortable but nontoxic-appearing and in no acute distress, vital signs are unremarkable.  She has  significant edema and tenderness to her right knee with no obvious deformity.  She is neurovascular intact distally with strong DP pulse.  No evidence of traumatic injury to her head, neck, trunk, or upper extremities.  We will give IV morphine  for pain, x-ray shows fracture of the proximal tibia extending into the tibial plateau, will discuss with orthopedics.  X-ray imaging reviewed with Dr. Tobie of orthopedics, who recommends nonoperative management with knee immobilizer.  Labs without significant anemia, leukocytosis, lecture abnormality, or AKI.  Patient continues to have significant pain despite multiple rounds of pain medication, will require admission for pain control and potential rehab placement.  Case discussed with hospitalist for admission.      FINAL CLINICAL IMPRESSION(S) / ED DIAGNOSES   Final diagnoses:  Closed fracture of right tibial plateau, initial encounter  Fall in home, initial encounter     Rx / DC Orders   ED Discharge Orders     None        Note:  This document was prepared using Dragon voice recognition software and may include unintentional dictation errors.   Willo Dunnings, MD 12/28/23 [phone]

## 2023-12-28 NOTE — ED Triage Notes (Addendum)
 BIB [facility] from for a fall. Pt complaint of right knee pain with deformity. Unwitnessed fall. 75mcg fentanyl   114/69 4mg  of zofran  IV LAC 20  Pt is caox4.

## 2023-12-28 NOTE — Progress Notes (Signed)
 Imaging reviewed. Patient has a minimally displaced R lateral tibial plateau fracture. Given level of displacement, this fracture can likely be treated nonoperatively. Recommend NWB on RLE in knee immobilizer.

## 2023-12-28 NOTE — Assessment & Plan Note (Addendum)
 Continue Aricept

## 2023-12-29 DIAGNOSIS — S82101S Unspecified fracture of upper end of right tibia, sequela: Secondary | ICD-10-CM | POA: Diagnosis not present

## 2023-12-29 DIAGNOSIS — N1831 Chronic kidney disease, stage 3a: Secondary | ICD-10-CM

## 2023-12-29 DIAGNOSIS — I9589 Other hypotension: Secondary | ICD-10-CM

## 2023-12-29 DIAGNOSIS — E785 Hyperlipidemia, unspecified: Secondary | ICD-10-CM | POA: Diagnosis not present

## 2023-12-29 DIAGNOSIS — F0390 Unspecified dementia without behavioral disturbance: Secondary | ICD-10-CM | POA: Diagnosis not present

## 2023-12-29 DIAGNOSIS — F3289 Other specified depressive episodes: Secondary | ICD-10-CM

## 2023-12-29 LAB — CBC
HCT: 29.8 % — ABNORMAL LOW (ref 36.0–46.0)
Hemoglobin: 10 g/dL — ABNORMAL LOW (ref 12.0–15.0)
MCH: 32.7 pg (ref 26.0–34.0)
MCHC: 33.6 g/dL (ref 30.0–36.0)
MCV: 97.4 fL (ref 80.0–100.0)
Platelets: 109 K/uL — ABNORMAL LOW (ref 150–400)
RBC: 3.06 MIL/uL — ABNORMAL LOW (ref 3.87–5.11)
RDW: 13.1 % (ref 11.5–15.5)
WBC: 5.1 K/uL (ref 4.0–10.5)
nRBC: 0 % (ref 0.0–0.2)

## 2023-12-29 LAB — BASIC METABOLIC PANEL WITH GFR
Anion gap: 11 (ref 5–15)
BUN: 18 mg/dL (ref 8–23)
CO2: 22 mmol/L (ref 22–32)
Calcium: 8.6 mg/dL — ABNORMAL LOW (ref 8.9–10.3)
Chloride: 107 mmol/L (ref 98–111)
Creatinine, Ser: 1.07 mg/dL — ABNORMAL HIGH (ref 0.44–1.00)
GFR, Estimated: 50 mL/min — ABNORMAL LOW (ref >60)
Glucose, Bld: 98 mg/dL (ref 70–99)
Potassium: 3.9 mmol/L (ref 3.5–5.1)
Sodium: 140 mmol/L (ref 135–145)

## 2023-12-29 MED ORDER — POLYETHYLENE GLYCOL 3350 17 G PO PACK
17.0000 g | PACK | Freq: Every day | ORAL | Status: DC
  Administered 2023-12-29 – 2024-01-02 (×5): 17 g via ORAL
  Filled 2023-12-29 (×5): qty 1

## 2023-12-29 MED ORDER — INFLUENZA VAC SPLIT HIGH-DOSE 0.5 ML IM SUSY
0.5000 mL | PREFILLED_SYRINGE | INTRAMUSCULAR | Status: AC
Start: 2023-12-30 — End: 2024-01-02
  Administered 2024-01-02: 0.5 mL via INTRAMUSCULAR
  Filled 2023-12-29 (×2): qty 0.5

## 2023-12-29 NOTE — Assessment & Plan Note (Signed)
On midodrine 

## 2023-12-29 NOTE — Hospital Course (Signed)
 89 y.o. female with medical history significant for hypertension, dyslipidemia, coronary artery disease, CHF, GERD, diverticulosis, PUD and Raynaud's disease, who presented to the emergency room with acute onset of accidental mechanical fall off of 3 steps of a ladder when she lost balance.  She denied any presyncope or syncope.  No head injuries.  No paresthesias or focal muscle weakness.  No tinnitus or vertigo she fell on her right knee.  No fever or chills.  No cough or wheezing or dyspnea.  No dysuria, oliguria or hematuria or flank pain.   ED Course: When she came to the ER, temperature was 96.6 and BP was 102/83 and later 148 /72.  Labs revealed mild anemia with hemoglobin 11.2 hematocrit 32.5 and no thrombocytopenia 142 with unremarkable BMP. EKG as reviewed by me : None. Imaging: 4 view right knee x-ray showed minimally displaced comminuted fracture of the proximal tibia extending to the tibial plateau.   The patient was given 4 mg of IV morphine  sulfate and 5 mg of p.o. oxycodone IR.  Dr. Tobie was notified about the patient and recommended right knee immobilizer.  She will be admitted to a medical-surgical bed for further evaluation and management.  10/25.  X-rays reviewed by orthopedic surgery and recommended nonoperative management, nonweightbearing right lower extremity and knee brace.

## 2023-12-29 NOTE — Progress Notes (Signed)
 Progress Note   Patient: Sierra Kaiser FMW:[id number] DOB: [birth year] DOA: 12/28/2023     1 DOS: the patient was seen and examined on 12/29/2023   Brief hospital course: 89 y.o. female with medical history significant for hypertension, dyslipidemia, coronary artery disease, CHF, GERD, diverticulosis, PUD and Raynaud's disease, who presented to the emergency room with acute onset of accidental mechanical fall off of 3 steps of a ladder when she lost balance.  She denied any presyncope or syncope.  No head injuries.  No paresthesias or focal muscle weakness.  No tinnitus or vertigo she fell on her right knee.  No fever or chills.  No cough or wheezing or dyspnea.  No dysuria, oliguria or hematuria or flank pain.   ED Course: When she came to the ER, temperature was 96.6 and BP was 102/83 and later 148 /72.  Labs revealed mild anemia with hemoglobin 11.2 hematocrit 32.5 and no thrombocytopenia 142 with unremarkable BMP. EKG as reviewed by me : None. Imaging: 4 view right knee x-ray showed minimally displaced comminuted fracture of the proximal tibia extending to the tibial plateau.   The patient was given 4 mg of IV morphine  sulfate and 5 mg of p.o. oxycodone IR.  Dr. Tobie was notified about the patient and recommended right knee immobilizer.  She will be admitted to a medical-surgical bed for further evaluation and management.  10/25.  X-rays reviewed by orthopedic surgery and recommended nonoperative management, nonweightbearing right lower extremity and knee brace.  Assessment and Plan: * Closed right tibial fracture Orthopedic surgery reviewed the films and recommended nonoperative management knee brace and nonweightbearing right lower extremity.  PT and OT consultations.  PT recommending rehab.  TOC to look into bed options.  Dyslipidemia On Zetia  and Crestor   Dementia without behavioral disturbance (HCC) Continue Aricept .  Depression Continue Prozac  and Depakote.  Hypotension On  midodrine  CKD stage 3a, GFR 45-59 ml/min (HCC) Creatinine 1.07 with a GFR 50        Subjective: Patient states that she was lifting a heavy plant while on a stepladder and had a fall.  She did not lose consciousness or hit her head.  Found to have a minimally displaced commuted fracture of the proximal tibia extending into the tibial plateau  Physical Exam: Vitals:   12/29/23 0300 12/29/23 0357 12/29/23 0631 12/29/23 0800  BP: (!) 154/72  134/61 132/68  Pulse: 72  73 71  Resp: 16  17 17   Temp:  97.8 F (36.6 C)  (!) 97.4 F (36.3 C)  TempSrc:  Oral    SpO2: 96%  100% 98%  Weight:      Height:       Physical Exam HENT:     Head: Normocephalic.     Mouth/Throat:     Pharynx: No oropharyngeal exudate.  Eyes:     General: Lids are normal.     Conjunctiva/sclera: Conjunctivae normal.  Cardiovascular:     Rate and Rhythm: Normal rate and regular rhythm.     Heart sounds: Normal heart sounds, S1 normal and S2 normal.  Pulmonary:     Breath sounds: No decreased breath sounds, wheezing, rhonchi or rales.  Abdominal:     Palpations: Abdomen is soft.     Tenderness: There is no abdominal tenderness.  Skin:    General: Skin is warm.     Findings: No rash.  Neurological:     Mental Status: She is alert.     Comments: Able to wiggle toes  and flex extend at the ankle     Data Reviewed: Minimally displaced commuted fracture of the proximal tibia extending into the tibial plateau Creatinine 1.07 with a GFR of 50  Family Communication: Spoke with sister at the bedside  Disposition: Status is: Inpatient Remains inpatient appropriate because: Physical therapy recommending rehab.  Pain control.  Planned Discharge Destination: Skilled nursing facility    Time spent: 29 minutes  Author: Charlie Patterson, MD 12/29/2023 1:30 PM  For on call review [email or web address].

## 2023-12-29 NOTE — TOC Initial Note (Addendum)
 Transition of Care [facility]) - Initial/Assessment Note    Patient Details  Name: Sierra Kaiser MRN: [id number] Date of Birth: Nov 30, 1933  Transition of Care [facility]) CM/SW Contact:    Wren Pryce L Josephyne Tarter, LCSW Phone Number: 12/29/2023, 5:21 PM  Clinical Narrative:                  Brief assessment completed with patient. Patient declined SNF placement. Patient advised that she lives at home with her spouse. DME discussed. Patient is accepting the [facility] and wheelchair with cushion.   Attending notified. Attending advised that patient has a POA. CSW reviewed chart. There is paperwork from 2003 however, it does not list the patients' sister as POA. CSW received a phone call from the patients sister. CSW advised that POA paperwork is needed. She agreed to bring a copy to the facility. She advised that her sister gets confused. She stated that it would not be appropriate for her to make decisions on her own.   Patient has not been deemed incompetent or lacking capacity in anyway by the court of law. CSW will review POA paperwork and seek guidance from leadership.   SNF bed search on hold until CSW can review HCPOA. HCPOA only allows the holder to make decisions when the patient is unable to. It is not meant to control the patient and to dismiss their wishes.        Patient Goals and CMS Choice            Expected Discharge Plan and Services                                              Prior Living Arrangements/Services                       Activities of Daily Living   ADL Screening (condition at time of admission) Independently performs ADLs?: Yes (appropriate for developmental age) Is the patient deaf or have difficulty hearing?: Yes Does the patient have difficulty seeing, even when wearing glasses/contacts?: No Does the patient have difficulty concentrating, remembering, or making decisions?: Yes  Permission Sought/Granted                  Emotional  Assessment              Admission diagnosis:  Closed right tibial fracture [S82.201A] Fall in home, initial encounter [W19.XXXA, Y92.009] Closed fracture of right tibial plateau, initial encounter [S82.141A] Patient Active Problem List   Diagnosis Date Noted   Closed right tibial fracture 12/28/2023   Dyslipidemia 12/28/2023   Depression 12/28/2023   Dementia without behavioral disturbance (HCC) 12/28/2023   Scratch of lower leg 11/30/2023   Pedal edema 11/30/2023   Hypotension 10/30/2023   Unintentional weight loss 10/30/2023   Acute right-sided thoracic back pain 07/28/2023   Stress due to family tension 05/23/2023   Carotid stenosis 05/23/2023   Microalbuminuria 05/08/2023   Positive occult stool blood test 08/15/2022   Peeling skin 08/14/2022   Hearing loss of right ear due to cerumen impaction 04/11/2022   Urge incontinence 02/03/2022   Lipoma 11/04/2020   Acquired hypercoagulable state 11/04/2020   Mild cognitive impairment with memory loss 07/23/2020   Injury of left rotator cuff 05/03/2020   S/P placement of cardiac pacemaker 12/04/2019   Raynaud's disease 12/02/2019   IgG lambda monoclonal  gammopathy 11/08/2019   Positive ANA (antinuclear antibody) 11/08/2019   Subclinical hypothyroidism 10/30/2019   Vitamin D  deficiency 10/30/2019   Dizziness 09/24/2019   Pruritus 10/28/2018   Medicare annual wellness visit, subsequent 10/18/2018   Advanced care planning/counseling discussion 10/18/2018   Anxiety with agitation 01/28/2018   Low serum vitamin B12 11/27/2017   Right knee pain 07/17/2017   Chronic fatigue 06/13/2017   Encounter for general adult medical examination with abnormal findings 01/30/2017   Chronic constipation 01/30/2017   Osteoporosis 12/11/2015   Iron  deficiency 05/26/2015   Meniere's disease    Hyperlipidemia    CKD stage 3a, GFR 45-59 ml/min (HCC)    Mitral regurgitation    Complete heart block (HCC) 05/06/2014   Atrial fibrillation,  permanent (HCC) 04/22/2014   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 [place] [address] Phone: [phone] Fax: [phone]     Social Drivers of Health (SDOH) Social History: SDOH Screenings   Food Insecurity: No Food Insecurity (12/29/2023)  Housing: Low Risk  (12/29/2023)  Transportation Needs: No Transportation Needs (12/29/2023)  Utilities: Not At Risk (12/29/2023)  Alcohol Screen: Low Risk  (03/19/2023)  Depression (PHQ2-9): Medium Risk (10/29/2023)  Financial Resource Strain: Low Risk  (03/19/2023)  Physical Activity: Inactive (03/19/2023)  Social Connections: Moderately Integrated (12/29/2023)  Stress: No Stress Concern Present (03/19/2023)  Tobacco Use: Low Risk  (12/28/2023)   SDOH Interventions:     Readmission Risk Interventions     No data to display

## 2023-12-29 NOTE — Assessment & Plan Note (Signed)
 Today's creatinine 0.88 with a GFR greater than 60

## 2023-12-29 NOTE — Evaluation (Signed)
 Occupational Therapy Evaluation Patient Details Name: Sierra Kaiser MRN: [id number] DOB: Jul 12, 1933 Today's Date: 12/29/2023   History of Present Illness   89 y/o female presented to ED on 12/28/23 after unwitnessed fall with R knee pain with deformity. Sustained minimally displaced comminuted fracture of proximal tibia on R. Treating non-operatively per orthopedics. PMH: HTN, CAD, CHF, Raynaud's disease, diverticulosis.     Clinical Impressions Sierra Kaiser was seen for OT evaluation this date. Prior to hospital admission, pt was generally independent with ADL/IADL management. Pt lives with her spouse in a 1 level home with .~2 STE. Pt presents with deficits in balance, LB access, safety awareness, and ROM with orders for her RLE to remain immobilized and NWB, affecting safe and optimal ADL completion. Pt currently requires MIN A for STS t/fs, and close CGA for safety to pivot to a recliner/seated surface. She requires MOD-MAX A for LB ADL management from STS.  Pt would benefit from skilled OT services to address noted impairments and functional limitations (see below for any additional details) in order to maximize safety and independence while minimizing future risk of falls, injury, and readmission. Anticipate the need for follow up OT services upon acute hospital DC.      If plan is discharge home, recommend the following:   A little help with walking and/or transfers;A lot of help with bathing/dressing/bathroom;Assistance with cooking/housework;Help with stairs or ramp for entrance;Assist for transportation     Functional Status Assessment   Patient has had a recent decline in their functional status and demonstrates the ability to make significant improvements in function in a reasonable and predictable amount of time.     Equipment Recommendations   BSC/3in1;Wheelchair (measurements OT);Wheelchair cushion (measurements OT)     Recommendations for Other Services          Precautions/Restrictions   Precautions Precautions: Fall Recall of Precautions/Restrictions: Intact Required Braces or Orthoses: Knee Immobilizer - Right Knee Immobilizer - Right: On at all times Restrictions Weight Bearing Restrictions Per Provider Order: Yes RLE Weight Bearing Per Provider Order: Non weight bearing     Mobility Bed Mobility Overal bed mobility: Needs Assistance Bed Mobility: Supine to Sit     Supine to sit: Contact guard     General bed mobility comments: able to utilize LLE to assist R LE off bed    Transfers Overall transfer level: Needs assistance Equipment used: Rolling walker (2 wheels) Transfers: Sit to/from Stand, Bed to chair/wheelchair/BSC Sit to Stand: Min assist, +2 safety/equipment Stand pivot transfers: Min assist, +2 physical assistance         General transfer comment: MinA to stand from low bed surface with cues for hand placement. Able to pivot on LLE to transfer to recliner with RW      Balance Overall balance assessment: Needs assistance Sitting-balance support: No upper extremity supported, Feet supported Sitting balance-Leahy Scale: Normal Sitting balance - Comments: steady with dynamic sitting balance/reaching outside BOS   Standing balance support: Bilateral upper extremity supported, Reliant on assistive device for balance, During functional activity Standing balance-Leahy Scale: Poor Standing balance comment: Heavy UE reliance on RW, Initial MIN A to maintain static standing balance on LLE.                           ADL either performed or assessed with clinical judgement   ADL Overall ADL's : Needs assistance/impaired  General ADL Comments: Functionally limited by NWB status through RLE, MAX A to don LB clothing over RLE 2/2 KI, MIN A to STS from bed in lowest position, CGA to pivot to recliner. Anticipate MIN A for toileting/toilet transfer. SET  UP/SUP for seated UB ADL management. Cueing for adherence to NWB precaution t/o functional activity.     Vision Patient Visual Report: No change from baseline       Perception         Praxis         Pertinent Vitals/Pain Pain Assessment Pain Assessment: No/denies pain Faces Pain Scale: Hurts even more Pain Location: R LE Pain Descriptors / Indicators: Discomfort, Grimacing Pain Intervention(s): Limited activity within patient's tolerance, Monitored during session, Patient requesting pain meds-RN notified, Repositioned, RN gave pain meds during session     Extremity/Trunk Assessment Upper Extremity Assessment Upper Extremity Assessment: Overall WFL for tasks assessed   Lower Extremity Assessment Lower Extremity Assessment: Generalized weakness;RLE deficits/detail RLE Deficits / Details: RLE NWB in KI   Cervical / Trunk Assessment Cervical / Trunk Assessment: Kyphotic   Communication Communication Communication: Impaired Factors Affecting Communication: Hearing impaired   Cognition Arousal: Alert Behavior During Therapy: WFL for tasks assessed/performed                                 Following commands: Intact       Cueing  General Comments   Cueing Techniques: Verbal cues      Exercises Other Exercises Other Exercises: PT/caregiver educated on role of OT in acute setting, safety, falls prevention strategies, DC recs and safe use of AE/DME for ADL management. Would benefit from further reinforcement.   Shoulder Instructions      Home Living Family/patient expects to be discharged to:: Private residence Living Arrangements: Spouse/significant other Available Help at Discharge: Family;Available PRN/intermittently Type of Home: House Home Access: Stairs to enter [employer] of Steps: 2 Entrance Stairs-Rails: None Home Layout: One level     Bathroom Shower/Tub: Walk-in shower         Home Equipment: [place] - single  point;Shower seat - built [employer] (2 wheels);Rollator (4 wheels)          Prior Functioning/Environment Prior Level of Function : Independent/Modified Independent             Mobility Comments: ambulatory without AD, 1 additional fall in the last 6 months while she was walking in the woods. ADLs Comments: bathes & dresses without assistance, husband does cooking & cleaning    OT Problem List: Decreased strength;Decreased coordination;Pain;Decreased range of motion;Decreased safety awareness;Impaired balance (sitting and/or standing);Decreased knowledge of use of DME or AE;Decreased knowledge of precautions   OT Treatment/Interventions: Self-care/ADL training;Therapeutic exercise;Therapeutic activities;Patient/family education;DME and/or AE instruction;Balance training;Energy conservation      OT Goals(Current goals can be found in the care plan section)   Acute Rehab OT Goals Patient Stated Goal: To go home OT Goal Formulation: With patient Time For Goal Achievement: 01/12/24 Potential to Achieve Goals: Good ADL Goals Pt Will Perform Grooming: sitting;with supervision;with set-up Pt Will Perform Lower Body Dressing: sitting/lateral leans;with adaptive equipment;with min assist Pt Will Transfer to Toilet: stand pivot transfer;with supervision;with set-up;bedside commode   OT Frequency:       Co-evaluation              AM-PAC OT 6 Clicks Daily Activity     Outcome Measure Help from another person eating  meals?: A Little Help from another person taking care of personal grooming?: A Little Help from another person toileting, which includes using toliet, bedpan, or urinal?: A Little Help from another person bathing (including washing, rinsing, drying)?: A Lot Help from another person to put on and taking off regular upper body clothing?: A Little Help from another person to put on and taking off regular lower body clothing?: A Lot 6 Click Score: 16   End  of Session Equipment Utilized During Treatment: Gait belt;Rolling walker (2 wheels) Nurse Communication: Mobility status  Activity Tolerance: Patient tolerated treatment well Patient left: in chair;with call bell/phone within reach;with chair alarm set  OT Visit Diagnosis: Other abnormalities of gait and mobility (R26.89);Pain Pain - Right/Left: Right Pain - part of body: Knee;Leg                Time: 9166-9095 OT Time Calculation (min): 31 min Charges:  OT General Charges $OT Visit: 1 Visit OT Evaluation $OT Eval Moderate Complexity: 1 Mod OT Treatments $Self Care/Home Management : 8-22 mins  Jhonny Pelton, M.S., OTR/L 12/29/23, 11:40 AM

## 2023-12-29 NOTE — Evaluation (Signed)
 Physical Therapy Evaluation Patient Details Name: Sierra Kaiser MRN: [id number] DOB: 05-11-1933 Today's Date: 12/29/2023  History of Present Illness  89 y/o female presented to ED on 12/28/23 after unwitnessed fall with R knee pain with deformity. Sustained minimally displaced comminuted fracture of proximal tibia on R. Treating non-operatively per orthopedics. PMH: HTN, CAD, CHF, Raynaud's disease, diverticulosis.  Clinical Impression  Patient admitted with the above. PTA, patient lives with husband and was independent with mobility with no AD. Patient does not drive and husband is supposed to not drive due to vision loss. Patient presents with weakness, impaired balance, decreased activity tolerance, and pain. Educated patient on WB restrictions and impact on mobility, patient verbalized understanding but required further education on time frame of restrictions. Required CGA for bed mobility and minA to stand from EOB with cues for hand placement on RW. Able to pivot on LLE over to recliner with minA+2 and RW. Sister present towards end of session and provided with education. Patient will benefit from skilled PT services during acute stay to address listed deficits. Patient will benefit from ongoing therapy at discharge to maximize functional independence and safety.         If plan is discharge home, recommend the following: A lot of help with walking and/or transfers;A lot of help with bathing/dressing/bathroom;Assistance with cooking/housework;Assist for transportation;Help with stairs or ramp for entrance   Can travel by private vehicle   No    Equipment Recommendations Other (comment) (TBD at next venue of care)  Recommendations for Other Services       Functional Status Assessment Patient has had a recent decline in their functional status and demonstrates the ability to make significant improvements in function in a reasonable and predictable amount of time.     Precautions /  Restrictions Precautions Precautions: Fall Recall of Precautions/Restrictions: Intact Required Braces or Orthoses: Knee Immobilizer - Right Knee Immobilizer - Right: On at all times (except with PT for ROM exercises) Restrictions Weight Bearing Restrictions Per Provider Order: Yes RLE Weight Bearing Per Provider Order: Non weight bearing      Mobility  Bed Mobility Overal bed mobility: Needs Assistance Bed Mobility: Supine to Sit     Supine to sit: Contact guard     General bed mobility comments: able to utilize LLE to assist R LE off bed    Transfers Overall transfer level: Needs assistance Equipment used: Rolling Amylynn Fano (2 wheels) Transfers: Sit to/from Stand, Bed to chair/wheelchair/BSC Sit to Stand: Min assist, +2 safety/equipment Stand pivot transfers: Min assist, +2 physical assistance         General transfer comment: MinA to stand from low bed surface with cues for hand placement. Able to pivot on LLE to transfer to recliner with RW    Ambulation/Gait                  Stairs            Wheelchair Mobility     Tilt Bed    Modified Rankin (Stroke Patients Only)       Balance Overall balance assessment: Needs assistance Sitting-balance support: No upper extremity supported, Feet supported Sitting balance-Leahy Scale: Normal     Standing balance support: Bilateral upper extremity supported, Reliant on assistive device for balance Standing balance-Leahy Scale: Poor                               Pertinent Vitals/Pain Pain Assessment  Pain Assessment: Faces Faces Pain Scale: Hurts even more Pain Location: R LE Pain Descriptors / Indicators: Discomfort, Grimacing Pain Intervention(s): Limited activity within patient's tolerance, Monitored during session, Repositioned, RN gave pain meds during session    Home Living Family/patient expects to be discharged to:: Private residence Living Arrangements: Spouse/significant  other Available Help at Discharge: Family;Available PRN/intermittently Type of Home: House Home Access: Stairs to enter Entrance Stairs-Rails: None Entrance Stairs-Number of Steps: 2   Home Layout: One level Home Equipment: Cane - single point;Shower seat - built in      Prior Function Prior Level of Function : Independent/Modified Independent             Mobility Comments: ambulatory without AD, walks in the woods & picks up limbs, notes 2 falls in the past 6 months, no longer drives ADLs Comments: bathes & dresses without assistance, husband does cooking & cleaning     Extremity/Trunk Assessment   Upper Extremity Assessment Upper Extremity Assessment: Defer to OT evaluation    Lower Extremity Assessment Lower Extremity Assessment: Generalized weakness (RLE immobilized in KI)    Cervical / Trunk Assessment Cervical / Trunk Assessment: Kyphotic  Communication   Communication Communication: Impaired Factors Affecting Communication: Hearing impaired    Cognition Arousal: Alert Behavior During Therapy: WFL for tasks assessed/performed   PT - Cognitive impairments: No apparent impairments                       PT - Cognition Comments: decreased understanding of current situation and weightbearing restrictions Following commands: Intact       Cueing Cueing Techniques: Verbal cues     General Comments      Exercises     Assessment/Plan    PT Assessment Patient needs continued PT services  PT Problem List Decreased strength;Decreased activity tolerance;Decreased balance;Decreased mobility;Decreased range of motion;Decreased safety awareness;Decreased knowledge of use of DME;Decreased knowledge of precautions;Pain       PT Treatment Interventions DME instruction;Gait training;Functional mobility training;Therapeutic activities;Balance training;Neuromuscular re-education;Therapeutic exercise;Patient/family education    PT Goals (Current goals can be  found in the Care Plan section)  Acute Rehab PT Goals Patient Stated Goal: to get better PT Goal Formulation: With patient/family Time For Goal Achievement: 01/12/24 Potential to Achieve Goals: Fair    Frequency Min 2X/week     Co-evaluation               AM-PAC PT 6 Clicks Mobility  Outcome Measure Help needed turning from your back to your side while in a flat bed without using bedrails?: A Little Help needed moving from lying on your back to sitting on the side of a flat bed without using bedrails?: A Little Help needed moving to and from a bed to a chair (including a wheelchair)?: A Little Help needed standing up from a chair using your arms (e.g., wheelchair or bedside chair)?: A Little Help needed to walk in hospital room?: Total Help needed climbing 3-5 steps with a railing? : Total 6 Click Score: 14    End of Session   Activity Tolerance: Patient tolerated treatment well Patient left: in chair;with call bell/phone within reach;with chair alarm set;with family/visitor present Nurse Communication: Mobility status PT Visit Diagnosis: Unsteadiness on feet (R26.81);Muscle weakness (generalized) (M62.81);Other abnormalities of gait and mobility (R26.89)    Time: 9166-9095 PT Time Calculation (min) (ACUTE ONLY): 31 min   Charges:   PT Evaluation $PT Eval Moderate Complexity: 1 Mod PT Treatments $Therapeutic Activity: 8-22  mins PT General Charges $$ ACUTE PT VISIT: 1 Visit         Maryanne Finder, PT, DPT Physical Therapist - [facility]  [facility]   Shenise Wolgamott A Mckaylah Bettendorf 12/29/2023, 10:45 AM

## 2023-12-30 DIAGNOSIS — S82101D Unspecified fracture of upper end of right tibia, subsequent encounter for closed fracture with routine healing: Secondary | ICD-10-CM

## 2023-12-30 DIAGNOSIS — M25512 Pain in left shoulder: Secondary | ICD-10-CM | POA: Insufficient documentation

## 2023-12-30 DIAGNOSIS — D696 Thrombocytopenia, unspecified: Secondary | ICD-10-CM | POA: Insufficient documentation

## 2023-12-30 DIAGNOSIS — N1831 Chronic kidney disease, stage 3a: Secondary | ICD-10-CM | POA: Diagnosis not present

## 2023-12-30 DIAGNOSIS — E785 Hyperlipidemia, unspecified: Secondary | ICD-10-CM | POA: Diagnosis not present

## 2023-12-30 DIAGNOSIS — F0390 Unspecified dementia without behavioral disturbance: Secondary | ICD-10-CM | POA: Diagnosis not present

## 2023-12-30 LAB — URINALYSIS, W/ REFLEX TO CULTURE (INFECTION SUSPECTED)
Bilirubin Urine: NEGATIVE
Glucose, UA: NEGATIVE mg/dL
Hgb urine dipstick: NEGATIVE
Ketones, ur: NEGATIVE mg/dL
Nitrite: POSITIVE — AB
Protein, ur: NEGATIVE mg/dL
Specific Gravity, Urine: 1.013 (ref 1.005–1.030)
pH: 5 (ref 5.0–8.0)

## 2023-12-30 MED ORDER — OXYCODONE HCL 5 MG PO TABS
5.0000 mg | ORAL_TABLET | Freq: Four times a day (QID) | ORAL | Status: DC | PRN
  Administered 2023-12-30 (×2): 5 mg via ORAL
  Filled 2023-12-30 (×2): qty 1

## 2023-12-30 MED ORDER — MORPHINE SULFATE (PF) 2 MG/ML IV SOLN
2.0000 mg | INTRAVENOUS | Status: DC | PRN
  Administered 2024-01-02: 2 mg via INTRAVENOUS
  Filled 2023-12-30: qty 1

## 2023-12-30 MED ORDER — OXYCODONE HCL 5 MG PO TABS: 2.5000 mg | ORAL_TABLET | Freq: Four times a day (QID) | ORAL | Status: DC | PRN

## 2023-12-30 NOTE — Assessment & Plan Note (Signed)
 Continue to monitor.  Hepatitis C negative.

## 2023-12-30 NOTE — Plan of Care (Signed)

## 2023-12-30 NOTE — Progress Notes (Addendum)
 Progress Note   Patient: Sierra Kaiser FMW:[id number] DOB: 25-Sep-1933 DOA: 12/28/2023     2 DOS: the patient was seen and examined on 12/30/2023   Brief hospital course: 89 y.o. female with medical history significant for hypertension, dyslipidemia, coronary artery disease, CHF, GERD, diverticulosis, PUD and Raynaud's disease, who presented to the emergency room with acute onset of accidental mechanical fall off of 3 steps of a ladder when she lost balance.  She denied any presyncope or syncope.  No head injuries.  No paresthesias or focal muscle weakness.  No tinnitus or vertigo she fell on her right knee.  No fever or chills.  No cough or wheezing or dyspnea.  No dysuria, oliguria or hematuria or flank pain.   ED Course: When she came to the ER, temperature was 96.6 and BP was 102/83 and later 148 /72.  Labs revealed mild anemia with hemoglobin 11.2 hematocrit 32.5 and no thrombocytopenia 142 with unremarkable BMP. EKG as reviewed by me : None. Imaging: 4 view right knee x-ray showed minimally displaced comminuted fracture of the proximal tibia extending to the tibial plateau.   The patient was given 4 mg of IV morphine  sulfate and 5 mg of p.o. oxycodone IR.  Dr. Tobie was notified about the patient and recommended right knee immobilizer.  She will be admitted to a medical-surgical bed for further evaluation and management.  10/25.  X-rays reviewed by orthopedic surgery and recommended nonoperative management, nonweightbearing right lower extremity and knee brace 10/26.  Patient complaining of some left shoulder pain but able to move her left arm pretty good.  Assessment and Plan: * Closed right tibial fracture Orthopedic surgery reviewed the films and recommended nonoperative management knee brace and nonweightbearing right lower extremity.  PT and OT consultations.  PT recommending rehab.  TOC will have to speak with patient again about rehab.  Dyslipidemia On Zetia  and  Crestor   Thrombocytopenia Continue to monitor.  Check hepatitis C.  Left shoulder pain Patient moving her left shoulder very well today.  Dementia without behavioral disturbance (HCC) Continue Aricept .  Depression Continue Prozac  and Depakote.  Hypotension On midodrine  CKD stage 3a, GFR 45-59 ml/min (HCC) Creatinine 1.07 with a GFR 50        Subjective: Patient complains of some left shoulder pain.  Also has some right knee pain.  Admitted after a fall and found to have a tibial fracture.  Physical Exam: Vitals:   12/29/23 1544 12/29/23 2030 12/30/23 0551 12/30/23 0747  BP: (!) 126/53 (!) 127/50 (!) 149/62 (!) 146/78  Pulse: 71 72 70 70  Resp: 17 18 18 16   Temp: (!) 97.5 F (36.4 C) 97.7 F (36.5 C) 97.6 F (36.4 C) (!) 97.4 F (36.3 C)  TempSrc:  Oral Oral Oral  SpO2: 99% 100% 98% 99%  Weight:      Height:       Physical Exam HENT:     Head: Normocephalic.     Mouth/Throat:     Pharynx: No oropharyngeal exudate.  Eyes:     General: Lids are normal.     Conjunctiva/sclera: Conjunctivae normal.  Cardiovascular:     Rate and Rhythm: Normal rate and regular rhythm.     Heart sounds: Normal heart sounds, S1 normal and S2 normal.  Pulmonary:     Breath sounds: No decreased breath sounds, wheezing, rhonchi or rales.  Abdominal:     Palpations: Abdomen is soft.     Tenderness: There is no abdominal tenderness.  Musculoskeletal:  Comments: Able to move left shoulder pretty good.  Skin:    General: Skin is warm.     Findings: No rash.  Neurological:     Mental Status: She is alert.     Comments: Able to wiggle toes and flex extend at the ankle     Data Reviewed: Creatinine 1.07, GFR 50, white blood cell count 5.1, hemoglobin 10.0, platelet count 109  Family Communication: Spoke with sister on the phone  Disposition: Status is: Inpatient Remains inpatient appropriate because: Will need a rehab bed and insurance company authorization.  Planned  Discharge Destination: Physical therapy recommending rehab    Time spent: 28 minutes  Author: Charlie Patterson, MD 12/30/2023 10:45 AM  For on call review [email or web address].

## 2023-12-30 NOTE — Consult Note (Signed)
 ORTHOPAEDIC CONSULTATION  REQUESTING PHYSICIAN: Josette Ade, MD  Chief Complaint:   R knee pain  History of Present Illness: Sierra Kaiser is a 89 y.o. femalewho had a fall at home 2 days ago when she fell off of a ladder after losing her balance.  The patient noted immediate right knee pain and inability to ambulate.  The patient ambulates with a unassisted at baseline.  X-rays in the emergency department show a right minimally displaced lateral tibial plateau fracture.  She has a medical history significant for hypertension, dyslipidemia, coronary artery disease, CHF, GERD, diverticulosis, PUD and Raynaud's disease  Past Medical History:  Diagnosis Date   Anxiety    CAD (coronary artery disease)    CHF (congestive heart failure) (HCC)    Esophageal reflux    GI bleeding 2012   during EGD necessitating open surgery   Hiatal hernia    History of blood transfusion    History of diverticulitis    History of UTI    HTN (hypertension)    Hyperlipidemia    Meniere's disease 1970s   Mitral regurgitation    Osteopenia 12/11/2015   T score -1.2 femur, -2.0 spine (12/2015)   Permanent atrial fibrillation (HCC)    a. permanent b. s/p PVI RFA at [facility] 10/14 c. failed amio (neuro toxicity) and Tikosyn d. 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    PUD (peptic ulcer disease)    Raynaud's disease    Scalp laceration, subsequent encounter 07/10/2022   SNHL (sensorineural hearing loss)    right ear   Syncopal episodes    Tinnitus    Tinnitus    Ulcerative colitis (HCC)    per prior records   Urinary incontinence, urge    Past Surgical History:  Procedure Laterality Date   APPENDECTOMY  1973   AV NODE ABLATION N/A 05/06/2014   Procedure: AV NODE ABLATION;  Surgeon: Elspeth JAYSON Sage, MD;  Location: [facility] CATH LAB;  Service: Cardiovascular;  Laterality: N/A;   CARDIAC CATHETERIZATION  2014    [facility]   CARDIAC ELECTROPHYSIOLOGY STUDY AND ABLATION     CHOLECYSTECTOMY  2010   COLONOSCOPY  2010   polyps   COLONOSCOPY WITH PROPOFOL  N/A 08/29/2017   TAs, no f/u recommended Emil, Ladell POUR, MD)   ESOPHAGOGASTRODUODENOSCOPY (EGD) WITH PROPOFOL  N/A 08/29/2017   benign biopsies Emil, Ladell POUR, MD)   HEMORRHOID SURGERY     LAPAROTOMY  2012   EGD biopsy led to bleeding - needed laparotomy to stop bleed (Dr Trudy at [facility])   MOHS SURGERY     back   PERMANENT PACEMAKER INSERTION N/A 04/22/2014   STJ single chamber pacemaker implanted by Dr Sage   TRANSESOPHAGEAL ECHOCARDIOGRAM WITH CARDIOVERSION  2014   [facility]   UPPER GI ENDOSCOPY  2012   with polypectomy   VAGINAL HYSTERECTOMY  1973   elective; ovaries remained   Social History   Socioeconomic History   Marital status: Married    Spouse name: Not on file   Number of children: Not on file   Years of education: Not on file   Highest education level: Not on file  Occupational History   Not on file  Tobacco Use   Smoking status: Never   Smokeless tobacco: Never  Vaping Use   Vaping status: Never Used  Substance and Sexual Activity   Alcohol use: Yes    Alcohol/week: 1.0 standard drink of alcohol    Types: 1 Glasses of  wine per week    Comment: 1-2 glasses wine a month   Drug use: Never   Sexual activity: Never  Other Topics Concern   Not on file  Social History Narrative   Lives with husband.    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)   Edu: [employer]   Social Drivers of Corporate Investment Banker Strain: Low Risk  (03/19/2023)   Overall Financial Resource Strain (CARDIA)    Difficulty of Paying Living Expenses: Not hard at all  Food Insecurity: No Food Insecurity (12/29/2023)   Hunger Vital Sign    Worried About Running Out of Food in the Last Year: Never true    Ran Out of Food in the Last Year: Never true  Transportation Needs: No  Transportation Needs (12/29/2023)   PRAPARE - Administrator, Civil Service (Medical): No    Lack of Transportation (Non-Medical): No  Physical Activity: Inactive (03/19/2023)   Exercise Vital Sign    Days of Exercise per Week: 0 days    Minutes of Exercise per Session: 0 min  Stress: No Stress Concern Present (03/19/2023)   [employer] of Occupational Health - Occupational Stress Questionnaire    Feeling of Stress : Not at all  Social Connections: Moderately Integrated (12/29/2023)   Social Connection and Isolation Panel    Frequency of Communication with Friends and Family: More than three times a week    Frequency of Social Gatherings with Friends and Family: More than three times a week    Attends Religious Services: More than 4 times per year    Active Member of [employer] or Organizations: No    Attends Engineer, Structural: Never    Marital Status: Married   Family History  Problem Relation Age of Onset   Arrhythmia Mother    Hypertension Mother    Diabetes Mother    Heart failure Brother    Heart failure Brother    Cancer Brother        colon (with colostomy)   Breast cancer Sister 22   Stroke Sister    Alcohol abuse Brother    Stroke Brother    Diabetes Son    Allergies  Allergen Reactions   Gadolinium Derivatives Itching and Rash    Persisted for months   Contrast Media [Iodinated Contrast Media] Hives        Gabapentin Other (See Comments)    Affected mood bad   Metronidazole Itching and Other (See Comments)    Hands really red   Other    Sertraline  Other (See Comments)    tremors   Diltiazem  Hcl Itching and Rash   Prior to Admission medications   Medication Sig Start Date End Date Taking? Authorizing Provider  Cetirizine HCl (ZYRTEC PO) Take by mouth.   Yes [provider]  Cholecalciferol (VITAMIN D3) 25 MCG (1000 UT) CAPS Take 1 capsule (1,000 Units total) by mouth daily. 10/29/23  Yes Rilla Baller, MD  divalproex  (DEPAKOTE) 125 MG DR tablet Take 1 tablet (125 mg total) by mouth at bedtime. Through neurology Dr Lane 10/29/23  Yes Rilla Baller, MD  donepezil  (ARICEPT ) 10 MG tablet Take 1 tablet (10 mg total) by mouth at bedtime. 07/27/23  Yes Rilla Baller, MD  FLUoxetine  (PROZAC ) 10 MG capsule Take 1 capsule (10 mg total) by mouth daily. 11/28/23  Yes Rilla Baller, MD  fluticasone  (FLONASE ) 50 MCG/ACT nasal spray Place 2 sprays into both  nostrils daily. 04/17/23  Yes Rilla Baller, MD  meclizine  (ANTIVERT ) 25 MG tablet Take 0.5-1 tablets (12.5-25 mg total) by mouth 2 (two) times daily as needed for dizziness. 05/22/23  Yes Rilla Baller, MD  midodrine (PROAMATINE) 2.5 MG tablet Take 2.5 mg the morning. DO NOT LAY DOWN 4 hours after taking medication 11/19/23  Yes [provider]  rosuvastatin  (CRESTOR ) 20 MG tablet Take 1 tablet (20 mg total) by mouth every other day. 09/11/23  Yes Rilla Baller, MD  XARELTO  15 MG TABS tablet TAKE 1 TABLET BY MOUTH ONCE DAILY WITH SUPPER 11/26/23  Yes Gollan, Timothy J, MD  ezetimibe  (ZETIA ) 10 MG tablet Take 1 tablet (10 mg total) by mouth daily. Patient not taking: Reported on 12/28/2023 04/17/23   Rilla Baller, MD   Recent Labs    12/28/23 1649 12/29/23 0653  WBC 6.2 5.1  HGB 11.2* 10.0*  HCT 32.5* 29.8*  PLT 142* 109*  K 3.8 3.9  CL 109 107  CO2 18* 22  BUN 17 18  CREATININE 1.05* 1.07*  GLUCOSE 91 98  CALCIUM  8.8* 8.6*   DG Knee Complete 4 Views Right Result Date: 12/28/2023 CLINICAL DATA:  RIGHT knee pain and deformity status post fall EXAM: RIGHT KNEE - COMPLETE 4+ VIEW COMPARISON:  None available FINDINGS: Diffuse osteopenia. Minimally displaced comminuted fracture of the proximal tibia extending to the tibial plateau. Moderate knee joint effusion. Atherosclerotic changes seen throughout visualized arterial segments. IMPRESSION: Minimally displaced comminuted fracture of the proximal tibia extending to the tibial plateau.  Electronically Signed   By: Aliene Lloyd M.D.   On: 12/28/2023 16:30     Positive ROS: All other systems have been reviewed and were otherwise negative with the exception of those mentioned in the HPI and as above.  Physical Exam: BP (!) 146/78 (BP Location: Left Arm)   Pulse 70   Temp (!) 97.4 F (36.3 C) (Oral)   Resp 16   Ht 5' 7 (1.702 m)   Wt 55 kg   SpO2 99%   BMI 18.99 kg/m  General:  Alert, no acute distress Psychiatric:  Patient is competent for consent with normal mood and affect    Orthopedic Exam:  RLE: 5/5 DF/PF/EHL SILT grossly over foot Foot wwp +Log roll/axial load   Imaging:  As above: R minimally displaced lateral tibial plateau fracture  Assessment/Plan: NORIE LATENDRESSE is a 89 y.o. female with a R minimally displaced lateral tibial plateau fracture   1. I discussed the various treatment options including both surgical and non-surgical management of the fracture.  Given the relatively minimally displaced nature of the fracture, we agreed that initial nonoperative management would likely be appropriate treatment surgical intervention would be much higher risk of complication for likely minimal benefit.  Patient was in agreement. 2.  Recommend continued PT/OT.  Nonweightbearing on right lower extremity.  3.  Knee immobilizer was applied.  Keep knee immobilizer on at all times except hygiene. 4.  Will plan to follow peripherally while inpatient.  She may follow-up with [facility] clinic orthopedics in approximately 1-2 weeks with repeat radiographs.  We can likely transition her to a hinged knee brace to allow for knee range of motion at that time.  Earnestine Blanch   12/30/2023 11:07 AM

## 2023-12-30 NOTE — Progress Notes (Signed)
 Physical Therapy Treatment Patient Details Name: Sierra Kaiser MRN: [id number] DOB: Dec 10, 1933 Today's Date: 12/30/2023   History of Present Illness 89 y/o female presented to ED on 12/28/23 after unwitnessed fall with R knee pain with deformity. Sustained minimally displaced comminuted fracture of proximal tibia on R. Treating non-operatively per orthopedics. PMH: HTN, CAD, CHF, Raynaud's disease, diverticulosis.    PT Comments  Patient seen for PT session focused on chair exercises to minimize R leg atrophy. Pt able to perform general LE exercises at chair level while maintaining precautions. Tolerated session well with no signs of exertion or distress. Main limiting factors today were generalize weakness and activity tolerance. Interventions aimed at improving LE strength. Continued skilled PT recommended to progress toward functional goals and support discharge readiness.    If plan is discharge home, recommend the following: A lot of help with walking and/or transfers;A lot of help with bathing/dressing/bathroom;Assistance with cooking/housework;Assist for transportation;Help with stairs or ramp for entrance   Can travel by private vehicle     No  Equipment Recommendations  Other (comment)    Recommendations for Other Services       Precautions / Restrictions Precautions Precautions: Fall Recall of Precautions/Restrictions: Intact Required Braces or Orthoses: Knee Immobilizer - Right Knee Immobilizer - Right: On at all times Restrictions Weight Bearing Restrictions Per Provider Order: Yes RLE Weight Bearing Per Provider Order: Non weight bearing     Mobility  Bed Mobility                    Transfers                        Ambulation/Gait                   Stairs             Wheelchair Mobility     Tilt Bed    Modified Rankin (Stroke Patients Only)       Balance                                             Communication Communication Communication: Impaired Factors Affecting Communication: Hearing impaired  Cognition Arousal: Alert Behavior During Therapy: WFL for tasks assessed/performed                             Following commands: Intact      Cueing Cueing Techniques: Verbal cues  Exercises General Exercises - Lower Extremity Ankle Circles/Pumps: AROM, Left, Right, 10 reps Quad Sets: AROM, Right, Left, 10 reps Gluteal Sets: AROM, Right, Left, 10 reps Other Exercises Other Exercises: Pt educated on the role of PT and the importance of movement; pt educated on weight bearing precautions    General Comments General comments (skin integrity, edema, etc.): VSS      Pertinent Vitals/Pain Pain Assessment Pain Assessment: Faces Faces Pain Scale: Hurts little more Breathing: normal Negative Vocalization: none Facial Expression: smiling or inexpressive Body Language: relaxed Consolability: no need to console PAINAD Score: 0 Pain Location: R LE, IV site on LUE Pain Descriptors / Indicators: Discomfort, Grimacing, Burning Pain Intervention(s): Limited activity within patient's tolerance, Monitored during session, Repositioned    Home Living  Prior Function            PT Goals (current goals can now be found in the care plan section) Acute Rehab PT Goals Patient Stated Goal: to get better PT Goal Formulation: With patient/family Time For Goal Achievement: 01/12/24 Potential to Achieve Goals: Fair Progress towards PT goals: Progressing toward goals    Frequency    Min 2X/week      PT Plan      Co-evaluation              AM-PAC PT 6 Clicks Mobility   Outcome Measure  Help needed turning from your back to your side while in a flat bed without using bedrails?: A Little Help needed moving from lying on your back to sitting on the side of a flat bed without using bedrails?: A Little Help needed moving to  and from a bed to a chair (including a wheelchair)?: A Little Help needed standing up from a chair using your arms (e.g., wheelchair or bedside chair)?: A Little Help needed to walk in hospital room?: Total Help needed climbing 3-5 steps with a railing? : Total 6 Click Score: 14    End of Session   Activity Tolerance: Patient tolerated treatment well Patient left: in chair;with call bell/phone within reach;with chair alarm set;with family/visitor present Nurse Communication: Mobility status PT Visit Diagnosis: Unsteadiness on feet (R26.81);Muscle weakness (generalized) (M62.81);Other abnormalities of gait and mobility (R26.89)     Time: 8598-8584 PT Time Calculation (min) (ACUTE ONLY): 14 min  Charges:    $Therapeutic Exercise: 8-22 mins PT General Charges $$ ACUTE PT VISIT: 1 Visit                     Sierra Kaiser DPT, PT     Sierra Kaiser 12/30/2023, 2:24 PM

## 2023-12-30 NOTE — Assessment & Plan Note (Signed)
 Patient was moving her left shoulder very well the other day.

## 2023-12-30 NOTE — TOC Progression Note (Signed)
 Transition of Care [facility]) - Progression Note    Patient Details  Name: Sierra Kaiser MRN: [id number] Date of Birth: Jul 17, 1933  Transition of [facility]) CM/SW Contact  Marshia Tropea L Xee Hollman, [place] Phone Number: 12/30/2023, 12:48 PM  Clinical Narrative:      CSW met with patient. Family was at bedside. Recommendations for SNF were discussed. Patient did not appear to remember prior conversation regarding SNF.   CSW asked patient questions to determine orient status. Patient was aware that she was in the hospital and she was aware of the date. Patient presented as A&O x 3. Patient acknowledged that she gets confused and she forgets.   CSW reviewed concerns and collaborated with the family to explain the reasoning behind the recommendations. Two of patients sons were in the room and they advised that it is not safe for patient to return home. Patients spouse has mobility issues of his own and can not lift patient if she falls. Patient advised that she can take care of herself. Patient was not being realistic however she demonstrated an ability to make decisions for herself.   Family pleaded with patient to go to SNF for rehab and advised her that she can return home when it is safe for her to do so.   Family urged CSW to advise patient that she needed to go to SNF. CSW advised that CSW could only reiterate what the family has advised and what has been recommended.   Patient eventually advised that she will go to rehab. Family prefers that patient remains in [place]. They advised of no facility preferences other than [facility].   FL2 will be completed.                    Expected Discharge Plan and Services                                               Social Drivers of Health (SDOH) Interventions SDOH Screenings   Food Insecurity: No Food Insecurity (12/29/2023)  Housing: Low Risk  (12/29/2023)  Transportation Needs: No Transportation Needs (12/29/2023)   Utilities: Not At Risk (12/29/2023)  Alcohol Screen: Low Risk  (03/19/2023)  Depression (PHQ2-9): Medium Risk (10/29/2023)  Financial Resource Strain: Low Risk  (03/19/2023)  Physical Activity: Inactive (03/19/2023)  Social Connections: Moderately Integrated (12/29/2023)  Stress: No Stress Concern Present (03/19/2023)  Tobacco Use: Low Risk  (12/28/2023)    Readmission Risk Interventions     No data to display

## 2023-12-30 NOTE — NC FL2 (Signed)
 [place]  MEDICAID FL2 LEVEL OF CARE FORM     IDENTIFICATION  Patient Name: Sierra Kaiser Birthdate: 1933/12/17 Sex: female Admission Date (Current Location): 12/28/2023  [facility] and [place] Number:  Chiropodist and Address:  [facility], [address] [place], [place] [address]      Provider Number: [id number]  Attending Physician Name and Address:  Josette Ade, MD  Relative Name and Phone Number:  Corean Friend [phone]    Current Level of Care: Hospital Recommended Level of Care: Skilled Nursing Facility Prior Approval Number:    Date Approved/Denied:   PASRR Number: [id number]  Discharge Plan: SNF    Current Diagnoses: Patient Active Problem List   Diagnosis Date Noted   Left shoulder pain 12/30/2023   Thrombocytopenia 12/30/2023   Closed right tibial fracture 12/28/2023   Dyslipidemia 12/28/2023   Depression 12/28/2023   Dementia without behavioral disturbance (HCC) 12/28/2023   Scratch of lower leg 11/30/2023   Pedal edema 11/30/2023   Hypotension 10/30/2023   Unintentional weight loss 10/30/2023   Acute right-sided thoracic back pain 07/28/2023   Stress due to family tension 05/23/2023   Carotid stenosis 05/23/2023   Microalbuminuria 05/08/2023   Positive occult stool blood test 08/15/2022   Peeling skin 08/14/2022   Hearing loss of right ear due to cerumen impaction 04/11/2022   Urge incontinence 02/03/2022   Lipoma 11/04/2020   Acquired hypercoagulable state 11/04/2020   Mild cognitive impairment with memory loss 07/23/2020   Injury of left rotator cuff 05/03/2020   S/P placement of cardiac pacemaker 12/04/2019   Raynaud's disease 12/02/2019   IgG lambda monoclonal gammopathy 11/08/2019   Positive ANA (antinuclear antibody) 11/08/2019   Subclinical hypothyroidism 10/30/2019   Vitamin D  deficiency 10/30/2019   Dizziness 09/24/2019   Pruritus 10/28/2018   Medicare annual wellness visit, subsequent  10/18/2018   Advanced care planning/counseling discussion 10/18/2018   Anxiety with agitation 01/28/2018   Low serum vitamin B12 11/27/2017   Right knee pain 07/17/2017   Chronic fatigue 06/13/2017   Encounter for general adult medical examination with abnormal findings 01/30/2017   Chronic constipation 01/30/2017   Osteoporosis 12/11/2015   Iron  deficiency 05/26/2015   Meniere's disease    Hyperlipidemia    CKD stage 3a, GFR 45-59 ml/min (HCC)    Mitral regurgitation    Complete heart block (HCC) 05/06/2014   Atrial fibrillation, permanent (HCC) 04/22/2014   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 Yes; Fluid consistency: Thin: Cardiac starting at 10/24 1822)  AMBULATORY STATUS COMMUNICATION OF NEEDS Skin   Extensive Assist Verbally Normal                       Personal Care Assistance Level of Assistance  Bathing, Feeding, Dressing Bathing Assistance: Maximum assistance Feeding assistance: Independent Dressing Assistance: Maximum assistance     Functional Limitations Info  Sight, Hearing, Speech          SPECIAL CARE FACTORS FREQUENCY  PT (By licensed PT), OT (By licensed OT)  PT Frequency: 2X OT Frequency: 2X            Contractures Contractures Info: Not present    Additional Factors Info  Code Status, Allergies Code Status Info: DNR-LIMITED Allergies Info: Diltiazem  HCL; Gadolinium, Media Contrast; Gabapentin; Sertraline ; Metronidazole           Current Medications  (12/30/2023):  This is the current hospital active medication list Current Facility-Administered Medications  Medication Dose Route Frequency Provider Last Rate Last Admin   acetaminophen  (TYLENOL ) tablet 650 mg  650 mg Oral Q6H PRN Mansy, Jan A, MD       Or   acetaminophen  (TYLENOL ) suppository 650 mg  650 mg Rectal Q6H PRN Mansy, Jan A, MD       cholecalciferol (VITAMIN D3) 25 MCG (1000 UNIT) tablet 1,000 Units  1,000 Units Oral Daily Mansy, Jan A, MD   1,000 Units at 12/30/23 0835   divalproex (DEPAKOTE) DR tablet 125 mg  125 mg Oral QHS Mansy, Jan A, MD   125 mg at 12/29/23 2111   donepezil  (ARICEPT ) tablet 10 mg  10 mg Oral QHS Mansy, Jan A, MD   10 mg at 12/29/23 2110   enoxaparin  (LOVENOX ) injection 40 mg  40 mg Subcutaneous Q24H Mansy, Jan A, MD   40 mg at 12/29/23 2110   ezetimibe  (ZETIA ) tablet 10 mg  10 mg Oral Daily Mansy, Jan A, MD   10 mg at 12/30/23 9167   FLUoxetine  (PROZAC ) capsule 10 mg  10 mg Oral Daily Mansy, Jan A, MD   10 mg at 12/30/23 9167   fluticasone  (FLONASE ) 50 MCG/ACT nasal spray 2 spray  2 spray Each Nare Daily Mansy, Jan A, MD   2 spray at 12/30/23 9164   Influenza vac split trivalent PF (FLUZONE HIGH-DOSE) injection 0.5 mL  0.5 mL Intramuscular Tomorrow-1000 Wieting, Richard, MD       loratadine (CLARITIN) tablet 5 mg  5 mg Oral Daily Greenwood, Howard F, RPH   5 mg at 12/30/23 9166   magnesium hydroxide (MILK OF MAGNESIA) suspension 30 mL  30 mL Oral Daily PRN Mansy, Jan A, MD       meclizine  (ANTIVERT ) tablet 12.5-25 mg  12.5-25 mg Oral BID PRN Mansy, Jan A, MD       midodrine (PROAMATINE) tablet 2.5 mg  2.5 mg Oral q AM Mansy, Jan A, MD   2.5 mg at 12/30/23 9380   morphine  (PF) 2 MG/ML injection 2 mg  2 mg Intravenous Q4H PRN Josette Ade, MD       ondansetron  (ZOFRAN ) tablet 4 mg  4 mg Oral Q6H PRN Mansy, Jan A, MD       Or   ondansetron  (ZOFRAN ) injection 4 mg  4 mg Intravenous Q6H PRN Mansy, Jan A, MD       oxyCODONE (Oxy IR/ROXICODONE) immediate  release tablet 2.5 mg  2.5 mg Oral Q6H PRN Wieting, Richard, MD       oxyCODONE (Oxy IR/ROXICODONE) immediate release tablet 5 mg  5 mg Oral Q6H PRN Josette Ade, MD   5 mg at 12/30/23 0833   polyethylene glycol (MIRALAX / GLYCOLAX) packet 17 g  17 g Oral Daily Josette Ade, MD   17 g at 12/30/23 0835   rosuvastatin  (CRESTOR ) tablet 20 mg  20 mg Oral QODAY Mansy, Jan A, MD   20 mg at 12/29/23 1019   traZODone (DESYREL) tablet 25 mg  25 mg Oral QHS PRN Mansy, Madison LABOR, MD  Discharge Medications: Please see discharge summary for a list of discharge medications.  Relevant Imaging Results:  Relevant Lab Results:   Additional Information [id number]  Sierra Kaiser L Zineb Glade, LCSW

## 2023-12-30 NOTE — Progress Notes (Signed)
 Occupational Therapy Treatment Patient Details Name: Sierra Kaiser MRN: [id number] DOB: 1933/07/21 Today's Date: 12/30/2023   History of present illness 89 y/o female presented to ED on 12/28/23 after unwitnessed fall with R knee pain with deformity. Sustained minimally displaced comminuted fracture of proximal tibia on R. Treating non-operatively per orthopedics. PMH: HTN, CAD, CHF, Raynaud's disease, diverticulosis.   OT comments  Pt is supine in bed on arrival with sister present. Pt is pleasant and agreeable to OT session. She reports mild pain to RLE. Pt performed bed mobility with Min A, verb cues for technique/sequencing. Pt demo UB bathing with supervision while seated EOB and Max/Mod A for LB bathing seated/standing at EOB. Min A for UB dressing and Max A for LB dressing. Min A x2 provided with bed height elevated and cues for NWB status of RLE, utilizing BUEs for support in standing and during pivot to recliner. Increased time and cues for overall safety. Good standing tolerance.  Pt returned to bed with all needs in place and will cont to require skilled acute OT services to maximize her safety and IND to return to PLOF.       If plan is discharge home, recommend the following:  A little help with walking and/or transfers;A lot of help with bathing/dressing/bathroom;Assistance with cooking/housework;Help with stairs or ramp for entrance;Assist for transportation   Equipment Recommendations  BSC/3in1;Wheelchair (measurements OT);Wheelchair cushion (measurements OT);Other (comment) (defer)    Recommendations for Other Services      Precautions / Restrictions Precautions Precautions: Fall Recall of Precautions/Restrictions: Intact Required Braces or Orthoses: Knee Immobilizer - Right Knee Immobilizer - Right: On at all times Restrictions Weight Bearing Restrictions Per Provider Order: Yes RLE Weight Bearing Per Provider Order: Non weight bearing       Mobility Bed  Mobility Overal bed mobility: Needs Assistance Bed Mobility: Supine to Sit     Supine to sit: Min assist, HOB elevated, Used rails          Transfers Overall transfer level: Needs assistance Equipment used: Rolling walker (2 wheels) Transfers: Sit to/from Stand, Bed to chair/wheelchair/BSC Sit to Stand: Min assist, +2 safety/equipment Stand pivot transfers: Min assist, +2 physical assistance         General transfer comment: increased time, cues for safety and sueqncing, maintaining NWB to RLE, pushing through BUEs into walker     Balance Overall balance assessment: Needs assistance Sitting-balance support: No upper extremity supported, Feet supported Sitting balance-Leahy Scale: Normal Sitting balance - Comments: steady with dynamic sitting balance/reaching outside BOS   Standing balance support: Bilateral upper extremity supported, Reliant on assistive device for balance, During functional activity Standing balance-Leahy Scale: Poor Standing balance comment: Heavy UE reliance on RW, Min A x1-2 for standing balance                           ADL either performed or assessed with clinical judgement   ADL Overall ADL's : Needs assistance/impaired     Grooming: Wash/dry face;Sitting;Set up   Upper Body Bathing: Supervision/ safety;Sitting   Lower Body Bathing: Moderate assistance;Maximal assistance;Sitting/lateral leans;Sit to/from stand   Upper Body Dressing : Minimal assistance;Sitting   Lower Body Dressing: Maximal assistance;Sit to/from stand;Sitting/lateral leans Lower Body Dressing Details (indicate cue type and reason): donn brief, able to donn L sock MOD I seated EOB, MaxA for R sock  Extremity/Trunk Assessment              Occupational Psychologist Communication: Impaired Factors Affecting Communication: Hearing impaired   Cognition Arousal: Alert Behavior During  Therapy: WFL for tasks assessed/performed                                 Following commands: Intact        Cueing   Cueing Techniques: Verbal cues  Exercises      Shoulder Instructions       General Comments VSS    Pertinent Vitals/ Pain       Pain Assessment Pain Assessment: Faces Faces Pain Scale: Hurts little more Pain Location: R LE, IV site on LUE Pain Descriptors / Indicators: Discomfort, Grimacing, Burning Pain Intervention(s): Limited activity within patient's tolerance, Monitored during session, Repositioned  Home Living                                          Prior Functioning/Environment              Frequency  Min 3X/week        Progress Toward Goals  OT Goals(current goals can now be found in the care plan section)  Progress towards OT goals: Progressing toward goals  Acute Rehab OT Goals Patient Stated Goal: get stronger OT Goal Formulation: With patient Time For Goal Achievement: 01/12/24 Potential to Achieve Goals: Good  Plan      Co-evaluation                 AM-PAC OT 6 Clicks Daily Activity     Outcome Measure   Help from another person eating meals?: A Little Help from another person taking care of personal grooming?: A Little Help from another person toileting, which includes using toliet, bedpan, or urinal?: A Lot Help from another person bathing (including washing, rinsing, drying)?: A Lot Help from another person to put on and taking off regular upper body clothing?: A Little Help from another person to put on and taking off regular lower body clothing?: A Lot 6 Click Score: 15    End of Session Equipment Utilized During Treatment: Gait belt;Rolling walker (2 wheels)  OT Visit Diagnosis: Other abnormalities of gait and mobility (R26.89);Pain Pain - Right/Left: Right Pain - part of body: Knee;Leg   Activity Tolerance Patient tolerated treatment well   Patient Left in  chair;with call bell/phone within reach;with chair alarm set;with family/visitor present   Nurse Communication Mobility status        Time: 9064-8983 OT Time Calculation (min): 41 min  Charges: OT General Charges $OT Visit: 1 Visit OT Treatments $Self Care/Home Management : 23-37 mins $Therapeutic Activity: 8-22 mins  Chiquita Heckert, OTR/L  12/30/23, 1:22 PM   Jema Deegan E Lyn Joens 12/30/2023, 1:18 PM

## 2023-12-31 ENCOUNTER — Ambulatory Visit: Admitting: Family Medicine

## 2023-12-31 DIAGNOSIS — E785 Hyperlipidemia, unspecified: Secondary | ICD-10-CM | POA: Diagnosis not present

## 2023-12-31 DIAGNOSIS — R410 Disorientation, unspecified: Secondary | ICD-10-CM | POA: Insufficient documentation

## 2023-12-31 DIAGNOSIS — S82101S Unspecified fracture of upper end of right tibia, sequela: Secondary | ICD-10-CM | POA: Diagnosis not present

## 2023-12-31 DIAGNOSIS — I959 Hypotension, unspecified: Secondary | ICD-10-CM

## 2023-12-31 DIAGNOSIS — N3000 Acute cystitis without hematuria: Secondary | ICD-10-CM | POA: Diagnosis not present

## 2023-12-31 DIAGNOSIS — D696 Thrombocytopenia, unspecified: Secondary | ICD-10-CM

## 2023-12-31 LAB — URINALYSIS, W/ REFLEX TO CULTURE (INFECTION SUSPECTED)
Bilirubin Urine: NEGATIVE
Glucose, UA: NEGATIVE mg/dL
Hgb urine dipstick: NEGATIVE
Ketones, ur: NEGATIVE mg/dL
Nitrite: NEGATIVE
Protein, ur: NEGATIVE mg/dL
Specific Gravity, Urine: 1.012 (ref 1.005–1.030)
pH: 6 (ref 5.0–8.0)

## 2023-12-31 LAB — BASIC METABOLIC PANEL WITH GFR
Anion gap: 6 (ref 5–15)
BUN: 19 mg/dL (ref 8–23)
CO2: 27 mmol/L (ref 22–32)
Calcium: 9 mg/dL (ref 8.9–10.3)
Chloride: 105 mmol/L (ref 98–111)
Creatinine, Ser: 0.88 mg/dL (ref 0.44–1.00)
GFR, Estimated: >60 mL/min (ref >60)
Glucose, Bld: 89 mg/dL (ref 70–99)
Potassium: 4.2 mmol/L (ref 3.5–5.1)
Sodium: 138 mmol/L (ref 135–145)

## 2023-12-31 LAB — CBC
HCT: 32.2 % — ABNORMAL LOW (ref 36.0–46.0)
Hemoglobin: 10.9 g/dL — ABNORMAL LOW (ref 12.0–15.0)
MCH: 32.4 pg (ref 26.0–34.0)
MCHC: 33.9 g/dL (ref 30.0–36.0)
MCV: 95.8 fL (ref 80.0–100.0)
Platelets: 134 K/uL — ABNORMAL LOW (ref 150–400)
RBC: 3.36 MIL/uL — ABNORMAL LOW (ref 3.87–5.11)
RDW: 12.9 % (ref 11.5–15.5)
WBC: 4.8 K/uL (ref 4.0–10.5)
nRBC: 0 % (ref 0.0–0.2)

## 2023-12-31 LAB — HEPATITIS C ANTIBODY: HCV Ab: NONREACTIVE

## 2023-12-31 MED ORDER — SODIUM CHLORIDE 0.9 % IV SOLN
1.0000 g | Freq: Every day | INTRAVENOUS | Status: DC
  Administered 2023-12-31 – 2024-01-02 (×3): 1 g via INTRAVENOUS
  Filled 2023-12-31 (×3): qty 10

## 2023-12-31 NOTE — Care Management Important Message (Signed)
 Important Message  Patient Details  Name: Sierra Kaiser MRN: [id number] Date of Birth: 1933-10-11   Important Message Given:  Yes - Medicare IM     Lakin Romer W, CMA 12/31/2023, 2:46 PM

## 2023-12-31 NOTE — Assessment & Plan Note (Signed)
 Patient more confused this morning than previous.  Likely secondary to urinary infection.  Will start Rocephin.

## 2023-12-31 NOTE — Progress Notes (Signed)
 Physical Therapy Treatment Patient Details Name: Sierra Kaiser MRN: [id number] DOB: 10/20/1933 Today's Date: 12/31/2023   History of Present Illness 89 y/o female presented to ED on 12/28/23 after unwitnessed fall with R knee pain with deformity. Sustained minimally displaced comminuted fracture of proximal tibia on R. Treating non-operatively per orthopedics. PMH: HTN, CAD, CHF, Raynaud's disease, diverticulosis.    PT Comments  Pt received up in recliner, family at bedside who state pt has baseline dementia, however she is more confused with hospital admission. Discussed at length with pt current POC and role of acute PT with recs for STR at d/c. Pt able to recall NWB status of R LE and follow commands with increased cues/time. Heavy assist to stand from low chair to RW, however compliant in standing with wt bearing restrictions for 1-2 minutes. No significant increase in pain. Will continue to progress per POC. Family encouraged to bring a supportive shoe in for L foot.    If plan is discharge home, recommend the following: A lot of help with walking and/or transfers;A lot of help with bathing/dressing/bathroom;Assistance with cooking/housework;Assist for transportation;Help with stairs or ramp for entrance   Can travel by private vehicle     No  Equipment Recommendations  Other (comment) (TBD at next level of care)    Recommendations for Other Services       Precautions / Restrictions Precautions Precautions: Fall Recall of Precautions/Restrictions: Intact Required Braces or Orthoses: Knee Immobilizer - Right Knee Immobilizer - Right: On at all times Restrictions Weight Bearing Restrictions Per Provider Order: Yes RLE Weight Bearing Per Provider Order: Non weight bearing     Mobility  Bed Mobility               General bed mobility comments:  (Pt up in recliner pre/post session)    Transfers Overall transfer level: Needs assistance Equipment used: Rolling walker (2  wheels) Transfers: Sit to/from Stand Sit to Stand: Mod assist (From low recliner)           General transfer comment: increased time, cues for safety and sueqncing, maintaining NWB to RLE, pushing through BUEs into walker. Heavy assist from low chair    Ambulation/Gait               General Gait Details:  (Unable at this time)   Optometrist     Tilt Bed    Modified Rankin (Stroke Patients Only)       Balance Overall balance assessment: Needs assistance Sitting-balance support: No upper extremity supported, Feet supported Sitting balance-Leahy Scale: Normal Sitting balance - Comments: steady with dynamic sitting balance/reaching outside BOS   Standing balance support: Bilateral upper extremity supported, Reliant on assistive device for balance Standing balance-Leahy Scale: Poor Standing balance comment:  (Fair static standing at RW, NWB R LE with CGA)                            Communication Communication Communication: Impaired Factors Affecting Communication: Hearing impaired  Cognition Arousal: Alert Behavior During Therapy: WFL for tasks assessed/performed   PT - Cognitive impairments: History of cognitive impairments                       PT - Cognition Comments:  (Sister states Hx of Dementia, increased with recent hospital stay) Following commands: Intact      Cueing  Cueing Techniques: Verbal cues  Exercises General Exercises - Lower Extremity Ankle Circles/Pumps: AROM, Left, 10 reps Long Arc Quad: AROM, Left, 10 reps Other Exercises Other Exercises: Pt educated on the role of PT and the importance of movement; pt educated on weight bearing precautions    General Comments General comments (skin integrity, edema, etc.):  (KI donned upon entry, pt required repeated cues to understand current situation and discuss POC.)      Pertinent Vitals/Pain Pain Assessment Pain Assessment:  Faces Faces Pain Scale: Hurts a little bit Pain Location:  (R LE with mobility) Pain Descriptors / Indicators: Discomfort Pain Intervention(s): Monitored during session    Home Living                          Prior Function            PT Goals (current goals can now be found in the care plan section) Acute Rehab PT Goals Patient Stated Goal: to get better    Frequency    Min 2X/week      PT Plan      Co-evaluation              AM-PAC PT 6 Clicks Mobility   Outcome Measure  Help needed turning from your back to your side while in a flat bed without using bedrails?: A Little Help needed moving from lying on your back to sitting on the side of a flat bed without using bedrails?: A Little Help needed moving to and from a bed to a chair (including a wheelchair)?: A Lot Help needed standing up from a chair using your arms (e.g., wheelchair or bedside chair)?: A Lot Help needed to walk in hospital room?: Total Help needed climbing 3-5 steps with a railing? : Total 6 Click Score: 12    End of Session Equipment Utilized During Treatment: Gait belt Activity Tolerance: Patient tolerated treatment well Patient left: in chair;with call bell/phone within reach;with chair alarm set;with family/visitor present Nurse Communication: Mobility status PT Visit Diagnosis: Unsteadiness on feet (R26.81);Muscle weakness (generalized) (M62.81);Other abnormalities of gait and mobility (R26.89)     Time: 8899-8876 PT Time Calculation (min) (ACUTE ONLY): 23 min  Charges:    $Therapeutic Activity: 23-37 mins PT General Charges $$ ACUTE PT VISIT: 1 Visit                    Darice Bohr, PTA  [name] 12/31/2023, 12:48 PM

## 2023-12-31 NOTE — Assessment & Plan Note (Signed)
 Urinalysis today looks positive.  Will start Rocephin.

## 2023-12-31 NOTE — TOC Progression Note (Signed)
 Transition of Care [facility]) - Progression Note    Patient Details  Name: Sierra Kaiser MRN: [id number] Date of Birth: Nov 10, 1933  Transition of [facility]) CM/SW Contact  Alvaro Louder, [place] Phone Number: 12/31/2023, 1:50 PM  Clinical Narrative:     Patient and family selected SNF [facility]. LCSWA called [employer] to start the auth.   TOC to follow for discharge                    Expected Discharge Plan and Services                                               Social Drivers of Health (SDOH) Interventions SDOH Screenings   Food Insecurity: No Food Insecurity (12/29/2023)  Housing: Low Risk  (12/29/2023)  Transportation Needs: No Transportation Needs (12/29/2023)  Utilities: Not At Risk (12/29/2023)  Alcohol Screen: Low Risk  (03/19/2023)  Depression (PHQ2-9): Medium Risk (10/29/2023)  Financial Resource Strain: Low Risk  (03/19/2023)  Physical Activity: Inactive (03/19/2023)  Social Connections: Moderately Integrated (12/29/2023)  Stress: No Stress Concern Present (03/19/2023)  Tobacco Use: Low Risk  (12/28/2023)    Readmission Risk Interventions     No data to display

## 2023-12-31 NOTE — Progress Notes (Signed)
 Occupational Therapy Treatment Patient Details Name: Sierra Kaiser MRN: [id number] DOB: 12-Feb-1934 Today's Date: 12/31/2023   History of present illness 89 y/o female presented to ED on 12/28/23 after unwitnessed fall with R knee pain with deformity. Sustained minimally displaced comminuted fracture of proximal tibia on R. Treating non-operatively per orthopedics. PMH: HTN, CAD, CHF, Raynaud's disease, diverticulosis.   OT comments  Patient seen for OT treatment on this date. Upon arrival to room patient seated in recliner, conversive and minimally agitated but moderately easily redirected, overall agreeable to treatment. Patient able to recall NWB RLE without cues. Patient performed sit<>stand from recliner with min A for lifting and cues for hand placement, noted bowel incontinence. OT obtained BSC and patient transferred to [facility] with max A to maintain NWB, patient was able to void on [facility] with increased time. In standing, patient required total A for thorough perineal hygiene, she transferred back to recliner with r/w while maintaining NWB to RLE with max A.  Patient ended treatment in recliner with chair alarm on and all needs within reach. Patient making fair progress toward goals, will continue to follow POC. Discharge recommendation remains appropriate.        If plan is discharge home, recommend the following:  A little help with walking and/or transfers;A lot of help with bathing/dressing/bathroom;Assistance with cooking/housework;Help with stairs or ramp for entrance;Assist for transportation   Equipment Recommendations  BSC/3in1;Wheelchair (measurements OT);Wheelchair cushion (measurements OT);Other (comment)    Recommendations for Other Services      Precautions / Restrictions Precautions Precautions: Fall Recall of Precautions/Restrictions: Impaired Required Braces or Orthoses: Knee Immobilizer - Right Knee Immobilizer - Right: On at all times Restrictions Weight Bearing  Restrictions Per Provider Order: Yes RLE Weight Bearing Per Provider Order: Non weight bearing       Mobility Bed Mobility                    Transfers Overall transfer level: Needs assistance Equipment used: Rolling walker (2 wheels) Transfers: Bed to chair/wheelchair/BSC Sit to Stand: Max assist Stand pivot transfers: Max assist         General transfer comment: patient with increased difficulty maintaining NWB RLE during SPT from recliner<>BSC     Balance                                           ADL either performed or assessed with clinical judgement   ADL Overall ADL's : Needs assistance/impaired                         Toilet Transfer: BSC/3in1;Maximal assistance;Rolling walker (2 wheels) Toilet Transfer Details (indicate cue type and reason): unable to                Extremity/Trunk Assessment Upper Extremity Assessment Upper Extremity Assessment: Generalized weakness            Vision       Perception     Praxis     Communication Communication Communication: Impaired Factors Affecting Communication: Hearing impaired   Cognition Arousal: Alert Behavior During Therapy: WFL for tasks assessed/performed Cognition: Cognition impaired     Awareness: Intellectual awareness impaired, Online awareness impaired       OT - Cognition Comments: tangental                 Following  commands: Intact        Cueing   Cueing Techniques: Verbal cues  Exercises      Shoulder Instructions       General Comments      Pertinent Vitals/ Pain       Pain Assessment Pain Assessment: 0-10 Pain Score: 5  Pain Location: RLE Pain Descriptors / Indicators: Discomfort Pain Intervention(s): Monitored during session  Home Living                                          Prior Functioning/Environment              Frequency  Min 3X/week        Progress Toward Goals  OT  Goals(current goals can now be found in the care plan section)  Progress towards OT goals: Progressing toward goals  Acute Rehab OT Goals Patient Stated Goal: to go home OT Goal Formulation: With patient Time For Goal Achievement: 01/12/24 Potential to Achieve Goals: Good ADL Goals Pt Will Perform Grooming: sitting;with supervision;with set-up Pt Will Perform Lower Body Dressing: sitting/lateral leans;with adaptive equipment;with min assist Pt Will Transfer to Toilet: stand pivot transfer;with supervision;with set-up;bedside commode  Plan      Co-evaluation                 AM-PAC OT 6 Clicks Daily Activity     Outcome Measure   Help from another person eating meals?: A Little Help from another person taking care of personal grooming?: A Little Help from another person toileting, which includes using toliet, bedpan, or urinal?: A Lot Help from another person bathing (including washing, rinsing, drying)?: A Lot Help from another person to put on and taking off regular upper body clothing?: A Little Help from another person to put on and taking off regular lower body clothing?: A Lot 6 Click Score: 15    End of Session Equipment Utilized During Treatment: Gait belt;Rolling walker (2 wheels)  OT Visit Diagnosis: Other abnormalities of gait and mobility (R26.89);Pain Pain - Right/Left: Right Pain - part of body: Knee;Leg   Activity Tolerance Patient tolerated treatment well   Patient Left in chair;with call bell/phone within reach;with chair alarm set;with family/visitor present   Nurse Communication          Time: 8541-8475 OT Time Calculation (min): 26 min  Charges: OT General Charges $OT Visit: 1 Visit OT Treatments $Self Care/Home Management : 23-37 mins  Rogers Clause, OT/L MSOT, 12/31/2023   [name] 12/31/2023, 3:43 PM

## 2023-12-31 NOTE — Progress Notes (Signed)
 Progress Note   Patient: Sierra Kaiser FMW:[id number] DOB: 1933-11-11 DOA: 12/28/2023     3 DOS: the patient was seen and examined on 12/31/2023   Brief hospital course: 89 y.o. female with medical history significant for hypertension, dyslipidemia, coronary artery disease, CHF, GERD, diverticulosis, PUD and Raynaud's disease, who presented to the emergency room with acute onset of accidental mechanical fall off of 3 steps of a ladder when she lost balance.  She denied any presyncope or syncope.  No head injuries.  No paresthesias or focal muscle weakness.  No tinnitus or vertigo she fell on her right knee.  No fever or chills.  No cough or wheezing or dyspnea.  No dysuria, oliguria or hematuria or flank pain.   ED Course: When she came to the ER, temperature was 96.6 and BP was 102/83 and later 148 /72.  Labs revealed mild anemia with hemoglobin 11.2 hematocrit 32.5 and no thrombocytopenia 142 with unremarkable BMP. EKG as reviewed by me : None. Imaging: 4 view right knee x-ray showed minimally displaced comminuted fracture of the proximal tibia extending to the tibial plateau.   The patient was given 4 mg of IV morphine  sulfate and 5 mg of p.o. oxycodone IR.  Dr. Tobie was notified about the patient and recommended right knee immobilizer.  She will be admitted to a medical-surgical bed for further evaluation and management.  10/25.  X-rays reviewed by orthopedic surgery and recommended nonoperative management, nonweightbearing right lower extremity and knee brace 10/26.  Patient complaining of some left shoulder pain but able to move her left arm pretty good. 10/27.  Patient a little confused this morning.  Complains of urinating a lot overnight.  Urine in the container of the pure wick looks cloudy.  Sent off a urine analysis which looks positive.  Will start her on Rocephin.  Assessment and Plan: * Closed right tibial fracture Orthopedic surgery reviewed the films and recommended  nonoperative management knee brace and nonweightbearing right lower extremity.  PT and OT consultations.  PT recommending rehab.  TOC to start insurance authorization for rehab.  Pain control.  Switch to Tylenol  as quickly as possible.  Acute cystitis without hematuria Urinalysis today looks positive.  Will start Rocephin.  Dyslipidemia On Zetia  and Crestor   Confusion Patient more confused this morning than previous.  Likely secondary to urinary infection.  Will start Rocephin.  Thrombocytopenia Continue to monitor.  Hepatitis C negative.  Left shoulder pain Patient moving her left shoulder very well today.  Dementia without behavioral disturbance (HCC) Continue Aricept .  Depression Continue Prozac  and Depakote.  Hypotension On midodrine in the morning  CKD stage 3a, GFR 45-59 ml/min (HCC) Today's creatinine 0.88 with a GFR greater than 60        Subjective: Patient complains of a lot of urination overnight.  Little confused today.  Not having any pain.  Admitted after a fall found to have a tibial fracture.  Physical Exam: Vitals:   12/30/23 1548 12/30/23 1956 12/31/23 0448 12/31/23 0757  BP: (!) 139/50 (!) 152/62 (!) 140/52 (!) 159/63  Pulse: 72 73 69 71  Resp: 16 16 19 20   Temp: 98.1 F (36.7 C) 98.2 F (36.8 C) 97.7 F (36.5 C) 97.6 F (36.4 C)  TempSrc: Oral   Oral  SpO2: 100% 100% 98% 99%  Weight:      Height:       Physical Exam HENT:     Head: Normocephalic.     Mouth/Throat:  Pharynx: No oropharyngeal exudate.  Eyes:     General: Lids are normal.     Conjunctiva/sclera: Conjunctivae normal.  Cardiovascular:     Rate and Rhythm: Normal rate and regular rhythm.     Heart sounds: Normal heart sounds, S1 normal and S2 normal.  Pulmonary:     Breath sounds: No decreased breath sounds, wheezing, rhonchi or rales.  Abdominal:     Palpations: Abdomen is soft.     Tenderness: There is no abdominal tenderness.  Musculoskeletal:     Comments: Able  to move left shoulder pretty good.  Skin:    General: Skin is warm.     Findings: No rash.  Neurological:     Mental Status: She is confused.     Comments: Able to wiggle toes and flex extend at the ankle     Data Reviewed: Urine analysis from yesterday had 6-10 squamous cells so this is a negative urine analysis Today's urine analysis showed 0-5 squamous cells, 6-10 white blood cells positive leukocytes.  Will start empiric Rocephin.  Family Communication: Left message for sister  Disposition: Status is: Inpatient Remains inpatient appropriate because: TOC starting insurance authorization for rehab will treat urinary infection with IV Rocephin  Planned Discharge Destination: Rehab    Time spent: 28 minutes  Author: Charlie Patterson, MD 12/31/2023 2:05 PM  For on call review [email or web address].

## 2024-01-01 DIAGNOSIS — E785 Hyperlipidemia, unspecified: Secondary | ICD-10-CM | POA: Diagnosis not present

## 2024-01-01 DIAGNOSIS — N3000 Acute cystitis without hematuria: Secondary | ICD-10-CM | POA: Diagnosis not present

## 2024-01-01 DIAGNOSIS — S82101G Unspecified fracture of upper end of right tibia, subsequent encounter for closed fracture with delayed healing: Secondary | ICD-10-CM

## 2024-01-01 DIAGNOSIS — N1831 Chronic kidney disease, stage 3a: Secondary | ICD-10-CM | POA: Diagnosis not present

## 2024-01-01 MED ORDER — ACETAMINOPHEN 650 MG RE SUPP: 650.0000 mg | Freq: Four times a day (QID) | RECTAL | Status: DC | PRN

## 2024-01-01 MED ORDER — OXYCODONE HCL 5 MG PO TABS: 2.5000 mg | ORAL_TABLET | Freq: Four times a day (QID) | ORAL | Status: DC | PRN

## 2024-01-01 MED ORDER — ACETAMINOPHEN 325 MG PO TABS
650.0000 mg | ORAL_TABLET | Freq: Four times a day (QID) | ORAL | Status: DC | PRN
  Administered 2024-01-01: 650 mg via ORAL
  Filled 2024-01-01: qty 2

## 2024-01-01 NOTE — Progress Notes (Signed)
 Progress Note   Patient: Sierra Kaiser FMW:[id number] DOB: October 24, 1933 DOA: 12/28/2023     4 DOS: the patient was seen and examined on 01/01/2024   Brief hospital course: 89 y.o. female with medical history significant for hypertension, dyslipidemia, coronary artery disease, CHF, GERD, diverticulosis, PUD and Raynaud's disease, who presented to the emergency room with acute onset of accidental mechanical fall off of 3 steps of a ladder when she lost balance.  She denied any presyncope or syncope.  No head injuries.  No paresthesias or focal muscle weakness.  No tinnitus or vertigo she fell on her right knee.  No fever or chills.  No cough or wheezing or dyspnea.  No dysuria, oliguria or hematuria or flank pain.   ED Course: When she came to the ER, temperature was 96.6 and BP was 102/83 and later 148 /72.  Labs revealed mild anemia with hemoglobin 11.2 hematocrit 32.5 and no thrombocytopenia 142 with unremarkable BMP. EKG as reviewed by me : None. Imaging: 4 view right knee x-ray showed minimally displaced comminuted fracture of the proximal tibia extending to the tibial plateau.   The patient was given 4 mg of IV morphine  sulfate and 5 mg of p.o. oxycodone IR.  Dr. Tobie was notified about the patient and recommended right knee immobilizer.  She will be admitted to a medical-surgical bed for further evaluation and management.  10/25.  X-rays reviewed by orthopedic surgery and recommended nonoperative management, nonweightbearing right lower extremity and knee brace 10/26.  Patient complaining of some left shoulder pain but able to move her left arm pretty good. 10/27.  Patient a little confused this morning.  Complains of urinating a lot overnight.  Urine in the container of the pure wick looks cloudy.  Sent off a urine analysis which looks positive.  Will start her on Rocephin.  Assessment and Plan: * Closed right tibial fracture Orthopedic surgery reviewed the films and recommended  nonoperative management knee brace and nonweightbearing right lower extremity.  PT and OT consultations.  PT recommending rehab.  Peer to peer for insurance company done but I did not hear back on the results (the physician needed to speak with family first).  Tylenol  for mild and moderate pain.  Only severe pain will do oxycodone.  Acute cystitis without hematuria Started Rocephin on 10/27.  E. coli growing out of urine culture.  Dyslipidemia On Zetia  and Crestor   Confusion Improved today.  Able to answer questions better.  Started on Rocephin for acute cystitis on 10/27.  Thrombocytopenia Continue to monitor.  Hepatitis C negative.  Left shoulder pain Patient was moving her left shoulder very well the other day.  Dementia without behavioral disturbance (HCC) Continue Aricept .  Depression Continue Prozac  and Depakote.  Hypotension On midodrine in the morning for orthostatic hypotension  CKD stage 3a, GFR 45-59 ml/min (HCC) Last creatinine 0.88 with a GFR greater than 60        Subjective: Patient able to hold a conversation but states she is confused.  Started Rocephin for urinary tract infection yesterday.  Admitted after a fall and found to have a tibial fracture.  Physical Exam: Vitals:   12/31/23 1550 12/31/23 2009 01/01/24 0435 01/01/24 0809  BP: (!) 144/64 (!) 140/59 136/62 (!) 156/66  Pulse: 80 74 70 70  Resp: 18 17 17 17   Temp: 98.1 F (36.7 C) 98.1 F (36.7 C) 98.1 F (36.7 C) 97.7 F (36.5 C)  TempSrc: Oral Oral Oral Oral  SpO2: (!) 89% 98% 99%  97%  Weight:      Height:       Physical Exam HENT:     Head: Normocephalic.     Mouth/Throat:     Pharynx: No oropharyngeal exudate.  Eyes:     General: Lids are normal.     Conjunctiva/sclera: Conjunctivae normal.  Cardiovascular:     Rate and Rhythm: Normal rate and regular rhythm.     Heart sounds: Normal heart sounds, S1 normal and S2 normal.  Pulmonary:     Breath sounds: No decreased breath  sounds, wheezing, rhonchi or rales.  Abdominal:     Palpations: Abdomen is soft.     Tenderness: There is no abdominal tenderness.  Skin:    General: Skin is warm.     Findings: No rash.  Neurological:     Mental Status: She is alert.     Comments: Able to wiggle toes and flex extend at the ankle.  Able to hold conversation.     Data Reviewed: No new data today  Family Communication: Spoke with sister at the bedside  Disposition: Status is: Inpatient Remains inpatient appropriate because: Did peer to peer with physician but did not hear the results, he wanted to speak with family first.  Planned Discharge Destination: Skilled nursing facility    Time spent: 28 minutes  Author: Charlie Patterson, MD 01/01/2024 2:53 PM  For on call review [email or web address].

## 2024-01-01 NOTE — Progress Notes (Signed)
 PT Cancellation Note  Patient Details Name: Sierra Kaiser MRN: [id number] DOB: 04-May-1933   Cancelled Treatment:     PT attempted prior to lunch, pt politely declined, stating she didn't feel like mobilizing. Returned in pm, pt had returned to bed. Will re-attempt at a later date per POC.     [name] 01/01/2024, 2:23 PM

## 2024-01-01 NOTE — Progress Notes (Signed)
 Occupational Therapy Treatment Patient Details Name: Sierra Kaiser MRN: [id number] DOB: 1933/07/05 Today's Date: 01/01/2024   History of present illness 89 y/o female presented to ED on 12/28/23 after unwitnessed fall with R knee pain with deformity. Sustained minimally displaced comminuted fracture of proximal tibia on R. Treating non-operatively per orthopedics. PMH: HTN, CAD, CHF, Raynaud's disease, diverticulosis.   OT comments  Patient seen for OT treatment on this date. Upon arrival to room patient sitting in recliner with family present, agreeable to treatment. Patient agreeable to work on [facility] transfers (see below for level of assist). While on [facility], patient required increased time to have BM. Patient not safe to perform SPT from surface to surface, arm rest removed on recliner and patietn performed squat pivot transfer from [facility] to recliner and then recliner to bed. RN Darice present at this time, discussed recommended transfer technique with RN, RN agreeable. Patient tolerated standing for perineal hygiene ~1 minute with constant steadying A. She continues to report I'm just confused but is receptive to education on NWB RLE, she just has difficulty maintaining for transfers.  Once in bed, patient able to use bed rails to pull self up in bed with increased effort. Patient ended treatment in bed with RN present. Patient making good progress toward goals, will continue to follow POC. Discharge recommendation remains appropriate.        If plan is discharge home, recommend the following:  A little help with walking and/or transfers;A lot of help with bathing/dressing/bathroom;Assistance with cooking/housework;Help with stairs or ramp for entrance;Assist for transportation   Equipment Recommendations  BSC/3in1;Wheelchair (measurements OT);Wheelchair cushion (measurements OT);Other (comment)    Recommendations for Other Services      Precautions / Restrictions Precautions Precautions:  Fall Recall of Precautions/Restrictions: Impaired Required Braces or Orthoses: Knee Immobilizer - Right Knee Immobilizer - Right: On at all times Restrictions Weight Bearing Restrictions Per Provider Order: Yes RLE Weight Bearing Per Provider Order: Non weight bearing       Mobility Bed Mobility Overal bed mobility: Needs Assistance Bed Mobility: Sit to Supine       Sit to supine: Mod assist   General bed mobility comments: A to lift BLE into bed, patient able to use bed rails to pull self up higher in bed    Transfers Overall transfer level: Needs assistance Equipment used: Rolling walker (2 wheels) Transfers: Bed to chair/wheelchair/BSC Sit to Stand: Max assist Stand pivot transfers: Max assist         General transfer comment: patient unable to safely transfer while maintaining NWB to RLE, OT removed arm rest of recliner and patient more safely able to perform a squat pivot transfer from recliner<>BSC and recliner to bed     Balance                                           ADL either performed or assessed with clinical judgement   ADL Overall ADL's : Needs assistance/impaired                         Toilet Transfer: Maximal assistance;BSC/3in1;Rolling walker (2 wheels)   Toileting- Clothing Manipulation and Hygiene: Maximal assistance;+2 for physical assistance              Extremity/Trunk Assessment Upper Extremity Assessment Upper Extremity Assessment: Overall WFL for tasks assessed  Vision       Perception     Praxis     Communication Communication Communication: Impaired Factors Affecting Communication: Hearing impaired   Cognition Arousal: Alert Behavior During Therapy: WFL for tasks assessed/performed Cognition: Cognition impaired   Orientation impairments: Situation, Time Awareness: Intellectual awareness impaired, Online awareness impaired                         Following  commands: Intact        Cueing   Cueing Techniques: Verbal cues  Exercises      Shoulder Instructions       General Comments      Pertinent Vitals/ Pain       Pain Assessment Pain Assessment: No/denies pain  Home Living                                          Prior Functioning/Environment              Frequency  Min 3X/week        Progress Toward Goals  OT Goals(current goals can now be found in the care plan section)  Progress towards OT goals: Progressing toward goals  Acute Rehab OT Goals Patient Stated Goal: use the bathroom OT Goal Formulation: With patient Time For Goal Achievement: 01/12/24 Potential to Achieve Goals: Good ADL Goals Pt Will Perform Grooming: sitting;with supervision;with set-up Pt Will Perform Lower Body Dressing: sitting/lateral leans;with adaptive equipment;with min assist Pt Will Transfer to Toilet: stand pivot transfer;with supervision;with set-up;bedside commode  Plan      Co-evaluation                 AM-PAC OT 6 Clicks Daily Activity     Outcome Measure   Help from another person eating meals?: A Little Help from another person taking care of personal grooming?: A Little Help from another person toileting, which includes using toliet, bedpan, or urinal?: A Lot Help from another person bathing (including washing, rinsing, drying)?: A Lot Help from another person to put on and taking off regular upper body clothing?: A Little Help from another person to put on and taking off regular lower body clothing?: A Lot 6 Click Score: 15    End of Session Equipment Utilized During Treatment: Gait belt;Rolling walker (2 wheels)  OT Visit Diagnosis: Other abnormalities of gait and mobility (R26.89);Pain Pain - Right/Left: Right Pain - part of body: Knee;Leg   Activity Tolerance Patient tolerated treatment well   Patient Left in bed;with nursing/sitter in room   Nurse Communication Mobility  status        Time: 8697-8670 OT Time Calculation (min): 27 min  Charges: OT General Charges $OT Visit: 1 Visit OT Treatments $Self Care/Home Management : 23-37 mins  Rogers Clause, OT/L MSOT, 01/01/2024

## 2024-01-01 NOTE — Plan of Care (Signed)
 Patient is waiting placement

## 2024-01-01 NOTE — TOC Progression Note (Addendum)
 Transition of Care [facility]) - Progression Note    Patient Details  Name: Sierra Kaiser MRN: [id number] Date of Birth: 03-06-1934  Transition of [facility] CM/SW Contact  Marinda Cooks, RN Phone Number: 01/01/2024, 4:26 PM  Clinical Narrative:    This CM called  and spoke with Damien  with pt's insurance  and informed ins auth approved for 7 days for [facility] the ins shara is [id number] . Pt's ambulance auth is [id number]. This CM updated admission liaison at [facility] and provided ins auth number . This CM confirmed pt can be received to[facility] . TOC will cont to follow dc planning / care coordination and update as applicable.                    Expected Discharge Plan and Services         Expected Discharge Date: 01/01/24                                     Social Drivers of Health (SDOH) Interventions SDOH Screenings   Food Insecurity: No Food Insecurity (12/29/2023)  Housing: Low Risk  (12/29/2023)  Transportation Needs: No Transportation Needs (12/29/2023)  Utilities: Not At Risk (12/29/2023)  Alcohol Screen: Low Risk  (03/19/2023)  Depression (PHQ2-9): Medium Risk (10/29/2023)  Financial Resource Strain: Low Risk  (03/19/2023)  Physical Activity: Inactive (03/19/2023)  Social Connections: Moderately Integrated (12/29/2023)  Stress: No Stress Concern Present (03/19/2023)  Tobacco Use: Low Risk  (12/28/2023)    Readmission Risk Interventions     No data to display

## 2024-01-02 DIAGNOSIS — I4891 Unspecified atrial fibrillation: Secondary | ICD-10-CM | POA: Diagnosis not present

## 2024-01-02 DIAGNOSIS — Z950 Presence of cardiac pacemaker: Secondary | ICD-10-CM | POA: Diagnosis not present

## 2024-01-02 DIAGNOSIS — M25511 Pain in right shoulder: Secondary | ICD-10-CM | POA: Diagnosis not present

## 2024-01-02 DIAGNOSIS — N1831 Chronic kidney disease, stage 3a: Secondary | ICD-10-CM | POA: Diagnosis not present

## 2024-01-02 DIAGNOSIS — I951 Orthostatic hypotension: Secondary | ICD-10-CM | POA: Diagnosis not present

## 2024-01-02 DIAGNOSIS — W11XXXA Fall on and from ladder, initial encounter: Secondary | ICD-10-CM | POA: Diagnosis not present

## 2024-01-02 DIAGNOSIS — E559 Vitamin D deficiency, unspecified: Secondary | ICD-10-CM | POA: Diagnosis not present

## 2024-01-02 DIAGNOSIS — S82201A Unspecified fracture of shaft of right tibia, initial encounter for closed fracture: Secondary | ICD-10-CM | POA: Diagnosis not present

## 2024-01-02 DIAGNOSIS — M898X6 Other specified disorders of bone, lower leg: Secondary | ICD-10-CM | POA: Diagnosis not present

## 2024-01-02 DIAGNOSIS — Z23 Encounter for immunization: Secondary | ICD-10-CM | POA: Diagnosis present

## 2024-01-02 DIAGNOSIS — E785 Hyperlipidemia, unspecified: Secondary | ICD-10-CM | POA: Diagnosis not present

## 2024-01-02 DIAGNOSIS — Y92008 Other place in unspecified non-institutional (private) residence as the place of occurrence of the external cause: Secondary | ICD-10-CM | POA: Diagnosis not present

## 2024-01-02 DIAGNOSIS — K219 Gastro-esophageal reflux disease without esophagitis: Secondary | ICD-10-CM | POA: Diagnosis not present

## 2024-01-02 DIAGNOSIS — S82144D Nondisplaced bicondylar fracture of right tibia, subsequent encounter for closed fracture with routine healing: Secondary | ICD-10-CM | POA: Diagnosis not present

## 2024-01-02 DIAGNOSIS — Z7401 Bed confinement status: Secondary | ICD-10-CM | POA: Diagnosis not present

## 2024-01-02 DIAGNOSIS — I5032 Chronic diastolic (congestive) heart failure: Secondary | ICD-10-CM | POA: Diagnosis not present

## 2024-01-02 DIAGNOSIS — I442 Atrioventricular block, complete: Secondary | ICD-10-CM | POA: Diagnosis not present

## 2024-01-02 DIAGNOSIS — M545 Low back pain, unspecified: Secondary | ICD-10-CM | POA: Diagnosis not present

## 2024-01-02 DIAGNOSIS — S82201D Unspecified fracture of shaft of right tibia, subsequent encounter for closed fracture with routine healing: Secondary | ICD-10-CM | POA: Diagnosis not present

## 2024-01-02 DIAGNOSIS — F32A Depression, unspecified: Secondary | ICD-10-CM | POA: Diagnosis not present

## 2024-01-02 DIAGNOSIS — R41841 Cognitive communication deficit: Secondary | ICD-10-CM | POA: Diagnosis not present

## 2024-01-02 DIAGNOSIS — M6259 Muscle wasting and atrophy, not elsewhere classified, multiple sites: Secondary | ICD-10-CM | POA: Diagnosis not present

## 2024-01-02 DIAGNOSIS — I2510 Atherosclerotic heart disease of native coronary artery without angina pectoris: Secondary | ICD-10-CM | POA: Diagnosis not present

## 2024-01-02 DIAGNOSIS — N3000 Acute cystitis without hematuria: Secondary | ICD-10-CM | POA: Diagnosis not present

## 2024-01-02 DIAGNOSIS — F0390 Unspecified dementia without behavioral disturbance: Secondary | ICD-10-CM | POA: Diagnosis not present

## 2024-01-02 DIAGNOSIS — R2681 Unsteadiness on feet: Secondary | ICD-10-CM | POA: Diagnosis not present

## 2024-01-02 DIAGNOSIS — D696 Thrombocytopenia, unspecified: Secondary | ICD-10-CM | POA: Diagnosis not present

## 2024-01-02 LAB — URINE CULTURE: Culture: >=100000 — AB

## 2024-01-02 MED ORDER — ACETAMINOPHEN 325 MG PO TABS: 650.0000 mg | ORAL_TABLET | Freq: Four times a day (QID) | ORAL | 0 refills | Status: AC | PRN

## 2024-01-02 MED ORDER — OXYCODONE HCL 5 MG PO TABS: 2.5000 mg | ORAL_TABLET | Freq: Four times a day (QID) | ORAL | 0 refills | Status: AC | PRN

## 2024-01-02 MED ORDER — NITROFURANTOIN MONOHYD MACRO 100 MG PO CAPS: 100.0000 mg | ORAL_CAPSULE | Freq: Two times a day (BID) | ORAL | 0 refills | Status: AC

## 2024-01-02 NOTE — Plan of Care (Signed)
  Problem: Safety: Goal: Ability to remain free from injury will improve Outcome: Progressing   Problem: Pain Managment: Goal: General experience of comfort will improve and/or be controlled Outcome: Progressing   Problem: Elimination: Goal: Will not experience complications related to bowel motility Outcome: Progressing   Problem: Health Behavior/Discharge Planning: Goal: Ability to manage health-related needs will improve Outcome: Progressing

## 2024-01-02 NOTE — Progress Notes (Signed)
 Pt is alert with some confusion at baseline. Sister at bedside. Pt is discharging to [facility].  Transportation will be picking up pt via stretcher. She is able to stand and pivot with assistance and walker.  Right IV forearm removed with catheter and tip intact. Gauze and tape applied to site.   Pure wick will be removed when transportation arrives.

## 2024-01-02 NOTE — Progress Notes (Signed)
 Transportation is here for pick up to [facility]. Copies of face sheet and DNR provided. Report called in to nurse Bobbi.

## 2024-01-02 NOTE — Discharge Summary (Signed)
 Physician Discharge Summary   Patient: Sierra Kaiser MRN: [id number] DOB: [birth year]  Admit date:     12/28/2023  Discharge date: 01/02/24  Discharge Physician: Sierra Kaiser   PCP: Sierra Baller, MD   Recommendations at discharge:   Follow up with PCP in 2 weeks - Repeat BMP, CBC Follow up with Ortho in 1-2 weeks   Discharge Diagnoses: Principal Problem:   Closed right tibial fracture Active Problems:   Acute cystitis without hematuria   Dyslipidemia   CKD stage 3a, GFR 45-59 ml/min (HCC)   Hypotension   Depression   Dementia without behavioral disturbance (HCC)   Left shoulder pain   Thrombocytopenia   Confusion   Hospital Course:  89 y.o. female with medical history significant for hypertension, dyslipidemia, coronary artery disease, CHF, GERD, diverticulosis, PUD and Raynaud's disease, who presented to the emergency room with acute onset of accidental mechanical fall off of 3 steps of a ladder when she lost balance. Denied LOC. Hospital course as below    Closed right tibial fracture - Orthopedic surgery reviewed the films and recommended nonoperative management knee brace and nonweightbearing right lower extremity, knee brace - PT/OT rec SNF - Tylenol , Oxycodone prn for pain  Acute cystitis without hematuria - was on Rocephin on 10/27.  E. coli pansensitive on Urine culture - discharge on Macrobid to complete 5 days   Dyslipidemia - On Zetia  and Crestor    Confusion - Improved, at baseline. Has dementia   Thrombocytopenia - Improved   Left shoulder pain - ROM normal. Pain improving, likely MSK s/p Fall   Dementia without behavioral disturbance - Continue Aricept .   Depression - Continue Prozac  and Depakote.   Hypotension - On midodrine in the morning for orthostatic hypotension   CKD stage 3a, GFR 45-59 ml/min (HCC)   -- Discussed plan and questions with sister at the bedside  Pain control - [place]  Controlled Substance Reporting  System database was reviewed. and patient was instructed, not to drive, operate heavy machinery, perform activities at heights, swimming or participation in water activities or provide baby-sitting services while on Pain, Sleep and Anxiety Medications; until their outpatient Physician has advised to do so again. Also recommended to not to take more than prescribed Pain, Sleep and Anxiety Medications.  Consultants: Orthopedics Procedures performed: None  Disposition: Skilled nursing facility Diet recommendation:  Discharge Diet Orders (From admission, onward)     Start     Ordered   01/02/24 0000  Diet - low sodium heart healthy        01/02/24 1146           DISCHARGE MEDICATION: Allergies as of 01/02/2024       Reactions   Gadolinium Derivatives Itching, Rash   Persisted for months   Contrast Media [iodinated Contrast Media] Hives      Gabapentin Other (See Comments)   Affected mood bad   Metronidazole Itching, Other (See Comments)   Hands really red   Other    Sertraline  Other (See Comments)   tremors   Diltiazem  Hcl Itching, Rash        Medication List     TAKE these medications    acetaminophen  325 MG tablet Commonly known as: TYLENOL  Take 2 tablets (650 mg total) by mouth every 6 (six) hours as needed for mild pain (pain score 1-3), fever or moderate pain (pain score 4-6) (or Fever >/= 101).   Depakote 125 MG Sierra tablet Generic drug: divalproex Take 1 tablet (125 mg total)  by mouth at bedtime. Through neurology Sierra Kaiser   donepezil  10 MG tablet Commonly known as: ARICEPT  Take 1 tablet (10 mg total) by mouth at bedtime.   ezetimibe  10 MG tablet Commonly known as: ZETIA  Take 1 tablet (10 mg total) by mouth daily.   FLUoxetine  10 MG capsule Commonly known as: PROZAC  Take 1 capsule (10 mg total) by mouth daily.   fluticasone  50 MCG/ACT nasal spray Commonly known as: FLONASE  Place 2 sprays into both nostrils daily.   meclizine  25 MG tablet Commonly  known as: ANTIVERT  Take 0.5-1 tablets (12.5-25 mg total) by mouth 2 (two) times daily as needed for dizziness.   midodrine 2.5 MG tablet Commonly known as: PROAMATINE Take 2.5 mg the morning. DO NOT LAY DOWN 4 hours after taking medication   nitrofurantoin (macrocrystal-monohydrate) 100 MG capsule Commonly known as: Macrobid Take 1 capsule (100 mg total) by mouth 2 (two) times daily for 2 days. Start taking on: January 03, 2024   oxyCODONE 5 MG immediate release tablet Commonly known as: Oxy IR/ROXICODONE Take 0.5-1 tablets (2.5-5 mg total) by mouth every 6 (six) hours as needed for severe pain (pain score 7-10) or moderate pain (pain score 4-6).   rosuvastatin  20 MG tablet Commonly known as: CRESTOR  Take 1 tablet (20 mg total) by mouth every other day.   Vitamin D3 25 MCG (1000 UT) Caps Take 1 capsule (1,000 Units total) by mouth daily.   Xarelto  15 MG Tabs tablet Generic drug: Rivaroxaban  TAKE 1 TABLET BY MOUTH ONCE DAILY WITH SUPPER   ZYRTEC PO Take by mouth.        Contact information for after-discharge care     Destination     [facility] [place], [place]. Sierra Kaiser   Service: Skilled Nursing Contact information: [address] Sierra Kaiser  [phone]                    Discharge Exam: Sierra Kaiser   12/28/23 1828  Weight: 55 kg    Cardiovascular:     Rate and Rhythm: Normal rate and regular rhythm.     Heart sounds: Normal heart sounds, S1 normal and S2 normal.  Pulmonary:     Breath sounds: No decreased breath sounds, wheezing, rhonchi or rales.  Abdominal:     Palpations: Abdomen is soft.     Tenderness: There is no abdominal tenderness.  Skin:    General: Skin is warm.     Findings: No rash.  Neurological:     Mental Status: She is alert, answering simple questions    Comments: Able to wiggle toes and flex extend at the ankle.  Able to hold conversation  Condition at discharge: fair  The results of significant  diagnostics from this hospitalization (including imaging, microbiology, ancillary and laboratory) are listed below for reference.   Imaging Studies: DG Knee Complete 4 Views Right Result Date: 12/28/2023 CLINICAL DATA:  RIGHT knee pain and deformity status post fall EXAM: RIGHT KNEE - COMPLETE 4+ VIEW COMPARISON:  None available FINDINGS: Diffuse osteopenia. Minimally displaced comminuted fracture of the proximal tibia extending to the tibial plateau. Moderate knee joint effusion. Atherosclerotic changes seen throughout visualized arterial segments. IMPRESSION: Minimally displaced comminuted fracture of the proximal tibia extending to the tibial plateau. Electronically Signed   By: Aliene Lloyd M.D.   On: 12/28/2023 16:30    Microbiology: Results for orders placed or performed during the hospital encounter of 12/28/23  Urine Culture (for pregnant, neutropenic or urologic patients or  patients with an indwelling urinary catheter)     Status: Abnormal   Collection Time: 12/31/23 10:20 AM   Specimen: Urine, Clean Catch  Result Value Ref Range Status   Specimen Description   Final    URINE, CLEAN CATCH Performed at [facility], [address] [place], [place] [address]    Special Requests   Final    NONE Performed at [facility], [address] [place], [place] [address]    Culture >=100,000 COLONIES/mL ESCHERICHIA COLI (A)  Final   Report Status 01/02/2024 FINAL  Final   Organism ID, Bacteria ESCHERICHIA COLI (A)  Final      Susceptibility   Escherichia coli - MIC*    AMPICILLIN <=2 SENSITIVE Sensitive     CEFAZOLIN  (URINE) Value in next row Sensitive      <=1 SENSITIVEThis is a modified FDA-approved test that has been validated and its performance characteristics determined by the reporting laboratory.  This laboratory is certified under the Clinical Laboratory Improvement Amendments CLIA as qualified to perform high complexity clinical laboratory testing.     CEFEPIME Value in next row Sensitive      <=1 SENSITIVEThis is a modified FDA-approved test that has been validated and its performance characteristics determined by the reporting laboratory.  This laboratory is certified under the Clinical Laboratory Improvement Amendments CLIA as qualified to perform high complexity clinical laboratory testing.    ERTAPENEM Value in next row Sensitive      <=1 SENSITIVEThis is a modified FDA-approved test that has been validated and its performance characteristics determined by the reporting laboratory.  This laboratory is certified under the Clinical Laboratory Improvement Amendments CLIA as qualified to perform high complexity clinical laboratory testing.    CEFTRIAXONE Value in next row Sensitive      <=1 SENSITIVEThis is a modified FDA-approved test that has been validated and its performance characteristics determined by the reporting laboratory.  This laboratory is certified under the Clinical Laboratory Improvement Amendments CLIA as qualified to perform high complexity clinical laboratory testing.    CIPROFLOXACIN  Value in next row Sensitive      <=1 SENSITIVEThis is a modified FDA-approved test that has been validated and its performance characteristics determined by the reporting laboratory.  This laboratory is certified under the Clinical Laboratory Improvement Amendments CLIA as qualified to perform high complexity clinical laboratory testing.    GENTAMICIN  Value in next row Sensitive      <=1 SENSITIVEThis is a modified FDA-approved test that has been validated and its performance characteristics determined by the reporting laboratory.  This laboratory is certified under the Clinical Laboratory Improvement Amendments CLIA as qualified to perform high complexity clinical laboratory testing.    NITROFURANTOIN Value in next row Sensitive      <=1 SENSITIVEThis is a modified FDA-approved test that has been validated and its performance characteristics  determined by the reporting laboratory.  This laboratory is certified under the Clinical Laboratory Improvement Amendments CLIA as qualified to perform high complexity clinical laboratory testing.    TRIMETH/SULFA Value in next row Sensitive      <=1 SENSITIVEThis is a modified FDA-approved test that has been validated and its performance characteristics determined by the reporting laboratory.  This laboratory is certified under the Clinical Laboratory Improvement Amendments CLIA as qualified to perform high complexity clinical laboratory testing.    AMPICILLIN/SULBACTAM Value in next row Sensitive      <=1 SENSITIVEThis is a modified FDA-approved test that has been validated and its performance  characteristics determined by the reporting laboratory.  This laboratory is certified under the Clinical Laboratory Improvement Amendments CLIA as qualified to perform high complexity clinical laboratory testing.    PIP/TAZO Value in next row Sensitive      <=4 SENSITIVEThis is a modified FDA-approved test that has been validated and its performance characteristics determined by the reporting laboratory.  This laboratory is certified under the Clinical Laboratory Improvement Amendments CLIA as qualified to perform high complexity clinical laboratory testing.    MEROPENEM Value in next row Sensitive      <=4 SENSITIVEThis is a modified FDA-approved test that has been validated and its performance characteristics determined by the reporting laboratory.  This laboratory is certified under the Clinical Laboratory Improvement Amendments CLIA as qualified to perform high complexity clinical laboratory testing.    * >=100,000 COLONIES/mL ESCHERICHIA COLI    Labs: CBC: Recent Labs  Lab 12/28/23 1649 12/29/23 0653 12/31/23 0345  WBC 6.2 5.1 4.8  NEUTROABS 4.7  --   --   HGB 11.2* 10.0* 10.9*  HCT 32.5* 29.8* 32.2*  MCV 94.8 97.4 95.8  PLT 142* 109* 134*   Basic Metabolic Panel: Recent Labs  Lab  12/28/23 1649 12/29/23 0653 12/31/23 0345  NA 142 140 138  K 3.8 3.9 4.2  CL 109 107 105  CO2 18* 22 27  GLUCOSE 91 98 89  BUN 17 18 19   CREATININE 1.05* 1.07* 0.88  CALCIUM  8.8* 8.6* 9.0   Liver Function Tests: No results for input(s): AST, ALT, ALKPHOS, BILITOT, PROT, ALBUMIN in the last 168 hours. CBG: No results for input(s): GLUCAP in the last 168 hours.  Discharge time spent: greater than 30 minutes.  Signed: Laree Lock, MD [facility] 01/02/2024

## 2024-01-02 NOTE — TOC Transition Note (Signed)
 Transition of Care [facility]) - Discharge Note   Patient Details  Name: ALAISA MOFFITT MRN: [id number] Date of Birth: Nov 28, 1933  [facility] CM/SW Contact:  Marinda Cooks, RN Phone Number: 01/02/2024, 11:14 AM   Clinical Narrative:    This CM updated by covering MD pt medically cleared to dc today and has active DC order . This CM spoke with  Admission liaison at [facility] and confirmed pt's bed offer .  DC transportation confirmed for pt with [facility] team updated . No additional DC needs requested by medical team or identified by CM at this time .    Final next level of care: Skilled Nursing Facility Barriers to Discharge: No Barriers Identified   Patient Goals and CMS Choice   CMS Medicare.gov Compare Post Acute Care list provided to:: Other (Comment Required) (Family) Choice offered to / list presented to : Patient (Family)      Discharge Placement PASRR number recieved: 12/30/23 Existing PASRR number confirmed : 12/30/23          Patient chooses bed at: [facility]  Patient to be transferred to facility by: Ambulance Name of family member notified: Corean Friend, sister [phone] Patient and family notified of of transfer: 01/02/24  Discharge Plan and Services SNF or Rehab       Social Drivers of Health (SDOH) Interventions SDOH Screenings   Food Insecurity: No Food Insecurity (12/29/2023)  Housing: Low Risk  (12/29/2023)  Transportation Needs: No Transportation Needs (12/29/2023)  Utilities: Not At Risk (12/29/2023)  Alcohol Screen: Low Risk  (03/19/2023)  Depression (PHQ2-9): Medium Risk (10/29/2023)  Financial Resource Strain: Low Risk  (03/19/2023)  Physical Activity: Inactive (03/19/2023)  Social Connections: Moderately Integrated (12/29/2023)  Stress: No Stress Concern Present (03/19/2023)  Tobacco Use: Low Risk  (12/28/2023)     Readmission Risk Interventions     No data to display

## 2024-01-02 NOTE — Plan of Care (Signed)

## 2024-01-03 DIAGNOSIS — S82201D Unspecified fracture of shaft of right tibia, subsequent encounter for closed fracture with routine healing: Secondary | ICD-10-CM | POA: Diagnosis not present

## 2024-01-04 DIAGNOSIS — M545 Low back pain, unspecified: Secondary | ICD-10-CM | POA: Diagnosis not present

## 2024-01-04 DIAGNOSIS — S82201D Unspecified fracture of shaft of right tibia, subsequent encounter for closed fracture with routine healing: Secondary | ICD-10-CM | POA: Diagnosis not present

## 2024-01-04 DIAGNOSIS — F0390 Unspecified dementia without behavioral disturbance: Secondary | ICD-10-CM | POA: Diagnosis not present

## 2024-01-07 DIAGNOSIS — S82201D Unspecified fracture of shaft of right tibia, subsequent encounter for closed fracture with routine healing: Secondary | ICD-10-CM | POA: Diagnosis not present

## 2024-01-07 DIAGNOSIS — F0390 Unspecified dementia without behavioral disturbance: Secondary | ICD-10-CM | POA: Diagnosis not present

## 2024-01-10 ENCOUNTER — Ambulatory Visit (INDEPENDENT_AMBULATORY_CARE_PROVIDER_SITE_OTHER): Admitting: Nurse Practitioner

## 2024-01-10 ENCOUNTER — Encounter (INDEPENDENT_AMBULATORY_CARE_PROVIDER_SITE_OTHER)

## 2024-01-10 DIAGNOSIS — F0390 Unspecified dementia without behavioral disturbance: Secondary | ICD-10-CM | POA: Diagnosis not present

## 2024-01-10 DIAGNOSIS — S82201D Unspecified fracture of shaft of right tibia, subsequent encounter for closed fracture with routine healing: Secondary | ICD-10-CM | POA: Diagnosis not present

## 2024-01-14 DIAGNOSIS — F330 Major depressive disorder, recurrent, mild: Secondary | ICD-10-CM | POA: Diagnosis not present

## 2024-01-14 DIAGNOSIS — S82201D Unspecified fracture of shaft of right tibia, subsequent encounter for closed fracture with routine healing: Secondary | ICD-10-CM | POA: Diagnosis not present

## 2024-01-14 DIAGNOSIS — F0390 Unspecified dementia without behavioral disturbance: Secondary | ICD-10-CM | POA: Diagnosis not present

## 2024-01-14 DIAGNOSIS — I951 Orthostatic hypotension: Secondary | ICD-10-CM | POA: Diagnosis not present

## 2024-01-17 DIAGNOSIS — S82144D Nondisplaced bicondylar fracture of right tibia, subsequent encounter for closed fracture with routine healing: Secondary | ICD-10-CM | POA: Diagnosis not present

## 2024-01-17 DIAGNOSIS — M898X6 Other specified disorders of bone, lower leg: Secondary | ICD-10-CM | POA: Diagnosis not present

## 2024-01-18 DIAGNOSIS — S82201D Unspecified fracture of shaft of right tibia, subsequent encounter for closed fracture with routine healing: Secondary | ICD-10-CM | POA: Diagnosis not present

## 2024-01-18 DIAGNOSIS — F0390 Unspecified dementia without behavioral disturbance: Secondary | ICD-10-CM | POA: Diagnosis not present

## 2024-01-18 DIAGNOSIS — I951 Orthostatic hypotension: Secondary | ICD-10-CM | POA: Diagnosis not present

## 2024-01-24 DIAGNOSIS — M25511 Pain in right shoulder: Secondary | ICD-10-CM | POA: Diagnosis not present

## 2024-01-24 DIAGNOSIS — S82201D Unspecified fracture of shaft of right tibia, subsequent encounter for closed fracture with routine healing: Secondary | ICD-10-CM | POA: Diagnosis not present

## 2024-02-04 DIAGNOSIS — S82201D Unspecified fracture of shaft of right tibia, subsequent encounter for closed fracture with routine healing: Secondary | ICD-10-CM | POA: Diagnosis not present

## 2024-02-04 DIAGNOSIS — F0390 Unspecified dementia without behavioral disturbance: Secondary | ICD-10-CM | POA: Diagnosis not present

## 2024-02-04 DIAGNOSIS — I951 Orthostatic hypotension: Secondary | ICD-10-CM | POA: Diagnosis not present

## 2024-02-06 ENCOUNTER — Ambulatory Visit: Payer: Medicare Other

## 2024-02-07 DIAGNOSIS — E559 Vitamin D deficiency, unspecified: Secondary | ICD-10-CM | POA: Diagnosis not present

## 2024-02-07 DIAGNOSIS — F0390 Unspecified dementia without behavioral disturbance: Secondary | ICD-10-CM | POA: Diagnosis not present

## 2024-02-07 DIAGNOSIS — S82201D Unspecified fracture of shaft of right tibia, subsequent encounter for closed fracture with routine healing: Secondary | ICD-10-CM | POA: Diagnosis not present

## 2024-02-07 DIAGNOSIS — I951 Orthostatic hypotension: Secondary | ICD-10-CM | POA: Diagnosis not present

## 2024-02-07 DIAGNOSIS — S82145D Nondisplaced bicondylar fracture of left tibia, subsequent encounter for closed fracture with routine healing: Secondary | ICD-10-CM | POA: Diagnosis not present

## 2024-03-10 ENCOUNTER — Telehealth: Payer: Self-pay

## 2024-03-10 NOTE — Transitions of Care (Post Inpatient/ED Visit) (Signed)
 "  03/10/2024  Name: Sierra Kaiser MRN: [id number] DOB: 1933-03-16  Today's TOC FU Call Status: Today's TOC FU Call Status:: Successful TOC FU Call Completed TOC FU Call Complete Date: 03/10/24  Patient's Name and Date of Birth confirmed. Name, DOB  Transition Care Management Follow-up Telephone Call Date of Discharge: 03/05/24 Discharge Facility: Other ([facility]) Name of Other ([facility]) Discharge Facility: [facility] Type of Discharge: Inpatient Admission Primary Inpatient Discharge Diagnosis:: fracture leg How have you been since you were released from the hospital?: Same Any questions or concerns?: No  Items Reviewed: Did you receive and understand the discharge instructions provided?: Yes Medications obtained,verified, and reconciled?: Yes (Medications Reviewed) Any new allergies since your discharge?: No Dietary orders reviewed?: Yes Do you have support at home?:  (went to [facility])  Medications Reviewed Today: Medications Reviewed Today     Reviewed by Emmitt Pan, LPN (Licensed Practical Nurse) on 03/10/24 at [phone]  Med List Status: <None>   Medication Order Taking? Sig Documenting Provider Last Dose Status Informant  acetaminophen  (TYLENOL ) 325 MG tablet [id number] Yes Take 2 tablets (650 mg total) by mouth every 6 (six) hours as needed for mild pain (pain score 1-3), fever or moderate pain (pain score 4-6) (or Fever >/= 101). Jerelene Critchley, MD  Active   Cetirizine  HCl (ZYRTEC  PO) [id number] Yes Take by mouth. [provider]  Active Self, Pharmacy Records  Cholecalciferol  (VITAMIN D3) 25 MCG (1000 UT) CAPS [id number] Yes Take 1 capsule (1,000 Units total) by mouth daily. Rilla Baller, MD  Active Self, Pharmacy Records  divalproex  (DEPAKOTE ) 125 MG DR tablet [id number] Yes Take 1 tablet (125 mg total) by mouth at bedtime. Through neurology Dr Lane Rilla Baller, MD  Active Self, Pharmacy Records  donepezil  (ARICEPT ) 10 MG tablet [id number] Yes  Take 1 tablet (10 mg total) by mouth at bedtime. Rilla Baller, MD  Active Self, Pharmacy Records  ezetimibe  (ZETIA ) 10 MG tablet [id number]  Take 1 tablet (10 mg total) by mouth daily.  Patient not taking: Reported on 03/10/2024   Rilla Baller, MD  Active Self, Pharmacy Records  FLUoxetine  (PROZAC ) 10 MG capsule [id number] Yes Take 1 capsule (10 mg total) by mouth daily. Rilla Baller, MD  Active Self, Pharmacy Records  fluticasone  (FLONASE ) 50 MCG/ACT nasal spray [id number] Yes Place 2 sprays into both nostrils daily. Rilla Baller, MD  Active Self, Pharmacy Records  meclizine  (ANTIVERT ) 25 MG tablet [id number] Yes Take 0.5-1 tablets (12.5-25 mg total) by mouth 2 (two) times daily as needed for dizziness. Rilla Baller, MD  Active Self, Pharmacy Records  midodrine  (PROAMATINE ) 2.5 MG tablet [id number] Yes Take 2.5 mg the morning. DO NOT LAY DOWN 4 hours after taking medication [provider]  Active Self, Pharmacy Records  oxyCODONE  (OXY IR/ROXICODONE ) 5 MG immediate release tablet [id number] Yes Take 0.5-1 tablets (2.5-5 mg total) by mouth every 6 (six) hours as needed for severe pain (pain score 7-10) or moderate pain (pain score 4-6). Jerelene Critchley, MD  Active   rosuvastatin  (CRESTOR ) 20 MG tablet [id number] Yes Take 1 tablet (20 mg total) by mouth every other day. Rilla Baller, MD  Active Self, Pharmacy Records  XARELTO  15 MG TABS tablet [id number] Yes TAKE 1 TABLET BY MOUTH ONCE DAILY WITH SUPPER Gollan, Timothy J, MD  Active Self, Pharmacy Records            Home Care and Equipment/Supplies: Were Home Health Services Ordered?: NA Any new equipment or medical supplies ordered?: NA  Functional Questionnaire: Do you  need assistance with bathing/showering or dressing?: Yes Do you need assistance with meal preparation?: Yes Do you have difficulty maintaining continence: Yes Do you need assistance with getting out of bed/getting out of a chair/moving?: Yes Do  you have difficulty managing or taking your medications?: Yes  Follow up appointments reviewed: PCP Follow-up appointment confirmed?: No (declined in [place]) MD Provider Line Number:[phone] Given: No Specialist Hospital Follow-up appointment confirmed?: NA Do you need transportation to your follow-up appointment?: No Do you understand care options if your condition(s) worsen?: Yes-patient verbalized understanding  [facility]  SIGNATURE Julian Lemmings, LPN [facility] Nurse Health Advisor Direct Dial [phone]  "

## 2024-03-19 ENCOUNTER — Ambulatory Visit: Payer: HMO

## 2024-03-28 ENCOUNTER — Telehealth: Payer: Self-pay

## 2024-03-28 NOTE — Telephone Encounter (Signed)
 Corean (DPR signed) said that pt was presently at [facility] rehab after falling last  Oct 2025 and breaking her leg and Corean wanted to know the last time pt had Vitamin b 12 ck ands info was given and Corean was going to speak with physician at [facility] to get vit b 12 level cked. And see if pt needs  to be on b12 injections while at [facility]. Corean said when pt gets discharged from [facility] she will call for appt with Dr KANDICE. Sending note to Dr KANDICE as RICK.

## 2024-03-28 NOTE — Telephone Encounter (Signed)
 Noted thank you for checking in on patient.

## 2024-05-07 ENCOUNTER — Ambulatory Visit

## 2024-08-06 ENCOUNTER — Ambulatory Visit

## 2024-11-05 ENCOUNTER — Ambulatory Visit

## 2025-02-04 ENCOUNTER — Ambulatory Visit
